# Patient Record
Sex: Male | Born: 1955
Health system: Southern US, Community
[De-identification: ages and names within clinical notes are randomized; demographics above are authoritative.]

## PROBLEM LIST (undated history)

## (undated) DIAGNOSIS — N289 Disorder of kidney and ureter, unspecified: Secondary | ICD-10-CM

## (undated) DIAGNOSIS — F32A Depression, unspecified: Secondary | ICD-10-CM

## (undated) DIAGNOSIS — N319 Neuromuscular dysfunction of bladder, unspecified: Secondary | ICD-10-CM

## (undated) DIAGNOSIS — H547 Unspecified visual loss: Secondary | ICD-10-CM

## (undated) DIAGNOSIS — I10 Essential (primary) hypertension: Secondary | ICD-10-CM

## (undated) DIAGNOSIS — G629 Polyneuropathy, unspecified: Secondary | ICD-10-CM

## (undated) DIAGNOSIS — E119 Type 2 diabetes mellitus without complications: Secondary | ICD-10-CM

## (undated) DIAGNOSIS — E785 Hyperlipidemia, unspecified: Secondary | ICD-10-CM

## (undated) DIAGNOSIS — J189 Pneumonia, unspecified organism: Secondary | ICD-10-CM

## (undated) DIAGNOSIS — N400 Enlarged prostate without lower urinary tract symptoms: Secondary | ICD-10-CM

---

## 2011-04-27 DIAGNOSIS — E119 Type 2 diabetes mellitus without complications: Secondary | ICD-10-CM | POA: Diagnosis not present

## 2011-05-04 DIAGNOSIS — I10 Essential (primary) hypertension: Secondary | ICD-10-CM | POA: Diagnosis not present

## 2011-05-04 DIAGNOSIS — E1165 Type 2 diabetes mellitus with hyperglycemia: Secondary | ICD-10-CM | POA: Diagnosis not present

## 2011-05-04 DIAGNOSIS — E785 Hyperlipidemia, unspecified: Secondary | ICD-10-CM | POA: Diagnosis not present

## 2011-05-04 DIAGNOSIS — E1129 Type 2 diabetes mellitus with other diabetic kidney complication: Secondary | ICD-10-CM | POA: Diagnosis not present

## 2011-08-30 DIAGNOSIS — E119 Type 2 diabetes mellitus without complications: Secondary | ICD-10-CM | POA: Diagnosis not present

## 2011-09-06 DIAGNOSIS — E1129 Type 2 diabetes mellitus with other diabetic kidney complication: Secondary | ICD-10-CM | POA: Diagnosis not present

## 2011-09-06 DIAGNOSIS — E785 Hyperlipidemia, unspecified: Secondary | ICD-10-CM | POA: Diagnosis not present

## 2011-09-06 DIAGNOSIS — I10 Essential (primary) hypertension: Secondary | ICD-10-CM | POA: Diagnosis not present

## 2011-09-06 DIAGNOSIS — F17200 Nicotine dependence, unspecified, uncomplicated: Secondary | ICD-10-CM | POA: Diagnosis not present

## 2012-01-31 DIAGNOSIS — Z0000 Encounter for general adult medical examination without abnormal findings: Secondary | ICD-10-CM | POA: Diagnosis not present

## 2012-01-31 DIAGNOSIS — Z23 Encounter for immunization: Secondary | ICD-10-CM | POA: Diagnosis not present

## 2012-01-31 DIAGNOSIS — Z1331 Encounter for screening for depression: Secondary | ICD-10-CM | POA: Diagnosis not present

## 2012-01-31 DIAGNOSIS — I10 Essential (primary) hypertension: Secondary | ICD-10-CM | POA: Diagnosis not present

## 2012-01-31 DIAGNOSIS — Z125 Encounter for screening for malignant neoplasm of prostate: Secondary | ICD-10-CM | POA: Diagnosis not present

## 2012-01-31 DIAGNOSIS — E1129 Type 2 diabetes mellitus with other diabetic kidney complication: Secondary | ICD-10-CM | POA: Diagnosis not present

## 2012-01-31 DIAGNOSIS — E785 Hyperlipidemia, unspecified: Secondary | ICD-10-CM | POA: Diagnosis not present

## 2012-01-31 DIAGNOSIS — E559 Vitamin D deficiency, unspecified: Secondary | ICD-10-CM | POA: Diagnosis not present

## 2012-02-07 DIAGNOSIS — I10 Essential (primary) hypertension: Secondary | ICD-10-CM | POA: Diagnosis not present

## 2012-02-07 DIAGNOSIS — N182 Chronic kidney disease, stage 2 (mild): Secondary | ICD-10-CM | POA: Diagnosis not present

## 2012-02-07 DIAGNOSIS — E785 Hyperlipidemia, unspecified: Secondary | ICD-10-CM | POA: Diagnosis not present

## 2012-02-07 DIAGNOSIS — E1129 Type 2 diabetes mellitus with other diabetic kidney complication: Secondary | ICD-10-CM | POA: Diagnosis not present

## 2012-08-28 DIAGNOSIS — E1129 Type 2 diabetes mellitus with other diabetic kidney complication: Secondary | ICD-10-CM | POA: Diagnosis not present

## 2012-08-28 DIAGNOSIS — E785 Hyperlipidemia, unspecified: Secondary | ICD-10-CM | POA: Diagnosis not present

## 2012-08-28 DIAGNOSIS — I10 Essential (primary) hypertension: Secondary | ICD-10-CM | POA: Diagnosis not present

## 2012-08-28 DIAGNOSIS — E559 Vitamin D deficiency, unspecified: Secondary | ICD-10-CM | POA: Diagnosis not present

## 2012-09-04 DIAGNOSIS — N182 Chronic kidney disease, stage 2 (mild): Secondary | ICD-10-CM | POA: Diagnosis not present

## 2012-09-04 DIAGNOSIS — E1129 Type 2 diabetes mellitus with other diabetic kidney complication: Secondary | ICD-10-CM | POA: Diagnosis not present

## 2012-09-04 DIAGNOSIS — I10 Essential (primary) hypertension: Secondary | ICD-10-CM | POA: Diagnosis not present

## 2012-09-04 DIAGNOSIS — E785 Hyperlipidemia, unspecified: Secondary | ICD-10-CM | POA: Diagnosis not present

## 2013-01-16 DIAGNOSIS — Z23 Encounter for immunization: Secondary | ICD-10-CM | POA: Diagnosis not present

## 2013-03-07 DIAGNOSIS — Z0000 Encounter for general adult medical examination without abnormal findings: Secondary | ICD-10-CM | POA: Diagnosis not present

## 2013-03-07 DIAGNOSIS — E559 Vitamin D deficiency, unspecified: Secondary | ICD-10-CM | POA: Diagnosis not present

## 2013-03-07 DIAGNOSIS — I10 Essential (primary) hypertension: Secondary | ICD-10-CM | POA: Diagnosis not present

## 2013-03-07 DIAGNOSIS — Z1331 Encounter for screening for depression: Secondary | ICD-10-CM | POA: Diagnosis not present

## 2013-03-07 DIAGNOSIS — E1129 Type 2 diabetes mellitus with other diabetic kidney complication: Secondary | ICD-10-CM | POA: Diagnosis not present

## 2013-03-14 DIAGNOSIS — E1129 Type 2 diabetes mellitus with other diabetic kidney complication: Secondary | ICD-10-CM | POA: Diagnosis not present

## 2013-03-14 DIAGNOSIS — H524 Presbyopia: Secondary | ICD-10-CM | POA: Diagnosis not present

## 2013-03-14 DIAGNOSIS — E785 Hyperlipidemia, unspecified: Secondary | ICD-10-CM | POA: Diagnosis not present

## 2013-03-14 DIAGNOSIS — N182 Chronic kidney disease, stage 2 (mild): Secondary | ICD-10-CM | POA: Diagnosis not present

## 2013-03-14 DIAGNOSIS — H52229 Regular astigmatism, unspecified eye: Secondary | ICD-10-CM | POA: Diagnosis not present

## 2013-03-14 DIAGNOSIS — I10 Essential (primary) hypertension: Secondary | ICD-10-CM | POA: Diagnosis not present

## 2013-03-14 DIAGNOSIS — H25099 Other age-related incipient cataract, unspecified eye: Secondary | ICD-10-CM | POA: Diagnosis not present

## 2013-03-14 DIAGNOSIS — H5200 Hypermetropia, unspecified eye: Secondary | ICD-10-CM | POA: Diagnosis not present

## 2013-06-12 DIAGNOSIS — E1129 Type 2 diabetes mellitus with other diabetic kidney complication: Secondary | ICD-10-CM | POA: Diagnosis not present

## 2013-06-12 DIAGNOSIS — E785 Hyperlipidemia, unspecified: Secondary | ICD-10-CM | POA: Diagnosis not present

## 2013-06-12 DIAGNOSIS — I10 Essential (primary) hypertension: Secondary | ICD-10-CM | POA: Diagnosis not present

## 2013-06-12 DIAGNOSIS — E1165 Type 2 diabetes mellitus with hyperglycemia: Secondary | ICD-10-CM | POA: Diagnosis not present

## 2013-06-12 DIAGNOSIS — Z125 Encounter for screening for malignant neoplasm of prostate: Secondary | ICD-10-CM | POA: Diagnosis not present

## 2013-06-19 DIAGNOSIS — E785 Hyperlipidemia, unspecified: Secondary | ICD-10-CM | POA: Diagnosis not present

## 2013-06-19 DIAGNOSIS — N182 Chronic kidney disease, stage 2 (mild): Secondary | ICD-10-CM | POA: Diagnosis not present

## 2013-06-19 DIAGNOSIS — E1129 Type 2 diabetes mellitus with other diabetic kidney complication: Secondary | ICD-10-CM | POA: Diagnosis not present

## 2013-06-19 DIAGNOSIS — I10 Essential (primary) hypertension: Secondary | ICD-10-CM | POA: Diagnosis not present

## 2013-09-19 DIAGNOSIS — E1129 Type 2 diabetes mellitus with other diabetic kidney complication: Secondary | ICD-10-CM | POA: Diagnosis not present

## 2013-09-19 DIAGNOSIS — I10 Essential (primary) hypertension: Secondary | ICD-10-CM | POA: Diagnosis not present

## 2013-09-19 DIAGNOSIS — E559 Vitamin D deficiency, unspecified: Secondary | ICD-10-CM | POA: Diagnosis not present

## 2013-09-19 DIAGNOSIS — E1165 Type 2 diabetes mellitus with hyperglycemia: Secondary | ICD-10-CM | POA: Diagnosis not present

## 2013-09-25 DIAGNOSIS — I10 Essential (primary) hypertension: Secondary | ICD-10-CM | POA: Diagnosis not present

## 2013-09-25 DIAGNOSIS — E1129 Type 2 diabetes mellitus with other diabetic kidney complication: Secondary | ICD-10-CM | POA: Diagnosis not present

## 2013-09-25 DIAGNOSIS — E785 Hyperlipidemia, unspecified: Secondary | ICD-10-CM | POA: Diagnosis not present

## 2013-09-25 DIAGNOSIS — N182 Chronic kidney disease, stage 2 (mild): Secondary | ICD-10-CM | POA: Diagnosis not present

## 2013-12-31 DIAGNOSIS — I10 Essential (primary) hypertension: Secondary | ICD-10-CM | POA: Diagnosis not present

## 2013-12-31 DIAGNOSIS — E1129 Type 2 diabetes mellitus with other diabetic kidney complication: Secondary | ICD-10-CM | POA: Diagnosis not present

## 2013-12-31 DIAGNOSIS — E559 Vitamin D deficiency, unspecified: Secondary | ICD-10-CM | POA: Diagnosis not present

## 2014-01-07 DIAGNOSIS — E1121 Type 2 diabetes mellitus with diabetic nephropathy: Secondary | ICD-10-CM | POA: Diagnosis not present

## 2014-01-07 DIAGNOSIS — I10 Essential (primary) hypertension: Secondary | ICD-10-CM | POA: Diagnosis not present

## 2014-01-07 DIAGNOSIS — E784 Other hyperlipidemia: Secondary | ICD-10-CM | POA: Diagnosis not present

## 2014-01-07 DIAGNOSIS — N182 Chronic kidney disease, stage 2 (mild): Secondary | ICD-10-CM | POA: Diagnosis not present

## 2014-01-07 DIAGNOSIS — Z23 Encounter for immunization: Secondary | ICD-10-CM | POA: Diagnosis not present

## 2014-04-07 DIAGNOSIS — Z0000 Encounter for general adult medical examination without abnormal findings: Secondary | ICD-10-CM | POA: Diagnosis not present

## 2014-04-07 DIAGNOSIS — E118 Type 2 diabetes mellitus with unspecified complications: Secondary | ICD-10-CM | POA: Diagnosis not present

## 2014-04-07 DIAGNOSIS — E1329 Other specified diabetes mellitus with other diabetic kidney complication: Secondary | ICD-10-CM | POA: Diagnosis not present

## 2014-04-07 DIAGNOSIS — Z1389 Encounter for screening for other disorder: Secondary | ICD-10-CM | POA: Diagnosis not present

## 2014-04-07 DIAGNOSIS — I10 Essential (primary) hypertension: Secondary | ICD-10-CM | POA: Diagnosis not present

## 2014-04-07 DIAGNOSIS — E559 Vitamin D deficiency, unspecified: Secondary | ICD-10-CM | POA: Diagnosis not present

## 2014-04-07 DIAGNOSIS — Z125 Encounter for screening for malignant neoplasm of prostate: Secondary | ICD-10-CM | POA: Diagnosis not present

## 2014-04-14 DIAGNOSIS — E1121 Type 2 diabetes mellitus with diabetic nephropathy: Secondary | ICD-10-CM | POA: Diagnosis not present

## 2014-04-14 DIAGNOSIS — E559 Vitamin D deficiency, unspecified: Secondary | ICD-10-CM | POA: Diagnosis not present

## 2014-04-14 DIAGNOSIS — E785 Hyperlipidemia, unspecified: Secondary | ICD-10-CM | POA: Diagnosis not present

## 2014-04-14 DIAGNOSIS — N182 Chronic kidney disease, stage 2 (mild): Secondary | ICD-10-CM | POA: Diagnosis not present

## 2014-04-14 DIAGNOSIS — I10 Essential (primary) hypertension: Secondary | ICD-10-CM | POA: Diagnosis not present

## 2014-07-14 DIAGNOSIS — I10 Essential (primary) hypertension: Secondary | ICD-10-CM | POA: Diagnosis not present

## 2014-07-14 DIAGNOSIS — E559 Vitamin D deficiency, unspecified: Secondary | ICD-10-CM | POA: Diagnosis not present

## 2014-07-14 DIAGNOSIS — E1121 Type 2 diabetes mellitus with diabetic nephropathy: Secondary | ICD-10-CM | POA: Diagnosis not present

## 2014-07-21 DIAGNOSIS — E1121 Type 2 diabetes mellitus with diabetic nephropathy: Secondary | ICD-10-CM | POA: Diagnosis not present

## 2014-07-21 DIAGNOSIS — N182 Chronic kidney disease, stage 2 (mild): Secondary | ICD-10-CM | POA: Diagnosis not present

## 2014-07-21 DIAGNOSIS — E785 Hyperlipidemia, unspecified: Secondary | ICD-10-CM | POA: Diagnosis not present

## 2014-07-21 DIAGNOSIS — I10 Essential (primary) hypertension: Secondary | ICD-10-CM | POA: Diagnosis not present

## 2015-01-19 DIAGNOSIS — E1121 Type 2 diabetes mellitus with diabetic nephropathy: Secondary | ICD-10-CM | POA: Diagnosis not present

## 2015-01-19 DIAGNOSIS — E785 Hyperlipidemia, unspecified: Secondary | ICD-10-CM | POA: Diagnosis not present

## 2015-01-19 DIAGNOSIS — I10 Essential (primary) hypertension: Secondary | ICD-10-CM | POA: Diagnosis not present

## 2015-01-19 DIAGNOSIS — E559 Vitamin D deficiency, unspecified: Secondary | ICD-10-CM | POA: Diagnosis not present

## 2015-01-26 DIAGNOSIS — E559 Vitamin D deficiency, unspecified: Secondary | ICD-10-CM | POA: Diagnosis not present

## 2015-01-26 DIAGNOSIS — E1122 Type 2 diabetes mellitus with diabetic chronic kidney disease: Secondary | ICD-10-CM | POA: Diagnosis not present

## 2015-01-26 DIAGNOSIS — N182 Chronic kidney disease, stage 2 (mild): Secondary | ICD-10-CM | POA: Diagnosis not present

## 2015-01-26 DIAGNOSIS — E785 Hyperlipidemia, unspecified: Secondary | ICD-10-CM | POA: Diagnosis not present

## 2015-05-13 DIAGNOSIS — E119 Type 2 diabetes mellitus without complications: Secondary | ICD-10-CM | POA: Diagnosis not present

## 2015-05-13 DIAGNOSIS — Z7984 Long term (current) use of oral hypoglycemic drugs: Secondary | ICD-10-CM | POA: Diagnosis not present

## 2015-05-13 DIAGNOSIS — H4720 Unspecified optic atrophy: Secondary | ICD-10-CM | POA: Diagnosis not present

## 2015-05-13 DIAGNOSIS — H524 Presbyopia: Secondary | ICD-10-CM | POA: Diagnosis not present

## 2015-05-13 DIAGNOSIS — H5203 Hypermetropia, bilateral: Secondary | ICD-10-CM | POA: Diagnosis not present

## 2015-05-13 DIAGNOSIS — H52223 Regular astigmatism, bilateral: Secondary | ICD-10-CM | POA: Diagnosis not present

## 2015-07-06 DIAGNOSIS — E785 Hyperlipidemia, unspecified: Secondary | ICD-10-CM | POA: Diagnosis not present

## 2015-07-06 DIAGNOSIS — Z125 Encounter for screening for malignant neoplasm of prostate: Secondary | ICD-10-CM | POA: Diagnosis not present

## 2015-07-06 DIAGNOSIS — Z23 Encounter for immunization: Secondary | ICD-10-CM | POA: Diagnosis not present

## 2015-07-06 DIAGNOSIS — E559 Vitamin D deficiency, unspecified: Secondary | ICD-10-CM | POA: Diagnosis not present

## 2015-07-06 DIAGNOSIS — Z1389 Encounter for screening for other disorder: Secondary | ICD-10-CM | POA: Diagnosis not present

## 2015-07-06 DIAGNOSIS — E1122 Type 2 diabetes mellitus with diabetic chronic kidney disease: Secondary | ICD-10-CM | POA: Diagnosis not present

## 2015-07-06 DIAGNOSIS — Z0000 Encounter for general adult medical examination without abnormal findings: Secondary | ICD-10-CM | POA: Diagnosis not present

## 2015-07-13 DIAGNOSIS — E785 Hyperlipidemia, unspecified: Secondary | ICD-10-CM | POA: Diagnosis not present

## 2015-07-13 DIAGNOSIS — E875 Hyperkalemia: Secondary | ICD-10-CM | POA: Diagnosis not present

## 2015-07-13 DIAGNOSIS — E559 Vitamin D deficiency, unspecified: Secondary | ICD-10-CM | POA: Diagnosis not present

## 2015-07-13 DIAGNOSIS — I129 Hypertensive chronic kidney disease with stage 1 through stage 4 chronic kidney disease, or unspecified chronic kidney disease: Secondary | ICD-10-CM | POA: Diagnosis not present

## 2015-07-13 DIAGNOSIS — N182 Chronic kidney disease, stage 2 (mild): Secondary | ICD-10-CM | POA: Diagnosis not present

## 2015-07-13 DIAGNOSIS — F17211 Nicotine dependence, cigarettes, in remission: Secondary | ICD-10-CM | POA: Diagnosis not present

## 2015-07-13 DIAGNOSIS — E1122 Type 2 diabetes mellitus with diabetic chronic kidney disease: Secondary | ICD-10-CM | POA: Diagnosis not present

## 2015-10-05 DIAGNOSIS — E559 Vitamin D deficiency, unspecified: Secondary | ICD-10-CM | POA: Diagnosis not present

## 2015-10-05 DIAGNOSIS — I129 Hypertensive chronic kidney disease with stage 1 through stage 4 chronic kidney disease, or unspecified chronic kidney disease: Secondary | ICD-10-CM | POA: Diagnosis not present

## 2015-10-05 DIAGNOSIS — E1122 Type 2 diabetes mellitus with diabetic chronic kidney disease: Secondary | ICD-10-CM | POA: Diagnosis not present

## 2015-10-12 DIAGNOSIS — I129 Hypertensive chronic kidney disease with stage 1 through stage 4 chronic kidney disease, or unspecified chronic kidney disease: Secondary | ICD-10-CM | POA: Diagnosis not present

## 2015-10-12 DIAGNOSIS — E785 Hyperlipidemia, unspecified: Secondary | ICD-10-CM | POA: Diagnosis not present

## 2015-10-12 DIAGNOSIS — E1122 Type 2 diabetes mellitus with diabetic chronic kidney disease: Secondary | ICD-10-CM | POA: Diagnosis not present

## 2015-10-12 DIAGNOSIS — N182 Chronic kidney disease, stage 2 (mild): Secondary | ICD-10-CM | POA: Diagnosis not present

## 2016-01-11 DIAGNOSIS — I10 Essential (primary) hypertension: Secondary | ICD-10-CM | POA: Diagnosis not present

## 2016-01-11 DIAGNOSIS — E1122 Type 2 diabetes mellitus with diabetic chronic kidney disease: Secondary | ICD-10-CM | POA: Diagnosis not present

## 2016-01-11 DIAGNOSIS — E559 Vitamin D deficiency, unspecified: Secondary | ICD-10-CM | POA: Diagnosis not present

## 2016-01-11 DIAGNOSIS — E785 Hyperlipidemia, unspecified: Secondary | ICD-10-CM | POA: Diagnosis not present

## 2016-01-11 DIAGNOSIS — I129 Hypertensive chronic kidney disease with stage 1 through stage 4 chronic kidney disease, or unspecified chronic kidney disease: Secondary | ICD-10-CM | POA: Diagnosis not present

## 2016-01-18 DIAGNOSIS — E559 Vitamin D deficiency, unspecified: Secondary | ICD-10-CM | POA: Diagnosis not present

## 2016-01-18 DIAGNOSIS — I129 Hypertensive chronic kidney disease with stage 1 through stage 4 chronic kidney disease, or unspecified chronic kidney disease: Secondary | ICD-10-CM | POA: Diagnosis not present

## 2016-01-18 DIAGNOSIS — E1122 Type 2 diabetes mellitus with diabetic chronic kidney disease: Secondary | ICD-10-CM | POA: Diagnosis not present

## 2016-01-18 DIAGNOSIS — Z23 Encounter for immunization: Secondary | ICD-10-CM | POA: Diagnosis not present

## 2016-04-12 DIAGNOSIS — E785 Hyperlipidemia, unspecified: Secondary | ICD-10-CM | POA: Diagnosis not present

## 2016-04-12 DIAGNOSIS — E1122 Type 2 diabetes mellitus with diabetic chronic kidney disease: Secondary | ICD-10-CM | POA: Diagnosis not present

## 2016-04-12 DIAGNOSIS — I10 Essential (primary) hypertension: Secondary | ICD-10-CM | POA: Diagnosis not present

## 2016-04-12 DIAGNOSIS — E559 Vitamin D deficiency, unspecified: Secondary | ICD-10-CM | POA: Diagnosis not present

## 2016-04-19 DIAGNOSIS — I10 Essential (primary) hypertension: Secondary | ICD-10-CM | POA: Diagnosis not present

## 2016-04-19 DIAGNOSIS — E875 Hyperkalemia: Secondary | ICD-10-CM | POA: Diagnosis not present

## 2016-04-19 DIAGNOSIS — E1165 Type 2 diabetes mellitus with hyperglycemia: Secondary | ICD-10-CM | POA: Diagnosis not present

## 2016-04-19 DIAGNOSIS — E7800 Pure hypercholesterolemia, unspecified: Secondary | ICD-10-CM | POA: Diagnosis not present

## 2016-08-23 DIAGNOSIS — E559 Vitamin D deficiency, unspecified: Secondary | ICD-10-CM | POA: Diagnosis not present

## 2016-08-23 DIAGNOSIS — I10 Essential (primary) hypertension: Secondary | ICD-10-CM | POA: Diagnosis not present

## 2016-08-23 DIAGNOSIS — E785 Hyperlipidemia, unspecified: Secondary | ICD-10-CM | POA: Diagnosis not present

## 2016-08-23 DIAGNOSIS — I129 Hypertensive chronic kidney disease with stage 1 through stage 4 chronic kidney disease, or unspecified chronic kidney disease: Secondary | ICD-10-CM | POA: Diagnosis not present

## 2016-08-23 DIAGNOSIS — E1122 Type 2 diabetes mellitus with diabetic chronic kidney disease: Secondary | ICD-10-CM | POA: Diagnosis not present

## 2016-08-31 DIAGNOSIS — E559 Vitamin D deficiency, unspecified: Secondary | ICD-10-CM | POA: Diagnosis not present

## 2016-08-31 DIAGNOSIS — R000 Tachycardia, unspecified: Secondary | ICD-10-CM | POA: Diagnosis not present

## 2016-08-31 DIAGNOSIS — I10 Essential (primary) hypertension: Secondary | ICD-10-CM | POA: Diagnosis not present

## 2016-08-31 DIAGNOSIS — E119 Type 2 diabetes mellitus without complications: Secondary | ICD-10-CM | POA: Diagnosis not present

## 2016-08-31 DIAGNOSIS — E7800 Pure hypercholesterolemia, unspecified: Secondary | ICD-10-CM | POA: Diagnosis not present

## 2017-01-10 DIAGNOSIS — Z23 Encounter for immunization: Secondary | ICD-10-CM | POA: Diagnosis not present

## 2017-02-23 DIAGNOSIS — E1122 Type 2 diabetes mellitus with diabetic chronic kidney disease: Secondary | ICD-10-CM | POA: Diagnosis not present

## 2017-02-23 DIAGNOSIS — E7800 Pure hypercholesterolemia, unspecified: Secondary | ICD-10-CM | POA: Diagnosis not present

## 2017-02-23 DIAGNOSIS — Z125 Encounter for screening for malignant neoplasm of prostate: Secondary | ICD-10-CM | POA: Diagnosis not present

## 2017-02-23 DIAGNOSIS — E119 Type 2 diabetes mellitus without complications: Secondary | ICD-10-CM | POA: Diagnosis not present

## 2017-03-02 DIAGNOSIS — I10 Essential (primary) hypertension: Secondary | ICD-10-CM | POA: Diagnosis not present

## 2017-03-02 DIAGNOSIS — E7800 Pure hypercholesterolemia, unspecified: Secondary | ICD-10-CM | POA: Diagnosis not present

## 2017-03-02 DIAGNOSIS — E119 Type 2 diabetes mellitus without complications: Secondary | ICD-10-CM | POA: Diagnosis not present

## 2017-03-02 DIAGNOSIS — E559 Vitamin D deficiency, unspecified: Secondary | ICD-10-CM | POA: Diagnosis not present

## 2017-08-28 DIAGNOSIS — E119 Type 2 diabetes mellitus without complications: Secondary | ICD-10-CM | POA: Diagnosis not present

## 2017-08-28 DIAGNOSIS — I10 Essential (primary) hypertension: Secondary | ICD-10-CM | POA: Diagnosis not present

## 2017-08-28 DIAGNOSIS — E1122 Type 2 diabetes mellitus with diabetic chronic kidney disease: Secondary | ICD-10-CM | POA: Diagnosis not present

## 2017-08-28 DIAGNOSIS — Z125 Encounter for screening for malignant neoplasm of prostate: Secondary | ICD-10-CM | POA: Diagnosis not present

## 2017-08-28 DIAGNOSIS — E785 Hyperlipidemia, unspecified: Secondary | ICD-10-CM | POA: Diagnosis not present

## 2017-08-31 DIAGNOSIS — E559 Vitamin D deficiency, unspecified: Secondary | ICD-10-CM | POA: Diagnosis not present

## 2017-08-31 DIAGNOSIS — I10 Essential (primary) hypertension: Secondary | ICD-10-CM | POA: Diagnosis not present

## 2017-08-31 DIAGNOSIS — N182 Chronic kidney disease, stage 2 (mild): Secondary | ICD-10-CM | POA: Diagnosis not present

## 2017-08-31 DIAGNOSIS — F17211 Nicotine dependence, cigarettes, in remission: Secondary | ICD-10-CM | POA: Diagnosis not present

## 2017-08-31 DIAGNOSIS — R430 Anosmia: Secondary | ICD-10-CM | POA: Diagnosis not present

## 2017-08-31 DIAGNOSIS — I129 Hypertensive chronic kidney disease with stage 1 through stage 4 chronic kidney disease, or unspecified chronic kidney disease: Secondary | ICD-10-CM | POA: Diagnosis not present

## 2017-08-31 DIAGNOSIS — E1122 Type 2 diabetes mellitus with diabetic chronic kidney disease: Secondary | ICD-10-CM | POA: Diagnosis not present

## 2017-08-31 DIAGNOSIS — E785 Hyperlipidemia, unspecified: Secondary | ICD-10-CM | POA: Diagnosis not present

## 2017-08-31 DIAGNOSIS — R000 Tachycardia, unspecified: Secondary | ICD-10-CM | POA: Diagnosis not present

## 2017-08-31 DIAGNOSIS — H4720 Unspecified optic atrophy: Secondary | ICD-10-CM | POA: Diagnosis not present

## 2017-08-31 DIAGNOSIS — J309 Allergic rhinitis, unspecified: Secondary | ICD-10-CM | POA: Diagnosis not present

## 2017-08-31 DIAGNOSIS — Z0000 Encounter for general adult medical examination without abnormal findings: Secondary | ICD-10-CM | POA: Diagnosis not present

## 2017-10-05 DIAGNOSIS — R350 Frequency of micturition: Secondary | ICD-10-CM | POA: Diagnosis not present

## 2017-10-05 DIAGNOSIS — N401 Enlarged prostate with lower urinary tract symptoms: Secondary | ICD-10-CM | POA: Diagnosis not present

## 2017-10-05 DIAGNOSIS — R972 Elevated prostate specific antigen [PSA]: Secondary | ICD-10-CM | POA: Diagnosis not present

## 2017-10-05 DIAGNOSIS — R3912 Poor urinary stream: Secondary | ICD-10-CM | POA: Diagnosis not present

## 2017-10-05 DIAGNOSIS — R3914 Feeling of incomplete bladder emptying: Secondary | ICD-10-CM | POA: Diagnosis not present

## 2017-11-22 DIAGNOSIS — R3912 Poor urinary stream: Secondary | ICD-10-CM | POA: Diagnosis not present

## 2017-11-22 DIAGNOSIS — R3914 Feeling of incomplete bladder emptying: Secondary | ICD-10-CM | POA: Diagnosis not present

## 2017-11-22 DIAGNOSIS — N401 Enlarged prostate with lower urinary tract symptoms: Secondary | ICD-10-CM | POA: Diagnosis not present

## 2017-11-23 DIAGNOSIS — E1122 Type 2 diabetes mellitus with diabetic chronic kidney disease: Secondary | ICD-10-CM | POA: Diagnosis not present

## 2017-11-23 DIAGNOSIS — I10 Essential (primary) hypertension: Secondary | ICD-10-CM | POA: Diagnosis not present

## 2017-11-30 DIAGNOSIS — Z23 Encounter for immunization: Secondary | ICD-10-CM | POA: Diagnosis not present

## 2017-11-30 DIAGNOSIS — E1122 Type 2 diabetes mellitus with diabetic chronic kidney disease: Secondary | ICD-10-CM | POA: Diagnosis not present

## 2018-03-08 DIAGNOSIS — E1122 Type 2 diabetes mellitus with diabetic chronic kidney disease: Secondary | ICD-10-CM | POA: Diagnosis not present

## 2018-03-08 DIAGNOSIS — E875 Hyperkalemia: Secondary | ICD-10-CM | POA: Diagnosis not present

## 2018-03-08 DIAGNOSIS — Z1159 Encounter for screening for other viral diseases: Secondary | ICD-10-CM | POA: Diagnosis not present

## 2018-03-08 DIAGNOSIS — I129 Hypertensive chronic kidney disease with stage 1 through stage 4 chronic kidney disease, or unspecified chronic kidney disease: Secondary | ICD-10-CM | POA: Diagnosis not present

## 2018-03-12 DIAGNOSIS — I129 Hypertensive chronic kidney disease with stage 1 through stage 4 chronic kidney disease, or unspecified chronic kidney disease: Secondary | ICD-10-CM | POA: Diagnosis not present

## 2018-03-12 DIAGNOSIS — E785 Hyperlipidemia, unspecified: Secondary | ICD-10-CM | POA: Diagnosis not present

## 2018-03-12 DIAGNOSIS — E1122 Type 2 diabetes mellitus with diabetic chronic kidney disease: Secondary | ICD-10-CM | POA: Diagnosis not present

## 2018-03-26 DIAGNOSIS — I129 Hypertensive chronic kidney disease with stage 1 through stage 4 chronic kidney disease, or unspecified chronic kidney disease: Secondary | ICD-10-CM | POA: Diagnosis not present

## 2018-03-26 DIAGNOSIS — E785 Hyperlipidemia, unspecified: Secondary | ICD-10-CM | POA: Diagnosis not present

## 2018-03-26 DIAGNOSIS — E1122 Type 2 diabetes mellitus with diabetic chronic kidney disease: Secondary | ICD-10-CM | POA: Diagnosis not present

## 2018-05-07 DIAGNOSIS — E785 Hyperlipidemia, unspecified: Secondary | ICD-10-CM | POA: Diagnosis not present

## 2018-05-07 DIAGNOSIS — K5900 Constipation, unspecified: Secondary | ICD-10-CM | POA: Diagnosis not present

## 2018-05-07 DIAGNOSIS — E1122 Type 2 diabetes mellitus with diabetic chronic kidney disease: Secondary | ICD-10-CM | POA: Diagnosis not present

## 2018-05-07 DIAGNOSIS — I129 Hypertensive chronic kidney disease with stage 1 through stage 4 chronic kidney disease, or unspecified chronic kidney disease: Secondary | ICD-10-CM | POA: Diagnosis not present

## 2018-10-26 ENCOUNTER — Other Ambulatory Visit: Payer: Self-pay

## 2018-12-21 DIAGNOSIS — Z23 Encounter for immunization: Secondary | ICD-10-CM | POA: Diagnosis not present

## 2019-01-08 DIAGNOSIS — I129 Hypertensive chronic kidney disease with stage 1 through stage 4 chronic kidney disease, or unspecified chronic kidney disease: Secondary | ICD-10-CM | POA: Diagnosis not present

## 2019-01-08 DIAGNOSIS — E559 Vitamin D deficiency, unspecified: Secondary | ICD-10-CM | POA: Diagnosis not present

## 2019-01-08 DIAGNOSIS — I10 Essential (primary) hypertension: Secondary | ICD-10-CM | POA: Diagnosis not present

## 2019-01-08 DIAGNOSIS — E1122 Type 2 diabetes mellitus with diabetic chronic kidney disease: Secondary | ICD-10-CM | POA: Diagnosis not present

## 2019-01-08 DIAGNOSIS — Z125 Encounter for screening for malignant neoplasm of prostate: Secondary | ICD-10-CM | POA: Diagnosis not present

## 2019-01-16 DIAGNOSIS — N400 Enlarged prostate without lower urinary tract symptoms: Secondary | ICD-10-CM | POA: Diagnosis not present

## 2019-01-16 DIAGNOSIS — E785 Hyperlipidemia, unspecified: Secondary | ICD-10-CM | POA: Diagnosis not present

## 2019-01-16 DIAGNOSIS — E1122 Type 2 diabetes mellitus with diabetic chronic kidney disease: Secondary | ICD-10-CM | POA: Diagnosis not present

## 2019-01-16 DIAGNOSIS — Z23 Encounter for immunization: Secondary | ICD-10-CM | POA: Diagnosis not present

## 2019-01-16 DIAGNOSIS — E1140 Type 2 diabetes mellitus with diabetic neuropathy, unspecified: Secondary | ICD-10-CM | POA: Diagnosis not present

## 2019-01-16 DIAGNOSIS — I10 Essential (primary) hypertension: Secondary | ICD-10-CM | POA: Diagnosis not present

## 2019-01-16 DIAGNOSIS — R000 Tachycardia, unspecified: Secondary | ICD-10-CM | POA: Diagnosis not present

## 2019-01-16 DIAGNOSIS — H4720 Unspecified optic atrophy: Secondary | ICD-10-CM | POA: Diagnosis not present

## 2019-01-16 DIAGNOSIS — R972 Elevated prostate specific antigen [PSA]: Secondary | ICD-10-CM | POA: Diagnosis not present

## 2019-01-16 DIAGNOSIS — Z0000 Encounter for general adult medical examination without abnormal findings: Secondary | ICD-10-CM | POA: Diagnosis not present

## 2019-01-16 DIAGNOSIS — Z7189 Other specified counseling: Secondary | ICD-10-CM | POA: Diagnosis not present

## 2019-05-19 ENCOUNTER — Ambulatory Visit: Payer: Medicare Other | Attending: Internal Medicine

## 2019-05-19 DIAGNOSIS — Z23 Encounter for immunization: Secondary | ICD-10-CM | POA: Insufficient documentation

## 2019-05-19 NOTE — Progress Notes (Signed)
   Covid-19 Vaccination Clinic  Name:  Dustin Duffy    MRN: [id number] DOB: [birth year]  05/19/2019  [name] was observed post Covid-19 immunization for 15 minutes without incidence. He was provided with Vaccine Information Sheet and instruction to access the V-Safe system.   [name] was instructed to call 911 with any severe reactions post vaccine: Marland Kitchen Difficulty breathing  . Swelling of your face and throat  . A fast heartbeat  . A bad rash all over your body  . Dizziness and weakness    Immunizations Administered    Name Date Dose VIS Date Route   Pfizer COVID-19 Vaccine 05/19/2019  1:37 PM 0.3 mL 03/08/2019 Intramuscular   Manufacturer: [place]   Lot: [id number]   [place]: [id number]

## 2019-06-12 ENCOUNTER — Ambulatory Visit: Payer: Medicare Other | Attending: Internal Medicine

## 2019-06-12 DIAGNOSIS — Z23 Encounter for immunization: Secondary | ICD-10-CM

## 2019-06-12 NOTE — Progress Notes (Signed)
   Covid-19 Vaccination Clinic  Name:  Dustin Duffy    MRN: [id number] DOB: 09/19/1955  06/12/2019  Mr. Dustin Duffy was observed post Covid-19 immunization for 15 minutes without incident. He was provided with Vaccine Information Sheet and instruction to access the V-Safe system.   Mr. Dustin Duffy was instructed to call 911 with any severe reactions post vaccine: Marland Kitchen Difficulty breathing  . Swelling of face and throat  . A fast heartbeat  . A bad rash all over body  . Dizziness and weakness   Immunizations Administered    Name Date Dose VIS Date Route   Pfizer COVID-19 Vaccine 06/12/2019 10:16 AM 0.3 mL 03/08/2019 Intramuscular   Manufacturer: [place]   Lot: [id number]   [place]: [id number]

## 2019-07-10 DIAGNOSIS — E1122 Type 2 diabetes mellitus with diabetic chronic kidney disease: Secondary | ICD-10-CM | POA: Diagnosis not present

## 2019-07-17 DIAGNOSIS — N400 Enlarged prostate without lower urinary tract symptoms: Secondary | ICD-10-CM | POA: Diagnosis not present

## 2019-07-17 DIAGNOSIS — R000 Tachycardia, unspecified: Secondary | ICD-10-CM | POA: Diagnosis not present

## 2019-07-17 DIAGNOSIS — E1140 Type 2 diabetes mellitus with diabetic neuropathy, unspecified: Secondary | ICD-10-CM | POA: Diagnosis not present

## 2019-07-17 DIAGNOSIS — I129 Hypertensive chronic kidney disease with stage 1 through stage 4 chronic kidney disease, or unspecified chronic kidney disease: Secondary | ICD-10-CM | POA: Diagnosis not present

## 2019-07-17 DIAGNOSIS — E1122 Type 2 diabetes mellitus with diabetic chronic kidney disease: Secondary | ICD-10-CM | POA: Diagnosis not present

## 2019-10-10 DIAGNOSIS — E1122 Type 2 diabetes mellitus with diabetic chronic kidney disease: Secondary | ICD-10-CM | POA: Diagnosis not present

## 2019-10-17 DIAGNOSIS — H4720 Unspecified optic atrophy: Secondary | ICD-10-CM | POA: Diagnosis not present

## 2019-10-17 DIAGNOSIS — R635 Abnormal weight gain: Secondary | ICD-10-CM | POA: Diagnosis not present

## 2019-10-17 DIAGNOSIS — I10 Essential (primary) hypertension: Secondary | ICD-10-CM | POA: Diagnosis not present

## 2019-10-17 DIAGNOSIS — E1122 Type 2 diabetes mellitus with diabetic chronic kidney disease: Secondary | ICD-10-CM | POA: Diagnosis not present

## 2019-10-17 DIAGNOSIS — E1140 Type 2 diabetes mellitus with diabetic neuropathy, unspecified: Secondary | ICD-10-CM | POA: Diagnosis not present

## 2019-10-17 DIAGNOSIS — R000 Tachycardia, unspecified: Secondary | ICD-10-CM | POA: Diagnosis not present

## 2019-10-17 DIAGNOSIS — Z0000 Encounter for general adult medical examination without abnormal findings: Secondary | ICD-10-CM | POA: Diagnosis not present

## 2019-10-17 DIAGNOSIS — K5900 Constipation, unspecified: Secondary | ICD-10-CM | POA: Diagnosis not present

## 2019-10-17 DIAGNOSIS — N400 Enlarged prostate without lower urinary tract symptoms: Secondary | ICD-10-CM | POA: Diagnosis not present

## 2019-12-25 DIAGNOSIS — Z23 Encounter for immunization: Secondary | ICD-10-CM | POA: Diagnosis not present

## 2020-01-13 DIAGNOSIS — Z0000 Encounter for general adult medical examination without abnormal findings: Secondary | ICD-10-CM | POA: Diagnosis not present

## 2020-01-13 DIAGNOSIS — I10 Essential (primary) hypertension: Secondary | ICD-10-CM | POA: Diagnosis not present

## 2020-01-13 DIAGNOSIS — Z125 Encounter for screening for malignant neoplasm of prostate: Secondary | ICD-10-CM | POA: Diagnosis not present

## 2020-01-13 DIAGNOSIS — E785 Hyperlipidemia, unspecified: Secondary | ICD-10-CM | POA: Diagnosis not present

## 2020-01-13 DIAGNOSIS — E1122 Type 2 diabetes mellitus with diabetic chronic kidney disease: Secondary | ICD-10-CM | POA: Diagnosis not present

## 2020-01-13 DIAGNOSIS — R635 Abnormal weight gain: Secondary | ICD-10-CM | POA: Diagnosis not present

## 2020-01-20 DIAGNOSIS — H4720 Unspecified optic atrophy: Secondary | ICD-10-CM | POA: Diagnosis not present

## 2020-01-20 DIAGNOSIS — R000 Tachycardia, unspecified: Secondary | ICD-10-CM | POA: Diagnosis not present

## 2020-01-20 DIAGNOSIS — E1122 Type 2 diabetes mellitus with diabetic chronic kidney disease: Secondary | ICD-10-CM | POA: Diagnosis not present

## 2020-01-20 DIAGNOSIS — E785 Hyperlipidemia, unspecified: Secondary | ICD-10-CM | POA: Diagnosis not present

## 2020-01-20 DIAGNOSIS — N400 Enlarged prostate without lower urinary tract symptoms: Secondary | ICD-10-CM | POA: Diagnosis not present

## 2020-01-20 DIAGNOSIS — Z0000 Encounter for general adult medical examination without abnormal findings: Secondary | ICD-10-CM | POA: Diagnosis not present

## 2020-01-20 DIAGNOSIS — E1140 Type 2 diabetes mellitus with diabetic neuropathy, unspecified: Secondary | ICD-10-CM | POA: Diagnosis not present

## 2020-01-20 DIAGNOSIS — R972 Elevated prostate specific antigen [PSA]: Secondary | ICD-10-CM | POA: Diagnosis not present

## 2020-01-20 DIAGNOSIS — E875 Hyperkalemia: Secondary | ICD-10-CM | POA: Diagnosis not present

## 2020-01-20 DIAGNOSIS — K5900 Constipation, unspecified: Secondary | ICD-10-CM | POA: Diagnosis not present

## 2020-02-04 DIAGNOSIS — E875 Hyperkalemia: Secondary | ICD-10-CM | POA: Diagnosis not present

## 2020-04-16 DIAGNOSIS — R972 Elevated prostate specific antigen [PSA]: Secondary | ICD-10-CM | POA: Diagnosis not present

## 2020-04-16 DIAGNOSIS — E875 Hyperkalemia: Secondary | ICD-10-CM | POA: Diagnosis not present

## 2020-04-16 DIAGNOSIS — E1122 Type 2 diabetes mellitus with diabetic chronic kidney disease: Secondary | ICD-10-CM | POA: Diagnosis not present

## 2020-04-21 DIAGNOSIS — R972 Elevated prostate specific antigen [PSA]: Secondary | ICD-10-CM | POA: Diagnosis not present

## 2020-04-21 DIAGNOSIS — I129 Hypertensive chronic kidney disease with stage 1 through stage 4 chronic kidney disease, or unspecified chronic kidney disease: Secondary | ICD-10-CM | POA: Diagnosis not present

## 2020-04-21 DIAGNOSIS — H548 Legal blindness, as defined in USA: Secondary | ICD-10-CM | POA: Diagnosis not present

## 2020-04-21 DIAGNOSIS — N400 Enlarged prostate without lower urinary tract symptoms: Secondary | ICD-10-CM | POA: Diagnosis not present

## 2020-04-21 DIAGNOSIS — H4720 Unspecified optic atrophy: Secondary | ICD-10-CM | POA: Diagnosis not present

## 2020-04-21 DIAGNOSIS — E785 Hyperlipidemia, unspecified: Secondary | ICD-10-CM | POA: Diagnosis not present

## 2020-04-21 DIAGNOSIS — E1140 Type 2 diabetes mellitus with diabetic neuropathy, unspecified: Secondary | ICD-10-CM | POA: Diagnosis not present

## 2020-06-29 DIAGNOSIS — Z23 Encounter for immunization: Secondary | ICD-10-CM | POA: Diagnosis not present

## 2020-07-21 DIAGNOSIS — E7800 Pure hypercholesterolemia, unspecified: Secondary | ICD-10-CM | POA: Diagnosis not present

## 2020-07-21 DIAGNOSIS — E1122 Type 2 diabetes mellitus with diabetic chronic kidney disease: Secondary | ICD-10-CM | POA: Diagnosis not present

## 2020-07-21 DIAGNOSIS — R972 Elevated prostate specific antigen [PSA]: Secondary | ICD-10-CM | POA: Diagnosis not present

## 2020-07-21 DIAGNOSIS — R635 Abnormal weight gain: Secondary | ICD-10-CM | POA: Diagnosis not present

## 2020-07-21 DIAGNOSIS — Z125 Encounter for screening for malignant neoplasm of prostate: Secondary | ICD-10-CM | POA: Diagnosis not present

## 2020-07-21 DIAGNOSIS — E785 Hyperlipidemia, unspecified: Secondary | ICD-10-CM | POA: Diagnosis not present

## 2020-07-21 DIAGNOSIS — E875 Hyperkalemia: Secondary | ICD-10-CM | POA: Diagnosis not present

## 2020-07-28 DIAGNOSIS — R635 Abnormal weight gain: Secondary | ICD-10-CM | POA: Diagnosis not present

## 2020-07-28 DIAGNOSIS — E1122 Type 2 diabetes mellitus with diabetic chronic kidney disease: Secondary | ICD-10-CM | POA: Diagnosis not present

## 2020-07-28 DIAGNOSIS — I129 Hypertensive chronic kidney disease with stage 1 through stage 4 chronic kidney disease, or unspecified chronic kidney disease: Secondary | ICD-10-CM | POA: Diagnosis not present

## 2020-07-28 DIAGNOSIS — R972 Elevated prostate specific antigen [PSA]: Secondary | ICD-10-CM | POA: Diagnosis not present

## 2020-07-28 DIAGNOSIS — E1140 Type 2 diabetes mellitus with diabetic neuropathy, unspecified: Secondary | ICD-10-CM | POA: Diagnosis not present

## 2020-07-28 DIAGNOSIS — N401 Enlarged prostate with lower urinary tract symptoms: Secondary | ICD-10-CM | POA: Diagnosis not present

## 2020-07-28 DIAGNOSIS — E785 Hyperlipidemia, unspecified: Secondary | ICD-10-CM | POA: Diagnosis not present

## 2020-12-17 DIAGNOSIS — Z23 Encounter for immunization: Secondary | ICD-10-CM | POA: Diagnosis not present

## 2021-01-28 DIAGNOSIS — Z125 Encounter for screening for malignant neoplasm of prostate: Secondary | ICD-10-CM | POA: Diagnosis not present

## 2021-01-28 DIAGNOSIS — E1122 Type 2 diabetes mellitus with diabetic chronic kidney disease: Secondary | ICD-10-CM | POA: Diagnosis not present

## 2021-01-28 DIAGNOSIS — I129 Hypertensive chronic kidney disease with stage 1 through stage 4 chronic kidney disease, or unspecified chronic kidney disease: Secondary | ICD-10-CM | POA: Diagnosis not present

## 2021-01-28 DIAGNOSIS — R635 Abnormal weight gain: Secondary | ICD-10-CM | POA: Diagnosis not present

## 2021-01-28 DIAGNOSIS — R972 Elevated prostate specific antigen [PSA]: Secondary | ICD-10-CM | POA: Diagnosis not present

## 2021-02-04 DIAGNOSIS — E785 Hyperlipidemia, unspecified: Secondary | ICD-10-CM | POA: Diagnosis not present

## 2021-02-04 DIAGNOSIS — E1122 Type 2 diabetes mellitus with diabetic chronic kidney disease: Secondary | ICD-10-CM | POA: Diagnosis not present

## 2021-02-04 DIAGNOSIS — N1831 Chronic kidney disease, stage 3a: Secondary | ICD-10-CM | POA: Diagnosis not present

## 2021-02-04 DIAGNOSIS — I129 Hypertensive chronic kidney disease with stage 1 through stage 4 chronic kidney disease, or unspecified chronic kidney disease: Secondary | ICD-10-CM | POA: Diagnosis not present

## 2021-02-04 DIAGNOSIS — G5711 Meralgia paresthetica, right lower limb: Secondary | ICD-10-CM | POA: Diagnosis not present

## 2021-02-04 DIAGNOSIS — Z0000 Encounter for general adult medical examination without abnormal findings: Secondary | ICD-10-CM | POA: Diagnosis not present

## 2021-02-04 DIAGNOSIS — N401 Enlarged prostate with lower urinary tract symptoms: Secondary | ICD-10-CM | POA: Diagnosis not present

## 2021-02-04 DIAGNOSIS — R296 Repeated falls: Secondary | ICD-10-CM | POA: Diagnosis not present

## 2021-02-04 DIAGNOSIS — H4720 Unspecified optic atrophy: Secondary | ICD-10-CM | POA: Diagnosis not present

## 2021-02-04 DIAGNOSIS — E1140 Type 2 diabetes mellitus with diabetic neuropathy, unspecified: Secondary | ICD-10-CM | POA: Diagnosis not present

## 2021-02-04 DIAGNOSIS — H548 Legal blindness, as defined in USA: Secondary | ICD-10-CM | POA: Diagnosis not present

## 2021-02-04 DIAGNOSIS — R000 Tachycardia, unspecified: Secondary | ICD-10-CM | POA: Diagnosis not present

## 2021-02-09 ENCOUNTER — Inpatient Hospital Stay (HOSPITAL_COMMUNITY)
Admission: EM | Admit: 2021-02-09 | Discharge: 2021-02-15 | DRG: 558 | Disposition: A | Discharge status: Skilled Nursing Facility | Payer: Medicare Other | Attending: Internal Medicine | Admitting: Internal Medicine

## 2021-02-09 ENCOUNTER — Emergency Department (HOSPITAL_COMMUNITY): Payer: Medicare Other

## 2021-02-09 ENCOUNTER — Encounter (HOSPITAL_COMMUNITY): Payer: Self-pay

## 2021-02-09 ENCOUNTER — Other Ambulatory Visit: Payer: Self-pay

## 2021-02-09 DIAGNOSIS — E119 Type 2 diabetes mellitus without complications: Secondary | ICD-10-CM | POA: Diagnosis not present

## 2021-02-09 DIAGNOSIS — N320 Bladder-neck obstruction: Secondary | ICD-10-CM | POA: Diagnosis present

## 2021-02-09 DIAGNOSIS — J90 Pleural effusion, not elsewhere classified: Secondary | ICD-10-CM | POA: Diagnosis not present

## 2021-02-09 DIAGNOSIS — N400 Enlarged prostate without lower urinary tract symptoms: Secondary | ICD-10-CM | POA: Diagnosis present

## 2021-02-09 DIAGNOSIS — E860 Dehydration: Secondary | ICD-10-CM | POA: Diagnosis not present

## 2021-02-09 DIAGNOSIS — E785 Hyperlipidemia, unspecified: Secondary | ICD-10-CM | POA: Diagnosis present

## 2021-02-09 DIAGNOSIS — H547 Unspecified visual loss: Secondary | ICD-10-CM

## 2021-02-09 DIAGNOSIS — I10 Essential (primary) hypertension: Secondary | ICD-10-CM | POA: Diagnosis not present

## 2021-02-09 DIAGNOSIS — N289 Disorder of kidney and ureter, unspecified: Secondary | ICD-10-CM

## 2021-02-09 DIAGNOSIS — N318 Other neuromuscular dysfunction of bladder: Secondary | ICD-10-CM | POA: Diagnosis present

## 2021-02-09 DIAGNOSIS — I959 Hypotension, unspecified: Secondary | ICD-10-CM | POA: Diagnosis present

## 2021-02-09 DIAGNOSIS — R296 Repeated falls: Secondary | ICD-10-CM | POA: Diagnosis present

## 2021-02-09 DIAGNOSIS — N189 Chronic kidney disease, unspecified: Secondary | ICD-10-CM | POA: Diagnosis not present

## 2021-02-09 DIAGNOSIS — R000 Tachycardia, unspecified: Secondary | ICD-10-CM | POA: Diagnosis present

## 2021-02-09 DIAGNOSIS — R739 Hyperglycemia, unspecified: Secondary | ICD-10-CM | POA: Diagnosis not present

## 2021-02-09 DIAGNOSIS — R0689 Other abnormalities of breathing: Secondary | ICD-10-CM | POA: Diagnosis not present

## 2021-02-09 DIAGNOSIS — N323 Diverticulum of bladder: Secondary | ICD-10-CM | POA: Diagnosis present

## 2021-02-09 DIAGNOSIS — Z79899 Other long term (current) drug therapy: Secondary | ICD-10-CM | POA: Diagnosis not present

## 2021-02-09 DIAGNOSIS — I129 Hypertensive chronic kidney disease with stage 1 through stage 4 chronic kidney disease, or unspecified chronic kidney disease: Secondary | ICD-10-CM | POA: Diagnosis present

## 2021-02-09 DIAGNOSIS — Z743 Need for continuous supervision: Secondary | ICD-10-CM | POA: Diagnosis not present

## 2021-02-09 DIAGNOSIS — Z87891 Personal history of nicotine dependence: Secondary | ICD-10-CM

## 2021-02-09 DIAGNOSIS — N401 Enlarged prostate with lower urinary tract symptoms: Secondary | ICD-10-CM | POA: Diagnosis present

## 2021-02-09 DIAGNOSIS — R7989 Other specified abnormal findings of blood chemistry: Secondary | ICD-10-CM | POA: Diagnosis present

## 2021-02-09 DIAGNOSIS — I5030 Unspecified diastolic (congestive) heart failure: Secondary | ICD-10-CM | POA: Diagnosis not present

## 2021-02-09 DIAGNOSIS — N181 Chronic kidney disease, stage 1: Secondary | ICD-10-CM | POA: Diagnosis present

## 2021-02-09 DIAGNOSIS — R531 Weakness: Secondary | ICD-10-CM | POA: Diagnosis present

## 2021-02-09 DIAGNOSIS — Z9181 History of falling: Secondary | ICD-10-CM | POA: Diagnosis not present

## 2021-02-09 DIAGNOSIS — Z7982 Long term (current) use of aspirin: Secondary | ICD-10-CM | POA: Diagnosis not present

## 2021-02-09 DIAGNOSIS — M6282 Rhabdomyolysis: Secondary | ICD-10-CM | POA: Diagnosis present

## 2021-02-09 DIAGNOSIS — Z20822 Contact with and (suspected) exposure to covid-19: Secondary | ICD-10-CM | POA: Diagnosis present

## 2021-02-09 DIAGNOSIS — R231 Pallor: Secondary | ICD-10-CM | POA: Diagnosis not present

## 2021-02-09 DIAGNOSIS — G4700 Insomnia, unspecified: Secondary | ICD-10-CM | POA: Diagnosis not present

## 2021-02-09 DIAGNOSIS — J9811 Atelectasis: Secondary | ICD-10-CM | POA: Diagnosis not present

## 2021-02-09 DIAGNOSIS — R0602 Shortness of breath: Secondary | ICD-10-CM | POA: Diagnosis not present

## 2021-02-09 DIAGNOSIS — Z66 Do not resuscitate: Secondary | ICD-10-CM | POA: Diagnosis present

## 2021-02-09 DIAGNOSIS — E1122 Type 2 diabetes mellitus with diabetic chronic kidney disease: Secondary | ICD-10-CM | POA: Diagnosis present

## 2021-02-09 DIAGNOSIS — R2689 Other abnormalities of gait and mobility: Secondary | ICD-10-CM | POA: Diagnosis not present

## 2021-02-09 DIAGNOSIS — F32A Depression, unspecified: Secondary | ICD-10-CM | POA: Diagnosis not present

## 2021-02-09 DIAGNOSIS — H548 Legal blindness, as defined in USA: Secondary | ICD-10-CM | POA: Diagnosis present

## 2021-02-09 DIAGNOSIS — S0990XA Unspecified injury of head, initial encounter: Secondary | ICD-10-CM | POA: Diagnosis not present

## 2021-02-09 DIAGNOSIS — R41841 Cognitive communication deficit: Secondary | ICD-10-CM | POA: Diagnosis not present

## 2021-02-09 DIAGNOSIS — N179 Acute kidney failure, unspecified: Secondary | ICD-10-CM | POA: Diagnosis present

## 2021-02-09 DIAGNOSIS — R2681 Unsteadiness on feet: Secondary | ICD-10-CM | POA: Diagnosis not present

## 2021-02-09 DIAGNOSIS — M6281 Muscle weakness (generalized): Secondary | ICD-10-CM | POA: Diagnosis not present

## 2021-02-09 HISTORY — DX: Unspecified visual loss: H54.7

## 2021-02-09 HISTORY — DX: Hyperlipidemia, unspecified: E78.5

## 2021-02-09 HISTORY — DX: Disorder of kidney and ureter, unspecified: N28.9

## 2021-02-09 HISTORY — DX: Essential (primary) hypertension: I10

## 2021-02-09 HISTORY — DX: Benign prostatic hyperplasia without lower urinary tract symptoms: N40.0

## 2021-02-09 HISTORY — DX: Type 2 diabetes mellitus without complications: E11.9

## 2021-02-09 LAB — COMPREHENSIVE METABOLIC PANEL
ALT: 63 U/L — ABNORMAL HIGH (ref 0–44)
AST: 188 U/L — ABNORMAL HIGH (ref 15–41)
Albumin: 4 g/dL (ref 3.5–5.0)
Alkaline Phosphatase: 63 U/L (ref 38–126)
Anion gap: 13 (ref 5–15)
BUN: 65 mg/dL — ABNORMAL HIGH (ref 8–23)
CO2: 20 mmol/L — ABNORMAL LOW (ref 22–32)
Calcium: 8.8 mg/dL — ABNORMAL LOW (ref 8.9–10.3)
Chloride: 102 mmol/L (ref 98–111)
Creatinine, Ser: 2.49 mg/dL — ABNORMAL HIGH (ref 0.61–1.24)
GFR, Estimated: 28 mL/min — ABNORMAL LOW (ref >60)
Glucose, Bld: 253 mg/dL — ABNORMAL HIGH (ref 70–99)
Potassium: 4.7 mmol/L (ref 3.5–5.1)
Sodium: 135 mmol/L (ref 135–145)
Total Bilirubin: 1.8 mg/dL — ABNORMAL HIGH (ref 0.3–1.2)
Total Protein: 7.3 g/dL (ref 6.5–8.1)

## 2021-02-09 LAB — CBC WITH DIFFERENTIAL/PLATELET
Abs Immature Granulocytes: 0.04 10*3/uL (ref 0.00–0.07)
Basophils Absolute: 0.1 10*3/uL (ref 0.0–0.1)
Basophils Relative: 1 %
Eosinophils Absolute: 0.1 10*3/uL (ref 0.0–0.5)
Eosinophils Relative: 1 %
HCT: 44.9 % (ref 39.0–52.0)
Hemoglobin: 14.6 g/dL (ref 13.0–17.0)
Immature Granulocytes: 1 %
Lymphocytes Relative: 12 %
Lymphs Abs: 1 10*3/uL (ref 0.7–4.0)
MCH: 29.3 pg (ref 26.0–34.0)
MCHC: 32.5 g/dL (ref 30.0–36.0)
MCV: 90 fL (ref 80.0–100.0)
Monocytes Absolute: 0.9 10*3/uL (ref 0.1–1.0)
Monocytes Relative: 11 %
Neutro Abs: 6 10*3/uL (ref 1.7–7.7)
Neutrophils Relative %: 74 %
Platelets: 228 10*3/uL (ref 150–400)
RBC: 4.99 MIL/uL (ref 4.22–5.81)
RDW: 14.1 % (ref 11.5–15.5)
WBC: 8 10*3/uL (ref 4.0–10.5)
nRBC: 0 % (ref 0.0–0.2)

## 2021-02-09 LAB — LACTIC ACID, PLASMA: Lactic Acid, Venous: 1.7 mmol/L (ref 0.5–1.9)

## 2021-02-09 LAB — RESP PANEL BY RT-PCR (FLU A&B, COVID) ARPGX2
Influenza A by PCR: NEGATIVE
Influenza B by PCR: NEGATIVE
SARS Coronavirus 2 by RT PCR: NEGATIVE

## 2021-02-09 LAB — CBG MONITORING, ED: Glucose-Capillary: 244 mg/dL — ABNORMAL HIGH (ref 70–99)

## 2021-02-09 LAB — CK: Total CK: 19190 U/L — ABNORMAL HIGH (ref 49–397)

## 2021-02-09 LAB — HEMOGLOBIN A1C
Hgb A1c MFr Bld: 12.6 % — ABNORMAL HIGH (ref 4.8–5.6)
Mean Plasma Glucose: 314.92 mg/dL

## 2021-02-09 LAB — HIV ANTIBODY (ROUTINE TESTING W REFLEX): HIV Screen 4th Generation wRfx: NONREACTIVE

## 2021-02-09 LAB — GLUCOSE, CAPILLARY: Glucose-Capillary: 299 mg/dL — ABNORMAL HIGH (ref 70–99)

## 2021-02-09 MED ORDER — ACETAMINOPHEN 325 MG PO TABS
650.0000 mg | ORAL_TABLET | Freq: Four times a day (QID) | ORAL | Status: DC | PRN
  Administered 2021-02-12 – 2021-02-15 (×6): 650 mg via ORAL
  Filled 2021-02-09 (×6): qty 2

## 2021-02-09 MED ORDER — INSULIN ASPART 100 UNIT/ML IJ SOLN
0.0000 [IU] | Freq: Every day | INTRAMUSCULAR | Status: DC
  Administered 2021-02-09 – 2021-02-10 (×2): 3 [IU] via SUBCUTANEOUS
  Administered 2021-02-11: 22:00:00 2 [IU] via SUBCUTANEOUS
  Administered 2021-02-12: 3 [IU] via SUBCUTANEOUS
  Administered 2021-02-13: 4 [IU] via SUBCUTANEOUS
  Filled 2021-02-09: qty 0.05

## 2021-02-09 MED ORDER — ENOXAPARIN SODIUM 30 MG/0.3ML IJ SOSY
30.0000 mg | PREFILLED_SYRINGE | INTRAMUSCULAR | Status: DC
  Administered 2021-02-09: 30 mg via SUBCUTANEOUS
  Filled 2021-02-09 (×2): qty 0.3

## 2021-02-09 MED ORDER — METHOCARBAMOL 1000 MG/10ML IJ SOLN
500.0000 mg | Freq: Four times a day (QID) | INTRAVENOUS | Status: DC | PRN
  Filled 2021-02-09: qty 5

## 2021-02-09 MED ORDER — LACTATED RINGERS IV BOLUS
1000.0000 mL | Freq: Once | INTRAVENOUS | Status: AC
  Administered 2021-02-09: 1000 mL via INTRAVENOUS

## 2021-02-09 MED ORDER — GABAPENTIN 300 MG PO CAPS
300.0000 mg | ORAL_CAPSULE | Freq: Three times a day (TID) | ORAL | Status: DC
  Administered 2021-02-09 – 2021-02-15 (×18): 300 mg via ORAL
  Filled 2021-02-09 (×18): qty 1

## 2021-02-09 MED ORDER — LACTATED RINGERS IV SOLN: INTRAVENOUS | Status: DC

## 2021-02-09 MED ORDER — ONDANSETRON HCL 4 MG/2ML IJ SOLN: 4.0000 mg | Freq: Four times a day (QID) | INTRAMUSCULAR | Status: DC | PRN

## 2021-02-09 MED ORDER — OXYCODONE HCL 5 MG PO TABS
5.0000 mg | ORAL_TABLET | ORAL | Status: DC | PRN
  Administered 2021-02-11: 18:00:00 5 mg via ORAL
  Filled 2021-02-09 (×2): qty 1

## 2021-02-09 MED ORDER — INSULIN ASPART 100 UNIT/ML IJ SOLN
0.0000 [IU] | Freq: Three times a day (TID) | INTRAMUSCULAR | Status: DC
  Administered 2021-02-10: 5 [IU] via SUBCUTANEOUS
  Administered 2021-02-10 – 2021-02-11 (×3): 3 [IU] via SUBCUTANEOUS
  Administered 2021-02-11 (×2): 5 [IU] via SUBCUTANEOUS
  Administered 2021-02-12 – 2021-02-13 (×5): 3 [IU] via SUBCUTANEOUS
  Administered 2021-02-13: 5 [IU] via SUBCUTANEOUS
  Administered 2021-02-14 – 2021-02-15 (×4): 3 [IU] via SUBCUTANEOUS
  Filled 2021-02-09: qty 0.09

## 2021-02-09 MED ORDER — TAMSULOSIN HCL 0.4 MG PO CAPS
0.4000 mg | ORAL_CAPSULE | Freq: Two times a day (BID) | ORAL | Status: DC
  Administered 2021-02-09 – 2021-02-15 (×12): 0.4 mg via ORAL
  Filled 2021-02-09 (×15): qty 1

## 2021-02-09 MED ORDER — ACETAMINOPHEN 650 MG RE SUPP: 650.0000 mg | Freq: Four times a day (QID) | RECTAL | Status: DC | PRN

## 2021-02-09 MED ORDER — ONDANSETRON HCL 4 MG PO TABS: 4.0000 mg | ORAL_TABLET | Freq: Four times a day (QID) | ORAL | Status: DC | PRN

## 2021-02-09 MED ORDER — SODIUM CHLORIDE 0.9% FLUSH
3.0000 mL | Freq: Two times a day (BID) | INTRAVENOUS | Status: DC
  Administered 2021-02-09 – 2021-02-15 (×12): 3 mL via INTRAVENOUS

## 2021-02-09 MED ORDER — ASPIRIN EC 81 MG PO TBEC
81.0000 mg | DELAYED_RELEASE_TABLET | Freq: Every day | ORAL | Status: DC
  Administered 2021-02-09 – 2021-02-15 (×7): 81 mg via ORAL
  Filled 2021-02-09 (×7): qty 1

## 2021-02-09 MED ORDER — HYDRALAZINE HCL 20 MG/ML IJ SOLN: 5.0000 mg | INTRAMUSCULAR | Status: DC | PRN

## 2021-02-09 MED ORDER — AMITRIPTYLINE HCL 25 MG PO TABS
50.0000 mg | ORAL_TABLET | Freq: Every day | ORAL | Status: DC
  Administered 2021-02-09 – 2021-02-14 (×6): 50 mg via ORAL
  Filled 2021-02-09 (×6): qty 2

## 2021-02-09 NOTE — Assessment & Plan Note (Addendum)
-  CK 19190 -Likely related to recurrent falls with baseline limited physical activity, exacerbated by ACE -Will admit with aggressive IVF at 200 cc/hour -Hepatic function panel, CK, and BMP with Mag/Phos q12h -Myoglobin (blood and urine) ordered -CBC qAM -Lovenox for DVT prophylaxis -Push PO fluids if able -Strict I/Os -Vital signs q4h -Hepatic and renal injuries are usually reversible with aggressive rehydration but will need ongoing monitoring -Monitor on telemetry

## 2021-02-09 NOTE — H&P (Signed)
History and Physical    Dustin Duffy TMA:[id number] DOB: [birth year] DOA: 02/09/2021  PCP: Janie Morning, DO Consultants:  None Patient coming from:  Home - lives alone; NOK: [place], [place], [phone]  Chief Complaint: Weakness, falls  HPI: Dustin Duffy is a 65 y.o. male with medical history significant of DM; BPH; HLD; HTN; and CKD presenting with weakness, falls.  He reports that he has been having problems with his R leg, had a remote injury.  It has been hurting a lot in the last few years.  He developed DM a few years ago and he has lost more of his vision.  Both of his legs have been hurting the last few days and he hasn't been able to walk.  He fell several times yesterday.  He fell again last night and it took him a couple of hours to get to the living room and then laid half on/off the sofa for the next few hours.  He managed to stand this AM and walked to the bathroom and bedroom and his legs gave out again.  He started a new DM medication Sunday and he didn't eat at all Monday.   He denies drinking ETOH - last drank in 2008.    ED Course: AKI, rhabdo.  Added head CT due to frequent falls.  CK 19k, elevated BUN/creatinine.  Review of Systems: As per HPI; otherwise review of systems reviewed and negative.   Ambulatory Status:  Ambulates with a cane or walker outside of the home  COVID Vaccine Status:   Complete plus 3 booster  Past Medical History:  Diagnosis Date   BPH without urinary obstruction    Diabetes mellitus type 2 in nonobese (HCC)    Dyslipidemia    Essential hypertension    Renal impairment    Visual impairment     History reviewed. No pertinent surgical history.  Social History   Socioeconomic History   Marital status: Divorced    Spouse name: Not on file   Number of children: Not on file   Years of education: Not on file   Highest education level: Not on file  Occupational History   Occupation: disabled  Tobacco Use   Smoking status: Former     Packs/day: 1.00    Years: 35.00    Pack years: 35.00    Types: Cigarettes    Quit date: 2012    Years since quitting: 10.8   Smokeless tobacco: Never  Substance and Sexual Activity   Alcohol use: Not on file    Comment: heavy use, quit in 2008   Drug use: Not Currently   Sexual activity: Not on file  Other Topics Concern   Not on file  Social History Narrative   Not on file   Social Determinants of Health   Financial Resource Strain: Not on file  Food Insecurity: Not on file  Transportation Needs: Not on file  Physical Activity: Not on file  Stress: Not on file  Social Connections: Not on file  Intimate Partner Violence: Not on file    No Known Allergies  History reviewed. No pertinent family history.  Prior to Admission medications   Medication Sig Start Date End Date Taking? Authorizing Provider  amitriptyline (ELAVIL) 50 MG tablet Take 50 mg by mouth at bedtime. 01/06/21  Yes [provider]  aspirin EC 81 MG tablet Take 81 mg by mouth daily. Swallow whole.   Yes [provider]  cholecalciferol (VITAMIN D3) 25 MCG (1000 UNIT) tablet  Take 1,000 Units by mouth 3 (three) times a week.   Yes [provider]  gabapentin (NEURONTIN) 300 MG capsule Take 300 mg by mouth 3 (three) times daily. 02/05/21  Yes [provider]  lisinopril (ZESTRIL) 20 MG tablet Take 20 mg by mouth daily. 02/03/21  Yes [provider]  simvastatin (ZOCOR) 20 MG tablet Take 20 mg by mouth every evening. 01/13/21  Yes [provider]  tamsulosin (FLOMAX) 0.4 MG CAPS capsule Take 0.4 mg by mouth 2 (two) times daily. 01/06/21  Yes [provider]  tirzepatide Darcel Bayley) 2.5 MG/0.5ML Pen Inject 2.5 mg into the skin once a week. Sunday   Yes [provider]    Physical Exam: Vitals:   02/09/21 1330 02/09/21 1345 02/09/21 1430 02/09/21 1630  BP: 119/73  (!) 130/94 138/85  Pulse: 84 (!) 109 (!) 107 (!) 106  Resp: (!) 21 19 14 15    Temp:      TempSrc:      SpO2: (!) 84% 93% 95% 93%  Weight:      Height:         General:  Appears calm and comfortable and is in NAD Eyes:  normal lids, iris, does not track or fix gaze ENT:  grossly normal hearing, lips & tongue, mildly dry mm; poor dentition Neck:  no LAD, masses or thyromegaly Cardiovascular:  RR with mild tachycardia, no m/r/g. No LE edema.  Respiratory:   CTA bilaterally with no wheezes/rales/rhonchi.  Normal respiratory effort. Abdomen:  soft, NT, ND Skin:  no rash or induration seen on limited exam Musculoskeletal:  grossly normal tone BUE/BLE, good ROM, no bony abnormality, mild diffuse TTP of extremities Psychiatric:  blunted mood and affect, speech fluent and appropriate, AOx3 Neurologic:  CN 2-12 grossly intact, moves all extremities in coordinated fashion    Radiological Exams on Admission: Independently reviewed - see discussion in A/P where applicable  CT Head Wo Contrast  Result Date: 02/09/2021 CLINICAL DATA:  Head trauma, mod-severe EXAM: CT HEAD WITHOUT CONTRAST TECHNIQUE: Contiguous axial images were obtained from the base of the skull through the vertex without intravenous contrast. COMPARISON:  None. FINDINGS: Brain: There is no acute intracranial hemorrhage, mass effect, or edema. Gray-white differentiation is preserved. There is no extra-axial fluid collection. Prominence of ventricles and sulci reflects minor parenchymal volume loss. Vascular: There is atherosclerotic calcification at the skull base. Skull: Calvarium is unremarkable. Sinuses/Orbits: No acute finding. Other: None. IMPRESSION: No evidence of acute intracranial injury. Electronically Signed   By: Macy Mis M.D.   On: 02/09/2021 14:33   DG Chest Port 1 View  Result Date: 02/09/2021 CLINICAL DATA:  Shortness of breath, weakness EXAM: PORTABLE CHEST 1 VIEW COMPARISON:  None. FINDINGS: The cardiomediastinal silhouette is normal. There is asymmetric elevation of the right  hemidiaphragm. Linear opacities in the left base may reflect atelectasis or scar. Otherwise, there is no focal consolidation or pulmonary edema. There is no pleural effusion or pneumothorax. There is no acute osseous abnormality. IMPRESSION: Mild left basilar atelectasis and/or scar. Otherwise, no radiographic evidence of acute cardiopulmonary process. Electronically Signed   By: Valetta Mole M.D.   On: 02/09/2021 12:37    EKG: Independently reviewed.  Sinus tachycardia with rate 112; RBBB with nonspecific ST changes, no comparison available    Labs on Admission: I have personally reviewed the available labs and imaging studies at the time of the admission.  Pertinent labs:   Glucose 253 BUN 65/Creatinine 2.49/GFR 28 AST 188/ALT 63/Bili  1.8 CK 19190 Lactate 1.7 Normal CBC COVID/flu negative   Assessment/Plan Principal Problem:   Non-traumatic rhabdomyolysis Active Problems:   Renal impairment   BPH without urinary obstruction   Diabetes mellitus type 2 in nonobese (HCC)   Dyslipidemia   Essential hypertension   Visual impairment    * Non-traumatic rhabdomyolysis -CK 19190 -Likely related to recurrent falls with baseline limited physical activity, exacerbated by ACE -Will admit with aggressive IVF at 200 cc/hour -Hepatic function panel, CK, and BMP with Mag/Phos q12h -Myoglobin (blood and urine) ordered -CBC qAM -Lovenox for DVT prophylaxis -Push PO fluids if able -Strict I/Os -Vital signs q4h -Hepatic and renal injuries are usually reversible with aggressive rehydration but will need ongoing monitoring -Monitor on telemetry  Renal impairment -Uncertain baseline, reportedly has CKD  -Will trend with hydration -Resume ACE if renal function will support  Visual impairment -Legally blind  Essential hypertension -Hold lisinopril -Will cover with prn IV hydralazine  Dyslipidemia -Hold Zocor given elevated LFTs (likely associated with rhabdo, but will  follow)  Diabetes mellitus type 2 in nonobese (HCC) -Uncertain if the new medication (tirzepatide) is related to his current presentation - but this is a consideration -Will check A1c -For now, will cover with sensitive-scale SSI  BPH without urinary obstruction -Continue flomax -If patient is unable to void, he may need foley placement      Note: This patient has been tested and is negative for the novel coronavirus COVID-19. The patient has been fully vaccinated against COVID-19.   Level of care: Telemetry DVT prophylaxis:  Lovenox  Code Status: DNR - confirmed with patient Family Communication: None present; he did not request that I call his sister at the time of admission. Disposition Plan:  The patient is from: home  Anticipated d/c is to: home without [facility] services  Anticipated d/c date will depend on clinical response to treatment, likely 2-3 days  Patient is currently: acutely ill Consults called: None  Admission status:  Admit - It is my clinical opinion that admission to [facility] is reasonable and necessary because of the expectation that this patient will require hospital care that crosses at least 2 midnights to treat this condition based on the medical complexity of the problems presented.  Given the aforementioned information, the predictability of an adverse outcome is felt to be significant.    Karmen Bongo MD [facility]   How to contact the [facility] Attending or Consulting provider [place] or covering provider during after hours [place], for this patient?  Check the care team in [facility] and look for a) attending/consulting [facility] provider listed and b) the [facility] team listed Log into [email or web address] and use [place]'s universal password to access. If you do not have the password, please contact the hospital operator. Locate the [facility] provider you are looking for under [facility] and page to a number that you can be directly reached. If you still have difficulty  reaching the provider, please page the [facility] (Director on Call) for the Hospitalists listed on amion for assistance.   02/09/2021, 5:18 PM

## 2021-02-09 NOTE — Assessment & Plan Note (Signed)
-  Continue flomax -If patient is unable to void, he may need foley placement

## 2021-02-09 NOTE — ED Triage Notes (Signed)
BIBA Per EMS:  Pt coming from home w/ c/o weakness x a week & multiple falls  Denies thinners ; Poor appetite lately Denies N/V/D ; Denies urinary issues  74J systolic originally  159 ccs given en route  120/60 now  90% RA - 2L 96%  110 HR  289 CBG  30 RR

## 2021-02-09 NOTE — Assessment & Plan Note (Signed)
-  Hold Zocor given elevated LFTs (likely associated with rhabdo, but will follow)

## 2021-02-09 NOTE — Assessment & Plan Note (Signed)
-  Uncertain baseline, reportedly has CKD  -Will trend with hydration -Resume ACE if renal function will support

## 2021-02-09 NOTE — Assessment & Plan Note (Signed)
-  Uncertain if the new medication (tirzepatide) is related to his current presentation - but this is a consideration -Will check A1c -For now, will cover with sensitive-scale SSI

## 2021-02-09 NOTE — Plan of Care (Signed)
  Problem: Education: Goal: Knowledge of General Education information will improve Description: Including pain rating scale, medication(s)/side effects and non-pharmacologic comfort measures Outcome: Progressing   Problem: Health Behavior/Discharge Planning: Goal: Ability to manage health-related needs will improve Outcome: Progressing   Problem: Clinical Measurements: Goal: Ability to maintain clinical measurements within normal limits will improve Outcome: Progressing Goal: Will remain free from infection Outcome: Progressing Goal: Diagnostic test results will improve Outcome: Progressing Goal: Cardiovascular complication will be avoided Outcome: Progressing   Problem: Activity: Goal: Risk for activity intolerance will decrease Outcome: Progressing   Problem: Nutrition: Goal: Adequate nutrition will be maintained Outcome: Progressing   Problem: Pain Managment: Goal: General experience of comfort will improve Outcome: Progressing   Problem: Safety: Goal: Ability to remain free from injury will improve Outcome: Progressing   Problem: Skin Integrity: Goal: Risk for impaired skin integrity will decrease Outcome: Progressing

## 2021-02-09 NOTE — Assessment & Plan Note (Signed)
Legally blind 

## 2021-02-09 NOTE — Assessment & Plan Note (Signed)
-  Hold lisinopril -Will cover with prn IV hydralazine

## 2021-02-09 NOTE — ED Provider Notes (Signed)
[facility] DEPT Provider Note   CSN: [id number] Arrival date & time: 02/09/21  1055     History Chief Complaint  Patient presents with   Weakness    Dustin Duffy is a 65 y.o. male.  65 year old male presents due to whole body weakness worse in his lower extremities.  History of same in the past.  States that he uses a cane and walker at times to ambulate.  Notes that his symptoms of his lower extremity get worse when he tries to walk.  No bowel or bladder dysfunction.  No new weakness in his upper extremities.  Denies any headache or neck discomfort.  No reported fever.  No chest pain or shortness of breath.  No abdominal discomfort.  Did start a new diabetes medication recently.  EMS was called and patient was slightly hypotensive initially and responded well to fluids.      History reviewed. No pertinent past medical history.  There are no problems to display for this patient.   History reviewed. No pertinent surgical history.     History reviewed. No pertinent family history.     Home Medications Prior to Admission medications   Not on File    Allergies    Patient has no allergy information on record.  Review of Systems   Review of Systems  All other systems reviewed and are negative.  Physical Exam Updated Vital Signs BP (!) 153/87 (BP Location: Left Arm)   Pulse (!) 111   Temp 98.2 F (36.8 C) (Oral)   Resp (!) 27   Ht 1.74 m (5' 8.5")   Wt 70.8 kg   SpO2 96%   BMI 23.37 kg/m   Physical Exam Vitals and nursing note reviewed.  Constitutional:      General: He is not in acute distress.    Appearance: Normal appearance. He is well-developed. He is not toxic-appearing.  HENT:     Head: Normocephalic and atraumatic.  Eyes:     General: Lids are normal.     Conjunctiva/sclera: Conjunctivae normal.     Pupils: Pupils are equal, round, and reactive to light.  Neck:     Thyroid: No thyroid mass.     Trachea: No  tracheal deviation.  Cardiovascular:     Rate and Rhythm: Regular rhythm. Tachycardia present.     Heart sounds: Normal heart sounds. No murmur heard.   No gallop.  Pulmonary:     Effort: Pulmonary effort is normal. No respiratory distress.     Breath sounds: Normal breath sounds. No stridor. No decreased breath sounds, wheezing, rhonchi or rales.  Abdominal:     General: There is no distension.     Palpations: Abdomen is soft.     Tenderness: There is no abdominal tenderness. There is no rebound.  Musculoskeletal:        General: No tenderness. Normal range of motion.     Cervical back: Normal range of motion and neck supple.  Skin:    General: Skin is warm and dry.     Findings: No abrasion or rash.  Neurological:     Mental Status: He is alert and oriented to person, place, and time. Mental status is at baseline.     GCS: GCS eye subscore is 4. GCS verbal subscore is 5. GCS motor subscore is 6.     Cranial Nerves: No cranial nerve deficit.     Sensory: No sensory deficit.     Motor: Motor function is intact. No  atrophy.     Comments: Strength is 5 of 5 in upper as well as lower extremities.  Psychiatric:        Attention and Perception: Attention normal.        Speech: Speech normal.        Behavior: Behavior normal.    ED Results / Procedures / Treatments   Labs (all labs ordered are listed, but only abnormal results are displayed) Labs Reviewed  CBG MONITORING, ED - Abnormal; Notable for the following components:      Result Value   Glucose-Capillary 244 (*)    All other components within normal limits  RESP PANEL BY RT-PCR (FLU A&B, COVID) [id number]  CBC WITH DIFFERENTIAL/PLATELET  COMPREHENSIVE METABOLIC PANEL  URINALYSIS, ROUTINE W REFLEX MICROSCOPIC  LACTIC ACID, PLASMA  CK    EKG EKG Interpretation  Date/Time:  Tuesday February 09 2021 11:14:09 EST Ventricular Rate:  112 PR Interval:  180 QRS Duration: 135 QT Interval:  339 QTC Calculation: 463 R  Axis:   96 Text Interpretation: Sinus tachycardia Right bundle branch block Minimal ST elevation, inferior leads Confirmed by Lacretia Leigh ([id number]) on 02/09/2021 11:29:01 AM  Radiology No results found.  Procedures Procedures   Medications Ordered in ED Medications  lactated ringers bolus 1,000 mL (has no administration in time range)  lactated ringers infusion (has no administration in time range)    ED Course  I have reviewed the triage vital signs and the nursing notes.  Pertinent labs & imaging results that were available during my care of the patient were reviewed by me and considered in my medical decision making (see chart for details).    MDM Rules/Calculators/A&P                           Patient given IV hydration here and has evidence of acute kidney injury with creatinine of 2.46.  He denies any prior history of chronic kidney disease.  Patient CK is over 19,000 and likely some component of rhabdomyolysis.  Patient does note he has been having increasing falls recently without any head injury.  We will add head CT.  Plan will be for hospitalist admission Final Clinical Impression(s) / ED Diagnoses Final diagnoses:  None    Rx / DC Orders ED Discharge Orders     None        Lacretia Leigh, MD 02/09/21 1359

## 2021-02-10 DIAGNOSIS — I10 Essential (primary) hypertension: Secondary | ICD-10-CM

## 2021-02-10 DIAGNOSIS — E119 Type 2 diabetes mellitus without complications: Secondary | ICD-10-CM

## 2021-02-10 LAB — CBC WITH DIFFERENTIAL/PLATELET
Abs Immature Granulocytes: 0.09 10*3/uL — ABNORMAL HIGH (ref 0.00–0.07)
Basophils Absolute: 0.1 10*3/uL (ref 0.0–0.1)
Basophils Relative: 1 %
Eosinophils Absolute: 0.1 10*3/uL (ref 0.0–0.5)
Eosinophils Relative: 1 %
HCT: 38.2 % — ABNORMAL LOW (ref 39.0–52.0)
Hemoglobin: 12.7 g/dL — ABNORMAL LOW (ref 13.0–17.0)
Immature Granulocytes: 1 %
Lymphocytes Relative: 15 %
Lymphs Abs: 1.2 10*3/uL (ref 0.7–4.0)
MCH: 29.6 pg (ref 26.0–34.0)
MCHC: 33.2 g/dL (ref 30.0–36.0)
MCV: 89 fL (ref 80.0–100.0)
Monocytes Absolute: 0.9 10*3/uL (ref 0.1–1.0)
Monocytes Relative: 12 %
Neutro Abs: 5.5 10*3/uL (ref 1.7–7.7)
Neutrophils Relative %: 70 %
Platelets: 211 10*3/uL (ref 150–400)
RBC: 4.29 MIL/uL (ref 4.22–5.81)
RDW: 14.3 % (ref 11.5–15.5)
WBC: 7.8 10*3/uL (ref 4.0–10.5)
nRBC: 0 % (ref 0.0–0.2)

## 2021-02-10 LAB — COMPREHENSIVE METABOLIC PANEL
ALT: 48 U/L — ABNORMAL HIGH (ref 0–44)
ALT: 51 U/L — ABNORMAL HIGH (ref 0–44)
AST: 97 U/L — ABNORMAL HIGH (ref 15–41)
AST: 76 U/L — ABNORMAL HIGH (ref 15–41)
Albumin: 2.8 g/dL — ABNORMAL LOW (ref 3.5–5.0)
Albumin: 2.9 g/dL — ABNORMAL LOW (ref 3.5–5.0)
Alkaline Phosphatase: 49 U/L (ref 38–126)
Alkaline Phosphatase: 53 U/L (ref 38–126)
Anion gap: 7 (ref 5–15)
Anion gap: 8 (ref 5–15)
BUN: 54 mg/dL — ABNORMAL HIGH (ref 8–23)
BUN: 44 mg/dL — ABNORMAL HIGH (ref 8–23)
CO2: 22 mmol/L (ref 22–32)
CO2: 23 mmol/L (ref 22–32)
Calcium: 8.8 mg/dL — ABNORMAL LOW (ref 8.9–10.3)
Calcium: 8.8 mg/dL — ABNORMAL LOW (ref 8.9–10.3)
Chloride: 108 mmol/L (ref 98–111)
Chloride: 105 mmol/L (ref 98–111)
Creatinine, Ser: 1.88 mg/dL — ABNORMAL HIGH (ref 0.61–1.24)
Creatinine, Ser: 1.98 mg/dL — ABNORMAL HIGH (ref 0.61–1.24)
GFR, Estimated: 39 mL/min — ABNORMAL LOW (ref >60)
GFR, Estimated: 36 mL/min — ABNORMAL LOW (ref >60)
Glucose, Bld: 226 mg/dL — ABNORMAL HIGH (ref 70–99)
Glucose, Bld: 267 mg/dL — ABNORMAL HIGH (ref 70–99)
Potassium: 4.8 mmol/L (ref 3.5–5.1)
Potassium: 5.7 mmol/L — ABNORMAL HIGH (ref 3.5–5.1)
Sodium: 137 mmol/L (ref 135–145)
Sodium: 136 mmol/L (ref 135–145)
Total Bilirubin: 1.2 mg/dL (ref 0.3–1.2)
Total Bilirubin: 1.4 mg/dL — ABNORMAL HIGH (ref 0.3–1.2)
Total Protein: 5.2 g/dL — ABNORMAL LOW (ref 6.5–8.1)
Total Protein: 6 g/dL — ABNORMAL LOW (ref 6.5–8.1)

## 2021-02-10 LAB — GLUCOSE, CAPILLARY
Glucose-Capillary: 217 mg/dL — ABNORMAL HIGH (ref 70–99)
Glucose-Capillary: 280 mg/dL — ABNORMAL HIGH (ref 70–99)
Glucose-Capillary: 228 mg/dL — ABNORMAL HIGH (ref 70–99)
Glucose-Capillary: 252 mg/dL — ABNORMAL HIGH (ref 70–99)

## 2021-02-10 LAB — MAGNESIUM
Magnesium: 2 mg/dL (ref 1.7–2.4)
Magnesium: 2 mg/dL (ref 1.7–2.4)

## 2021-02-10 LAB — PHOSPHORUS
Phosphorus: 3.5 mg/dL (ref 2.5–4.6)
Phosphorus: 3.2 mg/dL (ref 2.5–4.6)

## 2021-02-10 MED ORDER — SODIUM ZIRCONIUM CYCLOSILICATE 5 G PO PACK
5.0000 g | PACK | Freq: Once | ORAL | Status: AC
  Administered 2021-02-10: 5 g via ORAL
  Filled 2021-02-10 (×2): qty 1

## 2021-02-10 MED ORDER — ENOXAPARIN SODIUM 40 MG/0.4ML IJ SOSY
40.0000 mg | PREFILLED_SYRINGE | INTRAMUSCULAR | Status: DC
  Administered 2021-02-10 – 2021-02-14 (×5): 40 mg via SUBCUTANEOUS
  Filled 2021-02-10 (×5): qty 0.4

## 2021-02-10 NOTE — Progress Notes (Signed)
   02/10/21 1500  Assess: MEWS Score  Temp 98.2 F (36.8 C)  BP 123/78  Pulse Rate (!) 116  Resp 20  SpO2 92 %  O2 Device Room Air  Assess: MEWS Score  MEWS Temp 0  MEWS Systolic 0  MEWS Pulse 2  MEWS RR 0  MEWS LOC 0  MEWS Score 2  MEWS Score Color Yellow  Assess: if the MEWS score is Yellow or Red  Were vital signs taken at a resting state? Yes  Focused Assessment Change from prior assessment (see assessment flowsheet)  Does the patient meet 2 or more of the SIRS criteria? No  MEWS guidelines implemented *See Row Information* Yes  Treat  MEWS Interventions Escalated (See documentation below)  Pain Scale 0-10  Pain Score 0  Take Vital Signs  Increase Vital Sign Frequency  Yellow: Q 2hr X 2 then Q 4hr X 2, if remains yellow, continue Q 4hrs  Escalate  MEWS: Escalate Yellow: discuss with charge nurse/RN and consider discussing with provider and RRT  Notify: Charge Nurse/RN  Name of Charge Nurse/RN Notified Pamala Hurry RN  Date Charge Nurse/RN Notified 02/10/21  Time Charge Nurse/RN Notified 1500  Notify: Provider  Provider Name/Title [facility]  Date Provider Notified 02/10/21  Time Provider Notified 1500  Notification Type Page  Notification Reason Change in status  Provider response No new orders  Date of Provider Response 02/10/21  Document  Progress note created (see row info) Yes  Assess: SIRS CRITERIA  SIRS Temperature  0  SIRS Pulse 1  SIRS Respirations  0  SIRS WBC 0  SIRS Score Sum  1   Pt noted to be in yellow mews. Both MD and charge nurse notified.

## 2021-02-10 NOTE — Progress Notes (Signed)
PROGRESS NOTE    Dustin Duffy  LDJ:[id number] DOB: [birth year] DOA: 02/09/2021 PCP: Janie Morning, DO    Brief Narrative:  65 y.o. male with medical history significant of DM; BPH; HLD; HTN; and CKD presenting with weakness, falls.  He reports that he has been having problems with his R leg, had a remote injury.  It has been hurting a lot in the last few years.  He developed DM a few years ago and he has lost more of his vision.  Both of his legs have been hurting the last few days and he hasn't been able to walk.  He fell several times yesterday.  He fell again last night and it took him a couple of hours to get to the living room and then laid half on/off the sofa for the next few hours.  He managed to stand this AM and walked to the bathroom and bedroom and his legs gave out again.  He started a new DM medication Sunday and he didn't eat at all Monday.   He denies drinking ETOH   Assessment & Plan:   Principal Problem:   Non-traumatic rhabdomyolysis Active Problems:   BPH without urinary obstruction   Diabetes mellitus type 2 in nonobese (HCC)   Dyslipidemia   Essential hypertension   Renal impairment   Visual impairment   * Non-traumatic rhabdomyolysis -[place] [address] with Cr 2.49 -Likely related to recurrent falls with baseline limited physical activity, exacerbated by ACE -Renal function improved with IVF -repeat CK and renal function in AM   Renal impairment -Unclear baseline -Cont IVF as tolerated -Resume ACE if renal function will support   Visual impairment -Legally blind   Essential hypertension -Hold lisinopril -Will cover with prn IV hydralazine   Dyslipidemia -Hold Zocor given elevated LFTs (likely associated with rhabdo, but will follow)   Diabetes mellitus type 2 in nonobese (HCC) -A1c of 12.6 suggesting very poor control -Continued on sensitive-scale SSI -Discussed with Diabetic Coordinator. Pt is legally blind -On oral hyperglycemic meds prior to  admit, likely suboptimal regimen -Recommendation to f/u closely with Endocrine   BPH without urinary obstruction -Continue flomax   DVT prophylaxis: Lovenox subq Code Status: DNR Family Communication: Pt in room, family not at bedside  Status is: Inpatient  Remains inpatient appropriate because: Severity of illness requiring IVF   Consultants:    Procedures:    Antimicrobials: Anti-infectives (From admission, onward)    None       Subjective: Reports feeling somewhat better. Still very weak  Objective: Vitals:   02/09/21 1945 02/09/21 2258 02/10/21 0337 02/10/21 0558  BP: 133/88 113/79 111/69 121/74  Pulse: (!) 110 100 98 100  Resp: 18 20 20 20   Temp: 98 F (36.7 C) 98.4 F (36.9 C) 97.9 F (36.6 C) 98 F (36.7 C)  TempSrc: Oral Axillary Axillary Axillary  SpO2: 95% 93% 95% 92%  Weight:      Height:        Intake/Output Summary (Last 24 hours) at 02/10/2021 1413 Last data filed at 02/10/2021 1025 Gross per 24 hour  Intake 360 ml  Output 200 ml  Net 160 ml   Filed Weights   02/09/21 1112  Weight: 70.8 kg    Examination: General exam: Awake, laying in bed, in nad Respiratory system: Normal respiratory effort, no wheezing Cardiovascular system: regular rate, s1, s2 Gastrointestinal system: Soft, nondistended, positive BS Central nervous system: CN2-12 grossly intact, strength intact Extremities: Perfused, no clubbing Skin: Normal skin turgor, no  notable skin lesions seen Psychiatry: Mood normal // no visual hallucinations   Data Reviewed: I have personally reviewed following labs and imaging studies  CBC: Recent Labs  Lab 02/09/21 1132 02/10/21 0446  WBC 8.0 7.8  NEUTROABS 6.0 5.5  HGB 14.6 12.7*  HCT 44.9 38.2*  MCV 90.0 89.0  PLT 228 767   Basic Metabolic Panel: Recent Labs  Lab 02/09/21 1132 02/10/21 0446  NA 135 137  K 4.7 4.8  CL 102 108  CO2 20* 22  GLUCOSE 253* 226*  BUN 65* 54*  CREATININE 2.49* 1.88*  CALCIUM 8.8*  8.8*  MG  --  2.0  PHOS  --  3.5   GFR: Estimated Creatinine Clearance: 37.5 mL/min (A) (by C-G formula based on SCr of 1.88 mg/dL (H)). Liver Function Tests: Recent Labs  Lab 02/09/21 1132 02/10/21 0446  AST 188* 97*  ALT 63* 48*  ALKPHOS 63 49  BILITOT 1.8* 1.2  PROT 7.3 5.2*  ALBUMIN 4.0 2.8*   No results for input(s): LIPASE, AMYLASE in the last 168 hours. No results for input(s): AMMONIA in the last 168 hours. Coagulation Profile: No results for input(s): INR, PROTIME in the last 168 hours. Cardiac Enzymes: Recent Labs  Lab 02/09/21 1132  CKTOTAL 19,190*   BNP (last 3 results) No results for input(s): PROBNP in the last 8760 hours. HbA1C: Recent Labs    02/09/21 1112  HGBA1C 12.6*   CBG: Recent Labs  Lab 02/09/21 1111 02/09/21 2031 02/10/21 0755 02/10/21 1149  GLUCAP 244* 299* 217* 280*   Lipid Profile: No results for input(s): CHOL, HDL, LDLCALC, TRIG, CHOLHDL, LDLDIRECT in the last 72 hours. Thyroid Function Tests: No results for input(s): TSH, T4TOTAL, FREET4, T3FREE, THYROIDAB in the last 72 hours. Anemia Panel: No results for input(s): VITAMINB12, FOLATE, FERRITIN, TIBC, IRON, RETICCTPCT in the last 72 hours. Sepsis Labs: Recent Labs  Lab 02/09/21 1132  LATICACIDVEN 1.7    Recent Results (from the past 240 hour(s))  Resp Panel by RT-PCR (Flu A&B, Covid) Nasopharyngeal Swab     Status: None   Collection Time: 02/09/21 11:32 AM   Specimen: Nasopharyngeal Swab; Nasopharyngeal(NP) swabs in vial transport medium  Result Value Ref Range Status   SARS Coronavirus 2 by RT PCR NEGATIVE NEGATIVE Final    Comment: (NOTE) SARS-CoV-2 target nucleic acids are NOT DETECTED.  The SARS-CoV-2 RNA is generally detectable in upper respiratory specimens during the acute phase of infection. The lowest concentration of SARS-CoV-2 viral copies this assay can detect is 138 copies/mL. A negative result does not preclude SARS-Cov-2 infection and should not be  used as the sole basis for treatment or other patient management decisions. A negative result may occur with  improper specimen collection/handling, submission of specimen other than nasopharyngeal swab, presence of viral mutation(s) within the areas targeted by this assay, and inadequate number of viral copies(<138 copies/mL). A negative result must be combined with clinical observations, patient history, and epidemiological information. The expected result is Negative.  Fact Sheet for Patients:  EntrepreneurPulse.com.au  Fact Sheet for Healthcare Providers:  IncredibleEmployment.be  This test is no t yet approved or cleared by the [place] FDA and  has been authorized for detection and/or diagnosis of SARS-CoV-2 by FDA under an Emergency Use Authorization (EUA). This EUA will remain  in effect (meaning this test can be used) for the duration of the COVID-19 declaration under Section 564(b)(1) of the Act, 21 U.S.C.section 360bbb-3(b)(1), unless the authorization is terminated  or revoked sooner.  Influenza A by PCR NEGATIVE NEGATIVE Final   Influenza B by PCR NEGATIVE NEGATIVE Final    Comment: (NOTE) The Xpert Xpress SARS-CoV-2/FLU/RSV plus assay is intended as an aid in the diagnosis of influenza from Nasopharyngeal swab specimens and should not be used as a sole basis for treatment. Nasal washings and aspirates are unacceptable for Xpert Xpress SARS-CoV-2/FLU/RSV testing.  Fact Sheet for Patients: EntrepreneurPulse.com.au  Fact Sheet for Healthcare Providers: IncredibleEmployment.be  This test is not yet approved or cleared by the [place] FDA and has been authorized for detection and/or diagnosis of SARS-CoV-2 by FDA under an Emergency Use Authorization (EUA). This EUA will remain in effect (meaning this test can be used) for the duration of the COVID-19 declaration under Section  564(b)(1) of the Act, 21 U.S.C. section 360bbb-3(b)(1), unless the authorization is terminated or revoked.  Performed at [facility], [place] [address], [place], [place] [address]      Radiology Studies: CT Head Wo Contrast  Result Date: 02/09/2021 CLINICAL DATA:  Head trauma, mod-severe EXAM: CT HEAD WITHOUT CONTRAST TECHNIQUE: Contiguous axial images were obtained from the base of the skull through the vertex without intravenous contrast. COMPARISON:  None. FINDINGS: Brain: There is no acute intracranial hemorrhage, mass effect, or edema. Gray-white differentiation is preserved. There is no extra-axial fluid collection. Prominence of ventricles and sulci reflects minor parenchymal volume loss. Vascular: There is atherosclerotic calcification at the skull base. Skull: Calvarium is unremarkable. Sinuses/Orbits: No acute finding. Other: None. IMPRESSION: No evidence of acute intracranial injury. Electronically Signed   By: Macy Mis M.D.   On: 02/09/2021 14:33   DG Chest Port 1 View  Result Date: 02/09/2021 CLINICAL DATA:  Shortness of breath, weakness EXAM: PORTABLE CHEST 1 VIEW COMPARISON:  None. FINDINGS: The cardiomediastinal silhouette is normal. There is asymmetric elevation of the right hemidiaphragm. Linear opacities in the left base may reflect atelectasis or scar. Otherwise, there is no focal consolidation or pulmonary edema. There is no pleural effusion or pneumothorax. There is no acute osseous abnormality. IMPRESSION: Mild left basilar atelectasis and/or scar. Otherwise, no radiographic evidence of acute cardiopulmonary process. Electronically Signed   By: Valetta Mole M.D.   On: 02/09/2021 12:37    Scheduled Meds:  amitriptyline  50 mg Oral QHS   aspirin EC  81 mg Oral Daily   enoxaparin (LOVENOX) injection  40 mg Subcutaneous Q24H   gabapentin  300 mg Oral TID   insulin aspart  0-5 Units Subcutaneous QHS   insulin aspart  0-9 Units Subcutaneous TID WC    sodium chloride flush  3 mL Intravenous Q12H   tamsulosin  0.4 mg Oral BID   Continuous Infusions:  lactated ringers 200 mL/hr at 02/10/21 9563   methocarbamol (ROBAXIN) IV       LOS: 1 day   Marylu Lund, MD [facility] Pager On Amion  If 7PM-7AM, please contact night-coverage 02/10/2021, 2:13 PM

## 2021-02-10 NOTE — Progress Notes (Addendum)
Inpatient Diabetes Program Recommendations  AACE/ADA: New Consensus Statement on Inpatient Glycemic Control (2015)  Target Ranges:  Prepandial:   less than 140 mg/dL      Peak postprandial:   less than 180 mg/dL (1-2 hours)      Critically ill patients:  140 - 180 mg/dL   Lab Results  Component Value Date   GLUCAP 217 (H) 02/10/2021   HGBA1C 12.6 (H) 02/09/2021    Review of Glycemic Control  Diabetes history: DM 2, Diabetic since 2008/2009 Outpatient Diabetes medications: Moujaro 2.5 mg QSunday (first dose on 11/13) Current orders for Inpatient glycemic control:  Novolog 0-9 units tid + hs  Inpatient Diabetes Program Recommendations:    - Start Glipizide 2.5 mg Daily  Spoke with pt at bedside. Pt sees his PCP, Dr. Theda Sers, every 3-6 months. He has been on oral meds only. Pt is legally blind and has not been on injections till now with the [facility] where the needle is already attached and it is used once. Pt's A1c 12.6% this admission. Discussed glucose goals. Pt does not check his glucose, he lives by himself, he does not trust himself to check his own glucose even if he does have a meter to tell him what his levels are.   Would like to trial Glipizide here for glucose trends.  Did go through and show/let the pt feel how to operate an insulin pen. Unsure if this is a safe option as pt does not check his glucose everyday.  When speaking about the voice meter and walking through the insulin pen pt later said he may can do it if his sister is with him the first few times. More conversation needs to happen with patient to figure out safety. Not sure if home health RN would be beneficial for some of the days out of the week.   Spoke with sister over the phone. She does not think he would be able to check his glucose or take insulin on a day to day basis.  Thanks,  Tama Headings RN, MSN, BC-ADM Inpatient Diabetes Coordinator Team Pager [phone] (8a-5p)

## 2021-02-11 ENCOUNTER — Inpatient Hospital Stay (HOSPITAL_COMMUNITY): Payer: Medicare Other

## 2021-02-11 DIAGNOSIS — N400 Enlarged prostate without lower urinary tract symptoms: Secondary | ICD-10-CM

## 2021-02-11 LAB — COMPREHENSIVE METABOLIC PANEL
ALT: 42 U/L (ref 0–44)
AST: 52 U/L — ABNORMAL HIGH (ref 15–41)
Albumin: 2.6 g/dL — ABNORMAL LOW (ref 3.5–5.0)
Alkaline Phosphatase: 47 U/L (ref 38–126)
Anion gap: 7 (ref 5–15)
BUN: 39 mg/dL — ABNORMAL HIGH (ref 8–23)
CO2: 20 mmol/L — ABNORMAL LOW (ref 22–32)
Calcium: 8.4 mg/dL — ABNORMAL LOW (ref 8.9–10.3)
Chloride: 108 mmol/L (ref 98–111)
Creatinine, Ser: 1.83 mg/dL — ABNORMAL HIGH (ref 0.61–1.24)
GFR, Estimated: 40 mL/min — ABNORMAL LOW (ref >60)
Glucose, Bld: 205 mg/dL — ABNORMAL HIGH (ref 70–99)
Potassium: 5.1 mmol/L (ref 3.5–5.1)
Sodium: 135 mmol/L (ref 135–145)
Total Bilirubin: 1.5 mg/dL — ABNORMAL HIGH (ref 0.3–1.2)
Total Protein: 5.1 g/dL — ABNORMAL LOW (ref 6.5–8.1)

## 2021-02-11 LAB — CBC WITH DIFFERENTIAL/PLATELET
Abs Immature Granulocytes: 0.07 10*3/uL (ref 0.00–0.07)
Basophils Absolute: 0 10*3/uL (ref 0.0–0.1)
Basophils Relative: 1 %
Eosinophils Absolute: 0.2 10*3/uL (ref 0.0–0.5)
Eosinophils Relative: 2 %
HCT: 37.2 % — ABNORMAL LOW (ref 39.0–52.0)
Hemoglobin: 12.3 g/dL — ABNORMAL LOW (ref 13.0–17.0)
Immature Granulocytes: 1 %
Lymphocytes Relative: 14 %
Lymphs Abs: 1.1 10*3/uL (ref 0.7–4.0)
MCH: 29.8 pg (ref 26.0–34.0)
MCHC: 33.1 g/dL (ref 30.0–36.0)
MCV: 90.1 fL (ref 80.0–100.0)
Monocytes Absolute: 0.8 10*3/uL (ref 0.1–1.0)
Monocytes Relative: 10 %
Neutro Abs: 5.4 10*3/uL (ref 1.7–7.7)
Neutrophils Relative %: 72 %
Platelets: 191 10*3/uL (ref 150–400)
RBC: 4.13 MIL/uL — ABNORMAL LOW (ref 4.22–5.81)
RDW: 14.2 % (ref 11.5–15.5)
WBC: 7.5 10*3/uL (ref 4.0–10.5)
nRBC: 0 % (ref 0.0–0.2)

## 2021-02-11 LAB — GLUCOSE, CAPILLARY
Glucose-Capillary: 209 mg/dL — ABNORMAL HIGH (ref 70–99)
Glucose-Capillary: 265 mg/dL — ABNORMAL HIGH (ref 70–99)
Glucose-Capillary: 233 mg/dL — ABNORMAL HIGH (ref 70–99)
Glucose-Capillary: 211 mg/dL — ABNORMAL HIGH (ref 70–99)

## 2021-02-11 LAB — URINALYSIS, ROUTINE W REFLEX MICROSCOPIC
Bilirubin Urine: NEGATIVE
Glucose, UA: >=500 mg/dL — AB
Ketones, ur: NEGATIVE mg/dL
Leukocytes,Ua: NEGATIVE
Nitrite: NEGATIVE
Protein, ur: NEGATIVE mg/dL
Specific Gravity, Urine: 1.008 (ref 1.005–1.030)
pH: 5 (ref 5.0–8.0)

## 2021-02-11 LAB — TSH: TSH: 1.304 u[IU]/mL (ref 0.350–4.500)

## 2021-02-11 LAB — MYOGLOBIN, SERUM: Myoglobin: 1217 ng/mL — ABNORMAL HIGH (ref 28–72)

## 2021-02-11 LAB — CK: Total CK: 2006 U/L — ABNORMAL HIGH (ref 49–397)

## 2021-02-11 MED ORDER — METOPROLOL TARTRATE 12.5 MG HALF TABLET
12.5000 mg | ORAL_TABLET | Freq: Two times a day (BID) | ORAL | Status: DC
  Administered 2021-02-11 – 2021-02-12 (×3): 12.5 mg via ORAL
  Filled 2021-02-11 (×3): qty 1

## 2021-02-11 NOTE — NC FL2 (Signed)
[place] LEVEL OF CARE SCREENING TOOL     IDENTIFICATION  Patient Name: Dustin Duffy Birthdate: 29-Sep-1955 Sex: male Admission Date (Current Location): 02/09/2021  [facility] [place] and [place] Number:  Herbalist and Address:  [facility],  [place], [place]      Provider Number: [id number]  Attending Physician Name and Address:  Donne Hazel, MD  Relative Name and Phone Number:  Neftaly, [place] (Sister)   [phone]    Current Level of Care: Hospital Recommended Level of Care: [facility] Prior Approval Number:    Date Approved/Denied:   PASRR Number: [id number]  Discharge Plan: SNF    Current Diagnoses: Patient Active Problem List   Diagnosis Date Noted   BPH without urinary obstruction 02/09/2021   Diabetes mellitus type 2 in nonobese ([place]) 02/09/2021   Dyslipidemia 02/09/2021   Essential hypertension 02/09/2021   Renal impairment 02/09/2021   Non-traumatic rhabdomyolysis 02/09/2021   Visual impairment 02/09/2021    Orientation RESPIRATION BLADDER Height & Weight     Self, Situation, Place  Normal External catheter Weight: 70.8 kg Height:  5' 8.5" (174 cm)  BEHAVIORAL SYMPTOMS/MOOD NEUROLOGICAL BOWEL NUTRITION STATUS      Continent Diet (see d/c summary)  AMBULATORY STATUS COMMUNICATION OF NEEDS Skin   Extensive Assist Verbally Normal                       Personal Care Assistance Level of Assistance  Bathing, Feeding, Dressing Bathing Assistance: Maximum assistance Feeding assistance: Independent Dressing Assistance: Limited assistance     Functional Limitations Info  Sight, Hearing, Speech Sight Info: Impaired (legally blind) Hearing Info: Adequate Speech Info: Adequate    SPECIAL CARE FACTORS FREQUENCY  PT (By licensed PT), OT (By licensed OT)     PT Frequency: 5X/W OT Frequency: 5X/W            Contractures Contractures Info: Not present    Additional Factors Info   Code Status, Allergies Code Status Info: DNR Allergies Info: NKA           Current Medications (02/11/2021):  This is the current hospital active medication list Current Facility-Administered Medications  Medication Dose Route Frequency Provider Last Rate Last Admin   acetaminophen (TYLENOL) tablet 650 mg  650 mg Oral Q6H PRN Karmen Bongo, MD       Or   acetaminophen (TYLENOL) suppository 650 mg  650 mg Rectal Q6H PRN Karmen Bongo, MD       amitriptyline (ELAVIL) tablet 50 mg  50 mg Oral Ivery Quale, MD   50 mg at 02/10/21 2041   aspirin EC tablet 81 mg  81 mg Oral Daily Karmen Bongo, MD   81 mg at 02/11/21 0856   enoxaparin (LOVENOX) injection 40 mg  40 mg Subcutaneous Q24H Donne Hazel, MD   40 mg at 02/10/21 2042   gabapentin (NEURONTIN) capsule 300 mg  300 mg Oral TID Karmen Bongo, MD   300 mg at 02/11/21 0856   hydrALAZINE (APRESOLINE) injection 5 mg  5 mg Intravenous Q4H PRN Karmen Bongo, MD       insulin aspart (novoLOG) injection 0-5 Units  0-5 Units Subcutaneous QHS Karmen Bongo, MD   3 Units at 02/10/21 2041   insulin aspart (novoLOG) injection 0-9 Units  0-9 Units Subcutaneous TID WC Karmen Bongo, MD   5 Units at 02/11/21 1340   methocarbamol (ROBAXIN) 500 mg in dextrose 5 % 50 mL IVPB  500 mg Intravenous Q6H PRN Karmen Bongo, MD       metoprolol tartrate (LOPRESSOR) tablet 12.5 mg  12.5 mg Oral BID Donne Hazel, MD   12.5 mg at 02/11/21 1057   ondansetron (ZOFRAN) tablet 4 mg  4 mg Oral Q6H PRN Karmen Bongo, MD       Or   ondansetron [facility]) injection 4 mg  4 mg Intravenous Q6H PRN Karmen Bongo, MD       oxyCODONE (Oxy IR/ROXICODONE) immediate release tablet 5 mg  5 mg Oral Q4H PRN Karmen Bongo, MD       sodium chloride flush (NS) 0.9 % injection 3 mL  3 mL Intravenous Q12H Karmen Bongo, MD   3 mL at 02/11/21 0857   tamsulosin (FLOMAX) capsule 0.4 mg  0.4 mg Oral BID Karmen Bongo, MD   0.4 mg at 02/11/21 0034      Discharge Medications: Please see discharge summary for a list of discharge medications.  Relevant Imaging Results:  Relevant Lab Results:   Additional Information [place], [place]

## 2021-02-11 NOTE — Progress Notes (Signed)
Patient noted to be satting at 79% RA, no oxygen requirement prior. Audible wheezing noted. Patient repositioned, 5L oxygen administered oxygen satting at 90 currently. On call MD notified. Agricultural consultant notified.

## 2021-02-11 NOTE — Progress Notes (Signed)
Inpatient Diabetes Program Recommendations  AACE/ADA: New Consensus Statement on Inpatient Glycemic Control (2015)  Target Ranges:  Prepandial:   less than 140 mg/dL      Peak postprandial:   less than 180 mg/dL (1-2 hours)      Critically ill patients:  140 - 180 mg/dL   Lab Results  Component Value Date   GLUCAP 265 (H) 02/11/2021   HGBA1C 12.6 (H) 02/09/2021    Review of Glycemic Control  Diabetes history: DM 2, Diabetic since 2008/2009 Outpatient Diabetes medications: Moujaro 2.5 mg QSunday (first dose on 11/13) Current orders for Inpatient glycemic control:  Novolog 0-9 units tid + hs   Inpatient Diabetes Program Recommendations:     - Start Glipizide 2.5 mg Daily  See note stating pt. Not checking CBGs and does not feel comfortable giving injections.  Thank you, Nani Gasser. Audrey Eller, RN, MSN, CDE  Diabetes Coordinator Inpatient Glycemic Control Team Team Pager [phone] (8am-5pm) 02/11/2021 1:47 PM

## 2021-02-11 NOTE — Evaluation (Signed)
Physical Therapy Evaluation Patient Details Name: Dustin Duffy MRN: [id number] DOB: [birth year] Today's Date: 02/11/2021  History of Present Illness  Patient is 65 y.o. male presented to [facility] on 02/09/21 via EMS with worsening LE weakness and multiple falls in the last few days. PMH significant for DM, legally blind bil, HTN, BPH.   Clinical Impression  Dustin Duffy is 65 y.o. male admitted with above HPI and diagnosis. Patient is currently limited by functional impairments below (see PT problem list). Patient lives alone and is mod independent with RW/SPC for mobility at baseline. Currently pt required 2+ assist for safety. He ambulated 2 bouts of gait with RW, bil LE noted to buckle slightly when he fatigued and pt educated on importance of seated rest to reduce risk of falling. HR noted to reach max of 134 bpm during gait and dropped to 60's as well. RN notified pt with a abnormal HR/tachycardia. Patient will benefit from continued skilled PT interventions to address impairments and progress independence with mobility, recommending SNF. Acute PT will follow and progress as able.        Recommendations for follow up therapy are one component of a multi-disciplinary discharge planning process, led by the attending physician.  Recommendations may be updated based on patient status, additional functional criteria and insurance authorization.  Follow Up Recommendations Skilled nursing-short term rehab (<3 hours/day)    Assistance Recommended at Discharge Frequent or constant Supervision/Assistance  Functional Status Assessment Patient has had a recent decline in their functional status and demonstrates the ability to make significant improvements in function in a reasonable and predictable amount of time.  Equipment Recommendations  None recommended by PT    Recommendations for Other Services       Precautions / Restrictions Precautions Precautions: Fall Restrictions Weight Bearing  Restrictions: No      Mobility  Bed Mobility Overal bed mobility: Needs Assistance Bed Mobility: Supine to Sit     Supine to sit: Min assist;HOB elevated     General bed mobility comments: cues to roll and reach for bed rail, assist to brign trunk upright and pivot hips with bed pad to get feet on floor.    Transfers Overall transfer level: Needs assistance Equipment used: Rolling walker (2 wheels) Transfers: Sit to/from Stand;Bed to chair/wheelchair/BSC Sit to Stand: Min assist;From elevated surface   Step pivot transfers: Min assist       General transfer comment: 2HHA initially with min assist to rise from EOB. pt able to take small side steps at EOB and then forward to turn to recliner. Cues for safe technique with RW for sit<>stand and min assist to steady with rise.    Ambulation/Gait Ambulation/Gait assistance: Min assist Gait Distance (Feet): 100 Feet Assistive device: Rolling walker (2 wheels) Gait Pattern/deviations: Step-through pattern;Decreased stride length;Shuffle;Knees buckling;Trunk flexed Gait velocity: decr     General Gait Details: cues for posture and safe proximity to RW, pt amb 2 bouts of gait (60', 40') and seated rest provided as pt's knee began to buckle due to fatigue.  Stairs            Wheelchair Mobility    Modified Rankin (Stroke Patients Only)       Balance Overall balance assessment: History of Falls;Needs assistance Sitting-balance support: Feet supported Sitting balance-Leahy Scale: Good     Standing balance support: Reliant on assistive device for balance;Bilateral upper extremity supported;During functional activity Standing balance-Leahy Scale: Poor  Pertinent Vitals/Pain Pain Assessment: No/denies pain    Home Living Family/patient expects to be discharged to:: Private residence Living Arrangements: Alone Available Help at Discharge: Family;Friend(s);Available  PRN/intermittently Type of Home: House Home Access: Stairs to enter Entrance Stairs-Rails: Can reach both Entrance Stairs-Number of Steps: 3+1   Home Layout: One level Home Equipment: Conservation officer, nature (2 wheels);Rollator (4 wheels);Cane - single point;Grab bars - tub/shower      Prior Function Prior Level of Function : Independent/Modified Independent             Mobility Comments: pt reports use of RW or SPC intermittently for balance. pt lives alone and is typically independent with all ADL's.       Hand Dominance   Dominant Hand: Right    Extremity/Trunk Assessment   Upper Extremity Assessment Upper Extremity Assessment: Overall WFL for tasks assessed    Lower Extremity Assessment Lower Extremity Assessment: Generalized weakness (5x Sit<>Stand: With UE use = 15.12 seconds; Without UE use = 19.28 seconds)    Cervical / Trunk Assessment Cervical / Trunk Assessment: Normal  Communication   Communication: No difficulties  Cognition Arousal/Alertness: Awake/alert Behavior During Therapy: WFL for tasks assessed/performed Overall Cognitive Status: Within Functional Limits for tasks assessed                                          General Comments      Exercises     Assessment/Plan    PT Assessment Patient needs continued PT services  PT Problem List Decreased strength;Decreased range of motion;Decreased activity tolerance;Decreased balance;Decreased mobility;Decreased knowledge of use of DME;Decreased safety awareness       PT Treatment Interventions DME instruction;Gait training;Stair training;Functional mobility training;Therapeutic activities;Therapeutic exercise;Balance training;Patient/family education    PT Goals (Current goals can be found in the Care Plan section)  Acute Rehab PT Goals Patient Stated Goal: regain strength and stop falling PT Goal Formulation: With patient Time For Goal Achievement: 02/25/21 Potential to Achieve  Goals: Good    Frequency 7X/week   Barriers to discharge        Co-evaluation               AM-PAC PT "6 Clicks" Mobility  Outcome Measure Help needed turning from your back to your side while in a flat bed without using bedrails?: A Little Help needed moving from lying on your back to sitting on the side of a flat bed without using bedrails?: A Little Help needed moving to and from a bed to a chair (including a wheelchair)?: A Lot Help needed standing up from a chair using your arms (e.g., wheelchair or bedside chair)?: A Lot Help needed to walk in hospital room?: A Lot Help needed climbing 3-5 steps with a railing? : Total 6 Click Score: 13    End of Session Equipment Utilized During Treatment: Gait belt Activity Tolerance: Patient tolerated treatment well Patient left: in chair;with call bell/phone within reach;with chair alarm set;with family/visitor present Nurse Communication: Mobility status PT Visit Diagnosis: Muscle weakness (generalized) (M62.81);Difficulty in walking, not elsewhere classified (R26.2);Unsteadiness on feet (R26.81);History of falling (Z91.81)    Time: 6734-1937 PT Time Calculation (min) (ACUTE ONLY): 42 min   Charges:   PT Evaluation $PT Eval Moderate Complexity: 1 Mod PT Treatments $Gait Training: 8-22 mins $Therapeutic Activity: 8-22 mins        Gwynneth Albright PT, DPT Acute Rehabilitation Services Office  [phone] Pager [phone]   Jacques Navy 02/11/2021, 1:08 PM

## 2021-02-11 NOTE — Progress Notes (Signed)
    OVERNIGHT PROGRESS REPORT  Reduced fluid rate due to continued increase of peripheral edema. Urine output remains good. Lungs sounds remain clear.    Gershon Cull MSNA MSN ACNPC-AG Acute Care Nurse Practitioner [place]

## 2021-02-11 NOTE — Progress Notes (Signed)
Bladder scan performed on patient noted to be 715. Intermittent straight cath inserted, 700cc of output noted. MD notified.

## 2021-02-11 NOTE — TOC Initial Note (Signed)
Transition of Care [facility]) - Initial/Assessment Note    Patient Details  Name: Dustin Duffy MRN: [id number] Date of Birth: [birth year]  [facility] CM/SW Contact:    Trish Mage, LCSW Phone Number: 02/11/2021, 2:48 PM  Clinical Narrative:   Patient seen in follow up to PT recommendation of short term rehab.  Dustin Duffy lives alone here in [place]. His sister, who lives 6 blocks away, does his shopping for him, as well as transporting him to appointments.  He is open to going to SNF as he admits he feels weak, wants help so he can safely return home. Bed search process explained and implemented. TOC will continue to follow during the course of hospitalization.                Expected Discharge Plan: Skilled Nursing Facility Barriers to Discharge: SNF Pending bed offer   Patient Goals and CMS Choice     Choice offered to / list presented to : Patient  Expected Discharge Plan and Services Expected Discharge Plan: [place]   Discharge Planning Services: CM Consult Post Acute Care Choice: [place] Living arrangements for the past 2 months: Single Family Home                                      Prior Living Arrangements/Services Living arrangements for the past 2 months: Single Family Home Lives with:: Self Patient language and need for interpreter reviewed:: Yes        Need for Family Participation in Patient Care: Yes (Comment) Care giver support system in place?: Yes (comment)   Criminal Activity/Legal Involvement Pertinent to Current Situation/Hospitalization: No - Comment as needed  Activities of Daily Living Home Assistive Devices/Equipment: Cane (specify quad or straight), Walker (specify type) ADL Screening (condition at time of admission) Patient's cognitive ability adequate to safely complete daily activities?: Yes Is the patient deaf or have difficulty hearing?: No Does the patient have difficulty seeing, even when  wearing glasses/contacts?: Yes Does the patient have difficulty concentrating, remembering, or making decisions?: No Patient able to express need for assistance with ADLs?: Yes Does the patient have difficulty dressing or bathing?: No Independently performs ADLs?: Yes (appropriate for developmental age) Does the patient have difficulty walking or climbing stairs?: Yes Weakness of Legs: Both Weakness of Arms/Hands: Both  Permission Sought/Granted                  Emotional Assessment Appearance:: Appears stated age Attitude/Demeanor/Rapport: Engaged Affect (typically observed): Appropriate Orientation: : Oriented to Self, Oriented to Place, Oriented to Situation Alcohol / Substance Use: Not Applicable Psych Involvement: No (comment)  Admission diagnosis:  Weakness [R53.1] Non-traumatic rhabdomyolysis [M62.82] Patient Active Problem List   Diagnosis Date Noted   BPH without urinary obstruction 02/09/2021   Diabetes mellitus type 2 in nonobese ([place]) 02/09/2021   Dyslipidemia 02/09/2021   Essential hypertension 02/09/2021   Renal impairment 02/09/2021   Non-traumatic rhabdomyolysis 02/09/2021   Visual impairment 02/09/2021   PCP:  Janie Morning, DO Pharmacy:   [id number] - [place], [place] [address] Phone: [phone] Fax: [phone]     Social Determinants of Health (SDOH) Interventions    Readmission Risk Interventions No flowsheet data found.

## 2021-02-11 NOTE — Progress Notes (Signed)
At the beginning of the shift, the pt was complaining of SOB. His oxygenation was declining, and oxygen requirements were increasing--RN initiated oxygen therapy via [place], 5 L, to achieve oxygen saturation of 95%. Pt was also tachycardic, and the hospitalist on call was notified. Chest X-R was obtained after a few minutes. Pt's oxygen requirements started improving

## 2021-02-11 NOTE — Progress Notes (Signed)
PROGRESS NOTE    Dustin Duffy  LEX:[id number] DOB: 03-03-1956 DOA: 02/09/2021 PCP: Janie Morning, DO    Brief Narrative:  65 y.o. male with medical history significant of DM; BPH; HLD; HTN; and CKD presenting with weakness, falls.  He reports that he has been having problems with his R leg, had a remote injury.  It has been hurting a lot in the last few years.  He developed DM a few years ago and he has lost more of his vision.  Both of his legs have been hurting the last few days and he hasn't been able to walk.  He fell several times yesterday.  He fell again last night and it took him a couple of hours to get to the living room and then laid half on/off the sofa for the next few hours.  He managed to stand this AM and walked to the bathroom and bedroom and his legs gave out again.  He started a new DM medication Sunday and he didn't eat at all Monday.   He denies drinking ETOH   Assessment & Plan:   Principal Problem:   Non-traumatic rhabdomyolysis Active Problems:   BPH without urinary obstruction   Diabetes mellitus type 2 in nonobese (HCC)   Dyslipidemia   Essential hypertension   Renal impairment   Visual impairment   * Non-traumatic [place]habdomyolysis -[place] [address] with Cr 2.49, unknown baseline Cr -Likely related to recurrent falls with baseline limited physical activity, exacerbated by ACE -Renal function improved with IVF -CK is trending down -Clinically edematous thus will hold IVF for now   Renal impairment -Unclear baseline -Improved with IVF  -Resume ACE if renal function will support, avoid nephrotoxic agents for now -Will check renal US and UA -Repeat bmet in AM   Visual impairment -Legally blind   Essential hypertension -Hold lisinopril -started low dose    Dyslipidemia -Hold Zocor given elevated LFTs -LFT's trending down   Diabetes mellitus type 2 in nonobese (HCC) -A1c of 12.6 suggesting very poor control -Continued on sensitive-scale  SSI -Discussed with Diabetic Coordinator. Pt is legally blind -On oral hyperglycemic meds prior to admit, likely suboptimal regimen -Recommendation to f/u closely with Endocrine   BPH -Continued on flomax -Renal US ordered per above  Sinus tachycardia -HR into the 120's noted -Ordered and reviewed EKG, conssitent with sinus tach -ordered trial of low dose metoprolol   DVT prophylaxis: Lovenox subq Code Status: DNR Family Communication: Pt in room, family not at bedside  Status is: Inpatient  Remains inpatient appropriate because: Severity of illness requiring IVF   Consultants:    Procedures:    Antimicrobials: Anti-infectives (From admission, onward)    None       Subjective: Without complaints at this time. Denies chest pain or sob  Objective: Vitals:   02/10/21 1946 02/10/21 2346 02/11/21 0324 02/11/21 1223  BP: 137/82 (!) 143/68 125/70 102/68  Pulse: (!) 115 100 100 (!) 56  Resp: 20 20 20 20   Temp: 99.7 F (37.6 C) 98.9 F (37.2 C) 98 F (36.7 C) 99.6 F (37.6 C)  TempSrc: Oral Axillary Axillary Oral  SpO2: 92% 93% 91% (!) 88%  Weight:      Height:        Intake/Output Summary (Last 24 hours) at 02/11/2021 1427 Last data filed at 02/11/2021 0324 Gross per 24 hour  Intake 4000 ml  Output 1120 ml  Net 2880 ml    Filed Weights   02/09/21 1112  Weight: 70.8  kg    Examination: General exam: Conversant, in no acute distress Respiratory system: normal chest rise, clear, no audible wheezing Cardiovascular system: regular rhythm, s1-s2 Gastrointestinal system: Nondistended, nontender, pos BS Central nervous system: No seizures, no tremors Extremities: No cyanosis, no joint deformities Skin: No rashes, no pallor Psychiatry: Affect normal // no auditory hallucinations   Data Reviewed: I have personally reviewed following labs and imaging studies  CBC: Recent Labs  Lab 02/09/21 1132 02/10/21 0446 02/11/21 0709  WBC 8.0 7.8 7.5  NEUTROABS  6.0 5.5 5.4  HGB 14.6 12.7* 12.3*  HCT 44.9 38.2* 37.2*  MCV 90.0 89.0 90.1  PLT 228 211 976    Basic Metabolic Panel: Recent Labs  Lab 02/09/21 1132 02/10/21 0446 02/10/21 2010 02/11/21 0501  NA 135 137 136 135  K 4.7 4.8 5.7* 5.1  CL 102 108 105 108  CO2 20* 22 23 20*  GLUCOSE 253* 226* 267* 205*  BUN 65* 54* 44* 39*  CREATININE 2.49* 1.88* 1.98* 1.83*  CALCIUM 8.8* 8.8* 8.8* 8.4*  MG  --  2.0 2.0  --   PHOS  --  3.5 3.2  --     GFR: Estimated Creatinine Clearance: 38.6 mL/min (A) (by C-G formula based on SCr of 1.83 mg/dL (H)). Liver Function Tests: Recent Labs  Lab 02/09/21 1132 02/10/21 0446 02/10/21 2010 02/11/21 0501  AST 188* 97* 76* 52*  ALT 63* 48* 51* 42  ALKPHOS 63 49 53 47  BILITOT 1.8* 1.2 1.4* 1.5*  PROT 7.3 5.2* 6.0* 5.1*  ALBUMIN 4.0 2.8* 2.9* 2.6*    No results for input(s): LIPASE, AMYLASE in the last 168 hours. No results for input(s): AMMONIA in the last 168 hours. Coagulation Profile: No results for input(s): INR, PROTIME in the last 168 hours. Cardiac Enzymes: Recent Labs  Lab 02/09/21 1132 02/11/21 0501  CKTOTAL 19,190* 2,006*    BNP (last 3 results) No results for input(s): PROBNP in the last 8760 hours. HbA1C: Recent Labs    02/09/21 1112  HGBA1C 12.6*    CBG: Recent Labs  Lab 02/10/21 1149 02/10/21 1647 02/10/21 1948 02/11/21 0819 02/11/21 1214  GLUCAP 280* 228* 252* 209* 265*    Lipid Profile: No results for input(s): CHOL, HDL, LDLCALC, TRIG, CHOLHDL, LDLDIRECT in the last 72 hours. Thyroid Function Tests: No results for input(s): TSH, T4TOTAL, FREET4, T3FREE, THYROIDAB in the last 72 hours. Anemia Panel: No results for input(s): VITAMINB12, FOLATE, FERRITIN, TIBC, IRON, RETICCTPCT in the last 72 hours. Sepsis Labs: Recent Labs  Lab 02/09/21 1132  LATICACIDVEN 1.7     Recent Results (from the past 240 hour(s))  Resp Panel by RT-PCR (Flu A&B, Covid) Nasopharyngeal Swab     Status: None   Collection  Time: 02/09/21 11:32 AM   Specimen: Nasopharyngeal Swab; Nasopharyngeal(NP) swabs in vial transport medium  Result Value Ref Range Status   SARS Coronavirus 2 by RT PCR NEGATIVE NEGATIVE Final    Comment: (NOTE) SARS-CoV-2 target nucleic acids are NOT DETECTED.  The SARS-CoV-2 RNA is generally detectable in upper respiratory specimens during the acute phase of infection. The lowest concentration of SARS-CoV-2 viral copies this assay can detect is 138 copies/mL. A negative result does not preclude SARS-Cov-2 infection and should not be used as the sole basis for treatment or other patient management decisions. A negative result may occur with  improper specimen collection/handling, submission of specimen other than nasopharyngeal swab, presence of viral mutation(s) within the areas targeted by this assay, and inadequate number  of viral copies(<138 copies/mL). A negative result must be combined with clinical observations, patient history, and epidemiological information. The expected result is Negative.  Fact Sheet for Patients:  EntrepreneurPulse.com.au  Fact Sheet for Healthcare Providers:  IncredibleEmployment.be  This test is no t yet approved or cleared by the [place] FDA and  has been authorized for detection and/or diagnosis of SARS-CoV-2 by FDA under an Emergency Use Authorization (EUA). This EUA will remain  in effect (meaning this test can be used) for the duration of the COVID-19 declaration under Section 564(b)(1) of the Act, 21 U.S.C.section 360bbb-3(b)(1), unless the authorization is terminated  or revoked sooner.       Influenza A by PCR NEGATIVE NEGATIVE Final   Influenza B by PCR NEGATIVE NEGATIVE Final    Comment: (NOTE) The Xpert Xpress SARS-CoV-2/FLU/RSV plus assay is intended as an aid in the diagnosis of influenza from Nasopharyngeal swab specimens and should not be used as a sole basis for treatment. Nasal washings  and aspirates are unacceptable for Xpert Xpress SARS-CoV-2/FLU/RSV testing.  Fact Sheet for Patients: EntrepreneurPulse.com.au  Fact Sheet for Healthcare Providers: IncredibleEmployment.be  This test is not yet approved or cleared by the [place] FDA and has been authorized for detection and/or diagnosis of SARS-CoV-2 by FDA under an Emergency Use Authorization (EUA). This EUA will remain in effect (meaning this test can be used) for the duration of the COVID-19 declaration under Section 564(b)(1) of the Act, 21 U.S.C. section 360bbb-3(b)(1), unless the authorization is terminated or revoked.  Performed at [facility], [place] [address], [place], [place] [address]       Radiology Studies: No results found.  Scheduled Meds:  amitriptyline  50 mg Oral QHS   aspirin EC  81 mg Oral Daily   enoxaparin (LOVENOX) injection  40 mg Subcutaneous Q24H   gabapentin  300 mg Oral TID   insulin aspart  0-5 Units Subcutaneous QHS   insulin aspart  0-9 Units Subcutaneous TID WC   metoprolol tartrate  12.5 mg Oral BID   sodium chloride flush  3 mL Intravenous Q12H   tamsulosin  0.4 mg Oral BID   Continuous Infusions:  methocarbamol (ROBAXIN) IV       LOS: 2 days   Marylu Lund, MD [facility] Pager On Amion  If 7PM-7AM, please contact night-coverage 02/11/2021, 2:27 PM

## 2021-02-12 ENCOUNTER — Inpatient Hospital Stay (HOSPITAL_COMMUNITY): Payer: Medicare Other

## 2021-02-12 DIAGNOSIS — E785 Hyperlipidemia, unspecified: Secondary | ICD-10-CM

## 2021-02-12 DIAGNOSIS — I5030 Unspecified diastolic (congestive) heart failure: Secondary | ICD-10-CM | POA: Diagnosis not present

## 2021-02-12 LAB — ECHOCARDIOGRAM COMPLETE
AR max vel: 3.14 cm2
AV Area VTI: 3.16 cm2
AV Area mean vel: 2.99 cm2
AV Mean grad: 5 mmHg
AV Peak grad: 7.6 mmHg
Ao pk vel: 1.38 m/s
Area-P 1/2: 5.31 cm2
Height: 68.5 in
S' Lateral: 2.2 cm
Weight: 2496 oz

## 2021-02-12 LAB — CBC WITH DIFFERENTIAL/PLATELET
Abs Immature Granulocytes: 0.07 10*3/uL (ref 0.00–0.07)
Basophils Absolute: 0.1 10*3/uL (ref 0.0–0.1)
Basophils Relative: 1 %
Eosinophils Absolute: 0.2 10*3/uL (ref 0.0–0.5)
Eosinophils Relative: 2 %
HCT: 38 % — ABNORMAL LOW (ref 39.0–52.0)
Hemoglobin: 12.1 g/dL — ABNORMAL LOW (ref 13.0–17.0)
Immature Granulocytes: 1 %
Lymphocytes Relative: 11 %
Lymphs Abs: 1 10*3/uL (ref 0.7–4.0)
MCH: 29.6 pg (ref 26.0–34.0)
MCHC: 31.8 g/dL (ref 30.0–36.0)
MCV: 92.9 fL (ref 80.0–100.0)
Monocytes Absolute: 1.1 10*3/uL — ABNORMAL HIGH (ref 0.1–1.0)
Monocytes Relative: 12 %
Neutro Abs: 6.3 10*3/uL (ref 1.7–7.7)
Neutrophils Relative %: 73 %
Platelets: 211 10*3/uL (ref 150–400)
RBC: 4.09 MIL/uL — ABNORMAL LOW (ref 4.22–5.81)
RDW: 14.3 % (ref 11.5–15.5)
WBC: 8.6 10*3/uL (ref 4.0–10.5)
nRBC: 0 % (ref 0.0–0.2)

## 2021-02-12 LAB — COMPREHENSIVE METABOLIC PANEL
ALT: 36 U/L (ref 0–44)
AST: 27 U/L (ref 15–41)
Albumin: 2.6 g/dL — ABNORMAL LOW (ref 3.5–5.0)
Alkaline Phosphatase: 47 U/L (ref 38–126)
Anion gap: 10 (ref 5–15)
BUN: 39 mg/dL — ABNORMAL HIGH (ref 8–23)
CO2: 23 mmol/L (ref 22–32)
Calcium: 8.6 mg/dL — ABNORMAL LOW (ref 8.9–10.3)
Chloride: 104 mmol/L (ref 98–111)
Creatinine, Ser: 1.69 mg/dL — ABNORMAL HIGH (ref 0.61–1.24)
GFR, Estimated: 44 mL/min — ABNORMAL LOW (ref >60)
Glucose, Bld: 263 mg/dL — ABNORMAL HIGH (ref 70–99)
Potassium: 5.7 mmol/L — ABNORMAL HIGH (ref 3.5–5.1)
Sodium: 137 mmol/L (ref 135–145)
Total Bilirubin: 1.1 mg/dL (ref 0.3–1.2)
Total Protein: 5.6 g/dL — ABNORMAL LOW (ref 6.5–8.1)

## 2021-02-12 LAB — GLUCOSE, CAPILLARY
Glucose-Capillary: 227 mg/dL — ABNORMAL HIGH (ref 70–99)
Glucose-Capillary: 234 mg/dL — ABNORMAL HIGH (ref 70–99)
Glucose-Capillary: 232 mg/dL — ABNORMAL HIGH (ref 70–99)
Glucose-Capillary: 270 mg/dL — ABNORMAL HIGH (ref 70–99)

## 2021-02-12 LAB — CK: Total CK: 572 U/L — ABNORMAL HIGH (ref 49–397)

## 2021-02-12 MED ORDER — METOPROLOL TARTRATE 25 MG PO TABS
25.0000 mg | ORAL_TABLET | Freq: Two times a day (BID) | ORAL | Status: DC
  Administered 2021-02-12 – 2021-02-15 (×6): 25 mg via ORAL
  Filled 2021-02-12 (×6): qty 1

## 2021-02-12 MED ORDER — LABETALOL HCL 5 MG/ML IV SOLN: 5.0000 mg | INTRAVENOUS | Status: DC | PRN

## 2021-02-12 MED ORDER — CHLORHEXIDINE GLUCONATE CLOTH 2 % EX PADS
6.0000 | MEDICATED_PAD | Freq: Every day | CUTANEOUS | Status: DC
  Administered 2021-02-12 – 2021-02-15 (×4): 6 via TOPICAL

## 2021-02-12 MED ORDER — METOPROLOL TARTRATE 12.5 MG HALF TABLET
12.5000 mg | ORAL_TABLET | Freq: Once | ORAL | Status: AC
  Administered 2021-02-12: 12.5 mg via ORAL
  Filled 2021-02-12: qty 1

## 2021-02-12 MED ORDER — SODIUM ZIRCONIUM CYCLOSILICATE 5 G PO PACK
5.0000 g | PACK | Freq: Once | ORAL | Status: AC
  Administered 2021-02-12: 5 g via ORAL
  Filled 2021-02-12: qty 1

## 2021-02-12 NOTE — Care Management Important Message (Signed)
Important Message  Patient Details IM Letter placed in Patients room. Name: Dustin Duffy MRN: [id number] Date of Birth: 10-Feb-1956   Medicare Important Message Given:  Yes     Kerin Salen 02/12/2021, 12:20 PM

## 2021-02-12 NOTE — Progress Notes (Signed)
   02/12/21 0801  Assess: MEWS Score  Temp 99.3 F (37.4 C)  BP (!) 139/95  Pulse Rate (!) 125  Resp (!) 22  SpO2 93 %  Assess: MEWS Score  MEWS Temp 0  MEWS Systolic 0  MEWS Pulse 2  MEWS RR 1  MEWS LOC 0  MEWS Score 3  MEWS Score Color Yellow  Assess: if the MEWS score is Yellow or Red  Were vital signs taken at a resting state? Yes  Focused Assessment No change from prior assessment  Does the patient meet 2 or more of the SIRS criteria? Yes  Does the patient have a confirmed or suspected source of infection? Yes  Provider and Rapid Response Notified? Yes  MEWS guidelines implemented *See Row Information* Yes  Treat  MEWS Interventions Administered scheduled meds/treatments  Pain Scale 0-10  Pain Score 0  Take Vital Signs  Increase Vital Sign Frequency  Yellow: Q 2hr X 2 then Q 4hr X 2, if remains yellow, continue Q 4hrs  Escalate  MEWS: Escalate Yellow: discuss with charge nurse/RN and consider discussing with provider and RRT  Notify: Charge Nurse/RN  Name of Charge Nurse/RN Notified Nellie Chevalier, RN  Date Charge Nurse/RN Notified 02/12/21  Time Charge Nurse/RN Notified 0805  Notify: Provider  Provider Name/Title Wyline Copas, MD  Date Provider Notified 02/12/21  Time Provider Notified [phone]  Notification Type Face-to-face  Notification Reason Change in status  Provider response See new orders  Date of Provider Response 02/12/21  Time of Provider Response 0830  Document  Patient Outcome Stabilized after interventions  Progress note created (see row info) Yes  Assess: SIRS CRITERIA  SIRS Temperature  0  SIRS Pulse 1  SIRS Respirations  1  SIRS WBC 0  SIRS Score Sum  2   Pt in Yellow MEWS d/t increased HR and increased RR. MD aware. New orders placed for prn medication and administered scheduled metoprolol. Yellow MEWS guidelines implemented.

## 2021-02-12 NOTE — Progress Notes (Signed)
The patient is injury-free, afebrile, alert, and oriented X 3. Pt has sinus tachycardia (Yellow MEWS). The rest of the vital signs are within the baseline. The patient is experiencing urinary retention. Two in-out catheterizations were performed within 24 hours. Pt denies any urge to urinate. The hospitalist on call was notified and ordered the insertion of straight urinary catheterization. Pt denies chest pain, SOB, nausea, vomiting, dizziness, signs or symptoms of bleeding or infection or acute changes during this shift. We will continue to monitor and work toward achieving the care plan goals.

## 2021-02-12 NOTE — Plan of Care (Signed)
  Problem: Elimination: Goal: Will not experience complications related to bowel motility Outcome: Progressing Goal: Will not experience complications related to urinary retention Outcome: Not Progressing   Problem: Education: Goal: Knowledge of General Education information will improve Description: Including pain rating scale, medication(s)/side effects and non-pharmacologic comfort measures Outcome: Progressing   Problem: Health Behavior/Discharge Planning: Goal: Ability to manage health-related needs will improve Outcome: Progressing   Problem: Clinical Measurements: Goal: Ability to maintain clinical measurements within normal limits will improve Outcome: Progressing Goal: Will remain free from infection Outcome: Progressing Goal: Diagnostic test results will improve Outcome: Progressing Goal: Respiratory complications will improve Outcome: Progressing Goal: Cardiovascular complication will be avoided Outcome: Progressing   Problem: Nutrition: Goal: Adequate nutrition will be maintained Outcome: Progressing   Problem: Coping: Goal: Level of anxiety will decrease Outcome: Progressing   Problem: Pain Managment: Goal: General experience of comfort will improve Outcome: Progressing   Problem: Safety: Goal: Ability to remain free from injury will improve Outcome: Progressing   Problem: Skin Integrity: Goal: Risk for impaired skin integrity will decrease Outcome: Progressing

## 2021-02-12 NOTE — Progress Notes (Signed)
PROGRESS NOTE    Dustin Duffy  BSW:[id number] DOB: [birth year] DOA: 02/09/2021 PCP: Janie Morning, DO    Brief Narrative:  65 y.o. male with medical history significant of DM; BPH; HLD; HTN; and CKD presenting with weakness, falls.  He reports that he has been having problems with his R leg, had a remote injury.  It has been hurting a lot in the last few years.  He developed DM a few years ago and he has lost more of his vision.  Both of his legs have been hurting the last few days and he hasn't been able to walk.  He fell several times yesterday.  He fell again last night and it took him a couple of hours to get to the living room and then laid half on/off the sofa for the next few hours.  He managed to stand this AM and walked to the bathroom and bedroom and his legs gave out again.  He started a new DM medication Sunday and he didn't eat at all Monday.   He denies drinking ETOH   Assessment & Plan:   Principal Problem:   Non-traumatic rhabdomyolysis Active Problems:   BPH without urinary obstruction   Diabetes mellitus type 2 in nonobese (HCC)   Dyslipidemia   Essential hypertension   Renal impairment   Visual impairment   * Non-traumatic rhabdomyolysis -[place] [address] with Cr 2.49, unknown baseline Cr -Likely related to recurrent falls with baseline limited physical activity, exacerbated by ACE -Renal function improved with IVF -CK is trending down -Clinically grossly edematous thus now holding IVF, encourage PO hydration   Renal impairment -Unclear baseline -Improved with IVF  -May resume ACE if renal function will support, avoid nephrotoxic agents for now -UA with >500 glucose and small blood -Renal US reviewed. No acute renal abnormality with multiple small bladder diverticula -Repeat bmet in AM   Visual impairment -Legally blind   Essential hypertension -Hold lisinopril per above -started low dose metoprolol per below, cont to titrate as tolerated    Dyslipidemia -Hold Zocor given elevated LFTs -LFT's trending down   Diabetes mellitus type 2 in nonobese (HCC) -A1c of 12.6 suggesting very poor control -Continued on sensitive-scale SSI -Discussed with Diabetic Coordinator. Pt is legally blind -On oral hyperglycemic meds prior to admit, likely suboptimal regimen -Recommendation to f/u closely with Endocrine when discharged   BPH -Continued on flomax -Renal US with findings of bladder diverticula  Bladder outlet obstruction -over 700cc noted on recent bladder scan, now with indwelling cath -Suspect neurogenic bladder in setting of poorly controlled DM in addition to above BPH -Recommend f/u with Urology as outpatient  Sinus tachycardia -HR into the 120's noted -Ordered and reviewed EKG, conssitent with sinus tach -Tolerating low dose metoprolol. Will increase dose to 25mg  bid -Have ordered PRN IV labetalol   DVT prophylaxis: Lovenox subq Code Status: DNR Family Communication: Pt in room, family not at bedside  Status is: Inpatient  Remains inpatient appropriate because: Severity of illness requiring IVF   Consultants:    Procedures:    Antimicrobials: Anti-infectives (From admission, onward)    None       Subjective: Denies chest pain or chest pain  Objective: Vitals:   02/11/21 2356 02/12/21 0801 02/12/21 1043 02/12/21 1203  BP: 108/76 (!) 139/95 (!) 146/88 132/68  Pulse: (!) 102 (!) 125 (!) 129 (!) 109  Resp: 20 (!) 22 (!) 24 (!) 24  Temp: 98.9 F (37.2 C) 99.3 F (37.4 C) 99.6 F (37.6  C) 99.4 F (37.4 C)  TempSrc: Oral Oral Oral Oral  SpO2: 100% 93% 90% 96%  Weight:      Height:        Intake/Output Summary (Last 24 hours) at 02/12/2021 1240 Last data filed at 02/12/2021 1218 Gross per 24 hour  Intake 1211 ml  Output 2350 ml  Net -1139 ml    Filed Weights   02/09/21 1112  Weight: 70.8 kg    Examination: General exam: Awake, laying in bed, in nad Respiratory system: Normal  respiratory effort, no wheezing Cardiovascular system: tachycardic, s1, s2 Gastrointestinal system: Soft, nondistended, positive BS Central nervous system: CN2-12 grossly intact, strength intact Extremities: Perfused, no clubbing Skin: Normal skin turgor, no notable skin lesions seen Psychiatry: Mood normal // no visual hallucinations   Data Reviewed: I have personally reviewed following labs and imaging studies  CBC: Recent Labs  Lab 02/09/21 1132 02/10/21 0446 02/11/21 0709 02/12/21 0454  WBC 8.0 7.8 7.5 8.6  NEUTROABS 6.0 5.5 5.4 6.3  HGB 14.6 12.7* 12.3* 12.1*  HCT 44.9 38.2* 37.2* 38.0*  MCV 90.0 89.0 90.1 92.9  PLT 228 211 191 035    Basic Metabolic Panel: Recent Labs  Lab 02/09/21 1132 02/10/21 0446 02/10/21 2010 02/11/21 0501 02/12/21 0454  NA 135 137 136 135 137  K 4.7 4.8 5.7* 5.1 5.7*  CL 102 108 105 108 104  CO2 20* 22 23 20* 23  GLUCOSE 253* 226* 267* 205* 263*  BUN 65* 54* 44* 39* 39*  CREATININE 2.49* 1.88* 1.98* 1.83* 1.69*  CALCIUM 8.8* 8.8* 8.8* 8.4* 8.6*  MG  --  2.0 2.0  --   --   PHOS  --  3.5 3.2  --   --     GFR: Estimated Creatinine Clearance: 41.8 mL/min (A) (by C-G formula based on SCr of 1.69 mg/dL (H)). Liver Function Tests: Recent Labs  Lab 02/09/21 1132 02/10/21 0446 02/10/21 2010 02/11/21 0501 02/12/21 0454  AST 188* 97* 76* 52* 27  ALT 63* 48* 51* 42 36  ALKPHOS 63 49 53 47 47  BILITOT 1.8* 1.2 1.4* 1.5* 1.1  PROT 7.3 5.2* 6.0* 5.1* 5.6*  ALBUMIN 4.0 2.8* 2.9* 2.6* 2.6*    No results for input(s): LIPASE, AMYLASE in the last 168 hours. No results for input(s): AMMONIA in the last 168 hours. Coagulation Profile: No results for input(s): INR, PROTIME in the last 168 hours. Cardiac Enzymes: Recent Labs  Lab 02/09/21 1132 02/11/21 0501 02/12/21 0454  CKTOTAL 19,190* 2,006* 572*    BNP (last 3 results) No results for input(s): PROBNP in the last 8760 hours. HbA1C: No results for input(s): HGBA1C in the last 72  hours.  CBG: Recent Labs  Lab 02/11/21 1214 02/11/21 1635 02/11/21 2141 02/12/21 0758 02/12/21 1126  GLUCAP 265* 233* 211* 227* 234*    Lipid Profile: No results for input(s): CHOL, HDL, LDLCALC, TRIG, CHOLHDL, LDLDIRECT in the last 72 hours. Thyroid Function Tests: Recent Labs    02/11/21 1628  TSH 1.304   Anemia Panel: No results for input(s): VITAMINB12, FOLATE, FERRITIN, TIBC, IRON, RETICCTPCT in the last 72 hours. Sepsis Labs: Recent Labs  Lab 02/09/21 1132  LATICACIDVEN 1.7     Recent Results (from the past 240 hour(s))  Resp Panel by RT-PCR (Flu A&B, Covid) Nasopharyngeal Swab     Status: None   Collection Time: 02/09/21 11:32 AM   Specimen: Nasopharyngeal Swab; Nasopharyngeal(NP) swabs in vial transport medium  Result Value Ref Range Status  SARS Coronavirus 2 by RT PCR NEGATIVE NEGATIVE Final    Comment: (NOTE) SARS-CoV-2 target nucleic acids are NOT DETECTED.  The SARS-CoV-2 RNA is generally detectable in upper respiratory specimens during the acute phase of infection. The lowest concentration of SARS-CoV-2 viral copies this assay can detect is 138 copies/mL. A negative result does not preclude SARS-Cov-2 infection and should not be used as the sole basis for treatment or other patient management decisions. A negative result may occur with  improper specimen collection/handling, submission of specimen other than nasopharyngeal swab, presence of viral mutation(s) within the areas targeted by this assay, and inadequate number of viral copies(<138 copies/mL). A negative result must be combined with clinical observations, patient history, and epidemiological information. The expected result is Negative.  Fact Sheet for Patients:  EntrepreneurPulse.com.au  Fact Sheet for Healthcare Providers:  IncredibleEmployment.be  This test is no t yet approved or cleared by the [place] FDA and  has been authorized for  detection and/or diagnosis of SARS-CoV-2 by FDA under an Emergency Use Authorization (EUA). This EUA will remain  in effect (meaning this test can be used) for the duration of the COVID-19 declaration under Section 564(b)(1) of the Act, 21 U.S.C.section 360bbb-3(b)(1), unless the authorization is terminated  or revoked sooner.       Influenza A by PCR NEGATIVE NEGATIVE Final   Influenza B by PCR NEGATIVE NEGATIVE Final    Comment: (NOTE) The Xpert Xpress SARS-CoV-2/FLU/RSV plus assay is intended as an aid in the diagnosis of influenza from Nasopharyngeal swab specimens and should not be used as a sole basis for treatment. Nasal washings and aspirates are unacceptable for Xpert Xpress SARS-CoV-2/FLU/RSV testing.  Fact Sheet for Patients: EntrepreneurPulse.com.au  Fact Sheet for Healthcare Providers: IncredibleEmployment.be  This test is not yet approved or cleared by the [place] FDA and has been authorized for detection and/or diagnosis of SARS-CoV-2 by FDA under an Emergency Use Authorization (EUA). This EUA will remain in effect (meaning this test can be used) for the duration of the COVID-19 declaration under Section 564(b)(1) of the Act, 21 U.S.C. section 360bbb-3(b)(1), unless the authorization is terminated or revoked.  Performed at [facility], [place] [address], [place], [place] [address]       Radiology Studies: US RENAL  Result Date: 02/11/2021 CLINICAL DATA:  Acute renal failure EXAM: RENAL / URINARY TRACT ULTRASOUND COMPLETE COMPARISON:  None. FINDINGS: Right Kidney: Renal measurements: 10.2 x 5.6 x 5.5 cm. = volume: 164 mL. Echogenicity within normal limits. No mass or hydronephrosis visualized. Left Kidney: Renal measurements: 11.0 x 5.6 x 7.0 cm. = volume: 25 mL. Echogenicity within normal limits. No mass or hydronephrosis visualized. Bladder: Bladder is well distended. Multiple small bladder diverticula  are noted. Other: None. IMPRESSION: No acute renal abnormality. Multiple small bladder diverticula. Electronically Signed   By: Inez Catalina M.D.   On: 02/11/2021 17:08   DG CHEST PORT 1 VIEW  Result Date: 02/11/2021 CLINICAL DATA:  Increasing shortness of breath. EXAM: PORTABLE CHEST 1 VIEW COMPARISON:  Chest radiograph 2 days ago 02/09/2021 FINDINGS: Lower lung volumes from prior exam. Stable heart size and mediastinal contours. Developing left pleural effusion with increasing streaky left lung base opacity. New bandlike opacity at the right lung base. Vascular congestion. No pneumothorax. Stable osseous structures. IMPRESSION: 1. Developing left pleural effusion with increasing streaky left lung base opacity, atelectasis versus pneumonia. 2. New bandlike opacity at the right lung base, typical of atelectasis. Electronically Signed   By: Threasa Beards  Sanford M.D.   On: 02/11/2021 20:21    Scheduled Meds:  amitriptyline  50 mg Oral QHS   aspirin EC  81 mg Oral Daily   Chlorhexidine Gluconate Cloth  6 each Topical Daily   enoxaparin (LOVENOX) injection  40 mg Subcutaneous Q24H   gabapentin  300 mg Oral TID   insulin aspart  0-5 Units Subcutaneous QHS   insulin aspart  0-9 Units Subcutaneous TID WC   metoprolol tartrate  12.5 mg Oral Once   metoprolol tartrate  25 mg Oral BID   sodium chloride flush  3 mL Intravenous Q12H   tamsulosin  0.4 mg Oral BID   Continuous Infusions:  methocarbamol (ROBAXIN) IV       LOS: 3 days   Marylu Lund, MD [facility] Pager On Amion  If 7PM-7AM, please contact night-coverage 02/12/2021, 12:40 PM

## 2021-02-13 LAB — COMPREHENSIVE METABOLIC PANEL
ALT: 27 U/L (ref 0–44)
AST: 17 U/L (ref 15–41)
Albumin: 2.2 g/dL — ABNORMAL LOW (ref 3.5–5.0)
Alkaline Phosphatase: 40 U/L (ref 38–126)
Anion gap: 6 (ref 5–15)
BUN: 27 mg/dL — ABNORMAL HIGH (ref 8–23)
CO2: 26 mmol/L (ref 22–32)
Calcium: 8.2 mg/dL — ABNORMAL LOW (ref 8.9–10.3)
Chloride: 103 mmol/L (ref 98–111)
Creatinine, Ser: 1.03 mg/dL (ref 0.61–1.24)
GFR, Estimated: >60 mL/min (ref >60)
Glucose, Bld: 225 mg/dL — ABNORMAL HIGH (ref 70–99)
Potassium: 4 mmol/L (ref 3.5–5.1)
Sodium: 135 mmol/L (ref 135–145)
Total Bilirubin: 1 mg/dL (ref 0.3–1.2)
Total Protein: 4.9 g/dL — ABNORMAL LOW (ref 6.5–8.1)

## 2021-02-13 LAB — GLUCOSE, CAPILLARY
Glucose-Capillary: 210 mg/dL — ABNORMAL HIGH (ref 70–99)
Glucose-Capillary: 295 mg/dL — ABNORMAL HIGH (ref 70–99)
Glucose-Capillary: 217 mg/dL — ABNORMAL HIGH (ref 70–99)
Glucose-Capillary: 316 mg/dL — ABNORMAL HIGH (ref 70–99)

## 2021-02-13 LAB — CBC
HCT: 32.4 % — ABNORMAL LOW (ref 39.0–52.0)
Hemoglobin: 10.5 g/dL — ABNORMAL LOW (ref 13.0–17.0)
MCH: 29.6 pg (ref 26.0–34.0)
MCHC: 32.4 g/dL (ref 30.0–36.0)
MCV: 91.3 fL (ref 80.0–100.0)
Platelets: 190 10*3/uL (ref 150–400)
RBC: 3.55 MIL/uL — ABNORMAL LOW (ref 4.22–5.81)
RDW: 13.9 % (ref 11.5–15.5)
WBC: 6.5 10*3/uL (ref 4.0–10.5)
nRBC: 0 % (ref 0.0–0.2)

## 2021-02-13 MED ORDER — SODIUM CHLORIDE 0.9 % IV BOLUS
500.0000 mL | Freq: Once | INTRAVENOUS | Status: AC
  Administered 2021-02-13: 500 mL via INTRAVENOUS

## 2021-02-13 MED ORDER — SIMVASTATIN 10 MG PO TABS
20.0000 mg | ORAL_TABLET | Freq: Every evening | ORAL | Status: DC
  Administered 2021-02-13 – 2021-02-14 (×2): 20 mg via ORAL
  Filled 2021-02-13 (×3): qty 2

## 2021-02-13 MED ORDER — DICLOFENAC SODIUM 1 % EX GEL
2.0000 g | Freq: Four times a day (QID) | CUTANEOUS | Status: DC
  Filled 2021-02-13: qty 100

## 2021-02-13 MED ORDER — DICLOFENAC SODIUM 1 % EX GEL
2.0000 g | Freq: Four times a day (QID) | CUTANEOUS | Status: DC
  Administered 2021-02-14 – 2021-02-15 (×4): 2 g via TOPICAL

## 2021-02-13 NOTE — Progress Notes (Signed)
The patient is injury-free, afebrile, alert, and oriented X 3. Pt's VS within the baseline. Pt denies chest pain, SOB, nausea, vomiting, dizziness, signs or symptoms of bleeding or infection or acute changes during this shift. We will continue to monitor and work toward achieving the care plan goals

## 2021-02-13 NOTE — Progress Notes (Signed)
PROGRESS NOTE    Dustin Duffy  BTD:[id number] DOB: 09-06-1955 DOA: 02/09/2021 PCP: Janie Morning, DO    Brief Narrative:  65 y.o. male with medical history significant of DM; BPH; HLD; HTN; and CKD presenting with weakness, falls.  He reports that he has been having problems with his R leg, had a remote injury.  It has been hurting a lot in the last few years.  He developed DM a few years ago and he has lost more of his vision.  Both of his legs have been hurting the last few days and he hasn't been able to walk.  He fell several times yesterday.  He fell again last night and it took him a couple of hours to get to the living room and then laid half on/off the sofa for the next few hours.  He managed to stand this AM and walked to the bathroom and bedroom and his legs gave out again.  He started a new DM medication Sunday and he didn't eat at all Monday.   He denies drinking ETOH   Assessment & Plan:   Principal Problem:   Non-traumatic rhabdomyolysis Active Problems:   BPH without urinary obstruction   Diabetes mellitus type 2 in nonobese (HCC)   Dyslipidemia   Essential hypertension   Renal impairment   Visual impairment   * Non-traumatic [place]habdomyolysis -[place] [address] with Cr 2.49, unknown baseline Cr -Likely related to recurrent falls with baseline limited physical activity, exacerbated by ACE -Renal function improved with IVF -CK has trended down to 572 -Held further IVF, encourage self-hydration   Renal impairment -Unclear baseline -Now much improved with IVF and foley cath placement -UA with >500 glucose and small blood -Renal US reviewed. No acute renal abnormality with multiple small bladder diverticula -Recheck basic metabolic panel in the morning   Visual impairment -Legally blind   Essential hypertension -Hold lisinopril per above -Titrated up metoprolol dose as per below, blood pressure stable   Dyslipidemia -Hold Zocor given elevated LFTs -LFTs have  been trending down   Diabetes mellitus type 2 in nonobese (HCC) -A1c of 12.6 suggesting very poor control -Continued on sensitive-scale SSI -Discussed with Diabetic Coordinator. Pt is legally blind -On oral hyperglycemic meds prior to admit, likely suboptimal regimen -Recommendation to f/u closely with Endocrine when discharged   BPH -Continued on flomax -Renal US with findings of bladder diverticula  Bladder outlet obstruction -over 700cc noted on recent bladder scan, now with indwelling cath -Suspect neurogenic bladder in setting of poorly controlled DM in addition to above BPH -We will discharge with Foley catheter in place at time of discharge and follow-up with urology as outpatient  Sinus tachycardia -Recently noted to have heart rates into the 120s -EKG consistent with sinus tachycardia -Heart rate now much improved with metoprolol 25 mg p.o. twice daily   DVT prophylaxis: Lovenox subq Code Status: DNR Family Communication: Pt in room, family not at bedside  Status is: Inpatient  Remains inpatient appropriate because: Severity of illness requiring IVF   Consultants:    Procedures:    Antimicrobials: Anti-infectives (From admission, onward)    None       Subjective: Reports increased congestion today, otherwise denies abdominal pain or shortness of breath  Objective: Vitals:   02/13/21 0024 02/13/21 0426 02/13/21 1022 02/13/21 1326  BP: 120/65 112/62 111/77 106/63  Pulse: (!) 109 99 (!) 107 (!) 103  Resp: 20 (!) 24  18  Temp: 99.3 F (37.4 C) 98.8 F (37.1  C)  (!) 97.1 F (36.2 C)  TempSrc: Oral Oral    SpO2: 93% 92%  91%  Weight:      Height:        Intake/Output Summary (Last 24 hours) at 02/13/2021 1340 Last data filed at 02/13/2021 1230 Gross per 24 hour  Intake 1195 ml  Output 1800 ml  Net -605 ml    Filed Weights   02/09/21 1112  Weight: 70.8 kg    Examination: General exam: Conversant, in no acute distress Respiratory system:  normal chest rise, clear, no audible wheezing Cardiovascular system: regular rhythm, s1-s2 Gastrointestinal system: Nondistended, nontender, pos BS Central nervous system: No seizures, no tremors Extremities: No cyanosis, no joint deformities Skin: No rashes, no pallor Psychiatry: Affect normal // no auditory hallucinations   Data Reviewed: I have personally reviewed following labs and imaging studies  CBC: Recent Labs  Lab 02/09/21 1132 02/10/21 0446 02/11/21 0709 02/12/21 0454 02/13/21 0507  WBC 8.0 7.8 7.5 8.6 6.5  NEUTROABS 6.0 5.5 5.4 6.3  --   HGB 14.6 12.7* 12.3* 12.1* 10.5*  HCT 44.9 38.2* 37.2* 38.0* 32.4*  MCV 90.0 89.0 90.1 92.9 91.3  PLT 228 211 191 211 161    Basic Metabolic Panel: Recent Labs  Lab 02/10/21 0446 02/10/21 2010 02/11/21 0501 02/12/21 0454 02/13/21 0507  NA 137 136 135 137 135  K 4.8 5.7* 5.1 5.7* 4.0  CL 108 105 108 104 103  CO2 22 23 20* 23 26  GLUCOSE 226* 267* 205* 263* 225*  BUN 54* 44* 39* 39* 27*  CREATININE 1.88* 1.98* 1.83* 1.69* 1.03  CALCIUM 8.8* 8.8* 8.4* 8.6* 8.2*  MG 2.0 2.0  --   --   --   PHOS 3.5 3.2  --   --   --     GFR: Estimated Creatinine Clearance: 68.5 mL/min (by C-G formula based on SCr of 1.03 mg/dL). Liver Function Tests: Recent Labs  Lab 02/10/21 0446 02/10/21 2010 02/11/21 0501 02/12/21 0454 02/13/21 0507  AST 97* 76* 52* 27 17  ALT 48* 51* 42 36 27  ALKPHOS 49 53 47 47 40  BILITOT 1.2 1.4* 1.5* 1.1 1.0  PROT 5.2* 6.0* 5.1* 5.6* 4.9*  ALBUMIN 2.8* 2.9* 2.6* 2.6* 2.2*    No results for input(s): LIPASE, AMYLASE in the last 168 hours. No results for input(s): AMMONIA in the last 168 hours. Coagulation Profile: No results for input(s): INR, PROTIME in the last 168 hours. Cardiac Enzymes: Recent Labs  Lab 02/09/21 1132 02/11/21 0501 02/12/21 0454  CKTOTAL 19,190* 2,006* 572*    BNP (last 3 results) No results for input(s): PROBNP in the last 8760 hours. HbA1C: No results for input(s):  HGBA1C in the last 72 hours.  CBG: Recent Labs  Lab 02/12/21 1126 02/12/21 1649 02/12/21 2149 02/13/21 0735 02/13/21 1133  GLUCAP 234* 232* 270* 210* 295*    Lipid Profile: No results for input(s): CHOL, HDL, LDLCALC, TRIG, CHOLHDL, LDLDIRECT in the last 72 hours. Thyroid Function Tests: Recent Labs    02/11/21 1628  TSH 1.304    Anemia Panel: No results for input(s): VITAMINB12, FOLATE, FERRITIN, TIBC, IRON, RETICCTPCT in the last 72 hours. Sepsis Labs: Recent Labs  Lab 02/09/21 1132  LATICACIDVEN 1.7     Recent Results (from the past 240 hour(s))  Resp Panel by RT-PCR (Flu A&B, Covid) Nasopharyngeal Swab     Status: None   Collection Time: 02/09/21 11:32 AM   Specimen: Nasopharyngeal Swab; Nasopharyngeal(NP) swabs in vial transport  medium  Result Value Ref Range Status   SARS Coronavirus 2 by RT PCR NEGATIVE NEGATIVE Final    Comment: (NOTE) SARS-CoV-2 target nucleic acids are NOT DETECTED.  The SARS-CoV-2 RNA is generally detectable in upper respiratory specimens during the acute phase of infection. The lowest concentration of SARS-CoV-2 viral copies this assay can detect is 138 copies/mL. A negative result does not preclude SARS-Cov-2 infection and should not be used as the sole basis for treatment or other patient management decisions. A negative result may occur with  improper specimen collection/handling, submission of specimen other than nasopharyngeal swab, presence of viral mutation(s) within the areas targeted by this assay, and inadequate number of viral copies(<138 copies/mL). A negative result must be combined with clinical observations, patient history, and epidemiological information. The expected result is Negative.  Fact Sheet for Patients:  EntrepreneurPulse.com.au  Fact Sheet for Healthcare Providers:  IncredibleEmployment.be  This test is no t yet approved or cleared by the [place] FDA and  has  been authorized for detection and/or diagnosis of SARS-CoV-2 by FDA under an Emergency Use Authorization (EUA). This EUA will remain  in effect (meaning this test can be used) for the duration of the COVID-19 declaration under Section 564(b)(1) of the Act, 21 U.S.C.section 360bbb-3(b)(1), unless the authorization is terminated  or revoked sooner.       Influenza A by PCR NEGATIVE NEGATIVE Final   Influenza B by PCR NEGATIVE NEGATIVE Final    Comment: (NOTE) The Xpert Xpress SARS-CoV-2/FLU/RSV plus assay is intended as an aid in the diagnosis of influenza from Nasopharyngeal swab specimens and should not be used as a sole basis for treatment. Nasal washings and aspirates are unacceptable for Xpert Xpress SARS-CoV-2/FLU/RSV testing.  Fact Sheet for Patients: EntrepreneurPulse.com.au  Fact Sheet for Healthcare Providers: IncredibleEmployment.be  This test is not yet approved or cleared by the [place] FDA and has been authorized for detection and/or diagnosis of SARS-CoV-2 by FDA under an Emergency Use Authorization (EUA). This EUA will remain in effect (meaning this test can be used) for the duration of the COVID-19 declaration under Section 564(b)(1) of the Act, 21 U.S.C. section 360bbb-3(b)(1), unless the authorization is terminated or revoked.  Performed at [facility], [place] [address] [place], [place] [address]       Radiology Studies: US RENAL  Result Date: 02/11/2021 CLINICAL DATA:  Acute renal failure EXAM: RENAL / URINARY TRACT ULTRASOUND COMPLETE COMPARISON:  None. FINDINGS: Right Kidney: Renal measurements: 10.2 x 5.6 x 5.5 cm. = volume: 164 mL. Echogenicity within normal limits. No mass or hydronephrosis visualized. Left Kidney: Renal measurements: 11.0 x 5.6 x 7.0 cm. = volume: 25 mL. Echogenicity within normal limits. No mass or hydronephrosis visualized. Bladder: Bladder is well distended. Multiple small  bladder diverticula are noted. Other: None. IMPRESSION: No acute renal abnormality. Multiple small bladder diverticula. Electronically Signed   By: Inez Catalina M.D.   On: 02/11/2021 17:08   DG CHEST PORT 1 VIEW  Result Date: 02/11/2021 CLINICAL DATA:  Increasing shortness of breath. EXAM: PORTABLE CHEST 1 VIEW COMPARISON:  Chest radiograph 2 days ago 02/09/2021 FINDINGS: Lower lung volumes from prior exam. Stable heart size and mediastinal contours. Developing left pleural effusion with increasing streaky left lung base opacity. New bandlike opacity at the right lung base. Vascular congestion. No pneumothorax. Stable osseous structures. IMPRESSION: 1. Developing left pleural effusion with increasing streaky left lung base opacity, atelectasis versus pneumonia. 2. New bandlike opacity at the right lung base,  typical of atelectasis. Electronically Signed   By: Keith Rake M.D.   On: 02/11/2021 20:21   ECHOCARDIOGRAM COMPLETE  Result Date: 02/12/2021    ECHOCARDIOGRAM REPORT   Patient Name:   Dustin Duffy Date of Exam: 02/12/2021 Medical Rec #:  [id number]   Height:       68.5 in Accession #:    [id number]  Weight:       156.0 lb Date of Birth:  1956-02-12   BSA:          1.849 m Patient Age:    18 years    BP:           108/76 mmHg Patient Gender: M           HR:           105 bpm. Exam Location:  Inpatient Procedure: 2D Echo, Cardiac Doppler and Color Doppler Indications:    CHF  History:        Patient has no prior history of Echocardiogram examinations.                 Risk Factors:Diabetes and Hypertension.  Sonographer:    Glo Herring Referring Phys: [place]  1. Left ventricular ejection fraction, by estimation, is 65 to 70%. The left ventricle has normal function. The left ventricle has no regional wall motion abnormalities. Left ventricular diastolic parameters are consistent with Grade I diastolic dysfunction (impaired relaxation).  2. Right ventricular systolic  function is normal. The right ventricular size is normal. There is normal pulmonary artery systolic pressure.  3. The mitral valve is normal in structure. No evidence of mitral valve regurgitation. No evidence of mitral stenosis.  4. The aortic valve is tricuspid. There is mild calcification of the aortic valve. There is mild thickening of the aortic valve. Aortic valve regurgitation is not visualized. Aortic valve sclerosis is present, with no evidence of aortic valve stenosis. Aortic valve mean gradient measures 5.0 mmHg. Aortic valve Vmax measures 1.38 m/s.  5. The inferior vena cava is dilated in size with >50% respiratory variability, suggesting right atrial pressure of 8 mmHg. FINDINGS  Left Ventricle: Left ventricular ejection fraction, by estimation, is 65 to 70%. The left ventricle has normal function. The left ventricle has no regional wall motion abnormalities. The left ventricular internal cavity size was normal in size. There is  no left ventricular hypertrophy. Left ventricular diastolic parameters are consistent with Grade I diastolic dysfunction (impaired relaxation). Indeterminate filling pressures. Right Ventricle: The right ventricular size is normal. No increase in right ventricular wall thickness. Right ventricular systolic function is normal. There is normal pulmonary artery systolic pressure. The tricuspid regurgitant velocity is 1.85 m/s, and  with an assumed right atrial pressure of 8 mmHg, the estimated right ventricular systolic pressure is 23.5 mmHg. Left Atrium: Left atrial size was normal in size. Right Atrium: Right atrial size was normal in size. Pericardium: There is no evidence of pericardial effusion. Mitral Valve: The mitral valve is normal in structure. No evidence of mitral valve regurgitation. No evidence of mitral valve stenosis. Tricuspid Valve: The tricuspid valve is normal in structure. Tricuspid valve regurgitation is trivial. No evidence of tricuspid stenosis. Aortic  Valve: The aortic valve is tricuspid. There is mild calcification of the aortic valve. There is mild thickening of the aortic valve. Aortic valve regurgitation is not visualized. Aortic valve sclerosis is present, with no evidence of aortic valve stenosis. Aortic valve mean gradient measures 5.0 mmHg. Aortic  valve peak gradient measures 7.6 mmHg. Aortic valve area, by VTI measures 3.16 cm. Pulmonic Valve: The pulmonic valve was normal in structure. Pulmonic valve regurgitation is not visualized. No evidence of pulmonic stenosis. Aorta: The aortic root is normal in size and structure. Venous: The inferior vena cava is dilated in size with greater than 50% respiratory variability, suggesting right atrial pressure of 8 mmHg. IAS/Shunts: No atrial level shunt detected by color flow Doppler.  LEFT VENTRICLE PLAX 2D LVIDd:         3.50 cm   Diastology LVIDs:         2.20 cm   LV e' medial:    6.42 cm/s LV PW:         1.00 cm   LV E/e' medial:  11.7 LV IVS:        1.00 cm   LV e' lateral:   7.62 cm/s LVOT diam:     2.10 cm   LV E/e' lateral: 9.8 LV SV:         77 LV SV Index:   42 LVOT Area:     3.46 cm  RIGHT VENTRICLE             IVC RV Basal diam:  3.30 cm     IVC diam: 2.10 cm RV S prime:     17.00 cm/s LEFT ATRIUM             Index        RIGHT ATRIUM           Index LA diam:        4.30 cm 2.33 cm/m   RA Area:     13.10 cm LA Vol (A2C):   25.1 ml 13.58 ml/m  RA Volume:   27.40 ml  14.82 ml/m LA Vol (A4C):   26.2 ml 14.17 ml/m LA Biplane Vol: 28.4 ml 15.36 ml/m  AORTIC VALVE                     PULMONIC VALVE AV Area (Vmax):    3.14 cm      PV Vmax:       0.99 m/s AV Area (Vmean):   2.99 cm      PV Peak grad:  3.9 mmHg AV Area (VTI):     3.16 cm AV Vmax:           138.00 cm/s AV Vmean:          108.000 cm/s AV VTI:            0.243 m AV Peak Grad:      7.6 mmHg AV Mean Grad:      5.0 mmHg LVOT Vmax:         125.00 cm/s LVOT Vmean:        93.100 cm/s LVOT VTI:          0.222 m LVOT/AV VTI ratio: 0.91  AORTA  Ao Root diam: 3.40 cm Ao Asc diam:  2.80 cm MITRAL VALVE               TRICUSPID VALVE MV Area (PHT): 5.31 cm    TR Peak grad:   13.7 mmHg MV Decel Time: 143 msec    TR Vmax:        185.00 cm/s MV E velocity: 74.90 cm/s MV A velocity: 91.30 cm/s  SHUNTS MV E/A ratio:  0.82        Systemic VTI:  0.22 m  Systemic Diam: 2.10 cm Skeet Latch MD Electronically signed by Skeet Latch MD Signature Date/Time: 02/12/2021/5:18:59 PM    Final     Scheduled Meds:  amitriptyline  50 mg Oral QHS   aspirin EC  81 mg Oral Daily   Chlorhexidine Gluconate Cloth  6 each Topical Daily   enoxaparin (LOVENOX) injection  40 mg Subcutaneous Q24H   gabapentin  300 mg Oral TID   insulin aspart  0-5 Units Subcutaneous QHS   insulin aspart  0-9 Units Subcutaneous TID WC   metoprolol tartrate  25 mg Oral BID   sodium chloride flush  3 mL Intravenous Q12H   tamsulosin  0.4 mg Oral BID   Continuous Infusions:  methocarbamol (ROBAXIN) IV       LOS: 4 days   Marylu Lund, MD [facility] Pager On Amion  If 7PM-7AM, please contact night-coverage 02/13/2021, 1:40 PM

## 2021-02-13 NOTE — Plan of Care (Signed)

## 2021-02-14 LAB — COMPREHENSIVE METABOLIC PANEL
ALT: 23 U/L (ref 0–44)
AST: 13 U/L — ABNORMAL LOW (ref 15–41)
Albumin: 2 g/dL — ABNORMAL LOW (ref 3.5–5.0)
Alkaline Phosphatase: 35 U/L — ABNORMAL LOW (ref 38–126)
Anion gap: 5 (ref 5–15)
BUN: 25 mg/dL — ABNORMAL HIGH (ref 8–23)
CO2: 28 mmol/L (ref 22–32)
Calcium: 8.4 mg/dL — ABNORMAL LOW (ref 8.9–10.3)
Chloride: 101 mmol/L (ref 98–111)
Creatinine, Ser: 0.95 mg/dL (ref 0.61–1.24)
GFR, Estimated: >60 mL/min (ref >60)
Glucose, Bld: 239 mg/dL — ABNORMAL HIGH (ref 70–99)
Potassium: 4 mmol/L (ref 3.5–5.1)
Sodium: 134 mmol/L — ABNORMAL LOW (ref 135–145)
Total Bilirubin: 0.8 mg/dL (ref 0.3–1.2)
Total Protein: 4.6 g/dL — ABNORMAL LOW (ref 6.5–8.1)

## 2021-02-14 LAB — GLUCOSE, CAPILLARY
Glucose-Capillary: 221 mg/dL — ABNORMAL HIGH (ref 70–99)
Glucose-Capillary: 247 mg/dL — ABNORMAL HIGH (ref 70–99)
Glucose-Capillary: 244 mg/dL — ABNORMAL HIGH (ref 70–99)
Glucose-Capillary: 192 mg/dL — ABNORMAL HIGH (ref 70–99)

## 2021-02-14 MED ORDER — GLIPIZIDE 5 MG PO TABS
2.5000 mg | ORAL_TABLET | Freq: Every day | ORAL | Status: DC
  Administered 2021-02-14 – 2021-02-15 (×2): 2.5 mg via ORAL
  Filled 2021-02-14 (×2): qty 1

## 2021-02-14 NOTE — Progress Notes (Signed)
PROGRESS NOTE    Dustin Duffy  UJW:[id number] DOB: Aug 25, 1955 DOA: 02/09/2021 PCP: Janie Morning, DO    Brief Narrative:  65 y.o. male with medical history significant of DM; BPH; HLD; HTN; and CKD presenting with weakness, falls.  He reports that he has been having problems with his R leg, had a remote injury.  It has been hurting a lot in the last few years.  He developed DM a few years ago and he has lost more of his vision.  Both of his legs have been hurting the last few days and he hasn't been able to walk.  He fell several times yesterday.  He fell again last night and it took him a couple of hours to get to the living room and then laid half on/off the sofa for the next few hours.  He managed to stand this AM and walked to the bathroom and bedroom and his legs gave out again.  He started a new DM medication Sunday and he didn't eat at all Monday.   He denies drinking ETOH   Assessment & Plan:   Principal Problem:   Non-traumatic rhabdomyolysis Active Problems:   BPH without urinary obstruction   Diabetes mellitus type 2 in nonobese (HCC)   Dyslipidemia   Essential hypertension   Renal impairment   Visual impairment   * Non-traumatic [place]habdomyolysis -[place] [address] with Cr 2.49, unknown baseline Cr -Likely related to recurrent falls with baseline limited physical activity, exacerbated by ACE -Renal function has normalized with IVF and cath placement -CK has trended down to 572 -Held further IVF, encourage self-hydration   Renal impairment -Unclear baseline -Now normalized after IVF and foley cath placement -UA with >500 glucose and small blood -Renal US reviewed. No acute renal abnormality with multiple small bladder diverticula -repeat bmet in AM   Visual impairment -Legally blind   Essential hypertension -Hold lisinopril per above -Titrated up metoprolol dose as per below, blood pressure stable   Dyslipidemia -Held Zocor given elevated LFTs -LFTs have been  trending down -Have resumed home statin   Diabetes mellitus type 2 in nonobese (HCC) -A1c of 12.6 suggesting very poor control -Continued on sensitive-scale SSI -Discussed with Diabetic Coordinator. Pt is legally blind -On oral hyperglycemic meds prior to admit, likely suboptimal regimen -Recommendation to f/u closely with Endocrine when discharged   BPH -Continued on flomax -Renal US with findings of bladder diverticula  Bladder outlet obstruction -over 700cc noted on recent bladder scan, now with indwelling cath -Suspect neurogenic bladder in setting of poorly controlled DM in addition to above BPH -We will discharge with Foley catheter in place at time of discharge -recommend f/u with Urology as outpatient  Sinus tachycardia -Recently noted to have heart rates into the 120s -EKG consistent with sinus tachycardia -Heart rate now much improved with metoprolol 25 mg p.o. twice daily   DVT prophylaxis: Lovenox subq Code Status: DNR Family Communication: Pt in room, family not at bedside  Status is: Inpatient  Remains inpatient appropriate because: Severity of illness requiring IVF   Consultants:    Procedures:    Antimicrobials: Anti-infectives (From admission, onward)    None       Subjective: Without complaints. Eager to start rehab  Objective: Vitals:   02/14/21 0000 02/14/21 0620 02/14/21 0812 02/14/21 1339  BP: 96/63 114/67 119/75 108/74  Pulse:  94 (!) 106 96  Resp:  20  19  Temp:  97.6 F (36.4 C)  97.6 F (36.4 C)  TempSrc:  Oral    SpO2:  94%  95%  Weight:      Height:        Intake/Output Summary (Last 24 hours) at 02/14/2021 1545 Last data filed at 02/14/2021 1300 Gross per 24 hour  Intake 960 ml  Output 2800 ml  Net -1840 ml    Filed Weights   02/09/21 1112  Weight: 70.8 kg    Examination: General exam: Awake, laying in bed, in nad Respiratory system: Normal respiratory effort, no wheezing Cardiovascular system: regular rate,  s1, s2 Gastrointestinal system: Soft, nondistended, positive BS Central nervous system: CN2-12 grossly intact, strength intact Extremities: Perfused, no clubbing Skin: Normal skin turgor, no notable skin lesions seen Psychiatry: Mood normal // no visual hallucinations   Data Reviewed: I have personally reviewed following labs and imaging studies  CBC: Recent Labs  Lab 02/09/21 1132 02/10/21 0446 02/11/21 0709 02/12/21 0454 02/13/21 0507  WBC 8.0 7.8 7.5 8.6 6.5  NEUTROABS 6.0 5.5 5.4 6.3  --   HGB 14.6 12.7* 12.3* 12.1* 10.5*  HCT 44.9 38.2* 37.2* 38.0* 32.4*  MCV 90.0 89.0 90.1 92.9 91.3  PLT 228 211 191 211 811    Basic Metabolic Panel: Recent Labs  Lab 02/10/21 0446 02/10/21 2010 02/11/21 0501 02/12/21 0454 02/13/21 0507 02/14/21 0528  NA 137 136 135 137 135 134*  K 4.8 5.7* 5.1 5.7* 4.0 4.0  CL 108 105 108 104 103 101  CO2 22 23 20* 23 26 28   GLUCOSE 226* 267* 205* 263* 225* 239*  BUN 54* 44* 39* 39* 27* 25*  CREATININE 1.88* 1.98* 1.83* 1.69* 1.03 0.95  CALCIUM 8.8* 8.8* 8.4* 8.6* 8.2* 8.4*  MG 2.0 2.0  --   --   --   --   PHOS 3.5 3.2  --   --   --   --     GFR: Estimated Creatinine Clearance: 74.3 mL/min (by C-G formula based on SCr of 0.95 mg/dL). Liver Function Tests: Recent Labs  Lab 02/10/21 2010 02/11/21 0501 02/12/21 0454 02/13/21 0507 02/14/21 0528  AST 76* 52* 27 17 13*  ALT 51* 42 36 27 23  ALKPHOS 53 47 47 40 35*  BILITOT 1.4* 1.5* 1.1 1.0 0.8  PROT 6.0* 5.1* 5.6* 4.9* 4.6*  ALBUMIN 2.9* 2.6* 2.6* 2.2* 2.0*    No results for input(s): LIPASE, AMYLASE in the last 168 hours. No results for input(s): AMMONIA in the last 168 hours. Coagulation Profile: No results for input(s): INR, PROTIME in the last 168 hours. Cardiac Enzymes: Recent Labs  Lab 02/09/21 1132 02/11/21 0501 02/12/21 0454  CKTOTAL 19,190* 2,006* 572*    BNP (last 3 results) No results for input(s): PROBNP in the last 8760 hours. HbA1C: No results for input(s):  HGBA1C in the last 72 hours.  CBG: Recent Labs  Lab 02/13/21 1133 02/13/21 1607 02/13/21 2028 02/14/21 0732 02/14/21 1129  GLUCAP 295* 217* 316* 221* 247*    Lipid Profile: No results for input(s): CHOL, HDL, LDLCALC, TRIG, CHOLHDL, LDLDIRECT in the last 72 hours. Thyroid Function Tests: Recent Labs    02/11/21 1628  TSH 1.304    Anemia Panel: No results for input(s): VITAMINB12, FOLATE, FERRITIN, TIBC, IRON, RETICCTPCT in the last 72 hours. Sepsis Labs: Recent Labs  Lab 02/09/21 1132  LATICACIDVEN 1.7     Recent Results (from the past 240 hour(s))  Resp Panel by RT-PCR (Flu A&B, Covid) Nasopharyngeal Swab     Status: None   Collection Time: 02/09/21 11:32 AM  Specimen: Nasopharyngeal Swab; Nasopharyngeal(NP) swabs in vial transport medium  Result Value Ref Range Status   SARS Coronavirus 2 by RT PCR NEGATIVE NEGATIVE Final    Comment: (NOTE) SARS-CoV-2 target nucleic acids are NOT DETECTED.  The SARS-CoV-2 RNA is generally detectable in upper respiratory specimens during the acute phase of infection. The lowest concentration of SARS-CoV-2 viral copies this assay can detect is 138 copies/mL. A negative result does not preclude SARS-Cov-2 infection and should not be used as the sole basis for treatment or other patient management decisions. A negative result may occur with  improper specimen collection/handling, submission of specimen other than nasopharyngeal swab, presence of viral mutation(s) within the areas targeted by this assay, and inadequate number of viral copies(<138 copies/mL). A negative result must be combined with clinical observations, patient history, and epidemiological information. The expected result is Negative.  Fact Sheet for Patients:  EntrepreneurPulse.com.au  Fact Sheet for Healthcare Providers:  IncredibleEmployment.be  This test is no t yet approved or cleared by the [place] FDA and  has  been authorized for detection and/or diagnosis of SARS-CoV-2 by FDA under an Emergency Use Authorization (EUA). This EUA will remain  in effect (meaning this test can be used) for the duration of the COVID-19 declaration under Section 564(b)(1) of the Act, 21 U.S.C.section 360bbb-3(b)(1), unless the authorization is terminated  or revoked sooner.       Influenza A by PCR NEGATIVE NEGATIVE Final   Influenza B by PCR NEGATIVE NEGATIVE Final    Comment: (NOTE) The Xpert Xpress SARS-CoV-2/FLU/RSV plus assay is intended as an aid in the diagnosis of influenza from Nasopharyngeal swab specimens and should not be used as a sole basis for treatment. Nasal washings and aspirates are unacceptable for Xpert Xpress SARS-CoV-2/FLU/RSV testing.  Fact Sheet for Patients: EntrepreneurPulse.com.au  Fact Sheet for Healthcare Providers: IncredibleEmployment.be  This test is not yet approved or cleared by the [place] FDA and has been authorized for detection and/or diagnosis of SARS-CoV-2 by FDA under an Emergency Use Authorization (EUA). This EUA will remain in effect (meaning this test can be used) for the duration of the COVID-19 declaration under Section 564(b)(1) of the Act, 21 U.S.C. section 360bbb-3(b)(1), unless the authorization is terminated or revoked.  Performed at [facility], [place] [address] [place], [place] [address]       Radiology Studies: No results found.  Scheduled Meds:  amitriptyline  50 mg Oral QHS   aspirin EC  81 mg Oral Daily   Chlorhexidine Gluconate Cloth  6 each Topical Daily   diclofenac Sodium  2 g Topical QID   enoxaparin (LOVENOX) injection  40 mg Subcutaneous Q24H   gabapentin  300 mg Oral TID   glipiZIDE  2.5 mg Oral QAC breakfast   insulin aspart  0-5 Units Subcutaneous QHS   insulin aspart  0-9 Units Subcutaneous TID WC   metoprolol tartrate  25 mg Oral BID   simvastatin  20 mg Oral QPM    sodium chloride flush  3 mL Intravenous Q12H   tamsulosin  0.4 mg Oral BID   Continuous Infusions:  methocarbamol (ROBAXIN) IV       LOS: 5 days   Marylu Lund, MD [facility] Pager On Amion  If 7PM-7AM, please contact night-coverage 02/14/2021, 3:45 PM

## 2021-02-14 NOTE — Plan of Care (Signed)

## 2021-02-14 NOTE — Progress Notes (Signed)
The patient is injury-free, afebrile, alert, and oriented X 3. BP was on the low side, and improved with 500 mls bolus. The rest of VS within the baseline. Pt denies chest pain, SOB, nausea, vomiting, dizziness, signs or symptoms of bleeding or infection or acute changes during this shift. We will continue to monitor and work toward achieving the care plan goals

## 2021-02-14 NOTE — Progress Notes (Signed)
Pt's BP was 87/51, the hospitalist on call was notified and ordered 500 mls of NS bolus. BP after intervention improved to 96/63 (the hospitalist was notified).

## 2021-02-15 DIAGNOSIS — I129 Hypertensive chronic kidney disease with stage 1 through stage 4 chronic kidney disease, or unspecified chronic kidney disease: Secondary | ICD-10-CM | POA: Diagnosis not present

## 2021-02-15 DIAGNOSIS — M6282 Rhabdomyolysis: Secondary | ICD-10-CM | POA: Diagnosis not present

## 2021-02-15 DIAGNOSIS — E785 Hyperlipidemia, unspecified: Secondary | ICD-10-CM | POA: Diagnosis not present

## 2021-02-15 DIAGNOSIS — R2689 Other abnormalities of gait and mobility: Secondary | ICD-10-CM | POA: Diagnosis not present

## 2021-02-15 DIAGNOSIS — R338 Other retention of urine: Secondary | ICD-10-CM | POA: Diagnosis not present

## 2021-02-15 DIAGNOSIS — Z978 Presence of other specified devices: Secondary | ICD-10-CM | POA: Diagnosis not present

## 2021-02-15 DIAGNOSIS — G4700 Insomnia, unspecified: Secondary | ICD-10-CM | POA: Diagnosis not present

## 2021-02-15 DIAGNOSIS — N320 Bladder-neck obstruction: Secondary | ICD-10-CM | POA: Diagnosis not present

## 2021-02-15 DIAGNOSIS — Z9181 History of falling: Secondary | ICD-10-CM | POA: Diagnosis not present

## 2021-02-15 DIAGNOSIS — E46 Unspecified protein-calorie malnutrition: Secondary | ICD-10-CM | POA: Diagnosis not present

## 2021-02-15 DIAGNOSIS — R2681 Unsteadiness on feet: Secondary | ICD-10-CM | POA: Diagnosis not present

## 2021-02-15 DIAGNOSIS — Z743 Need for continuous supervision: Secondary | ICD-10-CM | POA: Diagnosis not present

## 2021-02-15 DIAGNOSIS — F32A Depression, unspecified: Secondary | ICD-10-CM | POA: Diagnosis not present

## 2021-02-15 DIAGNOSIS — J189 Pneumonia, unspecified organism: Secondary | ICD-10-CM | POA: Diagnosis not present

## 2021-02-15 DIAGNOSIS — N189 Chronic kidney disease, unspecified: Secondary | ICD-10-CM | POA: Diagnosis not present

## 2021-02-15 DIAGNOSIS — R5381 Other malaise: Secondary | ICD-10-CM | POA: Diagnosis not present

## 2021-02-15 DIAGNOSIS — N400 Enlarged prostate without lower urinary tract symptoms: Secondary | ICD-10-CM | POA: Diagnosis not present

## 2021-02-15 DIAGNOSIS — M6281 Muscle weakness (generalized): Secondary | ICD-10-CM | POA: Diagnosis not present

## 2021-02-15 DIAGNOSIS — Z794 Long term (current) use of insulin: Secondary | ICD-10-CM | POA: Diagnosis not present

## 2021-02-15 DIAGNOSIS — R000 Tachycardia, unspecified: Secondary | ICD-10-CM | POA: Diagnosis not present

## 2021-02-15 DIAGNOSIS — R41841 Cognitive communication deficit: Secondary | ICD-10-CM | POA: Diagnosis not present

## 2021-02-15 DIAGNOSIS — R531 Weakness: Secondary | ICD-10-CM | POA: Diagnosis not present

## 2021-02-15 DIAGNOSIS — E119 Type 2 diabetes mellitus without complications: Secondary | ICD-10-CM | POA: Diagnosis not present

## 2021-02-15 DIAGNOSIS — H547 Unspecified visual loss: Secondary | ICD-10-CM | POA: Diagnosis not present

## 2021-02-15 DIAGNOSIS — E1122 Type 2 diabetes mellitus with diabetic chronic kidney disease: Secondary | ICD-10-CM | POA: Diagnosis not present

## 2021-02-15 LAB — COMPREHENSIVE METABOLIC PANEL
ALT: 27 U/L (ref 0–44)
AST: 21 U/L (ref 15–41)
Albumin: 2.5 g/dL — ABNORMAL LOW (ref 3.5–5.0)
Alkaline Phosphatase: 46 U/L (ref 38–126)
Anion gap: 6 (ref 5–15)
BUN: 23 mg/dL (ref 8–23)
CO2: 30 mmol/L (ref 22–32)
Calcium: 8.5 mg/dL — ABNORMAL LOW (ref 8.9–10.3)
Chloride: 97 mmol/L — ABNORMAL LOW (ref 98–111)
Creatinine, Ser: 1.06 mg/dL (ref 0.61–1.24)
GFR, Estimated: >60 mL/min (ref >60)
Glucose, Bld: 231 mg/dL — ABNORMAL HIGH (ref 70–99)
Potassium: 5.1 mmol/L (ref 3.5–5.1)
Sodium: 133 mmol/L — ABNORMAL LOW (ref 135–145)
Total Bilirubin: 0.6 mg/dL (ref 0.3–1.2)
Total Protein: 5.5 g/dL — ABNORMAL LOW (ref 6.5–8.1)

## 2021-02-15 LAB — CBC
HCT: 36.7 % — ABNORMAL LOW (ref 39.0–52.0)
Hemoglobin: 11.7 g/dL — ABNORMAL LOW (ref 13.0–17.0)
MCH: 29.2 pg (ref 26.0–34.0)
MCHC: 31.9 g/dL (ref 30.0–36.0)
MCV: 91.5 fL (ref 80.0–100.0)
Platelets: 259 10*3/uL (ref 150–400)
RBC: 4.01 MIL/uL — ABNORMAL LOW (ref 4.22–5.81)
RDW: 13.6 % (ref 11.5–15.5)
WBC: 6.9 10*3/uL (ref 4.0–10.5)
nRBC: 0 % (ref 0.0–0.2)

## 2021-02-15 LAB — GLUCOSE, CAPILLARY
Glucose-Capillary: 206 mg/dL — ABNORMAL HIGH (ref 70–99)
Glucose-Capillary: 169 mg/dL — ABNORMAL HIGH (ref 70–99)

## 2021-02-15 LAB — RESP PANEL BY RT-PCR (FLU A&B, COVID) ARPGX2
Influenza A by PCR: NEGATIVE
Influenza B by PCR: NEGATIVE
SARS Coronavirus 2 by RT PCR: NEGATIVE

## 2021-02-15 MED ORDER — INSULIN ASPART 100 UNIT/ML IV SOLN: INTRAVENOUS | 12 refills | Status: DC

## 2021-02-15 MED ORDER — GLIPIZIDE 5 MG PO TABS: 2.5000 mg | ORAL_TABLET | Freq: Every day | ORAL | 0 refills | Status: DC

## 2021-02-15 MED ORDER — METOPROLOL TARTRATE 25 MG PO TABS: 25.0000 mg | ORAL_TABLET | Freq: Two times a day (BID) | ORAL | 0 refills | Status: DC

## 2021-02-15 NOTE — TOC Transition Note (Signed)
Transition of Care [facility]) - CM/SW Discharge Note   Patient Details  Name: Dustin Duffy MRN: [id number] Date of Birth: 17-Sep-1955  [facility] of [facility]) CM/SW Contact:  Trish Mage, LCSW Phone Number: 02/15/2021, 9:29 AM   Clinical Narrative:   Patient who is stable for d/c will transfer to [facility] today.  Family alerted. PTAR arranged.  Nursing, please call report to [phone]. TOC sign off.    Final next level of care: Skilled Nursing Facility Barriers to Discharge: SNF Pending bed offer   Patient Goals and CMS Choice     Choice offered to / list presented to : Patient  Discharge Placement                       Discharge Plan and Services   Discharge Planning Services: CM Consult Post Acute Care Choice: [place]                               Social Determinants of Health (SDOH) Interventions     Readmission Risk Interventions No flowsheet data found.

## 2021-02-15 NOTE — Care Management Important Message (Signed)
Important Message  Patient Details IM Letter placed in Patients room. Name: Dustin Duffy MRN: [id number] Date of Birth: Feb 27, 1956   Medicare Important Message Given:  Yes     Kerin Salen 02/15/2021, 11:31 AM

## 2021-02-15 NOTE — Progress Notes (Signed)
Patient will be discharging to [facility] via [place]. Report will be called. Belongings are packed and ready to leave with patient.

## 2021-02-15 NOTE — Discharge Summary (Signed)
Physician Discharge Summary  Dustin Duffy OQH:[id number] DOB: 06-10-1955 DOA: 02/09/2021  PCP: Janie Morning, DO  Admit date: 02/09/2021 Discharge date: 02/15/2021  Admitted From: Home Disposition:  SNF  Recommendations for Outpatient Follow-up:  Follow up with PCP in 1-2 weeks Recommend referral to Endocrinology Routine foley care. Please wean foley cath as tolerated. May consider Urology consultation/referral  Discharge Condition:Improved CODE STATUS:DNR Diet recommendation: Diabetic   Brief/Interim Summary: 65 y.o. male with medical history significant of DM; BPH; HLD; HTN; and CKD presenting with weakness, falls.  He reports that he has been having problems with his R leg, had a remote injury.  It has been hurting a lot in the last few years.  He developed DM a few years ago and he has lost more of his vision.  Both of his legs have been hurting the last few days and he hasn't been able to walk.  He fell several times yesterday.  He fell again last night and it took him a couple of hours to get to the living room and then laid half on/off the sofa for the next few hours.  He managed to stand this AM and walked to the bathroom and bedroom and his legs gave out again.  He started a new DM medication Sunday and he didn't eat at all Monday.   He denies drinking ETOH   Discharge Diagnoses:  Principal Problem:   Non-traumatic rhabdomyolysis Active Problems:   BPH without urinary obstruction   Diabetes mellitus type 2 in nonobese (HCC)   Dyslipidemia   Essential hypertension   Renal impairment   Visual impairment  * Non-traumatic [place]habdomy[place] [address] with Cr 2.49, unknown baseline Cr -Likely related to recurrent falls with baseline limited physical activity, exacerbated by ACE -Renal function has normalized with IVF and cath placement -CK has trended down to 572 -Held further IVF, continue self-hydration   Renal impairment -Unclear baseline -Now normalized after  IVF and foley cath placement -UA with >500 glucose and small blood -Renal US reviewed. No acute renal abnormality with multiple small bladder diverticula   Visual impairment -Legally blind   Essential hypertension -Hold lisinopril per above -Titrated up metoprolol dose as per below, blood pressure stable -Continue metoprolol monotherapy for BP   Dyslipidemia -Initially held Zocor given elevated LFTs -LFTs have been trending down -Have resumed home statin   Diabetes mellitus type 2 in nonobese (HCC) -A1c of 12.6 suggesting very poor control -Continued on sensitive-scale SSI -Discussed with Diabetic Coordinator. Pt is legally blind -On oral hyperglycemic meds prior to admit, likely suboptimal regimen -Recommendation to f/u closely with Endocrine when discharged   BPH -Continued on flomax -Renal US with findings of bladder diverticula   Bladder outlet obstruction -over 700cc noted on recent bladder scan, now with indwelling cath -Suspect neurogenic bladder in setting of poorly controlled DM in addition to above BPH -We will discharge with Foley catheter in place at time of discharge -recommend f/u with Urology as outpatient   Sinus tachycardia -Recently noted to have heart rates into the 120s -EKG consistent with sinus tachycardia -Heart rate now much improved with metoprolol 25 mg p.o. twice daily   Discharge Instructions   Allergies as of 02/15/2021   No Known Allergies      Medication List     STOP taking these medications    lisinopril 20 MG tablet Commonly known as: ZESTRIL   Mounjaro 2.5 MG/0.5ML Pen Generic drug: tirzepatide       TAKE these medications  amitriptyline 50 MG tablet Commonly known as: ELAVIL Take 50 mg by mouth at bedtime.   aspirin EC 81 MG tablet Take 81 mg by mouth daily. Swallow whole.   cholecalciferol 25 MCG (1000 UNIT) tablet Commonly known as: VITAMIN D3 Take 1,000 Units by mouth 3 (three) times a week.    gabapentin 300 MG capsule Commonly known as: NEURONTIN Take 300 mg by mouth 3 (three) times daily.   glipiZIDE 5 MG tablet Commonly known as: GLUCOTROL Take 0.5 tablets (2.5 mg total) by mouth daily before breakfast. Start taking on: February 16, 2021   insulin aspart 100 unit/mL injection Commonly known as: novoLOG Sliding scale: CBG 70 - 120: 0 units, CBG 121 - 150: 1 unit, CBG 151 - 200: 2 units, CBG 201 - 250: 3 units, CBG 251 - 300: 5 units, CBG 301 - 350: 7 units, CBG 351 - 400: 9 units, CBG > 400: call facility MD   metoprolol tartrate 25 MG tablet Commonly known as: LOPRESSOR Take 1 tablet (25 mg total) by mouth 2 (two) times daily.   simvastatin 20 MG tablet Commonly known as: ZOCOR Take 20 mg by mouth every evening.   tamsulosin 0.4 MG Caps capsule Commonly known as: FLOMAX Take 0.4 mg by mouth 2 (two) times daily.        Contact information for after-discharge care     Destination     [facility] Preferred SNF .   Service: Skilled Nursing Contact information: [address] [place] [phone]                    No Known Allergies   Procedures/Studies: CT Head Wo Contrast  Result Date: 02/09/2021 CLINICAL DATA:  Head trauma, mod-severe EXAM: CT HEAD WITHOUT CONTRAST TECHNIQUE: Contiguous axial images were obtained from the base of the skull through the vertex without intravenous contrast. COMPARISON:  None. FINDINGS: Brain: There is no acute intracranial hemorrhage, mass effect, or edema. Gray-white differentiation is preserved. There is no extra-axial fluid collection. Prominence of ventricles and sulci reflects minor parenchymal volume loss. Vascular: There is atherosclerotic calcification at the skull base. Skull: Calvarium is unremarkable. Sinuses/Orbits: No acute finding. Other: None. IMPRESSION: No evidence of acute intracranial injury. Electronically Signed   By: Macy Mis M.D.   On:  02/09/2021 14:33   US RENAL  Result Date: 02/11/2021 CLINICAL DATA:  Acute renal failure EXAM: RENAL / URINARY TRACT ULTRASOUND COMPLETE COMPARISON:  None. FINDINGS: Right Kidney: Renal measurements: 10.2 x 5.6 x 5.5 cm. = volume: 164 mL. Echogenicity within normal limits. No mass or hydronephrosis visualized. Left Kidney: Renal measurements: 11.0 x 5.6 x 7.0 cm. = volume: 25 mL. Echogenicity within normal limits. No mass or hydronephrosis visualized. Bladder: Bladder is well distended. Multiple small bladder diverticula are noted. Other: None. IMPRESSION: No acute renal abnormality. Multiple small bladder diverticula. Electronically Signed   By: Inez Catalina M.D.   On: 02/11/2021 17:08   DG CHEST PORT 1 VIEW  Result Date: 02/11/2021 CLINICAL DATA:  Increasing shortness of breath. EXAM: PORTABLE CHEST 1 VIEW COMPARISON:  Chest radiograph 2 days ago 02/09/2021 FINDINGS: Lower lung volumes from prior exam. Stable heart size and mediastinal contours. Developing left pleural effusion with increasing streaky left lung base opacity. New bandlike opacity at the right lung base. Vascular congestion. No pneumothorax. Stable osseous structures. IMPRESSION: 1. Developing left pleural effusion with increasing streaky left lung base opacity, atelectasis versus pneumonia. 2. New bandlike opacity  at the right lung base, typical of atelectasis. Electronically Signed   By: Keith Rake M.D.   On: 02/11/2021 20:21   DG Chest Port 1 View  Result Date: 02/09/2021 CLINICAL DATA:  Shortness of breath, weakness EXAM: PORTABLE CHEST 1 VIEW COMPARISON:  None. FINDINGS: The cardiomediastinal silhouette is normal. There is asymmetric elevation of the right hemidiaphragm. Linear opacities in the left base may reflect atelectasis or scar. Otherwise, there is no focal consolidation or pulmonary edema. There is no pleural effusion or pneumothorax. There is no acute osseous abnormality. IMPRESSION: Mild left basilar atelectasis  and/or scar. Otherwise, no radiographic evidence of acute cardiopulmonary process. Electronically Signed   By: Valetta Mole M.D.   On: 02/09/2021 12:37   ECHOCARDIOGRAM COMPLETE  Result Date: 02/12/2021    ECHOCARDIOGRAM REPORT   Patient Name:   LESHON ARMISTEAD Date of Exam: 02/12/2021 Medical Rec #:  [id number]   Height:       68.5 in Accession #:    [id number]  Weight:       156.0 lb Date of Birth:  [birth year]   BSA:          1.849 m Patient Age:    65 years    BP:           108/76 mmHg Patient Gender: M           HR:           105 bpm. Exam Location:  Inpatient Procedure: 2D Echo, Cardiac Doppler and Color Doppler Indications:    CHF  History:        Patient has no prior history of Echocardiogram examinations.                 Risk Factors:Diabetes and Hypertension.  Sonographer:    Glo Herring Referring Phys: [place]  1. Left ventricular ejection fraction, by estimation, is 65 to 70%. The left ventricle has normal function. The left ventricle has no regional wall motion abnormalities. Left ventricular diastolic parameters are consistent with Grade I diastolic dysfunction (impaired relaxation).  2. Right ventricular systolic function is normal. The right ventricular size is normal. There is normal pulmonary artery systolic pressure.  3. The mitral valve is normal in structure. No evidence of mitral valve regurgitation. No evidence of mitral stenosis.  4. The aortic valve is tricuspid. There is mild calcification of the aortic valve. There is mild thickening of the aortic valve. Aortic valve regurgitation is not visualized. Aortic valve sclerosis is present, with no evidence of aortic valve stenosis. Aortic valve mean gradient measures 5.0 mmHg. Aortic valve Vmax measures 1.38 m/s.  5. The inferior vena cava is dilated in size with >50% respiratory variability, suggesting right atrial pressure of 8 mmHg. FINDINGS  Left Ventricle: Left ventricular ejection fraction, by estimation, is 65  to 70%. The left ventricle has normal function. The left ventricle has no regional wall motion abnormalities. The left ventricular internal cavity size was normal in size. There is  no left ventricular hypertrophy. Left ventricular diastolic parameters are consistent with Grade I diastolic dysfunction (impaired relaxation). Indeterminate filling pressures. Right Ventricle: The right ventricular size is normal. No increase in right ventricular wall thickness. Right ventricular systolic function is normal. There is normal pulmonary artery systolic pressure. The tricuspid regurgitant velocity is 1.85 m/s, and  with an assumed right atrial pressure of 8 mmHg, the estimated right ventricular systolic pressure is 08.6 mmHg. Left Atrium: Left atrial size was normal in  size. Right Atrium: Right atrial size was normal in size. Pericardium: There is no evidence of pericardial effusion. Mitral Valve: The mitral valve is normal in structure. No evidence of mitral valve regurgitation. No evidence of mitral valve stenosis. Tricuspid Valve: The tricuspid valve is normal in structure. Tricuspid valve regurgitation is trivial. No evidence of tricuspid stenosis. Aortic Valve: The aortic valve is tricuspid. There is mild calcification of the aortic valve. There is mild thickening of the aortic valve. Aortic valve regurgitation is not visualized. Aortic valve sclerosis is present, with no evidence of aortic valve stenosis. Aortic valve mean gradient measures 5.0 mmHg. Aortic valve peak gradient measures 7.6 mmHg. Aortic valve area, by VTI measures 3.16 cm. Pulmonic Valve: The pulmonic valve was normal in structure. Pulmonic valve regurgitation is not visualized. No evidence of pulmonic stenosis. Aorta: The aortic root is normal in size and structure. Venous: The inferior vena cava is dilated in size with greater than 50% respiratory variability, suggesting right atrial pressure of 8 mmHg. IAS/Shunts: No atrial level shunt detected by  color flow Doppler.  LEFT VENTRICLE PLAX 2D LVIDd:         3.50 cm   Diastology LVIDs:         2.20 cm   LV e' medial:    6.42 cm/s LV PW:         1.00 cm   LV E/e' medial:  11.7 LV IVS:        1.00 cm   LV e' lateral:   7.62 cm/s LVOT diam:     2.10 cm   LV E/e' lateral: 9.8 LV SV:         77 LV SV Index:   42 LVOT Area:     3.46 cm  RIGHT VENTRICLE             IVC RV Basal diam:  3.30 cm     IVC diam: 2.10 cm RV S prime:     17.00 cm/s LEFT ATRIUM             Index        RIGHT ATRIUM           Index LA diam:        4.30 cm 2.33 cm/m   RA Area:     13.10 cm LA Vol (A2C):   25.1 ml 13.58 ml/m  RA Volume:   27.40 ml  14.82 ml/m LA Vol (A4C):   26.2 ml 14.17 ml/m LA Biplane Vol: 28.4 ml 15.36 ml/m  AORTIC VALVE                     PULMONIC VALVE AV Area (Vmax):    3.14 cm      PV Vmax:       0.99 m/s AV Area (Vmean):   2.99 cm      PV Peak grad:  3.9 mmHg AV Area (VTI):     3.16 cm AV Vmax:           138.00 cm/s AV Vmean:          108.000 cm/s AV VTI:            0.243 m AV Peak Grad:      7.6 mmHg AV Mean Grad:      5.0 mmHg LVOT Vmax:         125.00 cm/s LVOT Vmean:        93.100 cm/s LVOT VTI:  0.222 m LVOT/AV VTI ratio: 0.91  AORTA Ao Root diam: 3.40 cm Ao Asc diam:  2.80 cm MITRAL VALVE               TRICUSPID VALVE MV Area (PHT): 5.31 cm    TR Peak grad:   13.7 mmHg MV Decel Time: 143 msec    TR Vmax:        185.00 cm/s MV E velocity: 74.90 cm/s MV A velocity: 91.30 cm/s  SHUNTS MV E/A ratio:  0.82        Systemic VTI:  0.22 m                            Systemic Diam: 2.10 cm Skeet Latch MD Electronically signed by Skeet Latch MD Signature Date/Time: 02/12/2021/5:18:59 PM    Final     Subjective: Eager to go to rehab today  Discharge Exam: Vitals:   02/14/21 2009 02/15/21 0503  BP: 126/76 120/71  Pulse: 95 93  Resp: (!) 24 16  Temp: 97.9 F (36.6 C) 98 F (36.7 C)  SpO2: 96% 94%   Vitals:   02/14/21 0812 02/14/21 1339 02/14/21 2009 02/15/21 0503  BP: 119/75 108/74  126/76 120/71  Pulse: (!) 106 96 95 93  Resp:  19 (!) 24 16  Temp:  97.6 F (36.4 C) 97.9 F (36.6 C) 98 F (36.7 C)  TempSrc:   Oral Oral  SpO2:  95% 96% 94%  Weight:      Height:        General: Pt is alert, awake, not in acute distress Cardiovascular: RRR, S1/S2 + Respiratory: CTA bilaterally, no wheezing, no rhonchi Abdominal: Soft, NT, ND, bowel sounds + Extremities: no edema, no cyanosis   The results of significant diagnostics from this hospitalization (including imaging, microbiology, ancillary and laboratory) are listed below for reference.     Microbiology: Recent Results (from the past 240 hour(s))  Resp Panel by RT-PCR (Flu A&B, Covid) Nasopharyngeal Swab     Status: None   Collection Time: 02/09/21 11:32 AM   Specimen: Nasopharyngeal Swab; Nasopharyngeal(NP) swabs in vial transport medium  Result Value Ref Range Status   SARS Coronavirus 2 by RT PCR NEGATIVE NEGATIVE Final    Comment: (NOTE) SARS-CoV-2 target nucleic acids are NOT DETECTED.  The SARS-CoV-2 RNA is generally detectable in upper respiratory specimens during the acute phase of infection. The lowest concentration of SARS-CoV-2 viral copies this assay can detect is 138 copies/mL. A negative result does not preclude SARS-Cov-2 infection and should not be used as the sole basis for treatment or other patient management decisions. A negative result may occur with  improper specimen collection/handling, submission of specimen other than nasopharyngeal swab, presence of viral mutation(s) within the areas targeted by this assay, and inadequate number of viral copies(<138 copies/mL). A negative result must be combined with clinical observations, patient history, and epidemiological information. The expected result is Negative.  Fact Sheet for Patients:  EntrepreneurPulse.com.au  Fact Sheet for Healthcare Providers:  IncredibleEmployment.be  This test is no t yet  approved or cleared by the [place] FDA and  has been authorized for detection and/or diagnosis of SARS-CoV-2 by FDA under an Emergency Use Authorization (EUA). This EUA will remain  in effect (meaning this test can be used) for the duration of the COVID-19 declaration under Section 564(b)(1) of the Act, 21 U.S.C.section 360bbb-3(b)(1), unless the authorization is terminated  or revoked sooner.  Influenza A by PCR NEGATIVE NEGATIVE Final   Influenza B by PCR NEGATIVE NEGATIVE Final    Comment: (NOTE) The Xpert Xpress SARS-CoV-2/FLU/RSV plus assay is intended as an aid in the diagnosis of influenza from Nasopharyngeal swab specimens and should not be used as a sole basis for treatment. Nasal washings and aspirates are unacceptable for Xpert Xpress SARS-CoV-2/FLU/RSV testing.  Fact Sheet for Patients: EntrepreneurPulse.com.au  Fact Sheet for Healthcare Providers: IncredibleEmployment.be  This test is not yet approved or cleared by the [place] FDA and has been authorized for detection and/or diagnosis of SARS-CoV-2 by FDA under an Emergency Use Authorization (EUA). This EUA will remain in effect (meaning this test can be used) for the duration of the COVID-19 declaration under Section 564(b)(1) of the Act, 21 U.S.C. section 360bbb-3(b)(1), unless the authorization is terminated or revoked.  Performed at [facility], [place] [address] [place], [place] [address]   Resp Panel by RT-PCR (Flu A&B, Covid) Nasopharyngeal Swab     Status: None   Collection Time: 02/15/21 10:53 AM   Specimen: Nasopharyngeal Swab; Nasopharyngeal(NP) swabs in vial transport medium  Result Value Ref Range Status   SARS Coronavirus 2 by RT PCR NEGATIVE NEGATIVE Final    Comment: (NOTE) SARS-CoV-2 target nucleic acids are NOT DETECTED.  The SARS-CoV-2 RNA is generally detectable in upper respiratory specimens during the acute phase of  infection. The lowest concentration of SARS-CoV-2 viral copies this assay can detect is 138 copies/mL. A negative result does not preclude SARS-Cov-2 infection and should not be used as the sole basis for treatment or other patient management decisions. A negative result may occur with  improper specimen collection/handling, submission of specimen other than nasopharyngeal swab, presence of viral mutation(s) within the areas targeted by this assay, and inadequate number of viral copies(<138 copies/mL). A negative result must be combined with clinical observations, patient history, and epidemiological information. The expected result is Negative.  Fact Sheet for Patients:  EntrepreneurPulse.com.au  Fact Sheet for Healthcare Providers:  IncredibleEmployment.be  This test is no t yet approved or cleared by the [place] FDA and  has been authorized for detection and/or diagnosis of SARS-CoV-2 by FDA under an Emergency Use Authorization (EUA). This EUA will remain  in effect (meaning this test can be used) for the duration of the COVID-19 declaration under Section 564(b)(1) of the Act, 21 U.S.C.section 360bbb-3(b)(1), unless the authorization is terminated  or revoked sooner.       Influenza A by PCR NEGATIVE NEGATIVE Final   Influenza B by PCR NEGATIVE NEGATIVE Final    Comment: (NOTE) The Xpert Xpress SARS-CoV-2/FLU/RSV plus assay is intended as an aid in the diagnosis of influenza from Nasopharyngeal swab specimens and should not be used as a sole basis for treatment. Nasal washings and aspirates are unacceptable for Xpert Xpress SARS-CoV-2/FLU/RSV testing.  Fact Sheet for Patients: EntrepreneurPulse.com.au  Fact Sheet for Healthcare Providers: IncredibleEmployment.be  This test is not yet approved or cleared by the [place] FDA and has been authorized for detection and/or diagnosis of SARS-CoV-2  by FDA under an Emergency Use Authorization (EUA). This EUA will remain in effect (meaning this test can be used) for the duration of the COVID-19 declaration under Section 564(b)(1) of the Act, 21 U.S.C. section 360bbb-3(b)(1), unless the authorization is terminated or revoked.  Performed at [facility], [place] [address], [place], [place] [address]      Labs: BNP (last 3 results) No results for input(s): BNP in  the last 8760 hours. Basic Metabolic Panel: Recent Labs  Lab 02/10/21 0446 02/10/21 2010 02/11/21 0501 02/12/21 0454 02/13/21 0507 02/14/21 0528 02/15/21 0500  NA 137 136 135 137 135 134* 133*  K 4.8 5.7* 5.1 5.7* 4.0 4.0 5.1  CL 108 105 108 104 103 101 97*  CO2 22 23 20* 23 26 28 30   GLUCOSE 226* 267* 205* 263* 225* 239* 231*  BUN 54* 44* 39* 39* 27* 25* 23  CREATININE 1.88* 1.98* 1.83* 1.69* 1.03 0.95 1.06  CALCIUM 8.8* 8.8* 8.4* 8.6* 8.2* 8.4* 8.5*  MG 2.0 2.0  --   --   --   --   --   PHOS 3.5 3.2  --   --   --   --   --    Liver Function Tests: Recent Labs  Lab 02/11/21 0501 02/12/21 0454 02/13/21 0507 02/14/21 0528 02/15/21 0500  AST 52* 27 17 13* 21  ALT 42 36 27 23 27   ALKPHOS 47 47 40 35* 46  BILITOT 1.5* 1.1 1.0 0.8 0.6  PROT 5.1* 5.6* 4.9* 4.6* 5.5*  ALBUMIN 2.6* 2.6* 2.2* 2.0* 2.5*   No results for input(s): LIPASE, AMYLASE in the last 168 hours. No results for input(s): AMMONIA in the last 168 hours. CBC: Recent Labs  Lab 02/09/21 1132 02/10/21 0446 02/11/21 0709 02/12/21 0454 02/13/21 0507 02/15/21 0500  WBC 8.0 7.8 7.5 8.6 6.5 6.9  NEUTROABS 6.0 5.5 5.4 6.3  --   --   HGB 14.6 12.7* 12.3* 12.1* 10.5* 11.7*  HCT 44.9 38.2* 37.2* 38.0* 32.4* 36.7*  MCV 90.0 89.0 90.1 92.9 91.3 91.5  PLT 228 211 191 211 190 259   Cardiac Enzymes: Recent Labs  Lab 02/09/21 1132 02/11/21 0501 02/12/21 0454  CKTOTAL 19,190* 2,006* 572*   BNP: Invalid input(s): POCBNP CBG: Recent Labs  Lab 02/14/21 1129 02/14/21 1600  02/14/21 2011 02/15/21 0731 02/15/21 1139  GLUCAP 247* 244* 192* 206* 169*   D-Dimer No results for input(s): DDIMER in the last 72 hours. Hgb A1c No results for input(s): HGBA1C in the last 72 hours. Lipid Profile No results for input(s): CHOL, HDL, LDLCALC, TRIG, CHOLHDL, LDLDIRECT in the last 72 hours. Thyroid function studies No results for input(s): TSH, T4TOTAL, T3FREE, THYROIDAB in the last 72 hours.  Invalid input(s): FREET3 Anemia work up No results for input(s): VITAMINB12, FOLATE, FERRITIN, TIBC, IRON, RETICCTPCT in the last 72 hours. Urinalysis    Component Value Date/Time   COLORURINE YELLOW 02/11/2021 1430   APPEARANCEUR CLEAR 02/11/2021 1430   LABSPEC 1.008 02/11/2021 1430   PHURINE 5.0 02/11/2021 1430   GLUCOSEU >=500 (A) 02/11/2021 1430   HGBUR SMALL (A) 02/11/2021 1430   BILIRUBINUR NEGATIVE 02/11/2021 1430   KETONESUR NEGATIVE 02/11/2021 1430   PROTEINUR NEGATIVE 02/11/2021 1430   NITRITE NEGATIVE 02/11/2021 1430   LEUKOCYTESUR NEGATIVE 02/11/2021 1430   Sepsis Labs Invalid input(s): PROCALCITONIN,  WBC,  LACTICIDVEN Microbiology Recent Results (from the past 240 hour(s))  Resp Panel by RT-PCR (Flu A&B, Covid) Nasopharyngeal Swab     Status: None   Collection Time: 02/09/21 11:32 AM   Specimen: Nasopharyngeal Swab; Nasopharyngeal(NP) swabs in vial transport medium  Result Value Ref Range Status   SARS Coronavirus 2 by RT PCR NEGATIVE NEGATIVE Final    Comment: (NOTE) SARS-CoV-2 target nucleic acids are NOT DETECTED.  The SARS-CoV-2 RNA is generally detectable in upper respiratory specimens during the acute phase of infection. The lowest concentration of SARS-CoV-2 viral copies this assay can  detect is 138 copies/mL. A negative result does not preclude SARS-Cov-2 infection and should not be used as the sole basis for treatment or other patient management decisions. A negative result may occur with  improper specimen collection/handling, submission  of specimen other than nasopharyngeal swab, presence of viral mutation(s) within the areas targeted by this assay, and inadequate number of viral copies(<138 copies/mL). A negative result must be combined with clinical observations, patient history, and epidemiological information. The expected result is Negative.  Fact Sheet for Patients:  EntrepreneurPulse.com.au  Fact Sheet for Healthcare Providers:  IncredibleEmployment.be  This test is no t yet approved or cleared by the [place] FDA and  has been authorized for detection and/or diagnosis of SARS-CoV-2 by FDA under an Emergency Use Authorization (EUA). This EUA will remain  in effect (meaning this test can be used) for the duration of the COVID-19 declaration under Section 564(b)(1) of the Act, 21 U.S.C.section 360bbb-3(b)(1), unless the authorization is terminated  or revoked sooner.       Influenza A by PCR NEGATIVE NEGATIVE Final   Influenza B by PCR NEGATIVE NEGATIVE Final    Comment: (NOTE) The Xpert Xpress SARS-CoV-2/FLU/RSV plus assay is intended as an aid in the diagnosis of influenza from Nasopharyngeal swab specimens and should not be used as a sole basis for treatment. Nasal washings and aspirates are unacceptable for Xpert Xpress SARS-CoV-2/FLU/RSV testing.  Fact Sheet for Patients: EntrepreneurPulse.com.au  Fact Sheet for Healthcare Providers: IncredibleEmployment.be  This test is not yet approved or cleared by the [place] FDA and has been authorized for detection and/or diagnosis of SARS-CoV-2 by FDA under an Emergency Use Authorization (EUA). This EUA will remain in effect (meaning this test can be used) for the duration of the COVID-19 declaration under Section 564(b)(1) of the Act, 21 U.S.C. section 360bbb-3(b)(1), unless the authorization is terminated or revoked.  Performed at [facility], [place]  [address], [place], [place] [address]   Resp Panel by RT-PCR (Flu A&B, Covid) Nasopharyngeal Swab     Status: None   Collection Time: 02/15/21 10:53 AM   Specimen: Nasopharyngeal Swab; Nasopharyngeal(NP) swabs in vial transport medium  Result Value Ref Range Status   SARS Coronavirus 2 by RT PCR NEGATIVE NEGATIVE Final    Comment: (NOTE) SARS-CoV-2 target nucleic acids are NOT DETECTED.  The SARS-CoV-2 RNA is generally detectable in upper respiratory specimens during the acute phase of infection. The lowest concentration of SARS-CoV-2 viral copies this assay can detect is 138 copies/mL. A negative result does not preclude SARS-Cov-2 infection and should not be used as the sole basis for treatment or other patient management decisions. A negative result may occur with  improper specimen collection/handling, submission of specimen other than nasopharyngeal swab, presence of viral mutation(s) within the areas targeted by this assay, and inadequate number of viral copies(<138 copies/mL). A negative result must be combined with clinical observations, patient history, and epidemiological information. The expected result is Negative.  Fact Sheet for Patients:  EntrepreneurPulse.com.au  Fact Sheet for Healthcare Providers:  IncredibleEmployment.be  This test is no t yet approved or cleared by the [place] FDA and  has been authorized for detection and/or diagnosis of SARS-CoV-2 by FDA under an Emergency Use Authorization (EUA). This EUA will remain  in effect (meaning this test can be used) for the duration of the COVID-19 declaration under Section 564(b)(1) of the Act, 21 U.S.C.section 360bbb-3(b)(1), unless the authorization is terminated  or revoked sooner.  Influenza A by PCR NEGATIVE NEGATIVE Final   Influenza B by PCR NEGATIVE NEGATIVE Final    Comment: (NOTE) The Xpert Xpress SARS-CoV-2/FLU/RSV plus assay is intended as an aid in  the diagnosis of influenza from Nasopharyngeal swab specimens and should not be used as a sole basis for treatment. Nasal washings and aspirates are unacceptable for Xpert Xpress SARS-CoV-2/FLU/RSV testing.  Fact Sheet for Patients: EntrepreneurPulse.com.au  Fact Sheet for Healthcare Providers: IncredibleEmployment.be  This test is not yet approved or cleared by the [place] FDA and has been authorized for detection and/or diagnosis of SARS-CoV-2 by FDA under an Emergency Use Authorization (EUA). This EUA will remain in effect (meaning this test can be used) for the duration of the COVID-19 declaration under Section 564(b)(1) of the Act, 21 U.S.C. section 360bbb-3(b)(1), unless the authorization is terminated or revoked.  Performed at [facility], [place] [address] [place], [place] [address]    Time spent: 30 min  SIGNED:   Marylu Lund, MD  [facility] 02/15/2021, 12:17 PM  If 7PM-7AM, please contact night-coverage

## 2021-02-16 DIAGNOSIS — E1122 Type 2 diabetes mellitus with diabetic chronic kidney disease: Secondary | ICD-10-CM | POA: Diagnosis not present

## 2021-02-16 DIAGNOSIS — R5381 Other malaise: Secondary | ICD-10-CM | POA: Diagnosis not present

## 2021-02-16 DIAGNOSIS — M6282 Rhabdomyolysis: Secondary | ICD-10-CM | POA: Diagnosis not present

## 2021-02-16 DIAGNOSIS — Z794 Long term (current) use of insulin: Secondary | ICD-10-CM | POA: Diagnosis not present

## 2021-02-25 DIAGNOSIS — M6282 Rhabdomyolysis: Secondary | ICD-10-CM | POA: Diagnosis not present

## 2021-02-25 DIAGNOSIS — R41841 Cognitive communication deficit: Secondary | ICD-10-CM | POA: Diagnosis not present

## 2021-02-25 DIAGNOSIS — E46 Unspecified protein-calorie malnutrition: Secondary | ICD-10-CM | POA: Diagnosis not present

## 2021-02-25 DIAGNOSIS — G4700 Insomnia, unspecified: Secondary | ICD-10-CM | POA: Diagnosis not present

## 2021-02-25 DIAGNOSIS — Z794 Long term (current) use of insulin: Secondary | ICD-10-CM | POA: Diagnosis not present

## 2021-02-25 DIAGNOSIS — N400 Enlarged prostate without lower urinary tract symptoms: Secondary | ICD-10-CM | POA: Diagnosis not present

## 2021-02-25 DIAGNOSIS — E785 Hyperlipidemia, unspecified: Secondary | ICD-10-CM | POA: Diagnosis not present

## 2021-02-25 DIAGNOSIS — R000 Tachycardia, unspecified: Secondary | ICD-10-CM | POA: Diagnosis not present

## 2021-02-25 DIAGNOSIS — F32A Depression, unspecified: Secondary | ICD-10-CM | POA: Diagnosis not present

## 2021-02-25 DIAGNOSIS — N320 Bladder-neck obstruction: Secondary | ICD-10-CM | POA: Diagnosis not present

## 2021-02-25 DIAGNOSIS — E1122 Type 2 diabetes mellitus with diabetic chronic kidney disease: Secondary | ICD-10-CM | POA: Diagnosis not present

## 2021-02-25 DIAGNOSIS — N189 Chronic kidney disease, unspecified: Secondary | ICD-10-CM | POA: Diagnosis not present

## 2021-02-25 DIAGNOSIS — I129 Hypertensive chronic kidney disease with stage 1 through stage 4 chronic kidney disease, or unspecified chronic kidney disease: Secondary | ICD-10-CM | POA: Diagnosis not present

## 2021-02-25 DIAGNOSIS — R2689 Other abnormalities of gait and mobility: Secondary | ICD-10-CM | POA: Diagnosis not present

## 2021-02-25 DIAGNOSIS — H547 Unspecified visual loss: Secondary | ICD-10-CM | POA: Diagnosis not present

## 2021-02-25 DIAGNOSIS — Z9181 History of falling: Secondary | ICD-10-CM | POA: Diagnosis not present

## 2021-02-25 DIAGNOSIS — E119 Type 2 diabetes mellitus without complications: Secondary | ICD-10-CM | POA: Diagnosis not present

## 2021-02-25 DIAGNOSIS — R2681 Unsteadiness on feet: Secondary | ICD-10-CM | POA: Diagnosis not present

## 2021-02-25 DIAGNOSIS — M6281 Muscle weakness (generalized): Secondary | ICD-10-CM | POA: Diagnosis not present

## 2021-02-25 DIAGNOSIS — R338 Other retention of urine: Secondary | ICD-10-CM | POA: Diagnosis not present

## 2021-02-26 DIAGNOSIS — Z978 Presence of other specified devices: Secondary | ICD-10-CM | POA: Diagnosis not present

## 2021-02-26 DIAGNOSIS — M6282 Rhabdomyolysis: Secondary | ICD-10-CM | POA: Diagnosis not present

## 2021-02-26 DIAGNOSIS — R5381 Other malaise: Secondary | ICD-10-CM | POA: Diagnosis not present

## 2021-02-26 DIAGNOSIS — N320 Bladder-neck obstruction: Secondary | ICD-10-CM | POA: Diagnosis not present

## 2021-03-04 DIAGNOSIS — R338 Other retention of urine: Secondary | ICD-10-CM | POA: Diagnosis not present

## 2021-03-09 ENCOUNTER — Other Ambulatory Visit: Payer: Self-pay | Admitting: *Deleted

## 2021-03-09 NOTE — Patient Outreach (Signed)
Member screened for potential [facility] Care Management needs. Mr. Brandenburg resides in [facility].   Communication sent to [facility] SW to inquire about transition plans and potential [facility] Care Management needs.   Will continue to follow.    Marthenia Rolling, MSN, RN,BSN [place] Acute Care Coordinator [phone] [facility]) [phone]  (Toll free office)

## 2021-03-10 ENCOUNTER — Other Ambulatory Visit: Payer: Self-pay | Admitting: *Deleted

## 2021-03-10 NOTE — Patient Outreach (Signed)
[facility]- Acute Care Coordinator follow up. Mr. Boike resides in [facility]. Member screened for potential [facility] Care Management needs.   Update received from [place] [place] [facility] SW indicating Mr. Lepak lives alone. He has a visual impairment and requires insulin. SNF SW working on transition plans.  Writer will continue to follow for [place] Management services and plan outreach accordingly.   Marthenia Rolling, MSN, RN,BSN [place] Acute Care Coordinator [phone] [facility]) [phone]  (Toll free office)

## 2021-03-16 DIAGNOSIS — Z9181 History of falling: Secondary | ICD-10-CM | POA: Diagnosis not present

## 2021-03-16 DIAGNOSIS — M6282 Rhabdomyolysis: Secondary | ICD-10-CM | POA: Diagnosis not present

## 2021-03-16 DIAGNOSIS — E119 Type 2 diabetes mellitus without complications: Secondary | ICD-10-CM | POA: Diagnosis not present

## 2021-03-16 DIAGNOSIS — R2681 Unsteadiness on feet: Secondary | ICD-10-CM | POA: Diagnosis not present

## 2021-03-16 DIAGNOSIS — M6281 Muscle weakness (generalized): Secondary | ICD-10-CM | POA: Diagnosis not present

## 2021-03-16 DIAGNOSIS — R2689 Other abnormalities of gait and mobility: Secondary | ICD-10-CM | POA: Diagnosis not present

## 2021-03-16 DIAGNOSIS — I129 Hypertensive chronic kidney disease with stage 1 through stage 4 chronic kidney disease, or unspecified chronic kidney disease: Secondary | ICD-10-CM | POA: Diagnosis not present

## 2021-03-18 DIAGNOSIS — R2681 Unsteadiness on feet: Secondary | ICD-10-CM | POA: Diagnosis not present

## 2021-03-18 DIAGNOSIS — E119 Type 2 diabetes mellitus without complications: Secondary | ICD-10-CM | POA: Diagnosis not present

## 2021-03-18 DIAGNOSIS — Z9181 History of falling: Secondary | ICD-10-CM | POA: Diagnosis not present

## 2021-03-18 DIAGNOSIS — I129 Hypertensive chronic kidney disease with stage 1 through stage 4 chronic kidney disease, or unspecified chronic kidney disease: Secondary | ICD-10-CM | POA: Diagnosis not present

## 2021-03-18 DIAGNOSIS — R2689 Other abnormalities of gait and mobility: Secondary | ICD-10-CM | POA: Diagnosis not present

## 2021-03-18 DIAGNOSIS — M6282 Rhabdomyolysis: Secondary | ICD-10-CM | POA: Diagnosis not present

## 2021-03-18 DIAGNOSIS — M6281 Muscle weakness (generalized): Secondary | ICD-10-CM | POA: Diagnosis not present

## 2021-03-23 DIAGNOSIS — R2689 Other abnormalities of gait and mobility: Secondary | ICD-10-CM | POA: Diagnosis not present

## 2021-03-23 DIAGNOSIS — E119 Type 2 diabetes mellitus without complications: Secondary | ICD-10-CM | POA: Diagnosis not present

## 2021-03-23 DIAGNOSIS — I129 Hypertensive chronic kidney disease with stage 1 through stage 4 chronic kidney disease, or unspecified chronic kidney disease: Secondary | ICD-10-CM | POA: Diagnosis not present

## 2021-03-23 DIAGNOSIS — M6281 Muscle weakness (generalized): Secondary | ICD-10-CM | POA: Diagnosis not present

## 2021-03-23 DIAGNOSIS — M6282 Rhabdomyolysis: Secondary | ICD-10-CM | POA: Diagnosis not present

## 2021-03-23 DIAGNOSIS — Z9181 History of falling: Secondary | ICD-10-CM | POA: Diagnosis not present

## 2021-03-23 DIAGNOSIS — R2681 Unsteadiness on feet: Secondary | ICD-10-CM | POA: Diagnosis not present

## 2021-03-24 ENCOUNTER — Other Ambulatory Visit: Payer: Self-pay | Admitting: *Deleted

## 2021-03-24 DIAGNOSIS — E785 Hyperlipidemia, unspecified: Secondary | ICD-10-CM | POA: Diagnosis not present

## 2021-03-24 DIAGNOSIS — Z794 Long term (current) use of insulin: Secondary | ICD-10-CM | POA: Diagnosis not present

## 2021-03-24 DIAGNOSIS — E46 Unspecified protein-calorie malnutrition: Secondary | ICD-10-CM | POA: Diagnosis not present

## 2021-03-24 DIAGNOSIS — E1122 Type 2 diabetes mellitus with diabetic chronic kidney disease: Secondary | ICD-10-CM | POA: Diagnosis not present

## 2021-03-24 DIAGNOSIS — R338 Other retention of urine: Secondary | ICD-10-CM | POA: Diagnosis not present

## 2021-03-24 NOTE — Patient Outreach (Signed)
[place] Coordinator follow up. Mr. Lamping resides in [place] per [employer]. Screened for potential [facility] Care Management needs.   [place] SN[facility] SW Marita [place] previously indicated Mr. Arocho has visual impairment and requires insulin. He lives alone. His sister lives nearby.   Telephone call made to Mr. Grealish [phone] to discuss his transition plans and [facility] Care Management services. No answer. Phone rang and rang without voicemail connection.   Will plan outreach at later time to discuss [place] Management services.   Marthenia Rolling, MSN, RN,BSN [place] Acute Care Coordinator [phone] [facility]) [phone]  (Toll free office)

## 2021-03-25 ENCOUNTER — Other Ambulatory Visit: Payer: Self-pay | Admitting: *Deleted

## 2021-03-25 DIAGNOSIS — E119 Type 2 diabetes mellitus without complications: Secondary | ICD-10-CM | POA: Diagnosis not present

## 2021-03-25 DIAGNOSIS — R2689 Other abnormalities of gait and mobility: Secondary | ICD-10-CM | POA: Diagnosis not present

## 2021-03-25 DIAGNOSIS — R2681 Unsteadiness on feet: Secondary | ICD-10-CM | POA: Diagnosis not present

## 2021-03-25 DIAGNOSIS — I129 Hypertensive chronic kidney disease with stage 1 through stage 4 chronic kidney disease, or unspecified chronic kidney disease: Secondary | ICD-10-CM | POA: Diagnosis not present

## 2021-03-25 DIAGNOSIS — M6281 Muscle weakness (generalized): Secondary | ICD-10-CM | POA: Diagnosis not present

## 2021-03-25 DIAGNOSIS — Z9181 History of falling: Secondary | ICD-10-CM | POA: Diagnosis not present

## 2021-03-25 DIAGNOSIS — M6282 Rhabdomyolysis: Secondary | ICD-10-CM | POA: Diagnosis not present

## 2021-03-25 NOTE — Patient Outreach (Signed)
[place] Coordinator follow up. Mr. Trethewey resides in [facility] Writer following for potential [facility] Care Management services.   Update received from [place] [place] [facility] SW indicating Mr. Azer will transition home on 03/26/21. Marita [place] reports Mr. Sandoval is now on oral diabetic medication. He is no longer on insulin.   Attempted to reach Mr. Evetts again on listed mobile phone number in chart [phone]. No answer. Telephone call made to Mr. Akhavan sister Jettie Lazare [phone] to confirm member's number. Caren Griffins reports the telephone number writer is calling is Mr. Guerrini home number and that he will return home tomorrow. States Mr. Denner only turns his cell phone on when he is going to use it. Advises Probation officer to contact him on the facility line.   Attempted to reach Mr. Fuston on facility room telephone number. Again no answer. Will attempt outreach again tomorrow. Will follow up with facility SW to find out what home health agency was arranged.   Marthenia Rolling, MSN, RN,BSN [place] Acute Care Coordinator [phone] [facility]) [phone]  (Toll free office)

## 2021-03-25 NOTE — Patient Outreach (Signed)
[place] Coordinator follow up. Mr. Ferrante remains in [facility].   Second attempt made to reach Mr. Pugh at [phone] to discuss [facility] services. No answer. Phone with out voicemail connection. Notification previously sent to [facility] SW to make aware of writer's attempt to reach Mr. Chalker.   Will plan outreach again to speak with Mr. Embleton about potential [facility] Care Management services and transition plans.    Marthenia Rolling, MSN, RN,BSN [place] Acute Care Coordinator [phone] [facility]) [phone]  (Toll free office)

## 2021-03-26 ENCOUNTER — Other Ambulatory Visit: Payer: Self-pay | Admitting: *Deleted

## 2021-03-26 DIAGNOSIS — I10 Essential (primary) hypertension: Secondary | ICD-10-CM

## 2021-03-26 NOTE — Patient Outreach (Signed)
[place] [facility]) Care Management  03/26/2021  Criag Wicklund [birth year] [id number]   Hospital referral from [facility] for complex case management. Assigned to Raina Mina, RN Care Coordinator for follow up.   Thank you, [place] Care Management Assistant

## 2021-03-26 NOTE — Patient Outreach (Addendum)
[place] Coordinator follow up. Mr. Ceesay transitioned to home today 03/26/21 from [facility]. [place] SN[facility] SW Marita [place] reports member will have [facility].   Telephone call made to Mr. Felter [phone]. Patient identifiers confirmed. Mr. Goracke asked that writer contact [place] to find out the status of his prescriptions being [facility] so his sister can go pick them up.  Writer contacted [employer]. Spoke with Lenward Chancellor who reports nurse Caryl Pina is responsible for having prescriptions sent to Mr. [facility] pharmacy. Writer requested that prescriptions be sent sooner than later as Mr. Loux wants to be sure he has them before evening doses. Lenward Chancellor states she will pass the message along. Writer provided contact information.   Telephone made again to Mr. Dipasquale to discuss the above notes. Discussed [facility] Care Management services. Mr. Sevey is agreeable. States his sister Vin Yonke is his HCPOA and usually handles his appointments and medications. States Caren Griffins has his list of medications.  States Caren Griffins should be contacted regarding the medications he is on. Mr. Hiller endorses that he is not on insulin but 2 oral diabetic medications.   Mr. Beitler also reports Caren Griffins takes him to medical appointments but sometimes he has to cancel if Caren Griffins is not available. Mr. Dangerfield is interested in referral for transportation assistance since transportation can be an issue at times. Mr. Siever states Caren Griffins knows when his appointments are.   Mr. Alleva states he has a retired Marine scientist friend who takes him grocery shopping. States he plans on changing his eating habits. He does not have any concerns with food insecurity.   Explained [facility] Care Management will not interfere or replace services provided by home health. Mr. Spiering states Fabio Wah (sister) is his HCPOA and gives permission for Caren Griffins to be contacted regarding his care. Will pass this information along in [facility] CM  referral as well.   Will make referral to [place] for complex case management. Will also make referral to [facility] for transportation assistance.   Mr. Riviello reports his home number is best way to reach him at [phone].  Raphael Fitzpatrick (sister/HCPOA) [phone].   Marthenia Rolling, MSN, RN,BSN [place] Acute Care Coordinator [phone] [facility]) [phone]  (Toll free office)

## 2021-03-27 DIAGNOSIS — E1140 Type 2 diabetes mellitus with diabetic neuropathy, unspecified: Secondary | ICD-10-CM | POA: Diagnosis not present

## 2021-03-27 DIAGNOSIS — N401 Enlarged prostate with lower urinary tract symptoms: Secondary | ICD-10-CM | POA: Diagnosis not present

## 2021-03-27 DIAGNOSIS — M6281 Muscle weakness (generalized): Secondary | ICD-10-CM | POA: Diagnosis not present

## 2021-03-27 DIAGNOSIS — N1831 Chronic kidney disease, stage 3a: Secondary | ICD-10-CM | POA: Diagnosis not present

## 2021-03-27 DIAGNOSIS — Z7982 Long term (current) use of aspirin: Secondary | ICD-10-CM | POA: Diagnosis not present

## 2021-03-27 DIAGNOSIS — E559 Vitamin D deficiency, unspecified: Secondary | ICD-10-CM | POA: Diagnosis not present

## 2021-03-27 DIAGNOSIS — E782 Mixed hyperlipidemia: Secondary | ICD-10-CM | POA: Diagnosis not present

## 2021-03-27 DIAGNOSIS — G20 Parkinson's disease: Secondary | ICD-10-CM | POA: Diagnosis not present

## 2021-03-27 DIAGNOSIS — H548 Legal blindness, as defined in USA: Secondary | ICD-10-CM | POA: Diagnosis not present

## 2021-03-27 DIAGNOSIS — G4700 Insomnia, unspecified: Secondary | ICD-10-CM | POA: Diagnosis not present

## 2021-03-27 DIAGNOSIS — H4720 Unspecified optic atrophy: Secondary | ICD-10-CM | POA: Diagnosis not present

## 2021-03-27 DIAGNOSIS — E1122 Type 2 diabetes mellitus with diabetic chronic kidney disease: Secondary | ICD-10-CM | POA: Diagnosis not present

## 2021-03-27 DIAGNOSIS — Z9181 History of falling: Secondary | ICD-10-CM | POA: Diagnosis not present

## 2021-03-27 DIAGNOSIS — E46 Unspecified protein-calorie malnutrition: Secondary | ICD-10-CM | POA: Diagnosis not present

## 2021-03-27 DIAGNOSIS — I129 Hypertensive chronic kidney disease with stage 1 through stage 4 chronic kidney disease, or unspecified chronic kidney disease: Secondary | ICD-10-CM | POA: Diagnosis not present

## 2021-03-27 DIAGNOSIS — N138 Other obstructive and reflux uropathy: Secondary | ICD-10-CM | POA: Diagnosis not present

## 2021-03-27 DIAGNOSIS — Z466 Encounter for fitting and adjustment of urinary device: Secondary | ICD-10-CM | POA: Diagnosis not present

## 2021-03-27 DIAGNOSIS — Z7984 Long term (current) use of oral hypoglycemic drugs: Secondary | ICD-10-CM | POA: Diagnosis not present

## 2021-03-27 DIAGNOSIS — N320 Bladder-neck obstruction: Secondary | ICD-10-CM | POA: Diagnosis not present

## 2021-03-27 DIAGNOSIS — Z8616 Personal history of COVID-19: Secondary | ICD-10-CM | POA: Diagnosis not present

## 2021-03-27 DIAGNOSIS — T796XXD Traumatic ischemia of muscle, subsequent encounter: Secondary | ICD-10-CM | POA: Diagnosis not present

## 2021-03-27 DIAGNOSIS — G5711 Meralgia paresthetica, right lower limb: Secondary | ICD-10-CM | POA: Diagnosis not present

## 2021-03-27 DIAGNOSIS — F32A Depression, unspecified: Secondary | ICD-10-CM | POA: Diagnosis not present

## 2021-03-28 DIAGNOSIS — E1122 Type 2 diabetes mellitus with diabetic chronic kidney disease: Secondary | ICD-10-CM | POA: Diagnosis not present

## 2021-03-28 DIAGNOSIS — N401 Enlarged prostate with lower urinary tract symptoms: Secondary | ICD-10-CM | POA: Diagnosis not present

## 2021-03-28 DIAGNOSIS — Z466 Encounter for fitting and adjustment of urinary device: Secondary | ICD-10-CM | POA: Diagnosis not present

## 2021-03-28 DIAGNOSIS — N138 Other obstructive and reflux uropathy: Secondary | ICD-10-CM | POA: Diagnosis not present

## 2021-03-28 DIAGNOSIS — T796XXD Traumatic ischemia of muscle, subsequent encounter: Secondary | ICD-10-CM | POA: Diagnosis not present

## 2021-03-28 DIAGNOSIS — I129 Hypertensive chronic kidney disease with stage 1 through stage 4 chronic kidney disease, or unspecified chronic kidney disease: Secondary | ICD-10-CM | POA: Diagnosis not present

## 2021-03-30 ENCOUNTER — Telehealth: Payer: Self-pay

## 2021-03-30 ENCOUNTER — Other Ambulatory Visit: Payer: Self-pay | Admitting: *Deleted

## 2021-03-30 NOTE — Patient Outreach (Signed)
[facility]) Care Management  03/30/2021  Normand Damron 1955-04-21 [id number]   Transition of Care  RN spoke with pt today concerning [facility] services due to his recent SNF discharge. Pt explains some of his medical conditions that are no longer an issues indicating his blood pressures are stable and regulate. States he is no longer on insulin (SSI) and due to his blindness he requires assistance from his nearby sister Jayd Cadieux. Pt has requesting RN to call his sister for details concerning his medications and pending appointments. Pt confirms [facility] for [facility] services. Pt also confirmed he may transition in the future ans Marthenia Rolling (SNF liaison) sent a referral to the care guides to assist pt with this task.  RN attempted outreach call to the pt's sister Caren Griffins however only able to leave a HIPAA approved voice message requesting a call back.  Will rescheduled another call back this week for pending services.  Raina Mina, RN Care Management Coordinator [place] Office [phone]

## 2021-03-30 NOTE — Telephone Encounter (Signed)
° °  Telephone encounter was:  Successful.  03/30/2021 Name: Dustin Duffy MRN: [id number] DOB: Jun 02, 1955  Dustin Duffy is a 66 y.o. year old male who is a primary care patient of Janie Morning, DO . The community resource team was consulted for assistance with Transportation Needs   Care guide performed the following interventions:  Per sister transportation assistance is needed when she is unable to take pt to appts. Currently, pt next appt isn't scheuled. I advised sister to contact me if an appt comes up. I sent [place] by Mail & will contact Medicare as well.  Follow Up Plan:  Care guide will outreach resources to assist patient with Transportation.  [place] management  [place], [place]  Main Phone: [phone]   E-mail: [email or web address]  Website: [email or web address]

## 2021-04-02 ENCOUNTER — Encounter: Payer: Self-pay | Admitting: *Deleted

## 2021-04-02 ENCOUNTER — Other Ambulatory Visit: Payer: Self-pay | Admitting: *Deleted

## 2021-04-02 DIAGNOSIS — Z466 Encounter for fitting and adjustment of urinary device: Secondary | ICD-10-CM | POA: Diagnosis not present

## 2021-04-02 DIAGNOSIS — E1122 Type 2 diabetes mellitus with diabetic chronic kidney disease: Secondary | ICD-10-CM | POA: Diagnosis not present

## 2021-04-02 DIAGNOSIS — N401 Enlarged prostate with lower urinary tract symptoms: Secondary | ICD-10-CM | POA: Diagnosis not present

## 2021-04-02 DIAGNOSIS — N138 Other obstructive and reflux uropathy: Secondary | ICD-10-CM | POA: Diagnosis not present

## 2021-04-02 DIAGNOSIS — I129 Hypertensive chronic kidney disease with stage 1 through stage 4 chronic kidney disease, or unspecified chronic kidney disease: Secondary | ICD-10-CM | POA: Diagnosis not present

## 2021-04-02 DIAGNOSIS — T796XXD Traumatic ischemia of muscle, subsequent encounter: Secondary | ICD-10-CM | POA: Diagnosis not present

## 2021-04-02 NOTE — Patient Outreach (Signed)
[place] [facility]) Care Management  [place]  04/02/2021   Dustin Duffy Apr 15, 1955 [id number]  Spoke with the guardian Merion Grimaldo) for this pt today and enrolled into the [facility] program and services. Completed the initial assessment for Diabetes management of care while awaiting pt's new endocrinologist to intervene with a possible glucometer for daily CBG due to pt's limitation with poor vision.   Will follow up in a few weeks concerning pt's ongoing management of care related to all discussed for ongoing monitoring. Will send AVS and goals discussed today. Will also alert pt's provider on his disposition with [facility] services.  Encounter Medications:  Outpatient Encounter Medications as of 04/02/2021  Medication Sig   amitriptyline (ELAVIL) 50 MG tablet Take 50 mg by mouth at bedtime.   aspirin EC 81 MG tablet Take 81 mg by mouth daily. Swallow whole.   cholecalciferol (VITAMIN D3) 25 MCG (1000 UNIT) tablet Take 1,000 Units by mouth 3 (three) times a week.   Dapagliflozin-metFORMIN HCl ER (XIGDUO XR) 10-500 MG TB24 Take 10 capsules by mouth daily.   gabapentin (NEURONTIN) 300 MG capsule Take 300 mg by mouth 3 (three) times daily.   simvastatin (ZOCOR) 20 MG tablet Take 20 mg by mouth every evening.   tamsulosin (FLOMAX) 0.4 MG CAPS capsule Take 0.4 mg by mouth 2 (two) times daily.   glipiZIDE (GLUCOTROL) 5 MG tablet Take 0.5 tablets (2.5 mg total) by mouth daily before breakfast.   insulin aspart (NOVOLOG) 100 unit/mL injection Sliding scale: CBG 70 - 120: 0 units, CBG 121 - 150: 1 unit, CBG 151 - 200: 2 units, CBG 201 - 250: 3 units, CBG 251 - 300: 5 units, CBG 301 - 350: 7 units, CBG 351 - 400: 9 units, CBG > 400: call facility MD (Patient not taking: Reported on 03/30/2021)   metoprolol tartrate (LOPRESSOR) 25 MG tablet Take 1 tablet (25 mg total) by mouth 2 (two) times daily.   No facility-administered encounter medications on file as of 04/02/2021.    Functional  Status:  In your present state of health, do you have any difficulty performing the following activities: 04/02/2021 02/09/2021  Hearing? N N  Vision? Y Y  Comment Blind -  Difficulty concentrating or making decisions? [place] with this task -  Walking or climbing stairs? Y Y  Comment Uses a cane -  Dressing or bathing? N N  Doing errands, shopping? Tempie Donning  Comment assistance withthis task -  Conservation officer, nature and eating ? N -  Using the Toilet? N -  In the past six months, have you accidently leaked urine? N -  Do you have problems with loss of bowel control? N -  Managing your Medications? Y -  Comment assistance with this task -  Managing your Finances? Y -  Comment assistance with this task -  Housekeeping or managing your Housekeeping? Y -  Comment assistance with this task and offered careguides for resources -  Some recent data might be hidden    Fall/Depression Screening: Fall Risk  04/02/2021 10/26/2018  Falls in the past year? 1 1  Comment - [place]i Telephone Survey: data to providers prior to load  Number falls in past yr: 0 1  Comment - [place]i Telephone Survey Actual Response = 2  Injury with Fall? 1 0  Risk for fall due to : History of fall(s);Impaired vision -  Follow up Education provided;Falls prevention discussed -   PHQ 2/9 Scores 04/02/2021  PHQ - 2 Score 0    Assessment:   Care Plan Care Plan : [place] of Care  Updates made by Tobi Bastos, RN since 04/02/2021 12:00 AM     Problem: Knowledge Deficit related to CHF and Care Coordination Needs   Priority: High     Long-Range Goal: Development plan of care for [name]   Start Date: 04/02/2021  Expected End Date: 07/23/2021  Priority: High  Note:   Current Barriers:  Knowledge Deficits related to plan of care for management of [name]   RNCM Clinical Goal(s):  Patient will verbalize understanding of plan for management of [name] as evidenced by lowering his A1C and CBG  readouts. take all medications exactly as prescribed and will call provider for medication related questions as evidenced by self report, chart notification and  through collaboration with RN Care manager, provider, and care team.   Interventions: Inter-disciplinary care team collaboration (see longitudinal plan of care) Evaluation of current treatment plan related to  self management and patient's adherence to plan as established by provider   Diabetes Interventions:  (Status:  New goal.) Long Term Goal Assessed patient's understanding of A1c goal: <7% Provided education to patient about basic DM disease process Reviewed medications with patient and discussed importance of medication adherence Counseled on importance of regular laboratory monitoring as prescribed Discussed plans with patient for ongoing care management follow up and provided patient with direct contact information for care management team Provided patient with written educational materials related to hypo and hyperglycemia and importance of correct treatment Reviewed scheduled/upcoming provider appointments including: Endocrinologist 1/11 Advised patient, providing education and rationale, to check cbg regularly on the secondary unit as best as the pt is able to complete this readings (blind) with limited capability until the new devices is obtained and record, calling endocrinologist for findings outside established parameters Screening for signs and symptoms of depression related to chronic disease state  Lab Results  Component Value Date   HGBA1C 12.6 (H) 02/09/2021   Patient Goals/Self-Care Activities: Take all medications as prescribed Attend all scheduled provider appointments Call pharmacy for medication refills 3-7 days in advance of running out of medications Perform all self care activities independently  Call provider office for new concerns or questions  check feet daily for cuts, sores or redness take the  blood sugar log to all doctor visits drink 6 to 8 glasses of water each day fill half of plate with vegetables limit fast food meals to no more than 1 per week manage portion size prepare main meal at home 3 to 5 days each week set a realistic goal wash and dry feet carefully every day wear comfortable, cotton socks wear comfortable, well-fitting shoes  Follow Up Plan:  Telephone follow up appointment with care management team member scheduled for:  Feb 2023        Raina Mina, RN Care Management Coordinator [place] Office [phone]

## 2021-04-02 NOTE — Patient Instructions (Signed)
Visit Information  Thank you for taking time to visit with me today. Please don't hesitate to contact me if I can be of assistance to you before our next scheduled telephone appointment.  Following are the goals we discussed today:   Take all medications as prescribed Attend all scheduled provider appointments Call pharmacy for medication refills 3-7 days in advance of running out of medications Perform all self care activities independently  Call provider office for new concerns or questions  check feet daily for cuts, sores or redness take the blood sugar log to all doctor visits drink 6 to 8 glasses of water each day fill half of plate with vegetables limit fast food meals to no more than 1 per week manage portion size prepare main meal at home 3 to 5 days each week set a realistic goal wash and dry feet carefully every day wear comfortable, cotton socks wear comfortable, well-fitting shoes

## 2021-04-05 DIAGNOSIS — N401 Enlarged prostate with lower urinary tract symptoms: Secondary | ICD-10-CM | POA: Diagnosis not present

## 2021-04-05 DIAGNOSIS — I129 Hypertensive chronic kidney disease with stage 1 through stage 4 chronic kidney disease, or unspecified chronic kidney disease: Secondary | ICD-10-CM | POA: Diagnosis not present

## 2021-04-05 DIAGNOSIS — H548 Legal blindness, as defined in USA: Secondary | ICD-10-CM | POA: Diagnosis not present

## 2021-04-05 DIAGNOSIS — E1122 Type 2 diabetes mellitus with diabetic chronic kidney disease: Secondary | ICD-10-CM | POA: Diagnosis not present

## 2021-04-05 DIAGNOSIS — R000 Tachycardia, unspecified: Secondary | ICD-10-CM | POA: Diagnosis not present

## 2021-04-05 DIAGNOSIS — E1140 Type 2 diabetes mellitus with diabetic neuropathy, unspecified: Secondary | ICD-10-CM | POA: Diagnosis not present

## 2021-04-05 DIAGNOSIS — H4720 Unspecified optic atrophy: Secondary | ICD-10-CM | POA: Diagnosis not present

## 2021-04-06 DIAGNOSIS — E1122 Type 2 diabetes mellitus with diabetic chronic kidney disease: Secondary | ICD-10-CM | POA: Diagnosis not present

## 2021-04-06 DIAGNOSIS — C20 Malignant neoplasm of rectum: Secondary | ICD-10-CM | POA: Diagnosis not present

## 2021-04-06 DIAGNOSIS — Z466 Encounter for fitting and adjustment of urinary device: Secondary | ICD-10-CM | POA: Diagnosis not present

## 2021-04-06 DIAGNOSIS — N138 Other obstructive and reflux uropathy: Secondary | ICD-10-CM | POA: Diagnosis not present

## 2021-04-06 DIAGNOSIS — T796XXD Traumatic ischemia of muscle, subsequent encounter: Secondary | ICD-10-CM | POA: Diagnosis not present

## 2021-04-06 DIAGNOSIS — N401 Enlarged prostate with lower urinary tract symptoms: Secondary | ICD-10-CM | POA: Diagnosis not present

## 2021-04-06 DIAGNOSIS — E782 Mixed hyperlipidemia: Secondary | ICD-10-CM | POA: Diagnosis not present

## 2021-04-06 DIAGNOSIS — I129 Hypertensive chronic kidney disease with stage 1 through stage 4 chronic kidney disease, or unspecified chronic kidney disease: Secondary | ICD-10-CM | POA: Diagnosis not present

## 2021-04-06 DIAGNOSIS — N1831 Chronic kidney disease, stage 3a: Secondary | ICD-10-CM | POA: Diagnosis not present

## 2021-04-07 DIAGNOSIS — H548 Legal blindness, as defined in USA: Secondary | ICD-10-CM | POA: Diagnosis not present

## 2021-04-07 DIAGNOSIS — N138 Other obstructive and reflux uropathy: Secondary | ICD-10-CM | POA: Diagnosis not present

## 2021-04-07 DIAGNOSIS — E1122 Type 2 diabetes mellitus with diabetic chronic kidney disease: Secondary | ICD-10-CM | POA: Diagnosis not present

## 2021-04-07 DIAGNOSIS — Z466 Encounter for fitting and adjustment of urinary device: Secondary | ICD-10-CM | POA: Diagnosis not present

## 2021-04-07 DIAGNOSIS — N401 Enlarged prostate with lower urinary tract symptoms: Secondary | ICD-10-CM | POA: Diagnosis not present

## 2021-04-07 DIAGNOSIS — E785 Hyperlipidemia, unspecified: Secondary | ICD-10-CM | POA: Diagnosis not present

## 2021-04-07 DIAGNOSIS — I129 Hypertensive chronic kidney disease with stage 1 through stage 4 chronic kidney disease, or unspecified chronic kidney disease: Secondary | ICD-10-CM | POA: Diagnosis not present

## 2021-04-07 DIAGNOSIS — H4720 Unspecified optic atrophy: Secondary | ICD-10-CM | POA: Diagnosis not present

## 2021-04-07 DIAGNOSIS — T796XXD Traumatic ischemia of muscle, subsequent encounter: Secondary | ICD-10-CM | POA: Diagnosis not present

## 2021-04-07 DIAGNOSIS — Z6823 Body mass index (BMI) 23.0-23.9, adult: Secondary | ICD-10-CM | POA: Diagnosis not present

## 2021-04-07 DIAGNOSIS — R296 Repeated falls: Secondary | ICD-10-CM | POA: Diagnosis not present

## 2021-04-07 DIAGNOSIS — E1169 Type 2 diabetes mellitus with other specified complication: Secondary | ICD-10-CM | POA: Diagnosis not present

## 2021-04-08 DIAGNOSIS — Z466 Encounter for fitting and adjustment of urinary device: Secondary | ICD-10-CM | POA: Diagnosis not present

## 2021-04-08 DIAGNOSIS — N401 Enlarged prostate with lower urinary tract symptoms: Secondary | ICD-10-CM | POA: Diagnosis not present

## 2021-04-08 DIAGNOSIS — E1122 Type 2 diabetes mellitus with diabetic chronic kidney disease: Secondary | ICD-10-CM | POA: Diagnosis not present

## 2021-04-08 DIAGNOSIS — N138 Other obstructive and reflux uropathy: Secondary | ICD-10-CM | POA: Diagnosis not present

## 2021-04-08 DIAGNOSIS — T796XXD Traumatic ischemia of muscle, subsequent encounter: Secondary | ICD-10-CM | POA: Diagnosis not present

## 2021-04-08 DIAGNOSIS — I129 Hypertensive chronic kidney disease with stage 1 through stage 4 chronic kidney disease, or unspecified chronic kidney disease: Secondary | ICD-10-CM | POA: Diagnosis not present

## 2021-04-13 DIAGNOSIS — T796XXD Traumatic ischemia of muscle, subsequent encounter: Secondary | ICD-10-CM | POA: Diagnosis not present

## 2021-04-13 DIAGNOSIS — N138 Other obstructive and reflux uropathy: Secondary | ICD-10-CM | POA: Diagnosis not present

## 2021-04-13 DIAGNOSIS — Z466 Encounter for fitting and adjustment of urinary device: Secondary | ICD-10-CM | POA: Diagnosis not present

## 2021-04-13 DIAGNOSIS — E1122 Type 2 diabetes mellitus with diabetic chronic kidney disease: Secondary | ICD-10-CM | POA: Diagnosis not present

## 2021-04-13 DIAGNOSIS — N401 Enlarged prostate with lower urinary tract symptoms: Secondary | ICD-10-CM | POA: Diagnosis not present

## 2021-04-13 DIAGNOSIS — I129 Hypertensive chronic kidney disease with stage 1 through stage 4 chronic kidney disease, or unspecified chronic kidney disease: Secondary | ICD-10-CM | POA: Diagnosis not present

## 2021-04-16 DIAGNOSIS — Z466 Encounter for fitting and adjustment of urinary device: Secondary | ICD-10-CM | POA: Diagnosis not present

## 2021-04-16 DIAGNOSIS — N138 Other obstructive and reflux uropathy: Secondary | ICD-10-CM | POA: Diagnosis not present

## 2021-04-16 DIAGNOSIS — N401 Enlarged prostate with lower urinary tract symptoms: Secondary | ICD-10-CM | POA: Diagnosis not present

## 2021-04-16 DIAGNOSIS — I129 Hypertensive chronic kidney disease with stage 1 through stage 4 chronic kidney disease, or unspecified chronic kidney disease: Secondary | ICD-10-CM | POA: Diagnosis not present

## 2021-04-16 DIAGNOSIS — E1122 Type 2 diabetes mellitus with diabetic chronic kidney disease: Secondary | ICD-10-CM | POA: Diagnosis not present

## 2021-04-16 DIAGNOSIS — T796XXD Traumatic ischemia of muscle, subsequent encounter: Secondary | ICD-10-CM | POA: Diagnosis not present

## 2021-04-20 DIAGNOSIS — Z466 Encounter for fitting and adjustment of urinary device: Secondary | ICD-10-CM | POA: Diagnosis not present

## 2021-04-20 DIAGNOSIS — I129 Hypertensive chronic kidney disease with stage 1 through stage 4 chronic kidney disease, or unspecified chronic kidney disease: Secondary | ICD-10-CM | POA: Diagnosis not present

## 2021-04-20 DIAGNOSIS — E1122 Type 2 diabetes mellitus with diabetic chronic kidney disease: Secondary | ICD-10-CM | POA: Diagnosis not present

## 2021-04-20 DIAGNOSIS — T796XXD Traumatic ischemia of muscle, subsequent encounter: Secondary | ICD-10-CM | POA: Diagnosis not present

## 2021-04-20 DIAGNOSIS — N401 Enlarged prostate with lower urinary tract symptoms: Secondary | ICD-10-CM | POA: Diagnosis not present

## 2021-04-20 DIAGNOSIS — N138 Other obstructive and reflux uropathy: Secondary | ICD-10-CM | POA: Diagnosis not present

## 2021-04-21 ENCOUNTER — Telehealth: Payer: Self-pay

## 2021-04-21 NOTE — Telephone Encounter (Signed)
° °  Telephone encounter was:  Successful.  04/21/2021 Name: Dustin Duffy MRN: [id number] DOB: Jan 25, 1956  Dustin Duffy is a 66 y.o. year old male who is a primary care patient of Janie Morning, DO . The community resource team was consulted for assistance with Transportation Needs   Care guide performed the following interventions:  Left messages advising Medicare does not cover ground transportation. However, transportation applications were sent out 03/31/2021.  Follow Up Plan:  Care guide will follow up with patient by phone over the next few days.  [place] management  [place], [place]  Main Phone: [phone]   E-mail: [email or web address]  Website: [email or web address]

## 2021-04-23 DIAGNOSIS — N138 Other obstructive and reflux uropathy: Secondary | ICD-10-CM | POA: Diagnosis not present

## 2021-04-23 DIAGNOSIS — E1122 Type 2 diabetes mellitus with diabetic chronic kidney disease: Secondary | ICD-10-CM | POA: Diagnosis not present

## 2021-04-23 DIAGNOSIS — Z466 Encounter for fitting and adjustment of urinary device: Secondary | ICD-10-CM | POA: Diagnosis not present

## 2021-04-23 DIAGNOSIS — N401 Enlarged prostate with lower urinary tract symptoms: Secondary | ICD-10-CM | POA: Diagnosis not present

## 2021-04-23 DIAGNOSIS — T796XXD Traumatic ischemia of muscle, subsequent encounter: Secondary | ICD-10-CM | POA: Diagnosis not present

## 2021-04-23 DIAGNOSIS — I129 Hypertensive chronic kidney disease with stage 1 through stage 4 chronic kidney disease, or unspecified chronic kidney disease: Secondary | ICD-10-CM | POA: Diagnosis not present

## 2021-04-26 DIAGNOSIS — F32A Depression, unspecified: Secondary | ICD-10-CM | POA: Diagnosis not present

## 2021-04-26 DIAGNOSIS — I129 Hypertensive chronic kidney disease with stage 1 through stage 4 chronic kidney disease, or unspecified chronic kidney disease: Secondary | ICD-10-CM | POA: Diagnosis not present

## 2021-04-26 DIAGNOSIS — N138 Other obstructive and reflux uropathy: Secondary | ICD-10-CM | POA: Diagnosis not present

## 2021-04-26 DIAGNOSIS — Z466 Encounter for fitting and adjustment of urinary device: Secondary | ICD-10-CM | POA: Diagnosis not present

## 2021-04-26 DIAGNOSIS — M6281 Muscle weakness (generalized): Secondary | ICD-10-CM | POA: Diagnosis not present

## 2021-04-26 DIAGNOSIS — G4700 Insomnia, unspecified: Secondary | ICD-10-CM | POA: Diagnosis not present

## 2021-04-26 DIAGNOSIS — Z7984 Long term (current) use of oral hypoglycemic drugs: Secondary | ICD-10-CM | POA: Diagnosis not present

## 2021-04-26 DIAGNOSIS — E559 Vitamin D deficiency, unspecified: Secondary | ICD-10-CM | POA: Diagnosis not present

## 2021-04-26 DIAGNOSIS — N1831 Chronic kidney disease, stage 3a: Secondary | ICD-10-CM | POA: Diagnosis not present

## 2021-04-26 DIAGNOSIS — E1122 Type 2 diabetes mellitus with diabetic chronic kidney disease: Secondary | ICD-10-CM | POA: Diagnosis not present

## 2021-04-26 DIAGNOSIS — E1140 Type 2 diabetes mellitus with diabetic neuropathy, unspecified: Secondary | ICD-10-CM | POA: Diagnosis not present

## 2021-04-26 DIAGNOSIS — E782 Mixed hyperlipidemia: Secondary | ICD-10-CM | POA: Diagnosis not present

## 2021-04-26 DIAGNOSIS — H548 Legal blindness, as defined in USA: Secondary | ICD-10-CM | POA: Diagnosis not present

## 2021-04-26 DIAGNOSIS — E46 Unspecified protein-calorie malnutrition: Secondary | ICD-10-CM | POA: Diagnosis not present

## 2021-04-26 DIAGNOSIS — T796XXD Traumatic ischemia of muscle, subsequent encounter: Secondary | ICD-10-CM | POA: Diagnosis not present

## 2021-04-26 DIAGNOSIS — G5711 Meralgia paresthetica, right lower limb: Secondary | ICD-10-CM | POA: Diagnosis not present

## 2021-04-26 DIAGNOSIS — H4720 Unspecified optic atrophy: Secondary | ICD-10-CM | POA: Diagnosis not present

## 2021-04-26 DIAGNOSIS — N320 Bladder-neck obstruction: Secondary | ICD-10-CM | POA: Diagnosis not present

## 2021-04-26 DIAGNOSIS — N401 Enlarged prostate with lower urinary tract symptoms: Secondary | ICD-10-CM | POA: Diagnosis not present

## 2021-04-26 DIAGNOSIS — Z8616 Personal history of COVID-19: Secondary | ICD-10-CM | POA: Diagnosis not present

## 2021-04-26 DIAGNOSIS — Z9181 History of falling: Secondary | ICD-10-CM | POA: Diagnosis not present

## 2021-04-26 DIAGNOSIS — Z7982 Long term (current) use of aspirin: Secondary | ICD-10-CM | POA: Diagnosis not present

## 2021-04-26 DIAGNOSIS — G20 Parkinson's disease: Secondary | ICD-10-CM | POA: Diagnosis not present

## 2021-04-27 ENCOUNTER — Telehealth: Payer: Self-pay

## 2021-04-27 DIAGNOSIS — N138 Other obstructive and reflux uropathy: Secondary | ICD-10-CM | POA: Diagnosis not present

## 2021-04-27 DIAGNOSIS — N401 Enlarged prostate with lower urinary tract symptoms: Secondary | ICD-10-CM | POA: Diagnosis not present

## 2021-04-27 DIAGNOSIS — T796XXD Traumatic ischemia of muscle, subsequent encounter: Secondary | ICD-10-CM | POA: Diagnosis not present

## 2021-04-27 DIAGNOSIS — E1122 Type 2 diabetes mellitus with diabetic chronic kidney disease: Secondary | ICD-10-CM | POA: Diagnosis not present

## 2021-04-27 DIAGNOSIS — Z466 Encounter for fitting and adjustment of urinary device: Secondary | ICD-10-CM | POA: Diagnosis not present

## 2021-04-27 DIAGNOSIS — I129 Hypertensive chronic kidney disease with stage 1 through stage 4 chronic kidney disease, or unspecified chronic kidney disease: Secondary | ICD-10-CM | POA: Diagnosis not present

## 2021-04-27 NOTE — Telephone Encounter (Signed)
° °  Telephone encounter was:  Successful.  04/27/2021 Name: Coye Dawood MRN: [id number] DOB: 03-25-1956  Addison Whidbee is a 66 y.o. year old male who is a primary care patient of Janie Morning, DO . The community resource team was consulted for assistance with Transportation Needs   Care guide performed the following interventions: Sister advised she has received the transportation applications by mail ([employer] [place] and [employer]). At this time, she advised she does not have any further questions or concerns..  Follow Up Plan:  No further follow up planned at this time. The patient has been provided with needed resources.  [place] management  [place], [place]  Main Phone: [phone]   E-mail: [email or web address]  Website: [email or web address]

## 2021-04-28 ENCOUNTER — Other Ambulatory Visit: Payer: Self-pay | Admitting: *Deleted

## 2021-04-28 NOTE — Patient Outreach (Signed)
[facility]) Care Management  04/28/2021  Dustin Duffy 12-21-1955 [id number]   Telephone Assessment-Unsuccessful  RN attempted to call the sister Dustin Duffy) as instructed however unable to leave a voice message. Also called the pt directly but unsuccessful and unable to leave a message.   Will rescheduled another outreach call over the next week for ongoing [facility] services.  Raina Mina, RN Care Management Coordinator [place] Office [phone]

## 2021-05-03 ENCOUNTER — Other Ambulatory Visit: Payer: Self-pay | Admitting: *Deleted

## 2021-05-03 NOTE — Patient Instructions (Signed)
Visit Information  Thank you for taking time to visit with me today. Please don't hesitate to contact me if I can be of assistance to you before our next scheduled telephone appointment.  Following are the goals we discussed today:   Take all medications as prescribed Attend all scheduled provider appointments Call pharmacy for medication refills 3-7 days in advance of running out of medications Perform all self care activities independently  Call provider office for new concerns or questions  check feet daily for cuts, sores or redness take the blood sugar log to all doctor visits drink 6 to 8 glasses of water each day fill half of plate with vegetables limit fast food meals to no more than 1 per week manage portion size prepare main meal at home 3 to 5 days each week set a realistic goal wash and dry feet carefully every day wear comfortable, cotton socks wear comfortable, well-fitting shoes

## 2021-05-03 NOTE — Patient Outreach (Signed)
[facility]) Care Management Telephonic RN Care Manager Note   05/03/2021 Name:  Dustin Duffy MRN:  [id number] DOB:  09-08-1955  Recommendations/Changes made from today's visit: Updates documented within the plan of care Subjective: Dustin Duffy is an 66 y.o. year old male who is a primary patient of Janie Morning, DO. The care management team was consulted for assistance with care management and/or care coordination needs.    Telephonic RN Care Manager completed Telephone Visit today.  Objective:   Medications Reviewed Today     Reviewed by Tobi Bastos, RN (Registered Nurse) on 03/30/21 at 1641  Med List Status: <None>   Medication Order Taking? Sig Documenting Provider Last Dose Status Informant  amitriptyline (ELAVIL) 50 MG tablet [id number] Yes Take 50 mg by mouth at bedtime. [provider] Taking Active Self  aspirin EC 81 MG tablet [id number] Yes Take 81 mg by mouth daily. Swallow whole. [provider] Taking Active Self  cholecalciferol (VITAMIN D3) 25 MCG (1000 UNIT) tablet [id number] Yes Take 1,000 Units by mouth 3 (three) times a week. [provider] Taking Active Self  gabapentin (NEURONTIN) 300 MG capsule [id number] Yes Take 300 mg by mouth 3 (three) times daily. [provider] Taking Active Self  glipiZIDE (GLUCOTROL) 5 MG tablet [id number]  Take 0.5 tablets (2.5 mg total) by mouth daily before breakfast. Donne Hazel, MD  Expired 03/18/21 2359   insulin aspart (NOVOLOG) 100 unit/mL injection [id number] No Sliding scale: CBG 70 - 120: 0 units, CBG 121 - 150: 1 unit, CBG 151 - 200: 2 units, CBG 201 - 250: 3 units, CBG 251 - 300: 5 units, CBG 301 - 350: 7 units, CBG 351 - 400: 9 units, CBG > 400: call facility MD  Patient not taking: Reported on 03/30/2021   Donne Hazel, MD Not Taking Active   metoprolol tartrate (LOPRESSOR) 25 MG tablet [id number]  Take 1 tablet (25 mg total) by mouth 2 (two) times daily. Donne Hazel, MD  Expired 03/17/21 2359   simvastatin (ZOCOR) 20 MG tablet [id number] Yes Take 20 mg by mouth every evening. [provider] Taking Active Self  tamsulosin (FLOMAX) 0.4 MG CAPS capsule [id number] Yes Take 0.4 mg by mouth 2 (two) times daily. [provider] Taking Active Self             SDOH:  (Social Determinants of Health) assessments and interventions performed:     Care Plan  Review of patient past medical history, allergies, medications, health status, including review of consultants reports, laboratory and other test data, was performed as part of comprehensive evaluation for care management services.   Care Plan : [place] of Care  Updates made by Tobi Bastos, RN since 05/03/2021 12:00 AM     Problem: Knowledge Deficit related to CHF and Care Coordination Needs   Priority: High     Long-Range Goal: Development plan of care for [name]   Start Date: 04/02/2021  Expected End Date: 07/23/2021  This Visit's Progress: On track  Priority: High  Note:   Current Barriers:  Knowledge Deficits related to plan of care for management of [name]   RNCM Clinical Goal(s):  Patient will verbalize understanding of plan for management of [name] as evidenced by lowering his A1C and CBG readouts. take all medications exactly as prescribed and will call provider for medication related questions as evidenced by self report, chart notification and  through collaboration with RN Care  manager, provider, and care team.   Interventions: Inter-disciplinary care team collaboration (see longitudinal plan of care) Evaluation of current treatment plan related to  self management and patient's adherence to plan as established by provider   Diabetes Interventions:  (Status:  New goal.) Long Term Goal Assessed patient's understanding of A1c goal: <7% Provided education to patient about basic DM disease process Reviewed medications with patient and discussed  importance of medication adherence Counseled on importance of regular laboratory monitoring as prescribed Discussed plans with patient for ongoing care management follow up and provided patient with direct contact information for care management team Provided patient with written educational materials related to hypo and hyperglycemia and importance of correct treatment Reviewed scheduled/upcoming provider appointments including: Endocrinologist  March 2023 Advised patient, providing education and rationale, to check cbg regularly on the secondary unit as best as the pt is able to complete this readings (blind) with limited capability until the new devices is obtained and record, calling endocrinologist for findings outside established parameters Screening for signs and symptoms of depression related to chronic disease state  Lab Results  Component Value Date   HGBA1C 12.6 (H) 02/09/2021  2/6 Update- Pt reports he is doing well with CBG 160-170 increased from last quarter but many changes with a new endocrinologist, changes in his dietary habits, and increase in glucose monitoring throughout the daily. Educated on the importance of reducing his A1C and checking his lower extremities for any sores or changes. Healthy choices discussed and much encouragements on food items to avoid with high protein and low carbs. Will follow up next month after visit with endocrinologist. No additional needs at this time.  Patient Goals/Self-Care Activities: Take all medications as prescribed Attend all scheduled provider appointments Call pharmacy for medication refills 3-7 days in advance of running out of medications Perform all self care activities independently  Call provider office for new concerns or questions  check feet daily for cuts, sores or redness take the blood sugar log to all doctor visits drink 6 to 8 glasses of water each day fill half of plate with vegetables limit fast food meals to no more  than 1 per week manage portion size prepare main meal at home 3 to 5 days each week set a realistic goal wash and dry feet carefully every day wear comfortable, cotton socks wear comfortable, well-fitting shoes  Follow Up Plan:  Telephone follow up appointment with care management team member scheduled for:  March 2023       Raina Mina, RN Care Management Coordinator [place] Office [phone]

## 2021-05-04 DIAGNOSIS — Z466 Encounter for fitting and adjustment of urinary device: Secondary | ICD-10-CM | POA: Diagnosis not present

## 2021-05-04 DIAGNOSIS — N138 Other obstructive and reflux uropathy: Secondary | ICD-10-CM | POA: Diagnosis not present

## 2021-05-04 DIAGNOSIS — E1122 Type 2 diabetes mellitus with diabetic chronic kidney disease: Secondary | ICD-10-CM | POA: Diagnosis not present

## 2021-05-04 DIAGNOSIS — N401 Enlarged prostate with lower urinary tract symptoms: Secondary | ICD-10-CM | POA: Diagnosis not present

## 2021-05-04 DIAGNOSIS — I129 Hypertensive chronic kidney disease with stage 1 through stage 4 chronic kidney disease, or unspecified chronic kidney disease: Secondary | ICD-10-CM | POA: Diagnosis not present

## 2021-05-04 DIAGNOSIS — T796XXD Traumatic ischemia of muscle, subsequent encounter: Secondary | ICD-10-CM | POA: Diagnosis not present

## 2021-05-06 DIAGNOSIS — N138 Other obstructive and reflux uropathy: Secondary | ICD-10-CM | POA: Diagnosis not present

## 2021-05-06 DIAGNOSIS — Z466 Encounter for fitting and adjustment of urinary device: Secondary | ICD-10-CM | POA: Diagnosis not present

## 2021-05-06 DIAGNOSIS — N401 Enlarged prostate with lower urinary tract symptoms: Secondary | ICD-10-CM | POA: Diagnosis not present

## 2021-05-06 DIAGNOSIS — T796XXD Traumatic ischemia of muscle, subsequent encounter: Secondary | ICD-10-CM | POA: Diagnosis not present

## 2021-05-06 DIAGNOSIS — I129 Hypertensive chronic kidney disease with stage 1 through stage 4 chronic kidney disease, or unspecified chronic kidney disease: Secondary | ICD-10-CM | POA: Diagnosis not present

## 2021-05-06 DIAGNOSIS — E1122 Type 2 diabetes mellitus with diabetic chronic kidney disease: Secondary | ICD-10-CM | POA: Diagnosis not present

## 2021-05-07 DIAGNOSIS — N401 Enlarged prostate with lower urinary tract symptoms: Secondary | ICD-10-CM | POA: Diagnosis not present

## 2021-05-07 DIAGNOSIS — R972 Elevated prostate specific antigen [PSA]: Secondary | ICD-10-CM | POA: Diagnosis not present

## 2021-05-07 DIAGNOSIS — Z125 Encounter for screening for malignant neoplasm of prostate: Secondary | ICD-10-CM | POA: Diagnosis not present

## 2021-05-07 DIAGNOSIS — R338 Other retention of urine: Secondary | ICD-10-CM | POA: Diagnosis not present

## 2021-05-11 DIAGNOSIS — N401 Enlarged prostate with lower urinary tract symptoms: Secondary | ICD-10-CM | POA: Diagnosis not present

## 2021-05-11 DIAGNOSIS — E1122 Type 2 diabetes mellitus with diabetic chronic kidney disease: Secondary | ICD-10-CM | POA: Diagnosis not present

## 2021-05-11 DIAGNOSIS — I129 Hypertensive chronic kidney disease with stage 1 through stage 4 chronic kidney disease, or unspecified chronic kidney disease: Secondary | ICD-10-CM | POA: Diagnosis not present

## 2021-05-11 DIAGNOSIS — N138 Other obstructive and reflux uropathy: Secondary | ICD-10-CM | POA: Diagnosis not present

## 2021-05-11 DIAGNOSIS — Z466 Encounter for fitting and adjustment of urinary device: Secondary | ICD-10-CM | POA: Diagnosis not present

## 2021-05-11 DIAGNOSIS — T796XXD Traumatic ischemia of muscle, subsequent encounter: Secondary | ICD-10-CM | POA: Diagnosis not present

## 2021-05-20 DIAGNOSIS — T796XXD Traumatic ischemia of muscle, subsequent encounter: Secondary | ICD-10-CM | POA: Diagnosis not present

## 2021-05-20 DIAGNOSIS — Z466 Encounter for fitting and adjustment of urinary device: Secondary | ICD-10-CM | POA: Diagnosis not present

## 2021-05-20 DIAGNOSIS — I129 Hypertensive chronic kidney disease with stage 1 through stage 4 chronic kidney disease, or unspecified chronic kidney disease: Secondary | ICD-10-CM | POA: Diagnosis not present

## 2021-05-20 DIAGNOSIS — E1122 Type 2 diabetes mellitus with diabetic chronic kidney disease: Secondary | ICD-10-CM | POA: Diagnosis not present

## 2021-05-20 DIAGNOSIS — N401 Enlarged prostate with lower urinary tract symptoms: Secondary | ICD-10-CM | POA: Diagnosis not present

## 2021-05-20 DIAGNOSIS — N138 Other obstructive and reflux uropathy: Secondary | ICD-10-CM | POA: Diagnosis not present

## 2021-05-21 DIAGNOSIS — T796XXD Traumatic ischemia of muscle, subsequent encounter: Secondary | ICD-10-CM | POA: Diagnosis not present

## 2021-05-21 DIAGNOSIS — I129 Hypertensive chronic kidney disease with stage 1 through stage 4 chronic kidney disease, or unspecified chronic kidney disease: Secondary | ICD-10-CM | POA: Diagnosis not present

## 2021-05-21 DIAGNOSIS — N138 Other obstructive and reflux uropathy: Secondary | ICD-10-CM | POA: Diagnosis not present

## 2021-05-21 DIAGNOSIS — N401 Enlarged prostate with lower urinary tract symptoms: Secondary | ICD-10-CM | POA: Diagnosis not present

## 2021-05-21 DIAGNOSIS — Z466 Encounter for fitting and adjustment of urinary device: Secondary | ICD-10-CM | POA: Diagnosis not present

## 2021-05-21 DIAGNOSIS — E1122 Type 2 diabetes mellitus with diabetic chronic kidney disease: Secondary | ICD-10-CM | POA: Diagnosis not present

## 2021-05-31 ENCOUNTER — Other Ambulatory Visit: Payer: Self-pay | Admitting: *Deleted

## 2021-05-31 NOTE — Patient Outreach (Signed)
[facility]) Care Management ? ?05/31/2021 ? ?Dustin Duffy ?08-11-1955 ?[id number] ? ?Telephone Assessment-Unsuccessful ? ?RN attempted outreach call today however unsuccessful. RN able to leave a HIPAA approved voice message requesting a call back. ? ?Will follow up once again over the next week for ongoing [facility] services. ? ?Raina Mina, RN ?Care Management Coordinator ?[place] ?Main Office [phone]  ? ?

## 2021-06-04 ENCOUNTER — Other Ambulatory Visit: Payer: Self-pay | Admitting: *Deleted

## 2021-06-04 NOTE — Patient Outreach (Signed)
?[facility]) Care Management ?Telephonic RN Care Manager Note ? ? ?06/04/2021 ?Name:  Dustin Duffy MRN:  [id number] DOB:  24-Feb-1956 ? ?Summary: ?Recent CBG at 166 due to outings. Encouraged adherence with dietary measures and review the plan of care.  ? ?Recommendations/Changes made from today's visit: ?Stress the importance of reducing the A1C to avoid diabetic complications. Updates documented within the plan of care. Verbalized an understanding. ? ?Subjective: ?Dustin Duffy is an 66 y.o. year old male who is a primary patient of Janie Morning, DO. The care management team was consulted for assistance with care management and/or care coordination needs.   ? ?Telephonic RN Care Manager completed Telephone Visit today. ? ?Objective:  ? ?Medications Reviewed Today   ? ? Reviewed by Tobi Bastos, RN (Registered Nurse) on 03/30/21 at 1641  Med List Status: <None>  ? ?Medication Order Taking? Sig Documenting Provider Last Dose Status Informant  ?amitriptyline (ELAVIL) 50 MG tablet [id number] Yes Take 50 mg by mouth at bedtime. [provider] Taking Active Self  ?aspirin EC 81 MG tablet [id number] Yes Take 81 mg by mouth daily. Swallow whole. [provider] Taking Active Self  ?cholecalciferol (VITAMIN D3) 25 MCG (1000 UNIT) tablet [id number] Yes Take 1,000 Units by mouth 3 (three) times a week. [provider] Taking Active Self  ?gabapentin (NEURONTIN) 300 MG capsule [id number] Yes Take 300 mg by mouth 3 (three) times daily. [provider] Taking Active Self  ?glipiZIDE (GLUCOTROL) 5 MG tablet [id number]  Take 0.5 tablets (2.5 mg total) by mouth daily before breakfast. Donne Hazel, MD  Expired 03/18/21 2359   ?insulin aspart (NOVOLOG) 100 unit/mL injection [id number] No Sliding scale: ?CBG 70 - 120: 0 units, CBG 121 - 150: 1 unit, CBG 151 - 200: 2 units, CBG 201 - 250: 3 units, CBG 251 - 300: 5 units, CBG 301 - 350: 7 units, CBG 351 - 400: 9 units, CBG > 400:  call facility MD  ?Patient not taking: Reported on 03/30/2021  ? Donne Hazel, MD Not Taking Active   ?metoprolol tartrate (LOPRESSOR) 25 MG tablet [id number]  Take 1 tablet (25 mg total) by mouth 2 (two) times daily. Donne Hazel, MD  Expired 03/17/21 2359   ?simvastatin (ZOCOR) 20 MG tablet [id number] Yes Take 20 mg by mouth every evening. [provider] Taking Active Self  ?tamsulosin (FLOMAX) 0.4 MG CAPS capsule [id number] Yes Take 0.4 mg by mouth 2 (two) times daily. [provider] Taking Active Self  ? ?  ?  ? ?  ? ? ? ?SDOH:  (Social Determinants of Health) assessments and interventions performed:  ? ? ? ?Care Plan ? ?Review of patient past medical history, allergies, medications, health status, including review of consultants reports, laboratory and other test data, was performed as part of comprehensive evaluation for care management services.  ? ?Care Plan : [place] of Care  ?Updates made by Tobi Bastos, RN since 06/04/2021 12:00 AM  ?  ? ?Problem: Knowledge Deficit related to CHF and Care Coordination Needs   ?Priority: High  ?  ? ?Long-Range Goal: Development plan of care for [name]   ?Start Date: 04/02/2021  ?Expected End Date: 07/23/2021  ?This Visit's Progress: On track  ?Recent Progress: On track  ?Priority: High  ?Note:   ?Current Barriers:  ?Knowledge Deficits related to plan of care for management of [name]  ? ?RNCM Clinical Goal(s):  ?Patient will verbalize understanding of plan  for management of [name] as evidenced by lowering his A1C and CBG readouts. ?take all medications exactly as prescribed and will call provider for medication related questions as evidenced by self report, chart notification and  through collaboration with RN Care manager, provider, and care team.  ? ?Interventions: ?Inter-disciplinary care team collaboration (see longitudinal plan of care) ?Evaluation of current treatment plan related to  self management and patient's adherence  to plan as established by provider ? ? ?Diabetes Interventions:  (Status:  New goal.) Long Term Goal ?Assessed patient's understanding of A1c goal: <7% ?Provided education to patient about basic DM disease process ?Reviewed medications with patient and discussed importance of medication adherence ?Counseled on importance of regular laboratory monitoring as prescribed ?Discussed plans with patient for ongoing care management follow up and provided patient with direct contact information for care management team ?Provided patient with written educational materials related to hypo and hyperglycemia and importance of correct treatment ?Reviewed scheduled/upcoming provider appointments including: Endocrinologist  March 2023 ?Advised patient, providing education and rationale, to check cbg regularly on the secondary unit as best as the pt is able to complete this readings (blind) with limited capability until the new devices is obtained and record, calling endocrinologist for findings outside established parameters ?Screening for signs and symptoms of depression related to chronic disease state  ?Lab Results  ?Component Value Date  ? HGBA1C 12.6 (H) 02/09/2021  ?2/6 Update- Pt reports he is doing well with CBG 160-170 increased from last quarter but many changes with a new endocrinologist, changes in his dietary habits, and increase in glucose monitoring throughout the daily. Educated on the importance of reducing his A1C and checking his lower extremities for any sores or changes. Healthy choices discussed and much encouragements on food items to avoid with high protein and low carbs. Will follow up next month after visit with endocrinologist. No additional needs at this time. ? ?06/04/2021-Update-Pt reports he continues to adhere to managing hid diabetes. Reports recent outings and elevated CBG however returning to his normal with an AM read of 166 today. States his baseline has been around 110. Much improved from 2  months ago with readings around 200. Will continue to review the plan of care with reduce his over all A1C to reduce long term affect of elevated glucose levels. Next provider visit next week. Will continue to encouraged adherence to the discussed plan of care. ? ?Patient Goals/Self-Care Activities: ?Take all medications as prescribed ?Attend all scheduled provider appointments ?Call pharmacy for medication refills 3-7 days in advance of running out of medications ?Perform all self care activities independently  ?Call provider office for new concerns or questions  ?check feet daily for cuts, sores or redness ?take the blood sugar log to all doctor visits ?drink 6 to 8 glasses of water each day ?fill half of plate with vegetables ?limit fast food meals to no more than 1 per week ?manage portion size ?prepare main meal at home 3 to 5 days each week ?set a realistic goal ?wash and dry feet carefully every day ?wear comfortable, cotton socks ?wear comfortable, well-fitting shoes ? ?Follow Up Plan:  Telephone follow up appointment with care management team member scheduled for:  April 2023   ?  ? ? ?Raina Mina, RN ?Care Management Coordinator ?[place] ?Main Office [phone]  ? ? ?

## 2021-06-07 DIAGNOSIS — E1140 Type 2 diabetes mellitus with diabetic neuropathy, unspecified: Secondary | ICD-10-CM | POA: Diagnosis not present

## 2021-06-07 DIAGNOSIS — E1122 Type 2 diabetes mellitus with diabetic chronic kidney disease: Secondary | ICD-10-CM | POA: Diagnosis not present

## 2021-06-07 DIAGNOSIS — I129 Hypertensive chronic kidney disease with stage 1 through stage 4 chronic kidney disease, or unspecified chronic kidney disease: Secondary | ICD-10-CM | POA: Diagnosis not present

## 2021-06-23 DIAGNOSIS — R338 Other retention of urine: Secondary | ICD-10-CM | POA: Diagnosis not present

## 2021-07-05 ENCOUNTER — Other Ambulatory Visit: Payer: Self-pay | Admitting: *Deleted

## 2021-07-05 NOTE — Patient Outreach (Signed)
[facility]) Care Management ? ?07/05/2021 ? ?Dustin Duffy ?1955/06/06 ?[id number] ? ? ?Telephone Assessment-Unsuccessful ? ?RN attempted outreach call however  unsuccessful. RN able to leave a HIPAA approved voice message requesting a call back. ? ?Will follow up with another outreach call over the next week. ? ?Raina Mina, RN ?Care Management Coordinator ?[place] ?Main Office [phone]  ?

## 2021-07-08 ENCOUNTER — Other Ambulatory Visit: Payer: Self-pay | Admitting: *Deleted

## 2021-07-08 NOTE — Patient Outreach (Signed)
?[facility]) Care Management ?Telephonic RN Care Manager Note ? ? ?07/08/2021 ?Name:  Jaymian Bogart MRN:  [id number] DOB:  02-18-1956 ? ?Summary: ?Pt with external case management program for ongoing care management services.  ? ?Recommendations/Changes made from today's visit: ?Pt aware to reach out to his provider for any care management needs moving forward.  ? ?Subjective: ?Tyrus Wilms is an 66 y.o. year old male who is a primary patient of Janie Morning, DO. The care management team was consulted for assistance with care management and/or care coordination needs.   ? ?Telephonic RN Care Manager completed Telephone Visit today. ? ?Objective:  ? ?Medications Reviewed Today   ? ? Reviewed by Tobi Bastos, RN (Registered Nurse) on 03/30/21 at 1641  Med List Status: <None>  ? ?Medication Order Taking? Sig Documenting Provider Last Dose Status Informant  ?amitriptyline (ELAVIL) 50 MG tablet [id number] Yes Take 50 mg by mouth at bedtime. [provider] Taking Active Self  ?aspirin EC 81 MG tablet [id number] Yes Take 81 mg by mouth daily. Swallow whole. [provider] Taking Active Self  ?cholecalciferol (VITAMIN D3) 25 MCG (1000 UNIT) tablet [id number] Yes Take 1,000 Units by mouth 3 (three) times a week. [provider] Taking Active Self  ?gabapentin (NEURONTIN) 300 MG capsule [id number] Yes Take 300 mg by mouth 3 (three) times daily. [provider] Taking Active Self  ?glipiZIDE (GLUCOTROL) 5 MG tablet [id number]  Take 0.5 tablets (2.5 mg total) by mouth daily before breakfast. Donne Hazel, MD  Expired 03/18/21 2359   ?insulin aspart (NOVOLOG) 100 unit/mL injection [id number] No Sliding scale: ?CBG 70 - 120: 0 units, CBG 121 - 150: 1 unit, CBG 151 - 200: 2 units, CBG 201 - 250: 3 units, CBG 251 - 300: 5 units, CBG 301 - 350: 7 units, CBG 351 - 400: 9 units, CBG > 400: call facility MD  ?Patient not taking: Reported on 03/30/2021  ? Donne Hazel, MD Not Taking  Active   ?metoprolol tartrate (LOPRESSOR) 25 MG tablet [id number]  Take 1 tablet (25 mg total) by mouth 2 (two) times daily. Donne Hazel, MD  Expired 03/17/21 2359   ?simvastatin (ZOCOR) 20 MG tablet [id number] Yes Take 20 mg by mouth every evening. [provider] Taking Active Self  ?tamsulosin (FLOMAX) 0.4 MG CAPS capsule [id number] Yes Take 0.4 mg by mouth 2 (two) times daily. [provider] Taking Active Self  ? ?  ?  ? ?  ? ? ? ?SDOH:  (Social Determinants of Health) assessments and interventions performed:  ? ? ? ?Care Plan ? ?Review of patient past medical history, allergies, medications, health status, including review of consultants reports, laboratory and other test data, was performed as part of comprehensive evaluation for care management services.  ? ?Care Plan : [place] of Care  ?Updates made by Tobi Bastos, RN since 07/08/2021 12:00 AM  ?  ? ?Problem: Knowledge Deficit related to CHF and Care Coordination Needs   ?Priority: High  ?  ? ?Long-Range Goal: Development plan of care for [name] Completed 07/08/2021  ?Start Date: 04/02/2021  ?Expected End Date: 07/23/2021  ?Recent Progress: On track  ?Priority: High  ?Note:   ?Current Barriers:  ?Knowledge Deficits related to plan of care for management of [name]  ? ?RNCM Clinical Goal(s):  ?Patient will verbalize understanding of plan for management of [name] as evidenced by lowering his A1C and CBG readouts. ?take all medications exactly  as prescribed and will call provider for medication related questions as evidenced by self report, chart notification and  through collaboration with RN Care manager, provider, and care team.  ? ?Interventions: ?Inter-disciplinary care team collaboration (see longitudinal plan of care) ?Evaluation of current treatment plan related to  self management and patient's adherence to plan as established by provider ? ? ?Diabetes Interventions:  (Status:  New goal.) Long Term Goal ?Assessed  patient's understanding of A1c goal: <7% ?Provided education to patient about basic DM disease process ?Reviewed medications with patient and discussed importance of medication adherence ?Counseled on importance of regular laboratory monitoring as prescribed ?Discussed plans with patient for ongoing care management follow up and provided patient with direct contact information for care management team ?Provided patient with written educational materials related to hypo and hyperglycemia and importance of correct treatment ?Reviewed scheduled/upcoming provider appointments including: Endocrinologist  March 2023 ?Advised patient, providing education and rationale, to check cbg regularly on the secondary unit as best as the pt is able to complete this readings (blind) with limited capability until the new devices is obtained and record, calling endocrinologist for findings outside established parameters ?Screening for signs and symptoms of depression related to chronic disease state  ?Lab Results  ?Component Value Date  ? HGBA1C 12.6 (H) 02/09/2021  ?2/6 Update- Pt reports he is doing well with CBG 160-170 increased from last quarter but many changes with a new endocrinologist, changes in his dietary habits, and increase in glucose monitoring throughout the daily. Educated on the importance of reducing his A1C and checking his lower extremities for any sores or changes. Healthy choices discussed and much encouragements on food items to avoid with high protein and low carbs. Will follow up next month after visit with endocrinologist. No additional needs at this time. ? ?06/04/2021-Update-Pt reports he continues to adhere to managing hid diabetes. Reports recent outings and elevated CBG however returning to his normal with an AM read of 166 today. States his baseline has been around 110. Much improved from 2 months ago with readings around 200. Will continue to review the plan of care with reduce his over all A1C to  reduce long term affect of elevated glucose levels. Next provider visit next week. Will continue to encouraged adherence to the discussed plan of care. ? ?07/08/2021 Update: Pt continues to do well with no acute needs at this time. CBG continue to linger around the 140-160. Aware of external case management team will contact for ongoing case management services.  ? ?Patient Goals/Self-Care Activities: ?Take all medications as prescribed ?Attend all scheduled provider appointments ?Call pharmacy for medication refills 3-7 days in advance of running out of medications ?Perform all self care activities independently  ?Call provider office for new concerns or questions  ?check feet daily for cuts, sores or redness ?take the blood sugar log to all doctor visits ?drink 6 to 8 glasses of water each day ?fill half of plate with vegetables ?limit fast food meals to no more than 1 per week ?manage portion size ?prepare main meal at home 3 to 5 days each week ?set a realistic goal ?wash and dry feet carefully every day ?wear comfortable, cotton socks ?wear comfortable, well-fitting shoes ? ?Follow Up Plan:  Telephone follow up appointment with care management team member scheduled for:  April 2023   ? ?07/08/2021 Closure due to  external case management involvement. ?  ? ? ?Raina Mina, RN ?Care Management Coordinator ?[place] ?Main Office [phone]  ?

## 2021-07-30 DIAGNOSIS — Z23 Encounter for immunization: Secondary | ICD-10-CM | POA: Diagnosis not present

## 2021-08-11 DIAGNOSIS — R338 Other retention of urine: Secondary | ICD-10-CM | POA: Diagnosis not present

## 2021-10-07 DIAGNOSIS — E1122 Type 2 diabetes mellitus with diabetic chronic kidney disease: Secondary | ICD-10-CM | POA: Diagnosis not present

## 2021-10-14 DIAGNOSIS — I129 Hypertensive chronic kidney disease with stage 1 through stage 4 chronic kidney disease, or unspecified chronic kidney disease: Secondary | ICD-10-CM | POA: Diagnosis not present

## 2021-10-14 DIAGNOSIS — E1140 Type 2 diabetes mellitus with diabetic neuropathy, unspecified: Secondary | ICD-10-CM | POA: Diagnosis not present

## 2021-10-14 DIAGNOSIS — E1169 Type 2 diabetes mellitus with other specified complication: Secondary | ICD-10-CM | POA: Diagnosis not present

## 2021-10-14 DIAGNOSIS — E785 Hyperlipidemia, unspecified: Secondary | ICD-10-CM | POA: Diagnosis not present

## 2021-10-14 DIAGNOSIS — E1122 Type 2 diabetes mellitus with diabetic chronic kidney disease: Secondary | ICD-10-CM | POA: Diagnosis not present

## 2021-10-14 DIAGNOSIS — H548 Legal blindness, as defined in USA: Secondary | ICD-10-CM | POA: Diagnosis not present

## 2021-10-22 DIAGNOSIS — R338 Other retention of urine: Secondary | ICD-10-CM | POA: Diagnosis not present

## 2021-12-13 DIAGNOSIS — R338 Other retention of urine: Secondary | ICD-10-CM | POA: Diagnosis not present

## 2021-12-20 DIAGNOSIS — R338 Other retention of urine: Secondary | ICD-10-CM | POA: Diagnosis not present

## 2021-12-23 DIAGNOSIS — Z23 Encounter for immunization: Secondary | ICD-10-CM | POA: Diagnosis not present

## 2022-02-08 DIAGNOSIS — Z978 Presence of other specified devices: Secondary | ICD-10-CM | POA: Diagnosis not present

## 2022-02-08 DIAGNOSIS — Z79899 Other long term (current) drug therapy: Secondary | ICD-10-CM | POA: Diagnosis not present

## 2022-02-08 DIAGNOSIS — R972 Elevated prostate specific antigen [PSA]: Secondary | ICD-10-CM | POA: Diagnosis not present

## 2022-02-08 DIAGNOSIS — Z0000 Encounter for general adult medical examination without abnormal findings: Secondary | ICD-10-CM | POA: Diagnosis not present

## 2022-02-08 DIAGNOSIS — Z794 Long term (current) use of insulin: Secondary | ICD-10-CM | POA: Diagnosis not present

## 2022-02-08 DIAGNOSIS — E1122 Type 2 diabetes mellitus with diabetic chronic kidney disease: Secondary | ICD-10-CM | POA: Diagnosis not present

## 2022-02-08 DIAGNOSIS — H4720 Unspecified optic atrophy: Secondary | ICD-10-CM | POA: Diagnosis not present

## 2022-02-08 DIAGNOSIS — H6992 Unspecified Eustachian tube disorder, left ear: Secondary | ICD-10-CM | POA: Diagnosis not present

## 2022-02-08 DIAGNOSIS — N400 Enlarged prostate without lower urinary tract symptoms: Secondary | ICD-10-CM | POA: Diagnosis not present

## 2022-02-08 DIAGNOSIS — Z125 Encounter for screening for malignant neoplasm of prostate: Secondary | ICD-10-CM | POA: Diagnosis not present

## 2022-02-08 DIAGNOSIS — N401 Enlarged prostate with lower urinary tract symptoms: Secondary | ICD-10-CM | POA: Diagnosis not present

## 2022-02-10 DIAGNOSIS — R338 Other retention of urine: Secondary | ICD-10-CM | POA: Diagnosis not present

## 2022-02-21 DIAGNOSIS — E1140 Type 2 diabetes mellitus with diabetic neuropathy, unspecified: Secondary | ICD-10-CM | POA: Diagnosis not present

## 2022-02-21 DIAGNOSIS — H548 Legal blindness, as defined in USA: Secondary | ICD-10-CM | POA: Diagnosis not present

## 2022-02-21 DIAGNOSIS — E1122 Type 2 diabetes mellitus with diabetic chronic kidney disease: Secondary | ICD-10-CM | POA: Diagnosis not present

## 2022-02-21 DIAGNOSIS — E785 Hyperlipidemia, unspecified: Secondary | ICD-10-CM | POA: Diagnosis not present

## 2022-02-21 DIAGNOSIS — I129 Hypertensive chronic kidney disease with stage 1 through stage 4 chronic kidney disease, or unspecified chronic kidney disease: Secondary | ICD-10-CM | POA: Diagnosis not present

## 2022-03-31 DIAGNOSIS — R338 Other retention of urine: Secondary | ICD-10-CM | POA: Diagnosis not present

## 2022-04-28 DIAGNOSIS — R338 Other retention of urine: Secondary | ICD-10-CM | POA: Diagnosis not present

## 2022-04-28 DIAGNOSIS — N3000 Acute cystitis without hematuria: Secondary | ICD-10-CM | POA: Diagnosis not present

## 2022-04-29 ENCOUNTER — Emergency Department (HOSPITAL_COMMUNITY): Payer: Medicare Other

## 2022-04-29 ENCOUNTER — Encounter (HOSPITAL_COMMUNITY): Payer: Self-pay

## 2022-04-29 ENCOUNTER — Inpatient Hospital Stay (HOSPITAL_COMMUNITY)
Admission: EM | Admit: 2022-04-29 | Discharge: 2022-05-02 | DRG: 683 | Disposition: A | Discharge status: Home Health | Payer: Medicare Other | Attending: Family Medicine | Admitting: Family Medicine

## 2022-04-29 ENCOUNTER — Other Ambulatory Visit (HOSPITAL_COMMUNITY): Payer: Self-pay

## 2022-04-29 ENCOUNTER — Other Ambulatory Visit: Payer: Self-pay

## 2022-04-29 DIAGNOSIS — I700 Atherosclerosis of aorta: Secondary | ICD-10-CM | POA: Diagnosis present

## 2022-04-29 DIAGNOSIS — M5030 Other cervical disc degeneration, unspecified cervical region: Secondary | ICD-10-CM | POA: Diagnosis not present

## 2022-04-29 DIAGNOSIS — N390 Urinary tract infection, site not specified: Secondary | ICD-10-CM | POA: Diagnosis not present

## 2022-04-29 DIAGNOSIS — N401 Enlarged prostate with lower urinary tract symptoms: Secondary | ICD-10-CM | POA: Diagnosis present

## 2022-04-29 DIAGNOSIS — K8020 Calculus of gallbladder without cholecystitis without obstruction: Secondary | ICD-10-CM | POA: Diagnosis present

## 2022-04-29 DIAGNOSIS — M6282 Rhabdomyolysis: Secondary | ICD-10-CM | POA: Diagnosis present

## 2022-04-29 DIAGNOSIS — K828 Other specified diseases of gallbladder: Secondary | ICD-10-CM | POA: Diagnosis not present

## 2022-04-29 DIAGNOSIS — Z978 Presence of other specified devices: Secondary | ICD-10-CM | POA: Diagnosis not present

## 2022-04-29 DIAGNOSIS — D696 Thrombocytopenia, unspecified: Secondary | ICD-10-CM | POA: Diagnosis present

## 2022-04-29 DIAGNOSIS — Y846 Urinary catheterization as the cause of abnormal reaction of the patient, or of later complication, without mention of misadventure at the time of the procedure: Secondary | ICD-10-CM | POA: Diagnosis present

## 2022-04-29 DIAGNOSIS — T83518A Infection and inflammatory reaction due to other urinary catheter, initial encounter: Secondary | ICD-10-CM | POA: Diagnosis present

## 2022-04-29 DIAGNOSIS — H548 Legal blindness, as defined in USA: Secondary | ICD-10-CM | POA: Diagnosis present

## 2022-04-29 DIAGNOSIS — S0083XA Contusion of other part of head, initial encounter: Secondary | ICD-10-CM | POA: Diagnosis present

## 2022-04-29 DIAGNOSIS — S40011A Contusion of right shoulder, initial encounter: Secondary | ICD-10-CM | POA: Diagnosis present

## 2022-04-29 DIAGNOSIS — K5730 Diverticulosis of large intestine without perforation or abscess without bleeding: Secondary | ICD-10-CM | POA: Diagnosis present

## 2022-04-29 DIAGNOSIS — Z66 Do not resuscitate: Secondary | ICD-10-CM | POA: Diagnosis present

## 2022-04-29 DIAGNOSIS — N138 Other obstructive and reflux uropathy: Secondary | ICD-10-CM | POA: Diagnosis present

## 2022-04-29 DIAGNOSIS — N179 Acute kidney failure, unspecified: Secondary | ICD-10-CM | POA: Diagnosis not present

## 2022-04-29 DIAGNOSIS — Z794 Long term (current) use of insulin: Secondary | ICD-10-CM | POA: Diagnosis not present

## 2022-04-29 DIAGNOSIS — S20211A Contusion of right front wall of thorax, initial encounter: Secondary | ICD-10-CM | POA: Diagnosis present

## 2022-04-29 DIAGNOSIS — R0902 Hypoxemia: Secondary | ICD-10-CM | POA: Diagnosis present

## 2022-04-29 DIAGNOSIS — R296 Repeated falls: Secondary | ICD-10-CM

## 2022-04-29 DIAGNOSIS — M545 Low back pain, unspecified: Secondary | ICD-10-CM | POA: Diagnosis not present

## 2022-04-29 DIAGNOSIS — Z7984 Long term (current) use of oral hypoglycemic drugs: Secondary | ICD-10-CM

## 2022-04-29 DIAGNOSIS — S2242XA Multiple fractures of ribs, left side, initial encounter for closed fracture: Secondary | ICD-10-CM | POA: Diagnosis present

## 2022-04-29 DIAGNOSIS — R627 Adult failure to thrive: Secondary | ICD-10-CM | POA: Diagnosis present

## 2022-04-29 DIAGNOSIS — E871 Hypo-osmolality and hyponatremia: Secondary | ICD-10-CM | POA: Diagnosis present

## 2022-04-29 DIAGNOSIS — S3210XA Unspecified fracture of sacrum, initial encounter for closed fracture: Secondary | ICD-10-CM | POA: Diagnosis present

## 2022-04-29 DIAGNOSIS — R079 Chest pain, unspecified: Secondary | ICD-10-CM | POA: Diagnosis not present

## 2022-04-29 DIAGNOSIS — Z79899 Other long term (current) drug therapy: Secondary | ICD-10-CM

## 2022-04-29 DIAGNOSIS — Z87891 Personal history of nicotine dependence: Secondary | ICD-10-CM

## 2022-04-29 DIAGNOSIS — Y92019 Unspecified place in single-family (private) house as the place of occurrence of the external cause: Secondary | ICD-10-CM

## 2022-04-29 DIAGNOSIS — S22080A Wedge compression fracture of T11-T12 vertebra, initial encounter for closed fracture: Secondary | ICD-10-CM | POA: Diagnosis present

## 2022-04-29 DIAGNOSIS — M2578 Osteophyte, vertebrae: Secondary | ICD-10-CM | POA: Diagnosis not present

## 2022-04-29 DIAGNOSIS — Z7982 Long term (current) use of aspirin: Secondary | ICD-10-CM

## 2022-04-29 DIAGNOSIS — R7989 Other specified abnormal findings of blood chemistry: Secondary | ICD-10-CM | POA: Diagnosis present

## 2022-04-29 DIAGNOSIS — E785 Hyperlipidemia, unspecified: Secondary | ICD-10-CM | POA: Diagnosis present

## 2022-04-29 DIAGNOSIS — N3001 Acute cystitis with hematuria: Secondary | ICD-10-CM | POA: Diagnosis not present

## 2022-04-29 DIAGNOSIS — I4510 Unspecified right bundle-branch block: Secondary | ICD-10-CM | POA: Diagnosis present

## 2022-04-29 DIAGNOSIS — S3991XA Unspecified injury of abdomen, initial encounter: Secondary | ICD-10-CM | POA: Diagnosis not present

## 2022-04-29 DIAGNOSIS — W19XXXA Unspecified fall, initial encounter: Secondary | ICD-10-CM | POA: Diagnosis not present

## 2022-04-29 DIAGNOSIS — I10 Essential (primary) hypertension: Secondary | ICD-10-CM | POA: Diagnosis present

## 2022-04-29 DIAGNOSIS — E119 Type 2 diabetes mellitus without complications: Secondary | ICD-10-CM

## 2022-04-29 DIAGNOSIS — Z6820 Body mass index (BMI) 20.0-20.9, adult: Secondary | ICD-10-CM

## 2022-04-29 DIAGNOSIS — J9811 Atelectasis: Secondary | ICD-10-CM | POA: Diagnosis present

## 2022-04-29 DIAGNOSIS — R338 Other retention of urine: Secondary | ICD-10-CM | POA: Diagnosis present

## 2022-04-29 DIAGNOSIS — W1830XA Fall on same level, unspecified, initial encounter: Secondary | ICD-10-CM | POA: Diagnosis present

## 2022-04-29 DIAGNOSIS — Z9359 Other cystostomy status: Secondary | ICD-10-CM | POA: Diagnosis present

## 2022-04-29 DIAGNOSIS — R000 Tachycardia, unspecified: Secondary | ICD-10-CM | POA: Diagnosis not present

## 2022-04-29 DIAGNOSIS — Z043 Encounter for examination and observation following other accident: Secondary | ICD-10-CM | POA: Diagnosis not present

## 2022-04-29 LAB — COMPREHENSIVE METABOLIC PANEL
ALT: 73 U/L — ABNORMAL HIGH (ref 0–44)
AST: 253 U/L — ABNORMAL HIGH (ref 15–41)
Albumin: 3.9 g/dL (ref 3.5–5.0)
Alkaline Phosphatase: 73 U/L (ref 38–126)
Anion gap: 15 (ref 5–15)
BUN: 71 mg/dL — ABNORMAL HIGH (ref 8–23)
CO2: 23 mmol/L (ref 22–32)
Calcium: 8.6 mg/dL — ABNORMAL LOW (ref 8.9–10.3)
Chloride: 95 mmol/L — ABNORMAL LOW (ref 98–111)
Creatinine, Ser: 3.7 mg/dL — ABNORMAL HIGH (ref 0.61–1.24)
GFR, Estimated: 17 mL/min — ABNORMAL LOW (ref >60)
Glucose, Bld: 123 mg/dL — ABNORMAL HIGH (ref 70–99)
Potassium: 4.5 mmol/L (ref 3.5–5.1)
Sodium: 133 mmol/L — ABNORMAL LOW (ref 135–145)
Total Bilirubin: 2.2 mg/dL — ABNORMAL HIGH (ref 0.3–1.2)
Total Protein: 7.6 g/dL (ref 6.5–8.1)

## 2022-04-29 LAB — CBC WITH DIFFERENTIAL/PLATELET
Abs Immature Granulocytes: 0.16 10*3/uL — ABNORMAL HIGH (ref 0.00–0.07)
Basophils Absolute: 0 10*3/uL (ref 0.0–0.1)
Basophils Relative: 0 %
Eosinophils Absolute: 0 10*3/uL (ref 0.0–0.5)
Eosinophils Relative: 0 %
HCT: 49.8 % (ref 39.0–52.0)
Hemoglobin: 16.2 g/dL (ref 13.0–17.0)
Immature Granulocytes: 1 %
Lymphocytes Relative: 7 %
Lymphs Abs: 0.9 10*3/uL (ref 0.7–4.0)
MCH: 27.6 pg (ref 26.0–34.0)
MCHC: 32.5 g/dL (ref 30.0–36.0)
MCV: 84.8 fL (ref 80.0–100.0)
Monocytes Absolute: 0.9 10*3/uL (ref 0.1–1.0)
Monocytes Relative: 7 %
Neutro Abs: 10.5 10*3/uL — ABNORMAL HIGH (ref 1.7–7.7)
Neutrophils Relative %: 85 %
Platelets: 160 10*3/uL (ref 150–400)
RBC: 5.87 MIL/uL — ABNORMAL HIGH (ref 4.22–5.81)
RDW: 14.4 % (ref 11.5–15.5)
WBC: 12.4 10*3/uL — ABNORMAL HIGH (ref 4.0–10.5)
nRBC: 0 % (ref 0.0–0.2)

## 2022-04-29 LAB — URINALYSIS, ROUTINE W REFLEX MICROSCOPIC
Bilirubin Urine: NEGATIVE
Glucose, UA: >=500 mg/dL — AB
Ketones, ur: 20 mg/dL — AB
Nitrite: NEGATIVE
Protein, ur: 100 mg/dL — AB
Specific Gravity, Urine: 1.011 (ref 1.005–1.030)
pH: 5 (ref 5.0–8.0)

## 2022-04-29 LAB — APTT: aPTT: 34 seconds (ref 24–36)

## 2022-04-29 LAB — LACTIC ACID, PLASMA: Lactic Acid, Venous: 1.9 mmol/L (ref 0.5–1.9)

## 2022-04-29 LAB — PROTIME-INR
INR: 1.4 — ABNORMAL HIGH (ref 0.8–1.2)
Prothrombin Time: 16.6 seconds — ABNORMAL HIGH (ref 11.4–15.2)

## 2022-04-29 LAB — GLUCOSE, CAPILLARY: Glucose-Capillary: 173 mg/dL — ABNORMAL HIGH (ref 70–99)

## 2022-04-29 MED ORDER — ACETAMINOPHEN 650 MG RE SUPP: 650.0000 mg | Freq: Four times a day (QID) | RECTAL | Status: DC | PRN

## 2022-04-29 MED ORDER — HEPARIN SODIUM (PORCINE) 5000 UNIT/ML IJ SOLN
5000.0000 [IU] | Freq: Three times a day (TID) | INTRAMUSCULAR | Status: DC
  Administered 2022-04-30 – 2022-05-02 (×7): 5000 [IU] via SUBCUTANEOUS
  Filled 2022-04-29 (×7): qty 1

## 2022-04-29 MED ORDER — ONDANSETRON HCL 4 MG/2ML IJ SOLN: 4.0000 mg | Freq: Four times a day (QID) | INTRAMUSCULAR | Status: DC | PRN

## 2022-04-29 MED ORDER — AMITRIPTYLINE HCL 50 MG PO TABS
50.0000 mg | ORAL_TABLET | Freq: Every day | ORAL | Status: DC
  Administered 2022-04-29 – 2022-05-01 (×3): 50 mg via ORAL
  Filled 2022-04-29 (×3): qty 1

## 2022-04-29 MED ORDER — SODIUM CHLORIDE 0.9 % IV SOLN
1.0000 g | INTRAVENOUS | Status: DC
  Administered 2022-04-30 – 2022-05-01 (×3): 1 g via INTRAVENOUS
  Filled 2022-04-29 (×3): qty 10

## 2022-04-29 MED ORDER — SODIUM CHLORIDE 0.9 % IV SOLN: INTRAVENOUS | Status: AC

## 2022-04-29 MED ORDER — METOPROLOL TARTRATE 25 MG PO TABS
25.0000 mg | ORAL_TABLET | Freq: Two times a day (BID) | ORAL | Status: DC
  Administered 2022-04-30 – 2022-05-02 (×5): 25 mg via ORAL
  Filled 2022-04-29 (×5): qty 1

## 2022-04-29 MED ORDER — INSULIN ASPART 100 UNIT/ML IJ SOLN
0.0000 [IU] | Freq: Three times a day (TID) | INTRAMUSCULAR | Status: DC
  Administered 2022-04-30 – 2022-05-01 (×2): 2 [IU] via SUBCUTANEOUS
  Administered 2022-05-01: 5 [IU] via SUBCUTANEOUS
  Administered 2022-05-02: 1 [IU] via SUBCUTANEOUS

## 2022-04-29 MED ORDER — OXYCODONE HCL 5 MG PO TABS
5.0000 mg | ORAL_TABLET | ORAL | Status: DC | PRN
  Administered 2022-04-30 – 2022-05-01 (×4): 5 mg via ORAL
  Filled 2022-04-29 (×4): qty 1

## 2022-04-29 MED ORDER — SODIUM CHLORIDE 0.9 % IV BOLUS
1000.0000 mL | Freq: Once | INTRAVENOUS | Status: AC
  Administered 2022-04-29: 1000 mL via INTRAVENOUS

## 2022-04-29 MED ORDER — LACTATED RINGERS IV BOLUS
1000.0000 mL | Freq: Once | INTRAVENOUS | Status: AC
  Administered 2022-04-29: 1000 mL via INTRAVENOUS

## 2022-04-29 MED ORDER — BISACODYL 5 MG PO TBEC: 5.0000 mg | DELAYED_RELEASE_TABLET | Freq: Every day | ORAL | Status: DC | PRN

## 2022-04-29 MED ORDER — SENNOSIDES-DOCUSATE SODIUM 8.6-50 MG PO TABS: 1.0000 | ORAL_TABLET | Freq: Every evening | ORAL | Status: DC | PRN

## 2022-04-29 MED ORDER — GABAPENTIN 100 MG PO CAPS
300.0000 mg | ORAL_CAPSULE | Freq: Three times a day (TID) | ORAL | Status: DC
  Administered 2022-04-30 – 2022-05-02 (×7): 300 mg via ORAL
  Filled 2022-04-29 (×6): qty 3

## 2022-04-29 MED ORDER — TAMSULOSIN HCL 0.4 MG PO CAPS
0.4000 mg | ORAL_CAPSULE | Freq: Two times a day (BID) | ORAL | Status: DC
  Administered 2022-04-29 – 2022-05-02 (×6): 0.4 mg via ORAL
  Filled 2022-04-29 (×6): qty 1

## 2022-04-29 MED ORDER — ACETAMINOPHEN 325 MG PO TABS
650.0000 mg | ORAL_TABLET | Freq: Four times a day (QID) | ORAL | Status: DC | PRN
  Administered 2022-04-30 (×2): 650 mg via ORAL
  Filled 2022-04-29 (×2): qty 2

## 2022-04-29 MED ORDER — ONDANSETRON HCL 4 MG PO TABS: 4.0000 mg | ORAL_TABLET | Freq: Four times a day (QID) | ORAL | Status: DC | PRN

## 2022-04-29 NOTE — ED Notes (Signed)
Patient transported to CT 

## 2022-04-29 NOTE — ED Provider Notes (Signed)
[facility] Provider Note   CSN: [id number] Arrival date & time: 04/29/22  1224     History  Chief Complaint  Patient presents with   Fall   Dizziness    Dustin Duffy is a 67 y.o. male.  67 yo M with a chief complaint of a fall.  The patient tells me that he has had multiple falls here recently.  He was diagnosed with a urinary tract infection and has been on antibiotics.  He does feel generally weak.  He denies any obvious injury in the fall.  Complaining mostly of fatigue.   Fall  Dizziness      Home Medications Prior to Admission medications   Medication Sig Start Date End Date Taking? Authorizing Provider  amitriptyline (ELAVIL) 50 MG tablet Take 50 mg by mouth at bedtime. 01/06/21   [provider]  aspirin EC 81 MG tablet Take 81 mg by mouth daily. Swallow whole.    [provider]  cholecalciferol (VITAMIN D3) 25 MCG (1000 UNIT) tablet Take 1,000 Units by mouth 3 (three) times a week.    [provider]  Dapagliflozin-metFORMIN HCl ER (XIGDUO XR) 10-500 MG TB24 Take 10 capsules by mouth daily.    [provider]  gabapentin (NEURONTIN) 300 MG capsule Take 300 mg by mouth 3 (three) times daily. 02/05/21   [provider]  glipiZIDE (GLUCOTROL) 5 MG tablet Take 0.5 tablets (2.5 mg total) by mouth daily before breakfast. 02/16/21 03/18/21  Donne Hazel, MD  insulin aspart (NOVOLOG) 100 unit/mL injection Sliding scale: CBG 70 - 120: 0 units, CBG 121 - 150: 1 unit, CBG 151 - 200: 2 units, CBG 201 - 250: 3 units, CBG 251 - 300: 5 units, CBG 301 - 350: 7 units, CBG 351 - 400: 9 units, CBG > 400: call facility MD Patient not taking: Reported on 03/30/2021 02/15/21   Donne Hazel, MD  metoprolol tartrate (LOPRESSOR) 25 MG tablet Take 1 tablet (25 mg total) by mouth 2 (two) times daily. 02/15/21 03/17/21  Donne Hazel, MD  simvastatin (ZOCOR) 20 MG tablet Take 20 mg by mouth every  evening. 01/13/21   [provider]  tamsulosin (FLOMAX) 0.4 MG CAPS capsule Take 0.4 mg by mouth 2 (two) times daily. 01/06/21   [provider]      Allergies    Patient has no known allergies.    Review of Systems   Review of Systems  Neurological:  Positive for dizziness.    Physical Exam Updated Vital Signs BP 105/74   Pulse (!) 107   Temp 97.8 F (36.6 C) (Oral)   Resp 18   SpO2 92%  Physical Exam Vitals and nursing note reviewed.  Constitutional:      Appearance: He is well-developed.  HENT:     Head: Normocephalic.     Comments: Multiple signs of repetitive injury to the head. Eyes:     Pupils: Pupils are equal, round, and reactive to light.  Neck:     Vascular: No JVD.  Cardiovascular:     Rate and Rhythm: Normal rate and regular rhythm.     Heart sounds: No murmur heard.    No friction rub. No gallop.  Pulmonary:     Effort: No respiratory distress.     Breath sounds: No wheezing.  Abdominal:     General: There is no distension.     Tenderness: There is no abdominal tenderness. There is no  guarding or rebound.  Musculoskeletal:        General: Normal range of motion.     Cervical back: Normal range of motion and neck supple.     Comments: Scattered bruises throughout the bony prominences.  No obvious bony tenderness to palpation.  Palpated from head to toe without any obvious area of significant discomfort.  Skin:    Coloration: Skin is not pale.     Findings: No rash.  Neurological:     Mental Status: He is alert and oriented to person, place, and time.  Psychiatric:        Behavior: Behavior normal.     ED Results / Procedures / Treatments   Labs (all labs ordered are listed, but only abnormal results are displayed) Labs Reviewed  COMPREHENSIVE METABOLIC PANEL - Abnormal; Notable for the following components:      Result Value   Sodium 133 (*)    Chloride 95 (*)    Glucose, Bld 123 (*)    BUN 71 (*)    Creatinine, Ser 3.70  (*)    Calcium 8.6 (*)    AST 253 (*)    ALT 73 (*)    Total Bilirubin 2.2 (*)    GFR, Estimated 17 (*)    All other components within normal limits  CBC WITH DIFFERENTIAL/PLATELET - Abnormal; Notable for the following components:   WBC 12.4 (*)    RBC 5.87 (*)    Neutro Abs 10.5 (*)    Abs Immature Granulocytes 0.16 (*)    All other components within normal limits  PROTIME-INR - Abnormal; Notable for the following components:   Prothrombin Time 16.6 (*)    INR 1.4 (*)    All other components within normal limits  CULTURE, BLOOD (ROUTINE X 2)  CULTURE, BLOOD (ROUTINE X 2)  LACTIC ACID, PLASMA  APTT  LACTIC ACID, PLASMA  URINALYSIS, ROUTINE W REFLEX MICROSCOPIC    EKG None  Radiology DG Lumbar Spine Complete  Result Date: 04/29/2022 CLINICAL DATA:  Fall last night.  Pain.  Questionable sepsis. EXAM: LUMBAR SPINE - COMPLETE 4+ VIEW COMPARISON:  None Available. FINDINGS: There is diffuse decreased bone mineralization. There is a minimally displaced acute posterior left twelfth rib fracture with approximately 2 mm cortical step-off. There are 5 non-rib-bearing lumbar-type vertebral bodies. There is moderate to severe mid and anterior T12 vertebral body height loss with approximately 5 mm retropulsion of the posterosuperior T12 vertebral body. Mild posterior L5-S1 disc space narrowing. IMPRESSION: 1. Minimally displaced acute posterior left twelfth rib fracture. 2. Moderate to severe mid and anterior T12 vertebral body height loss with approximately 5 mm retropulsion of the posterosuperior T12 vertebral body. This is age indeterminate, and recommend correlation for point tenderness. There may be an acute fracture lucency at the superior aspect of the T12 vertebral body, and the acute fracture of the posterior left twelfth rib raises suspicion for an acute compression fracture component given this is at the same level. Electronically Signed   By: Yvonne Kendall M.D.   On: 04/29/2022 14:13    DG Chest 1 View  Result Date: 04/29/2022 CLINICAL DATA:  Fall last night.  Pain. EXAM: CHEST  1 VIEW COMPARISON:  AP chest 02/11/2021 FINDINGS: Cardiac silhouette and mediastinal contours are within normal limits. Mild calcification within the aortic arch. There is improved aeration of the inferior right lung with resolution of prior linear atelectasis. Left basilar curvilinear scarring appears unchanged from 02/11/2021. No definite acute airspace opacity is seen.  No definite pleural effusion is seen. No pneumothorax. There is a minimally displaced acute posterior left twelfth rib fracture, better seen on contemporaneous lumbar spine radiographs. IMPRESSION: 1. Improved aeration of the inferior right lung with resolution of prior linear atelectasis. 2. No definite acute lung process. 3. Minimally displaced acute posterior left twelfth rib fracture, better seen on contemporaneous lumbar spine radiographs. Electronically Signed   By: Yvonne Kendall M.D.   On: 04/29/2022 14:10   CT Head Wo Contrast  Result Date: 04/29/2022 CLINICAL DATA:  Multiple falls EXAM: CT HEAD WITHOUT CONTRAST CT CERVICAL SPINE WITHOUT CONTRAST TECHNIQUE: Multidetector CT imaging of the head and cervical spine was performed following the standard protocol without intravenous contrast. Multiplanar CT image reconstructions of the cervical spine were also generated. RADIATION DOSE REDUCTION: This exam was performed according to the departmental dose-optimization program which includes automated exposure control, adjustment of the mA and/or kV according to patient size and/or use of iterative reconstruction technique. COMPARISON:  02/09/2021 FINDINGS: CT HEAD FINDINGS Brain: No evidence of acute infarction, hemorrhage, hydrocephalus, extra-axial collection or mass lesion/mass effect. Vascular: No hyperdense vessel or unexpected calcification. Skull: Normal. Negative for fracture or focal lesion. Sinuses/Orbits: No acute finding. Other: None.  CT CERVICAL SPINE FINDINGS Alignment: Degenerative straightening of the normal cervical lordosis. Skull base and vertebrae: No acute fracture. No primary bone lesion or focal pathologic process. Soft tissues and spinal canal: No prevertebral fluid or swelling. No visible canal hematoma. Disc levels: Severe multilevel disc space height loss and osteophytosis, worst from C4-C6. Upper chest: Negative. Other: None. IMPRESSION: 1. No acute intracranial pathology. 2. No fracture or static subluxation of the cervical spine. 3. Severe multilevel cervical disc degenerative disease. Electronically Signed   By: Delanna Ahmadi M.D.   On: 04/29/2022 13:41   CT Cervical Spine Wo Contrast  Result Date: 04/29/2022 CLINICAL DATA:  Multiple falls EXAM: CT HEAD WITHOUT CONTRAST CT CERVICAL SPINE WITHOUT CONTRAST TECHNIQUE: Multidetector CT imaging of the head and cervical spine was performed following the standard protocol without intravenous contrast. Multiplanar CT image reconstructions of the cervical spine were also generated. RADIATION DOSE REDUCTION: This exam was performed according to the departmental dose-optimization program which includes automated exposure control, adjustment of the mA and/or kV according to patient size and/or use of iterative reconstruction technique. COMPARISON:  02/09/2021 FINDINGS: CT HEAD FINDINGS Brain: No evidence of acute infarction, hemorrhage, hydrocephalus, extra-axial collection or mass lesion/mass effect. Vascular: No hyperdense vessel or unexpected calcification. Skull: Normal. Negative for fracture or focal lesion. Sinuses/Orbits: No acute finding. Other: None. CT CERVICAL SPINE FINDINGS Alignment: Degenerative straightening of the normal cervical lordosis. Skull base and vertebrae: No acute fracture. No primary bone lesion or focal pathologic process. Soft tissues and spinal canal: No prevertebral fluid or swelling. No visible canal hematoma. Disc levels: Severe multilevel disc space  height loss and osteophytosis, worst from C4-C6. Upper chest: Negative. Other: None. IMPRESSION: 1. No acute intracranial pathology. 2. No fracture or static subluxation of the cervical spine. 3. Severe multilevel cervical disc degenerative disease. Electronically Signed   By: Delanna Ahmadi M.D.   On: 04/29/2022 13:41    Procedures .Critical Care  Performed by: Deno Etienne, DO Authorized by: Deno Etienne, DO   Critical care provider statement:    Critical care time (minutes):  35   Critical care time was exclusive of:  Separately billable procedures and treating other patients   Critical care was time spent personally by me on the following activities:  Development of treatment  plan with patient or surrogate, discussions with consultants, evaluation of patient's response to treatment, examination of patient, ordering and review of laboratory studies, ordering and review of radiographic studies, ordering and performing treatments and interventions, pulse oximetry, re-evaluation of patient's condition and review of old charts   Care discussed with: admitting provider       Medications Ordered in ED Medications  sodium chloride 0.9 % bolus 1,000 mL (1,000 mLs Intravenous New Bag/Given 04/29/22 1421)    ED Course/ Medical Decision Making/ A&P                             Medical Decision Making Amount and/or Complexity of Data Reviewed Labs: ordered. Radiology: ordered. ECG/medicine tests: ordered.   67 yo M with frequent falls over the past few days.  Was recently diagnosed with a urinary tract infection and was started on antibiotics.  No fevers no flank pain.  Patient with AKI, failure to thrive.  Likely needs admission.  With 2 concerning fractures on plain films will obtain CT scan of the chest and pelvis.  Patient care was signed out to Dr. Kathrynn Humble, please see his note for further details care in ED.  The patients results and plan were reviewed and discussed.   Any x-rays performed  were independently reviewed by myself.   Differential diagnosis were considered with the presenting HPI.  Medications  sodium chloride 0.9 % bolus 1,000 mL (1,000 mLs Intravenous New Bag/Given 04/29/22 1421)    Vitals:   04/29/22 1246 04/29/22 1400 04/29/22 1415  BP: 99/67 102/83 105/74  Pulse: 93 (!) 102 (!) 107  Resp: 18  18  Temp: 97.8 F (36.6 C)    TempSrc: Oral    SpO2: 95% 94% 92%    Final diagnoses:  AKI (acute kidney injury) ([place])           Final Clinical Impression(s) / ED Diagnoses Final diagnoses:  AKI (acute kidney injury) [facility])    Rx / [place] Orders ED Discharge Orders     None         Deno Etienne, DO 04/29/22 1609

## 2022-04-29 NOTE — ED Provider Notes (Addendum)
  Physical Exam  BP 105/74   Pulse (!) 107   Temp 97.8 F (36.6 C) (Oral)   Resp 18   SpO2 92%   Physical Exam  Procedures  Procedures  ED Course / MDM    Medical Decision Making Amount and/or Complexity of Data Reviewed Labs: ordered. Radiology: ordered. ECG/medicine tests: ordered.  Risk Decision regarding hospitalization.   Pt comes in with cc of falls. Multiple falls recently.  CT blunt ordered and pending. Head and Cspine reassuring.  Will need admission for uti, falls, weakness.   Varney Biles, MD 04/29/22 1600   9:20 PM Patient's CT results indicated sacral fractures, T12 fracture, 2 rib fractures. There is also evidence of cholelithiasis.  Patient's labs reveal elevated LFTs and slightly elevated bilirubin.   Patient denies any abdominal pain.  On exam, he does have right upper quadrant and epigastric tenderness, but there is no Murphy's. Given the weighted LFTs, I will order ultrasound but he is stable for admission at this time for AKI, rib fractures and clinically insignificant spine fractures.    The patient appears reasonably screened and/or stabilized for discharge and I doubt any other medical condition or other [facility] requiring further screening, evaluation, or treatment in the ED at this time prior to discharge.   Results from the ER workup discussed with the patient face to face and all questions answered to the best of my ability. The patient is safe for discharge with strict return precautions.   Varney Biles, MD 04/29/22 2121    Varney Biles, MD 04/29/22 2121

## 2022-04-29 NOTE — H&P (Signed)
History and Physical    Dustin Duffy MLY:[id number] DOB: 10/12/1955 DOA: 04/29/2022  PCP: Janie Morning, DO  Patient coming from: Home  I have personally briefly reviewed patient's old medical records in [place]  Chief Complaint: Frequent falls  HPI: Dustin Duffy is a 67 y.o. male with medical history significant for T2DM, HTN, HLD, BPH with urinary retention and chronic indwelling Foley catheter, legally blind who presented to the ED for evaluation of generalized weakness and frequent falls.  Patient states that he has been falling frequently at home when ambulating.  He says his legs feel like they just give out which causes him to fall.  He says he will use a cane or walker when outside the house but not inside.  He has had a lot of bruising related to these falls but denies any significant injury although has been having some lower back pain.  He has had trouble standing up after some of these falls and states that he lays on the ground for a few hours at a time.  He has been having issues with decreased urine output recently.  He was seen by [facility] on 2/1 and states that his Foley was exchanged with significant increase in urine output.  He was also diagnosed with UTI and prescribed a 1 week course of antibiotics with Bactrim, he took 1 dose so far.  He denies any chest pain, palpitations, dyspnea, nausea, vomiting.  Denies any abdominal pain but does have tenderness with palpation in the right upper quadrant.  ED Course  Labs/Imaging on admission: I have personally reviewed following labs and imaging studies.  Initial vitals showed BP 99/67, pulse 93, RR 18, temp 97.8 F, SpO2 95% on room air.  Labs showed WBC 12.4, hemoglobin 16.2, platelets 160,000, sodium 133, potassium 4.5, bicarb 23, BUN 71, creatinine 3.70 (previously 1.06 02/15/2021), serum glucose 123, AST 253, ALT 73, alk phos 73, total bilirubin 2.2, lactic acid 1.9.  Urinalysis shows negative nitrites,  small leukocytes, 11-20 RBCs, 21-50 WBCs, many bacteria on microscopy.  Blood cultures in process.  CT head negative for acute intracranial pathology.  CT cervical spine negative for fracture or subluxation.  Severe multilevel DDD noted.  CT abdomen/pelvis and CT L-spine showed minimally displaced sacral fractures at S3 and S4 favored to be acute.  Mildly displaced fractures of posterior left 11th and 12th ribs noted.  T12 wedge compression fracture with 75% height loss favored to be chronic.  Distended gallbladder without inflammatory change or cholelithiasis noted.  Sigmoid diverticulosis without acute diverticulitis changes also reported.  Patient was given 1 L LR, 1 L normal saline.  The hospitalist service was consulted to admit for further evaluation and management.  Review of Systems: All systems reviewed and are negative except as documented in history of present illness above.   Past Medical History:  Diagnosis Date   BPH without urinary obstruction    Diabetes mellitus type 2 in nonobese (HCC)    Dyslipidemia    Essential hypertension    Renal impairment    Visual impairment     History reviewed. No pertinent surgical history.  Social History:  reports that he quit smoking about 12 years ago. His smoking use included cigarettes. He has a 35.00 pack-year smoking history. He has never used smokeless tobacco. He reports that he does not currently use drugs. No history on file for alcohol use.  No Known Allergies  History reviewed. No pertinent family history.   Prior to Admission medications  Medication Sig Start Date End Date Taking? Authorizing Provider  amitriptyline (ELAVIL) 50 MG tablet Take 50 mg by mouth at bedtime. 01/06/21  Yes [provider]  aspirin EC 81 MG tablet Take 81 mg by mouth in the morning. Swallow whole.   Yes [provider]  cholecalciferol (VITAMIN D3) 25 MCG (1000 UNIT) tablet Take 1,000 Units by mouth 3 (three) times a week.    Yes [provider]  gabapentin (NEURONTIN) 300 MG capsule Take 300 mg by mouth 3 (three) times daily. 02/05/21  Yes [provider]  glipiZIDE (GLUCOTROL XL) 2.5 MG 24 hr tablet Take 2.5 mg by mouth daily with breakfast.   Yes [provider]  ipratropium (ATROVENT) 0.03 % nasal spray Place 2 sprays into both nostrils 2 (two) times daily as needed for rhinitis.   Yes [provider]  metoprolol tartrate (LOPRESSOR) 25 MG tablet Take 1 tablet (25 mg total) by mouth 2 (two) times daily. 02/15/21 04/29/22 Yes Donne Hazel, MD  RYBELSUS 14 MG TABS Take 14 mg by mouth daily before breakfast.   Yes [provider]  simvastatin (ZOCOR) 20 MG tablet Take 20 mg by mouth every evening. 01/13/21  Yes [provider]  sulfamethoxazole-trimethoprim (BACTRIM DS) 800-160 MG tablet Take 1 tablet by mouth 2 (two) times daily. 04/28/22 05/05/22 Yes [provider]  tamsulosin (FLOMAX) 0.4 MG CAPS capsule Take 0.4 mg by mouth 2 (two) times daily. 01/06/21  Yes [provider]  XIGDUO XR 10-500 MG TB24 Take 1 tablet by mouth daily before breakfast.   Yes [provider]  glipiZIDE (GLUCOTROL) 5 MG tablet Take 0.5 tablets (2.5 mg total) by mouth daily before breakfast. Patient not taking: Reported on 04/29/2022 02/16/21 04/29/22  Donne Hazel, MD  insulin aspart (NOVOLOG) 100 unit/mL injection Sliding scale: CBG 70 - 120: 0 units, CBG 121 - 150: 1 unit, CBG 151 - 200: 2 units, CBG 201 - 250: 3 units, CBG 251 - 300: 5 units, CBG 301 - 350: 7 units, CBG 351 - 400: 9 units, CBG > 400: call facility MD Patient not taking: Reported on 04/29/2022 02/15/21   Donne Hazel, MD    Physical Exam: Vitals:   04/29/22 2056 04/29/22 2100 04/29/22 2130 04/29/22 2246  BP:  130/80 127/78 128/78  Pulse:  (!) 111 (!) 111 (!) 109  Resp:  (!) '21 20 18  '$ Temp: 98.6 F (37 C)   99.4 F (37.4 C)  TempSrc: Oral   Oral  SpO2:  93% 97% 94%  Weight:       Height:       Constitutional: Chronically ill-appearing man resting supine in bed.  NAD, calm, comfortable Eyes: EOMI, lids and conjunctivae normal ENMT: Mucous membranes are dry. Posterior pharynx clear of any exudate or lesions.poor dentition.  Neck: normal, supple, no masses. Respiratory: clear to auscultation bilaterally, no wheezing, no crackles. Normal respiratory effort. No accessory muscle use.  Cardiovascular: Regular rate and rhythm, no murmurs / rubs / gallops. No extremity edema. 2+ pedal pulses. Abdomen: Mild RUQ tenderness, no masses palpated. GU: Foley catheter in place with leg bag, clear yellow urine output Musculoskeletal: no clubbing / cyanosis. No joint deformity upper and lower extremities. Good ROM. Skin: Numerous bruises in various stages of healing throughout all extremities as well as top of his head Neurologic: Sensation intact. Strength 5/5 in all 4.  Psychiatric: Alert and oriented x 3. Normal mood.   EKG: Personally reviewed. Sinus tachycardia, rate  104, RBBB.  Similar to prior.  Assessment/Plan Principal Problem:   Acute kidney injury ([place]) Active Problems:   Diabetes mellitus type 2 in nonobese (HCC)   Dyslipidemia   Essential hypertension   BPH with urinary obstruction   Foley catheter in place prior to arrival   Frequent falls   Multiple fractures of ribs, left side, initial encounter for closed fracture   Sacral fracture, closed ([place])   Closed wedge compression fracture of T12 vertebra ([place])   UTI (urinary tract infection)   Olivier Frayre is a 67 y.o. male with medical history significant for T2DM, HTN, HLD, BPH with urinary retention and chronic indwelling Foley catheter, legally blind who is admitted with acute kidney injury.  Assessment and Plan: Acute kidney injury: Suspecting he may have been experiencing urinary obstruction prior to exchange of his Foley which may have caused his AKI.  He is having good urine output now.  CT without evidence  of ureteral obstruction or hydronephrosis.  Rhabdomyolysis also considered given frequent falls and time down. -Continue IV fluid hydration overnight -Monitor urine output -Check CK level -Discontinue Bactrim, hold Xigduo  UTI in setting of BPH with chronic indwelling Foley catheter: Diagnosed with UTI by his urologist on 2/1 and started on course of Bactrim which is held due to AKI.  Foley exchanged on 2/1. -Start IV ceftriaxone -Obtain urine culture -Continue Flomax and Foley care  Elevated LFTs: AST 253, ALT 73, T. bili 2.2 on admission.  Has mild RUQ tenderness on exam.  CT and RUQ ultrasound shows distended gallbladder with sludge and no evidence of acute cholecystitis.  [place] does show nonspecific increased echogenicity of the portal triads which may be seen in acute hepatitis. -Check acute hepatitis panel -Repeat CMP in a.m. -Hold statin  Hypoxia Acute mildly displaced posterior left 11th and 12th rib fractures: SpO2 dropped to 88% while in the ED, stable on 2 L O2 via [place].  He denies any shortness of breath.  Suspect diminished inspiration in setting of acute rib fractures.  CXR otherwise without infiltrate or consolidation. -Continue supplemental O2 as needed -Incentive spirometer -Analgesics as needed  Frequent falls with injury Acute minimally displaced S3 and S4 sacral fractures T12 wedge compression fracture, chronic: Numerous falls at home.  No focal deficits.  Found to have acute sacral fractures and left 11th and 12th rib fractures as above otherwise no other significant injury. -PT/OT eval -Continue fall precautions -Pain management as needed  Type 2 diabetes: Hold home meds and placed on SSI.  Hypertension: Continue Lopressor.  Hyperlipidemia: Holding statin with elevated LFTs.   DVT prophylaxis: heparin injection 5,000 Units Start: 04/30/22 0600 Code Status: DNR, confirmed with patient on admission Family Communication: Discussed with patient, he has  discussed with family Disposition Plan: From home, dispo pending clinical progress Consults called: None Severity of Illness: The appropriate patient status for this patient is OBSERVATION. Observation status is judged to be reasonable and necessary in order to provide the required intensity of service to ensure the patient's safety. The patient's presenting symptoms, physical exam findings, and initial radiographic and laboratory data in the context of their medical condition is felt to place them at decreased risk for further clinical deterioration. Furthermore, it is anticipated that the patient will be medically stable for discharge from the hospital within 2 midnights of admission.   Zada Finders MD [facility]  If 7PM-7AM, please contact night-coverage [email or web address]  04/29/2022, 11:29 PM

## 2022-04-29 NOTE — ED Notes (Signed)
Pt. Finally agreed to wear 2L of oxygen.

## 2022-04-29 NOTE — Hospital Course (Signed)
Dustin Duffy is a 67 y.o. male with medical history significant for T2DM, HTN, HLD, BPH with urinary retention and chronic indwelling Foley catheter, legally blind who is admitted with acute kidney injury.

## 2022-04-29 NOTE — ED Triage Notes (Signed)
Pt BIBA from home for fall last night, legs gave out and he fell around 7p, was able to get back up around 3a, went to bed. Friends came over d/t pt not answering phone and had him come to hospital. Denies OC but uncertain if he hit head or what else. Has numerous old bruises from previous falls. Dx with UTI yesterday and foley changed out. Took 1 dose of abx before falling. Chronic R hip pain. Denies CP, SHOB.  108/68 HR 102 RR 16 94% RA CBG 15

## 2022-04-29 NOTE — ED Notes (Signed)
ED TO INPATIENT HANDOFF REPORT  ED Nurse Name and Phone #: Alroy Bailiff Name/Age/Gender Dustin Duffy 67 y.o. male Room/Bed: WA11/WA11  Code Status   Code Status: Prior  Home/SNF/Other Home Patient oriented to: self, place, and time Is this baseline? Yes   Triage Complete: Triage complete  Chief Complaint Acute kidney injury [facility]) [N17.9]  Triage Note Pt BIBA from home for fall last night, legs gave out and he fell around 7p, was able to get back up around 3a, went to bed. Friends came over d/t pt not answering phone and had him come to hospital. Denies OC but uncertain if he hit head or what else. Has numerous old bruises from previous falls. Dx with UTI yesterday and foley changed out. Took 1 dose of abx before falling. Chronic R hip pain. Denies CP, SHOB.  108/68 HR 102 RR 16 94% RA CBG 15   Allergies No Known Allergies  Level of Care/Admitting Diagnosis ED Disposition     ED Disposition  Admit   Condition  --   Comment  Hospital Area: [place] [[id number]]  Level of Care: Med-Surg [16]  May place patient in observation at [facility] or [place] if equivalent level of care is available:: No  Covid Evaluation: Asymptomatic - no recent exposure (last 10 days) testing not required  Diagnosis: Acute kidney injury [facility]) [[id number]]  Admitting Physician: Lenore Cordia [[id number]]  Attending Physician: Lenore Cordia [[id number]]          B Medical/Surgery History Past Medical History:  Diagnosis Date   BPH without urinary obstruction    Diabetes mellitus type 2 in nonobese ([place])    Dyslipidemia    Essential hypertension    Renal impairment    Visual impairment    History reviewed. No pertinent surgical history.   A IV Location/Drains/Wounds Patient Lines/Drains/Airways Status     Active Line/Drains/Airways     Name Placement date Placement time Site Days   Peripheral IV 04/29/22 20 G Left Antecubital 04/29/22  1417  Antecubital   less than 1   Peripheral IV 04/29/22 20 G 1" Right Antecubital 04/29/22  1420  Antecubital  less than 1   Urethral Catheter Driss Zine, RN Straight-tip 16 Fr. 02/12/21  0530  Straight-tip  441            Intake/Output Last 24 hours  Intake/Output Summary (Last 24 hours) at 04/29/2022 2217 Last data filed at 04/29/2022 1621 Gross per 24 hour  Intake 999 ml  Output --  Net 999 ml    Labs/Imaging Results for orders placed or performed during the hospital encounter of 04/29/22 (from the past 48 hour(s))  Lactic acid, plasma     Status: None   Collection Time: 04/29/22  2:01 PM  Result Value Ref Range   Lactic Acid, Venous 1.9 0.5 - 1.9 mmol/L    Comment: Performed at [facility], [place] [address] [place], [place] [address]  Comprehensive metabolic panel     Status: Abnormal   Collection Time: 04/29/22  2:13 PM  Result Value Ref Range   Sodium 133 (L) 135 - 145 mmol/L   Potassium 4.5 3.5 - 5.1 mmol/L   Chloride 95 (L) 98 - 111 mmol/L   CO2 23 22 - 32 mmol/L   Glucose, Bld 123 (H) 70 - 99 mg/dL    Comment: Glucose reference range applies only to samples taken after fasting for at least 8 hours.   BUN 71 (  H) 8 - 23 mg/dL   Creatinine, Ser 3.70 (H) 0.61 - 1.24 mg/dL   Calcium 8.6 (L) 8.9 - 10.3 mg/dL   Total Protein 7.6 6.5 - 8.1 g/dL   Albumin 3.9 3.5 - 5.0 g/dL   AST 253 (H) 15 - 41 U/L   ALT 73 (H) 0 - 44 U/L   Alkaline Phosphatase 73 38 - 126 U/L   Total Bilirubin 2.2 (H) 0.3 - 1.2 mg/dL   GFR, Estimated 17 (L) >60 mL/min    Comment: (NOTE) Calculated using the CKD-EPI Creatinine Equation (2021)    Anion gap 15 5 - 15    Comment: Performed at [facility], [place] [address] [place], [place] [address]  CBC with Differential     Status: Abnormal   Collection Time: 04/29/22  2:13 PM  Result Value Ref Range   WBC 12.4 (H) 4.0 - 10.5 K/uL   RBC 5.87 (H) 4.22 - 5.81 MIL/uL   Hemoglobin 16.2 13.0 - 17.0 g/dL   HCT 49.8 39.0 - 52.0 %   MCV  84.8 80.0 - 100.0 fL   MCH 27.6 26.0 - 34.0 pg   MCHC 32.5 30.0 - 36.0 g/dL   RDW 14.4 11.5 - 15.5 %   Platelets 160 150 - 400 K/uL   nRBC 0.0 0.0 - 0.2 %   Neutrophils Relative % 85 %   Neutro Abs 10.5 (H) 1.7 - 7.7 K/uL   Lymphocytes Relative 7 %   Lymphs Abs 0.9 0.7 - 4.0 K/uL   Monocytes Relative 7 %   Monocytes Absolute 0.9 0.1 - 1.0 K/uL   Eosinophils Relative 0 %   Eosinophils Absolute 0.0 0.0 - 0.5 K/uL   Basophils Relative 0 %   Basophils Absolute 0.0 0.0 - 0.1 K/uL   Immature Granulocytes 1 %   Abs Immature Granulocytes 0.16 (H) 0.00 - 0.07 K/uL    Comment: Performed at [facility], [place] [address], [place], [place] [address]  Protime-INR     Status: Abnormal   Collection Time: 04/29/22  2:13 PM  Result Value Ref Range   Prothrombin Time 16.6 (H) 11.4 - 15.2 seconds   INR 1.4 (H) 0.8 - 1.2    Comment: (NOTE) INR goal varies based on device and disease states. Performed at [facility], [place] [address], [place], [place] [address]   APTT     Status: None   Collection Time: 04/29/22  2:13 PM  Result Value Ref Range   aPTT 34 24 - 36 seconds    Comment: Performed at [facility], [place] [address], [place], [place] [address]  Urinalysis, Routine w reflex microscopic -     Status: Abnormal   Collection Time: 04/29/22  6:55 PM  Result Value Ref Range   Color, Urine YELLOW YELLOW   APPearance CLOUDY (A) CLEAR   Specific Gravity, Urine 1.011 1.005 - 1.030   pH 5.0 5.0 - 8.0   Glucose, UA >=500 (A) NEGATIVE mg/dL   Hgb urine dipstick LARGE (A) NEGATIVE   Bilirubin Urine NEGATIVE NEGATIVE   Ketones, ur 20 (A) NEGATIVE mg/dL   Protein, ur 100 (A) NEGATIVE mg/dL   Nitrite NEGATIVE NEGATIVE   Leukocytes,Ua SMALL (A) NEGATIVE   RBC / HPF 11-20 0 - 5 RBC/hpf   WBC, UA 21-50 0 - 5 WBC/hpf   Bacteria, UA MANY (A) NONE SEEN   Squamous Epithelial / HPF 0-5 0 - 5 /HPF   Mucus PRESENT  Budding Yeast PRESENT     Comment:  Performed at [facility], [place] [address] [place], [place] [address]   US Abdomen Limited RUQ (LIVER/GB)  Result Date: 04/29/2022 CLINICAL DATA:  Elevated serum bilirubin EXAM: ULTRASOUND ABDOMEN LIMITED RIGHT UPPER QUADRANT COMPARISON:  None Available. FINDINGS: Gallbladder: The gallbladder is distended and sludge filled. Gallbladder wall, however, is not thickened and no pericholecystic fluid is identified. The sonographic Percell Miller sign is reportedly negative. Common bile duct: Diameter: 6 mm in proximal diameter Liver: There is increased echogenicity of the portal triads, best appreciated on image # [id number]. This is a nonspecific finding that can be seen the setting of acute hepatitis. No focal intrahepatic masses or intrahepatic biliary duct dilation is identified. Portal vein is patent on color Doppler imaging with normal direction of blood flow towards the liver. Other: None. IMPRESSION: 1. Distended gallbladder with sludge. No sonographic evidence of acute cholecystitis. 2. Increased echogenicity of the portal triads. This is a nonspecific finding that can be seen the setting of acute hepatitis. Correlation with liver enzymes is recommended for further evaluation. Electronically Signed   By: Fidela Salisbury M.D.   On: 04/29/2022 22:06   CT ABDOMEN PELVIS WO CONTRAST  Result Date: 04/29/2022 CLINICAL DATA:  Fall with abdominal trauma EXAM: CT ABDOMEN AND PELVIS WITHOUT CONTRAST TECHNIQUE: Multidetector CT imaging of the abdomen and pelvis was performed following the standard protocol without IV contrast. RADIATION DOSE REDUCTION: This exam was performed according to the departmental dose-optimization program which includes automated exposure control, adjustment of the mA and/or kV according to patient size and/or use of iterative reconstruction technique. COMPARISON:  None Available. FINDINGS: Lower chest: Bibasilar dependent atelectasis. Coronary artery calcifications. Hepatobiliary:  Distended gallbladder. No pericholecystic inflammatory change or cholelithiasis. Unremarkable unenhanced appearance of the liver. Pancreas: Mild fatty atrophy of the pancreas. Spleen: Normal Adrenals/Urinary Tract: Normal adrenal glands. Both kidneys are normal. No ureteral obstruction or hydronephrosis. There is a Foley catheter within the urinary bladder. Stomach/Bowel: Normal stomach and duodenum. No dilated small bowel. Sigmoid diverticulosis without acute diverticulitis. Normal appendix. Vascular/Lymphatic: Calcific aortic atherosclerosis. No lymphadenopathy. Reproductive: Mildly enlarged prostate gland measures 5.8 cm. Other: Negative Musculoskeletal: Mildly displaced fractures of the posterior left eleventh and twelfth ribs. T12 wedge compression fracture with 75% height loss Minimally displaced fractures of the sacrum at the S3 and S4 levels. IMPRESSION: 1. Minimally displaced sacral fractures at the S3 and S4 levels (which were outside the field of view of the lumbar spine CT). These are favored to be acute. 2. Mildly displaced fractures of the posterior left eleventh and twelfth ribs. 3. T12 wedge compression fracture with 75% height loss, favored to be chronic. 4. Distended gallbladder without pericholecystic inflammatory change or cholelithiasis. 5. Sigmoid diverticulosis without acute diverticulitis. Aortic Atherosclerosis (ICD10-I70.0). Electronically Signed   By: Ulyses Jarred M.D.   On: 04/29/2022 20:16   CT L-SPINE NO CHARGE  Result Date: 04/29/2022 CLINICAL DATA:  Fall EXAM: CT LUMBAR SPINE WITHOUT CONTRAST TECHNIQUE: Multidetector CT imaging of the lumbar spine was performed without intravenous contrast administration. Multiplanar CT image reconstructions were also generated. RADIATION DOSE REDUCTION: This exam was performed according to the departmental dose-optimization program which includes automated exposure control, adjustment of the mA and/or kV according to patient size and/or use of  iterative reconstruction technique. COMPARISON:  None Available. FINDINGS: Segmentation: 5 lumbar type vertebrae. Alignment: Normal. Vertebrae: Wedge compression fracture of T12 with 75% height loss appears to be chronic but is strictly age indeterminate. Large superior  endplate Schmorl's node at L4. Paraspinal and other soft tissues: Calcific aortic atherosclerosis. Mildly displaced fracture of the left twelfth rib. Disc levels: Multilevel degenerative disc disease and facet arthrosis without spinal canal stenosis or neural impingement. IMPRESSION: 1. Wedge compression fracture of T12 with 75% height loss, favored to be chronic. 2. Mildly displaced fracture of the left twelfth rib. Aortic Atherosclerosis (ICD10-I70.0). Electronically Signed   By: Ulyses Jarred M.D.   On: 04/29/2022 20:09   DG Lumbar Spine Complete  Result Date: 04/29/2022 CLINICAL DATA:  Fall last night.  Pain.  Questionable sepsis. EXAM: LUMBAR SPINE - COMPLETE 4+ VIEW COMPARISON:  None Available. FINDINGS: There is diffuse decreased bone mineralization. There is a minimally displaced acute posterior left twelfth rib fracture with approximately 2 mm cortical step-off. There are 5 non-rib-bearing lumbar-type vertebral bodies. There is moderate to severe mid and anterior T12 vertebral body height loss with approximately 5 mm retropulsion of the posterosuperior T12 vertebral body. Mild posterior L5-S1 disc space narrowing. IMPRESSION: 1. Minimally displaced acute posterior left twelfth rib fracture. 2. Moderate to severe mid and anterior T12 vertebral body height loss with approximately 5 mm retropulsion of the posterosuperior T12 vertebral body. This is age indeterminate, and recommend correlation for point tenderness. There may be an acute fracture lucency at the superior aspect of the T12 vertebral body, and the acute fracture of the posterior left twelfth rib raises suspicion for an acute compression fracture component given this is at the  same level. Electronically Signed   By: Yvonne Kendall M.D.   On: 04/29/2022 14:13   DG Chest 1 View  Result Date: 04/29/2022 CLINICAL DATA:  Fall last night.  Pain. EXAM: CHEST  1 VIEW COMPARISON:  AP chest 02/11/2021 FINDINGS: Cardiac silhouette and mediastinal contours are within normal limits. Mild calcification within the aortic arch. There is improved aeration of the inferior right lung with resolution of prior linear atelectasis. Left basilar curvilinear scarring appears unchanged from 02/11/2021. No definite acute airspace opacity is seen. No definite pleural effusion is seen. No pneumothorax. There is a minimally displaced acute posterior left twelfth rib fracture, better seen on contemporaneous lumbar spine radiographs. IMPRESSION: 1. Improved aeration of the inferior right lung with resolution of prior linear atelectasis. 2. No definite acute lung process. 3. Minimally displaced acute posterior left twelfth rib fracture, better seen on contemporaneous lumbar spine radiographs. Electronically Signed   By: Yvonne Kendall M.D.   On: 04/29/2022 14:10   CT Head Wo Contrast  Result Date: 04/29/2022 CLINICAL DATA:  Multiple falls EXAM: CT HEAD WITHOUT CONTRAST CT CERVICAL SPINE WITHOUT CONTRAST TECHNIQUE: Multidetector CT imaging of the head and cervical spine was performed following the standard protocol without intravenous contrast. Multiplanar CT image reconstructions of the cervical spine were also generated. RADIATION DOSE REDUCTION: This exam was performed according to the departmental dose-optimization program which includes automated exposure control, adjustment of the mA and/or kV according to patient size and/or use of iterative reconstruction technique. COMPARISON:  02/09/2021 FINDINGS: CT HEAD FINDINGS Brain: No evidence of acute infarction, hemorrhage, hydrocephalus, extra-axial collection or mass lesion/mass effect. Vascular: No hyperdense vessel or unexpected calcification. Skull: Normal.  Negative for fracture or focal lesion. Sinuses/Orbits: No acute finding. Other: None. CT CERVICAL SPINE FINDINGS Alignment: Degenerative straightening of the normal cervical lordosis. Skull base and vertebrae: No acute fracture. No primary bone lesion or focal pathologic process. Soft tissues and spinal canal: No prevertebral fluid or swelling. No visible canal hematoma. Disc levels: Severe multilevel disc space  height loss and osteophytosis, worst from C4-C6. Upper chest: Negative. Other: None. IMPRESSION: 1. No acute intracranial pathology. 2. No fracture or static subluxation of the cervical spine. 3. Severe multilevel cervical disc degenerative disease. Electronically Signed   By: Delanna Ahmadi M.D.   On: 04/29/2022 13:41   CT Cervical Spine Wo Contrast  Result Date: 04/29/2022 CLINICAL DATA:  Multiple falls EXAM: CT HEAD WITHOUT CONTRAST CT CERVICAL SPINE WITHOUT CONTRAST TECHNIQUE: Multidetector CT imaging of the head and cervical spine was performed following the standard protocol without intravenous contrast. Multiplanar CT image reconstructions of the cervical spine were also generated. RADIATION DOSE REDUCTION: This exam was performed according to the departmental dose-optimization program which includes automated exposure control, adjustment of the mA and/or kV according to patient size and/or use of iterative reconstruction technique. COMPARISON:  02/09/2021 FINDINGS: CT HEAD FINDINGS Brain: No evidence of acute infarction, hemorrhage, hydrocephalus, extra-axial collection or mass lesion/mass effect. Vascular: No hyperdense vessel or unexpected calcification. Skull: Normal. Negative for fracture or focal lesion. Sinuses/Orbits: No acute finding. Other: None. CT CERVICAL SPINE FINDINGS Alignment: Degenerative straightening of the normal cervical lordosis. Skull base and vertebrae: No acute fracture. No primary bone lesion or focal pathologic process. Soft tissues and spinal canal: No prevertebral fluid  or swelling. No visible canal hematoma. Disc levels: Severe multilevel disc space height loss and osteophytosis, worst from C4-C6. Upper chest: Negative. Other: None. IMPRESSION: 1. No acute intracranial pathology. 2. No fracture or static subluxation of the cervical spine. 3. Severe multilevel cervical disc degenerative disease. Electronically Signed   By: Delanna Ahmadi M.D.   On: 04/29/2022 13:41    Pending Labs Unresulted Labs (From admission, onward)     Start     Ordered   04/29/22 2214  Hepatitis panel, acute  Once,   R        04/29/22 2213   04/29/22 2152  Culture, Urine (Do not remove urinary catheter, catheter placed by urology or difficult to place)  (Urine Culture)  Add-on,   AD       Question:  Indication  Answer:  Dysuria   04/29/22 2151   04/29/22 1301  Blood Culture (routine x 2)  (Undifferentiated presentation (screening labs and basic nursing orders))  BLOOD CULTURE X 2,   STAT      04/29/22 1301            Vitals/Pain Today's Vitals   04/29/22 2055 04/29/22 2056 04/29/22 2100 04/29/22 2130  BP:   130/80 127/78  Pulse: (!) 115  (!) 111 (!) 111  Resp: 17  (!) 21 (!) 32  Temp:  98.6 F (37 C)    TempSrc:  Oral    SpO2: 94%  93% 97%  Weight:      Height:      PainSc:        Isolation Precautions No active isolations  Medications Medications  cefTRIAXone (ROCEPHIN) 1 g in sodium chloride 0.9 % 100 mL IVPB (has no administration in time range)  sodium chloride 0.9 % bolus 1,000 mL (0 mLs Intravenous Stopped 04/29/22 1621)  lactated ringers bolus 1,000 mL (1,000 mLs Intravenous New Bag/Given 04/29/22 1745)    Mobility walks with device     Focused Assessments    R Recommendations: See Admitting Provider Note  Report given to:   Additional Notes:

## 2022-04-30 DIAGNOSIS — N179 Acute kidney failure, unspecified: Secondary | ICD-10-CM | POA: Diagnosis present

## 2022-04-30 DIAGNOSIS — D696 Thrombocytopenia, unspecified: Secondary | ICD-10-CM | POA: Diagnosis present

## 2022-04-30 DIAGNOSIS — I700 Atherosclerosis of aorta: Secondary | ICD-10-CM | POA: Diagnosis present

## 2022-04-30 DIAGNOSIS — Z978 Presence of other specified devices: Secondary | ICD-10-CM | POA: Diagnosis not present

## 2022-04-30 DIAGNOSIS — E785 Hyperlipidemia, unspecified: Secondary | ICD-10-CM | POA: Diagnosis present

## 2022-04-30 DIAGNOSIS — H548 Legal blindness, as defined in USA: Secondary | ICD-10-CM | POA: Diagnosis present

## 2022-04-30 DIAGNOSIS — E119 Type 2 diabetes mellitus without complications: Secondary | ICD-10-CM | POA: Diagnosis present

## 2022-04-30 DIAGNOSIS — S3210XA Unspecified fracture of sacrum, initial encounter for closed fracture: Secondary | ICD-10-CM

## 2022-04-30 DIAGNOSIS — J9811 Atelectasis: Secondary | ICD-10-CM | POA: Diagnosis present

## 2022-04-30 DIAGNOSIS — K8020 Calculus of gallbladder without cholecystitis without obstruction: Secondary | ICD-10-CM | POA: Diagnosis present

## 2022-04-30 DIAGNOSIS — E871 Hypo-osmolality and hyponatremia: Secondary | ICD-10-CM | POA: Diagnosis present

## 2022-04-30 DIAGNOSIS — R627 Adult failure to thrive: Secondary | ICD-10-CM | POA: Diagnosis present

## 2022-04-30 DIAGNOSIS — M6282 Rhabdomyolysis: Secondary | ICD-10-CM | POA: Diagnosis present

## 2022-04-30 DIAGNOSIS — N3001 Acute cystitis with hematuria: Secondary | ICD-10-CM | POA: Diagnosis not present

## 2022-04-30 DIAGNOSIS — K5730 Diverticulosis of large intestine without perforation or abscess without bleeding: Secondary | ICD-10-CM | POA: Diagnosis present

## 2022-04-30 DIAGNOSIS — Z66 Do not resuscitate: Secondary | ICD-10-CM | POA: Diagnosis present

## 2022-04-30 DIAGNOSIS — T83518A Infection and inflammatory reaction due to other urinary catheter, initial encounter: Secondary | ICD-10-CM | POA: Diagnosis present

## 2022-04-30 DIAGNOSIS — N138 Other obstructive and reflux uropathy: Secondary | ICD-10-CM | POA: Diagnosis present

## 2022-04-30 DIAGNOSIS — W1830XA Fall on same level, unspecified, initial encounter: Secondary | ICD-10-CM | POA: Diagnosis present

## 2022-04-30 DIAGNOSIS — I10 Essential (primary) hypertension: Secondary | ICD-10-CM | POA: Diagnosis present

## 2022-04-30 DIAGNOSIS — N390 Urinary tract infection, site not specified: Secondary | ICD-10-CM | POA: Diagnosis present

## 2022-04-30 DIAGNOSIS — Z794 Long term (current) use of insulin: Secondary | ICD-10-CM | POA: Diagnosis not present

## 2022-04-30 DIAGNOSIS — Z87891 Personal history of nicotine dependence: Secondary | ICD-10-CM | POA: Diagnosis not present

## 2022-04-30 DIAGNOSIS — Y846 Urinary catheterization as the cause of abnormal reaction of the patient, or of later complication, without mention of misadventure at the time of the procedure: Secondary | ICD-10-CM | POA: Diagnosis present

## 2022-04-30 DIAGNOSIS — Y92019 Unspecified place in single-family (private) house as the place of occurrence of the external cause: Secondary | ICD-10-CM | POA: Diagnosis not present

## 2022-04-30 DIAGNOSIS — S2242XA Multiple fractures of ribs, left side, initial encounter for closed fracture: Secondary | ICD-10-CM | POA: Diagnosis present

## 2022-04-30 DIAGNOSIS — S22080A Wedge compression fracture of T11-T12 vertebra, initial encounter for closed fracture: Secondary | ICD-10-CM | POA: Diagnosis present

## 2022-04-30 DIAGNOSIS — K828 Other specified diseases of gallbladder: Secondary | ICD-10-CM | POA: Diagnosis present

## 2022-04-30 DIAGNOSIS — N401 Enlarged prostate with lower urinary tract symptoms: Secondary | ICD-10-CM | POA: Diagnosis present

## 2022-04-30 LAB — COMPREHENSIVE METABOLIC PANEL
ALT: 50 U/L — ABNORMAL HIGH (ref 0–44)
AST: 115 U/L — ABNORMAL HIGH (ref 15–41)
Albumin: 2.6 g/dL — ABNORMAL LOW (ref 3.5–5.0)
Alkaline Phosphatase: 50 U/L (ref 38–126)
Anion gap: 12 (ref 5–15)
BUN: 73 mg/dL — ABNORMAL HIGH (ref 8–23)
CO2: 20 mmol/L — ABNORMAL LOW (ref 22–32)
Calcium: 7.6 mg/dL — ABNORMAL LOW (ref 8.9–10.3)
Chloride: 99 mmol/L (ref 98–111)
Creatinine, Ser: 2.74 mg/dL — ABNORMAL HIGH (ref 0.61–1.24)
GFR, Estimated: 24 mL/min — ABNORMAL LOW (ref >60)
Glucose, Bld: 110 mg/dL — ABNORMAL HIGH (ref 70–99)
Potassium: 4.1 mmol/L (ref 3.5–5.1)
Sodium: 131 mmol/L — ABNORMAL LOW (ref 135–145)
Total Bilirubin: 1.5 mg/dL — ABNORMAL HIGH (ref 0.3–1.2)
Total Protein: 5.3 g/dL — ABNORMAL LOW (ref 6.5–8.1)

## 2022-04-30 LAB — CBC
HCT: 37.2 % — ABNORMAL LOW (ref 39.0–52.0)
Hemoglobin: 12.3 g/dL — ABNORMAL LOW (ref 13.0–17.0)
MCH: 28 pg (ref 26.0–34.0)
MCHC: 33.1 g/dL (ref 30.0–36.0)
MCV: 84.5 fL (ref 80.0–100.0)
Platelets: 128 10*3/uL — ABNORMAL LOW (ref 150–400)
RBC: 4.4 MIL/uL (ref 4.22–5.81)
RDW: 14.4 % (ref 11.5–15.5)
WBC: 8.5 10*3/uL (ref 4.0–10.5)
nRBC: 0 % (ref 0.0–0.2)

## 2022-04-30 LAB — HEMOGLOBIN A1C
Hgb A1c MFr Bld: 7.9 % — ABNORMAL HIGH (ref 4.8–5.6)
Mean Plasma Glucose: 180.03 mg/dL

## 2022-04-30 LAB — GLUCOSE, CAPILLARY
Glucose-Capillary: 103 mg/dL — ABNORMAL HIGH (ref 70–99)
Glucose-Capillary: 149 mg/dL — ABNORMAL HIGH (ref 70–99)
Glucose-Capillary: 167 mg/dL — ABNORMAL HIGH (ref 70–99)
Glucose-Capillary: 193 mg/dL — ABNORMAL HIGH (ref 70–99)

## 2022-04-30 LAB — HEPATITIS PANEL, ACUTE
HCV Ab: NONREACTIVE
Hep A IgM: NONREACTIVE
Hep B C IgM: NONREACTIVE
Hepatitis B Surface Ag: NONREACTIVE

## 2022-04-30 LAB — HIV ANTIBODY (ROUTINE TESTING W REFLEX): HIV Screen 4th Generation wRfx: NONREACTIVE

## 2022-04-30 LAB — CK: Total CK: 6507 U/L — ABNORMAL HIGH (ref 49–397)

## 2022-04-30 MED ORDER — CHLORHEXIDINE GLUCONATE CLOTH 2 % EX PADS
6.0000 | MEDICATED_PAD | Freq: Every day | CUTANEOUS | Status: DC
  Administered 2022-04-30 – 2022-05-02 (×3): 6 via TOPICAL

## 2022-04-30 MED ORDER — SODIUM CHLORIDE 0.9 % IV SOLN: INTRAVENOUS | Status: DC

## 2022-04-30 NOTE — Progress Notes (Signed)
Mobility Specialist - Progress Note   04/30/22 1548  Oxygen Therapy  O2 Device Nasal Cannula  Mobility  Activity Ambulated with assistance in hallway  Level of Assistance Standby assist, set-up cues, supervision of patient - no hands on  Assistive Device Front wheel walker  Distance Ambulated (ft) 260 ft  Activity Response Tolerated well  Mobility Referral Yes  $Mobility charge 1 Mobility   Pt received in bed and agreeable to mobility. No complaints during session. Pt to bed after session with all needs met & nurse in room.  [facility]

## 2022-04-30 NOTE — Progress Notes (Signed)
Progress Note   Patient: Dustin Duffy ZWC:[id number] DOB: [birth year] DOA: 04/29/2022     0 DOS: the patient was seen and examined on 04/30/2022   Brief hospital course: 67 year old man PMH including diabetes, BPH with chronic urinary retention and indwelling Foley catheter, legally blind, lives alone presented with frequent falls, generalized weakness.  Recently seen by [facility] and started on antibiotic for UTI.  Assessment and Plan: Acute kidney injury: Possibly related to chronic urinary obstruction given findings (postrenal).  Does not drink a lot of fluid at home.  Fair urine output.  CT no evidence of obstruction or hydronephrosis. Mild rhabdomyolysis.  Trend CK continue IV fluids. Bactrim discontinued. Xigduo on hold Creatinine 3.7 > 2.74   UTI with hematuria in setting of BPH with chronic indwelling Foley catheter: Diagnosed with UTI by his urologist on 2/1 and started on course of Bactrim which is held due to AKI.  Foley exchanged on 2/1. Continue ceftriaxone.  Continue Flomax, Foley care.  Follow-up urine culture.   Mixed pattern elevated LFTs: Etiology unclear.  Transaminases are trending down as is total bilirubin.  Imaging showed distended gallbladder with sludge but no acute cholecystitis.  Patient has no abdominal tenderness.  Imaging also suggested the possibility of acute hepatitis.  Hepatitis panel is negative. Since the patient has no abdominal pain, is asymptomatic and LFTs are trending down we will monitor. Statin on hold. AST 253 > 115 ALT 73 > 50 T bili 2.2 > 1.4 CK 6507   Hypoxia asymptomatic, no acute hypoxic respiratory failure. Acute mildly displaced posterior left 11th and 12th rib fractures: Suspect secondary from hypoventilation, atelectasis.  SpO2 dropped to 88% while in the ED, stable on 2 L O2 via [place].  He denies any shortness of breath.  CXR otherwise without infiltrate or consolidation. Wean oxygen as tolerated.  Continue incentive spirometry.  I  reviewed with the patient.   Frequent falls with injury Acute minimally displaced S3 and S4 sacral fractures T12 wedge compression fracture, chronic: Numerous falls at home.  Found to have acute sacral fractures and left 11th and 12th rib fractures as above otherwise no other significant injury. PT/OT eval   Type 2 diabetes: Hgb A1c 7.9 CBG stable.  Given AKI hold home meds   Essential hypertension: Continue Lopressor.   Hyperlipidemia: Holding statin with elevated LFTs.  Mild hyponatremia No offending medications noted.  Chloride now within normal limits. IV fluids, check BMP in AM.  Thrombocytopenia Plts 128  Aortic Atherosclerosis  Follow-up as an outpatient.     Subjective:  Feels better Multiple falls at home No rib pain Breathing ok Eating ok Has some low back pain, sacral pain No bladder pain since antibiotic as an outpatient  Physical Exam: Vitals:   04/30/22 0509 04/30/22 0800 04/30/22 0909 04/30/22 0910  BP: 105/65  125/76   Pulse: (!) 104  (!) 110   Resp: 16  20   Temp: 98.7 F (37.1 C)   98.9 F (37.2 C)  TempSrc: Oral   Oral  SpO2: 96% 92% 98% 96%  Weight:      Height:       Physical Exam Vitals reviewed.  Constitutional:      General: He is not in acute distress.    Appearance: He is not ill-appearing or toxic-appearing.  Cardiovascular:     Rate and Rhythm: Normal rate and regular rhythm.     Heart sounds: No murmur heard. Pulmonary:     Effort: Pulmonary effort is normal. No respiratory  distress.     Breath sounds: No wheezing, rhonchi or rales.  Musculoskeletal:     Comments: Moves BLE  Skin:    Comments: Ecchymosis face, right shoulder right chest seen  Neurological:     Mental Status: He is alert.  Psychiatric:        Mood and Affect: Mood normal.        Behavior: Behavior normal.   Data Reviewed: CBG stable Na+ 133 > 131 Creatinine 3.7 > 2.74 AST 253 > 115 ALT 73 > 50 T bili 2.2 > 1.4 CK 6507 Hgb 12.3, likely at  baseline Plts 128 Hgb A1c 7.9 Hepatitis panel non-reactive BC, UC pending CT IMPRESSION: 1. Minimally displaced sacral fractures at the S3 and S4 levels (which were outside the field of view of the lumbar spine CT). These are favored to be acute. 2. Mildly displaced fractures of the posterior left eleventh and twelfth ribs. 3. T12 wedge compression fracture with 75% height loss, favored to be chronic. 4. Distended gallbladder without pericholecystic inflammatory change or cholelithiasis. 5. Sigmoid diverticulosis without acute diverticulitis.   Aortic Atherosclerosis (ICD10-I70.0).  U/s IMPRESSION: 1. Distended gallbladder with sludge. No sonographic evidence of acute cholecystitis. 2. Increased echogenicity of the portal triads. This is a nonspecific finding that can be seen the setting of acute hepatitis. Correlation with liver enzymes is recommended for further evaluation.  Family Communication: none  Disposition: Status is: Observation   Planned Discharge Destination: Home with Home Health    Time spent: 20 minutes  Author: Murray Hodgkins, MD 04/30/2022 11:40 AM  For on call review [email or web address].

## 2022-04-30 NOTE — Evaluation (Signed)
Occupational Therapy Evaluation Patient Details Name: Dustin Duffy MRN: [id number] DOB: 10-12-1955 Today's Date: 04/30/2022   History of Present Illness Patient is a 67 year old male who presented with frequent falls and generalized weakness. CT abdomen/pelvis and CT L-spine "showed minimally displaced sacral fractures at S3 and S4 favored to be acute.  Mildly displaced fractures of posterior left 11th and 12th ribs noted.  T12 wedge compression fracture with 75% height loss favored to be chronic" PMH: urology 04/28/22 with UTI, DM II, HLD, BPH   Clinical Impression   Patient is a 67 year old male who was admitted for above. Patient was living at home alone with small older dog and PRN support from sister prior level. Currently, patient was noted to have decreased functional activity tolerance, increased falls, decreased standing balance, decreased safety awareness, h/o visual deficits impacting participation in ADLs. Patient would continue to benefit from skilled OT services at this time while admitted and after d/c to address noted deficits in order to improve overall safety and independence in ADLs.        Recommendations for follow up therapy are one component of a multi-disciplinary discharge planning process, led by the attending physician.  Recommendations may be updated based on patient status, additional functional criteria and insurance authorization.   Follow Up Recommendations  Home health OT     Assistance Recommended at Discharge Frequent or constant Supervision/Assistance  Patient can return home with the following A little help with walking and/or transfers;Assistance with cooking/housework;Direct supervision/assist for medications management;Assist for transportation;Help with stairs or ramp for entrance;Direct supervision/assist for financial management;A little help with bathing/dressing/bathroom    Functional Status Assessment  Patient has had a recent decline in their  functional status and demonstrates the ability to make significant improvements in function in a reasonable and predictable amount of time.  Equipment Recommendations  None recommended by OT    Recommendations for Other Services       Precautions / Restrictions Precautions Precautions: Fall Precaution Comments: multiple recent falls at home, monitor O2 and HR Restrictions Weight Bearing Restrictions: No      Mobility Bed Mobility Overal bed mobility: Needs Assistance Bed Mobility: Supine to Sit     Supine to sit: Min guard     General bed mobility comments: HOB elevated with min guard for safety       Balance Overall balance assessment: Needs assistance Sitting-balance support: No upper extremity supported, Feet supported Sitting balance-Leahy Scale: Fair     Standing balance support: Single extremity supported Standing balance-Leahy Scale: Poor         ADL either performed or assessed with clinical judgement   ADL Overall ADL's : Needs assistance/impaired Eating/Feeding: Modified independent;Sitting Eating/Feeding Details (indicate cue type and reason): with education on importance of taking sips and meals slow. patient noted to have choking episode with sips from water. patient appeared to have water in nose afer sips butpatient reporting it was "just my nose running". nurse made aware about concerns over speed of meal intake and need to slow down. nurse to check on patient. Grooming: Dance movement psychotherapist;Wash/dry hands;Set up;Sitting   Upper Body Bathing: Set up;Sitting     Lower Body Bathing Details (indicate cue type and reason): patient was able to simulate LLE but not RLE to lap with increased pain in sacrum hurting on RLE. Upper Body Dressing : Set up;Sitting   Lower Body Dressing: Minimal assistance;Sit to/from stand;Sitting/lateral leans Lower Body Dressing Details (indicate cue type and reason): patient needed one  UE support on RW. patient noted to have  increaed pain with movement of RLE compared to LLE with don/doff sock attempts. Toilet Transfer: Minimal assistance;Ambulation;Rolling walker (2 wheels)   Toileting- Clothing Manipulation and Hygiene: Maximal assistance;Sit to/from stand               Vision   Additional Comments: patient reported having 10% of vision with patient unable to describe what he is able to see. patient reported " i see you but i would not be able to recognize you out in the community. patient was able to look towards each therapist when they were speaking. patient able to find items on tray with no issue.            Pertinent Vitals/Pain Pain Assessment Pain Assessment: Faces Faces Pain Scale: Hurts little more Pain Location: ribs, sacrum Pain Descriptors / Indicators: Aching, Sore Pain Intervention(s): Limited activity within patient's tolerance, Monitored during session, Premedicated before session     Hand Dominance Right   Extremity/Trunk Assessment Upper Extremity Assessment Upper Extremity Assessment: Overall WFL for tasks assessed   Lower Extremity Assessment Lower Extremity Assessment: Defer to PT evaluation   Cervical / Trunk Assessment Cervical / Trunk Assessment: Other exceptions Cervical / Trunk Exceptions: multiple bruises noted front and back   Communication Communication Communication: No difficulties   Cognition Arousal/Alertness: Awake/alert Behavior During Therapy: WFL for tasks assessed/performed Overall Cognitive Status: Within Functional Limits for tasks assessed     General Comments: appeared to have poor insight to deficits during session when education on slow sips through straw were provided.                Home Living Family/patient expects to be discharged to:: Private residence Living Arrangements: Alone Available Help at Discharge: Family;Friend(s);Available PRN/intermittently Type of Home: House Home Access: Stairs to enter [employer] of  Steps: 3+1 Entrance Stairs-Rails: Right;Left;Can reach both Home Layout: One level     Bathroom Shower/Tub: Occupational psychologist: Handicapped height Bathroom Accessibility: Yes   Home Equipment: Rollator (4 wheels);Cane - single point          Prior Functioning/Environment Prior Level of Function : Independent/Modified Independent             Mobility Comments: pt reports use of rollator or SPC ADLs Comments: takes care of small dog at home        OT Problem List: Decreased strength;Decreased activity tolerance;Impaired balance (sitting and/or standing);Decreased coordination;Decreased safety awareness;Decreased knowledge of precautions;Cardiopulmonary status limiting activity      OT Treatment/Interventions: Energy conservation;Therapeutic exercise;Self-care/ADL training;DME and/or AE instruction;Therapeutic activities;Patient/family education;Balance training    OT Goals(Current goals can be found in the care plan section) Acute Rehab OT Goals Patient Stated Goal: to get home OT Goal Formulation: With patient Time For Goal Achievement: 05/14/22 Potential to Achieve Goals: Fair  OT Frequency: Min 2X/week    Co-evaluation   Reason for Co-Treatment: For patient/therapist safety (multiple falls in last few weeks (15)) PT goals addressed during session: Mobility/safety with mobility OT goals addressed during session: ADL's and self-care      AM-PAC OT "6 Clicks" Daily Activity     Outcome Measure Help from another person eating meals?: A Little Help from another person taking care of personal grooming?: A Little Help from another person toileting, which includes using toliet, bedpan, or urinal?: A Lot Help from another person bathing (including washing, rinsing, drying)?: A Lot Help from another person to put on and taking off regular  upper body clothing?: A Little Help from another person to put on and taking off regular lower body clothing?: A Lot 6  Click Score: 15   End of Session Equipment Utilized During Treatment: Gait belt;Rolling walker (2 wheels) Nurse Communication: Mobility status;Other (comment) (concerns over swallowing during session)  Activity Tolerance: Patient tolerated treatment well Patient left: in chair;with call bell/phone within reach;with chair alarm set  OT Visit Diagnosis: Unsteadiness on feet (R26.81);Other abnormalities of gait and mobility (R26.89);Muscle weakness (generalized) (M62.81)                Time: 1308-6578 OT Time Calculation (min): 28 min Charges:  OT General Charges $OT Visit: 1 Visit OT Evaluation $OT Eval Moderate Complexity: 1 Mod  Paula Busenbark OTR/L, MS Acute Rehabilitation Department Office# [phone]   Willa Rough 04/30/2022, 3:52 PM

## 2022-04-30 NOTE — Evaluation (Signed)
Physical Therapy Evaluation Patient Details Name: Dustin Duffy MRN: [id number] DOB: Jan 29, 1956 Today's Date: 04/30/2022  History of Present Illness  Pt admitted from home 2* weakness and multiple falls at home. Pt with acute mininally displaced S3-S4 sacral fxs and acute mildly displaced posterior L 11th and 12th rib fx, as well as recent dx of UTI.  Pt with hx of DM, renal impairment, visual impairment (legally blind), chronic T-12 wedge fx, and BPH with urinary retention and chronic indwelling foley.  Clinical Impression  Pt admitted as above and presenting with functional mobility limitations 2* generalized weakness and balance deficits.  This am pt mobilizing at min assist to min guard level for safety and hopeful to progress to dc home with PRN assist of family.  Pt could benefit from follow up HHPT to maximize IND and safety at home.     Recommendations for follow up therapy are one component of a multi-disciplinary discharge planning process, led by the attending physician.  Recommendations may be updated based on patient status, additional functional criteria and insurance authorization.  Follow Up Recommendations Home health PT      Assistance Recommended at Discharge Intermittent Supervision/Assistance  Patient can return home with the following  A little help with walking and/or transfers;A little help with bathing/dressing/bathroom;Assistance with cooking/housework;Assist for transportation;Help with stairs or ramp for entrance    Equipment Recommendations None recommended by PT  Recommendations for Other Services       Functional Status Assessment Patient has had a recent decline in their functional status and demonstrates the ability to make significant improvements in function in a reasonable and predictable amount of time.     Precautions / Restrictions Precautions Precautions: Fall Precaution Comments: multiple recent falls at home Restrictions Weight Bearing  Restrictions: No      Mobility  Bed Mobility Overal bed mobility: Needs Assistance Bed Mobility: Supine to Sit     Supine to sit: Min guard     General bed mobility comments: HOB elevated with min guard for safety    Transfers Overall transfer level: Needs assistance Equipment used: Rolling walker (2 wheels) Transfers: Sit to/from Stand Sit to Stand: Min assist           General transfer comment: cues for LE management and use of UEs to self assist    Ambulation/Gait Ambulation/Gait assistance: Min assist, Min guard Gait Distance (Feet): 200 Feet Assistive device: Rolling walker (2 wheels) Gait Pattern/deviations: Step-to pattern, Step-through pattern, Decreased step length - right, Decreased step length - left, Shuffle, Trunk flexed       General Gait Details: min steady assist with increased time; cues for posture and position from [employer]            Wheelchair Mobility    Modified Rankin (Stroke Patients Only)       Balance Overall balance assessment: Needs assistance Sitting-balance support: No upper extremity supported, Feet supported Sitting balance-Leahy Scale: Fair     Standing balance support: Single extremity supported Standing balance-Leahy Scale: Poor                               Pertinent Vitals/Pain Pain Assessment Pain Assessment: Faces Faces Pain Scale: Hurts little more Pain Location: ribs, sacrum Pain Descriptors / Indicators: Aching, Sore Pain Intervention(s): Limited activity within patient's tolerance, Monitored during session, Premedicated before session    Home Living Family/patient expects to be discharged to:: Private residence Living Arrangements: Alone  Available Help at Discharge: Family;Friend(s);Available PRN/intermittently Type of Home: House Home Access: Stairs to enter Entrance Stairs-Rails: Right;Left;Can reach both Entrance Stairs-Number of Steps: 3+1   Home Layout: One level Home  Equipment: Rollator (4 wheels);Cane - single point      Prior Function Prior Level of Function : Independent/Modified Independent             Mobility Comments: pt reports use of rollator or SPC       Hand Dominance   Dominant Hand: Right    Extremity/Trunk Assessment   Upper Extremity Assessment Upper Extremity Assessment: Defer to OT evaluation    Lower Extremity Assessment Lower Extremity Assessment: Generalized weakness (significant bruising bil knees)    Cervical / Trunk Assessment Cervical / Trunk Assessment: Other exceptions Cervical / Trunk Exceptions: multiple bruises noted front and back  Communication   Communication: No difficulties  Cognition Arousal/Alertness: Awake/alert Behavior During Therapy: WFL for tasks assessed/performed Overall Cognitive Status: Within Functional Limits for tasks assessed                                          General Comments      Exercises     Assessment/Plan    PT Assessment Patient needs continued PT services  PT Problem List Decreased strength;Decreased activity tolerance;Decreased balance;Decreased mobility;Decreased knowledge of use of DME;Pain       PT Treatment Interventions DME instruction;Gait training;Stair training;Functional mobility training;Therapeutic activities;Therapeutic exercise;Patient/family education    PT Goals (Current goals can be found in the Care Plan section)  Acute Rehab PT Goals Patient Stated Goal: HOME to my dog PT Goal Formulation: With patient Time For Goal Achievement: 05/14/22 Potential to Achieve Goals: Good    Frequency Min 3X/week     Co-evaluation PT/OT/SLP Co-Evaluation/Treatment: Yes Reason for Co-Treatment: For patient/therapist safety (pt with hx of many falls in last several weeks) PT goals addressed during session: Mobility/safety with mobility OT goals addressed during session: ADL's and self-care       AM-PAC PT "6 Clicks" Mobility   Outcome Measure Help needed turning from your back to your side while in a flat bed without using bedrails?: A Little Help needed moving from lying on your back to sitting on the side of a flat bed without using bedrails?: A Little Help needed moving to and from a bed to a chair (including a wheelchair)?: A Little Help needed standing up from a chair using your arms (e.g., wheelchair or bedside chair)?: A Little Help needed to walk in hospital room?: A Little Help needed climbing 3-5 steps with a railing? : A Lot 6 Click Score: 17    End of Session Equipment Utilized During Treatment: Gait belt Activity Tolerance: Patient tolerated treatment well Patient left: in chair;with call bell/phone within reach;with chair alarm set Nurse Communication: Mobility status PT Visit Diagnosis: Unsteadiness on feet (R26.81);Muscle weakness (generalized) (M62.81);Difficulty in walking, not elsewhere classified (R26.2)    Time: 8250-5397 PT Time Calculation (min) (ACUTE ONLY): 30 min   Charges:   PT Evaluation $PT Eval Low Complexity: 1 Low          Debe Coder PT Acute Rehabilitation Services Pager [phone] Office [phone]   Dexton Zwilling 04/30/2022, 1:18 PM

## 2022-05-01 DIAGNOSIS — Z978 Presence of other specified devices: Secondary | ICD-10-CM | POA: Diagnosis not present

## 2022-05-01 DIAGNOSIS — E119 Type 2 diabetes mellitus without complications: Secondary | ICD-10-CM | POA: Diagnosis not present

## 2022-05-01 DIAGNOSIS — N179 Acute kidney failure, unspecified: Secondary | ICD-10-CM | POA: Diagnosis not present

## 2022-05-01 DIAGNOSIS — S2242XA Multiple fractures of ribs, left side, initial encounter for closed fracture: Secondary | ICD-10-CM | POA: Diagnosis not present

## 2022-05-01 LAB — CBC
HCT: 40.1 % (ref 39.0–52.0)
Hemoglobin: 12.6 g/dL — ABNORMAL LOW (ref 13.0–17.0)
MCH: 28.2 pg (ref 26.0–34.0)
MCHC: 31.4 g/dL (ref 30.0–36.0)
MCV: 89.7 fL (ref 80.0–100.0)
Platelets: 105 10*3/uL — ABNORMAL LOW (ref 150–400)
RBC: 4.47 MIL/uL (ref 4.22–5.81)
RDW: 14.7 % (ref 11.5–15.5)
WBC: 7.3 10*3/uL (ref 4.0–10.5)
nRBC: 0 % (ref 0.0–0.2)

## 2022-05-01 LAB — CK: Total CK: 1347 U/L — ABNORMAL HIGH (ref 49–397)

## 2022-05-01 LAB — URINE CULTURE: Culture: 30000 — AB

## 2022-05-01 LAB — COMPREHENSIVE METABOLIC PANEL
ALT: 45 U/L — ABNORMAL HIGH (ref 0–44)
AST: 52 U/L — ABNORMAL HIGH (ref 15–41)
Albumin: 2.5 g/dL — ABNORMAL LOW (ref 3.5–5.0)
Alkaline Phosphatase: 49 U/L (ref 38–126)
Anion gap: 13 (ref 5–15)
BUN: 43 mg/dL — ABNORMAL HIGH (ref 8–23)
CO2: 18 mmol/L — ABNORMAL LOW (ref 22–32)
Calcium: 7.6 mg/dL — ABNORMAL LOW (ref 8.9–10.3)
Chloride: 103 mmol/L (ref 98–111)
Creatinine, Ser: 1.36 mg/dL — ABNORMAL HIGH (ref 0.61–1.24)
GFR, Estimated: 57 mL/min — ABNORMAL LOW (ref >60)
Glucose, Bld: 130 mg/dL — ABNORMAL HIGH (ref 70–99)
Potassium: 4.3 mmol/L (ref 3.5–5.1)
Sodium: 134 mmol/L — ABNORMAL LOW (ref 135–145)
Total Bilirubin: 1.2 mg/dL (ref 0.3–1.2)
Total Protein: 5.5 g/dL — ABNORMAL LOW (ref 6.5–8.1)

## 2022-05-01 LAB — GLUCOSE, CAPILLARY
Glucose-Capillary: 120 mg/dL — ABNORMAL HIGH (ref 70–99)
Glucose-Capillary: 151 mg/dL — ABNORMAL HIGH (ref 70–99)
Glucose-Capillary: 273 mg/dL — ABNORMAL HIGH (ref 70–99)
Glucose-Capillary: 192 mg/dL — ABNORMAL HIGH (ref 70–99)

## 2022-05-01 MED ORDER — FLUCONAZOLE 100 MG PO TABS
200.0000 mg | ORAL_TABLET | Freq: Every day | ORAL | Status: DC
  Administered 2022-05-01 – 2022-05-02 (×2): 200 mg via ORAL
  Filled 2022-05-01 (×2): qty 2

## 2022-05-01 NOTE — TOC Initial Note (Signed)
Transition of Care [facility]) - Initial/Assessment Note    Patient Details  Name: Dustin Duffy MRN: [id number] Date of Birth: [birth year]  Transition of [facility] CM/SW Contact:    Henrietta Dine, RN Phone Number: 05/01/2022, 3:07 PM  Clinical Narrative:                 [facility] consult for HHPT; spoke w/ pt in room; he says he is from home and he plans to return at d/c; pt identified POC Luz Burcher (sister) [phone]; he denies IPV, food insecurity, or problems paying utilities; pt says he has transportation; Mr Kerstetter says he has a cane, walker, and bars in shower; he says he does not have glasses, dentures, HA, [place] services or home oxygen; he agrees to [place] and his agency of choice is [facility]; LVM for [place] weekend triad team at [place]; awaiting return call from agency; contact info for agency will be placed in follow up provider section of d/c instructions and pt will be notified; awaiting return call from agency.  Expected Discharge Plan: [place] Barriers to Discharge: Continued Medical Work up   Patient Goals and CMS Choice Patient states their goals for this hospitalization and ongoing recovery are:: home w/ home health CMS Medicare.gov Compare Post Acute Care list provided to:: Patient        Expected Discharge Plan and Services   Discharge Planning Services: CM Consult Post Acute Care Choice: [place] arrangements for the past 2 months: Single Family Home                               Date [place]: 05/02/22 Time [place]: 1856 Representative spoke with at [place]: LVM for [place] weekend triad team at [place]  Prior Living Arrangements/Services Living arrangements for the past 2 months: [place]   Patient language and need for interpreter reviewed:: Yes Do you feel safe going back to the place where you live?: Yes      Need for Family Participation in Patient Care:  Yes (Comment) Care giver support system in place?: Yes (comment) Current home services: DME (cane, walker) Criminal Activity/Legal Involvement Pertinent to Current Situation/Hospitalization: No - Comment as needed  Activities of Daily Living Home Assistive Devices/Equipment: None ADL Screening (condition at time of admission) Patient's cognitive ability adequate to safely complete daily activities?: Yes Is the patient deaf or have difficulty hearing?: No Does the patient have difficulty seeing, even when wearing glasses/contacts?: No Does the patient have difficulty concentrating, remembering, or making decisions?: No Patient able to express need for assistance with ADLs?: Yes Does the patient have difficulty dressing or bathing?: No Independently performs ADLs?: Yes (appropriate for developmental age) Does the patient have difficulty walking or climbing stairs?: No Weakness of Legs: Both Weakness of Arms/Hands: Both  Permission Sought/Granted Permission sought to share information with : Case Manager Permission granted to share information with : Yes, Verbal Permission Granted        Permission granted to share info w Relationship: Josearmando Kuhnert (sister) [phone]     Emotional Assessment Appearance:: Appears stated age Attitude/Demeanor/Rapport: Gracious Affect (typically observed): Accepting Orientation: : Oriented to Self, Oriented to Place, Oriented to  Time, Oriented to Situation Alcohol / Substance Use: Not Applicable Psych Involvement: No (comment)  Admission diagnosis:  AKI (acute kidney injury) ([place]) [N17.9] Acute kidney injury [facility]) [N17.9] Patient Active Problem List  Diagnosis Date Noted   Acute kidney injury ([place]) 04/29/2022   BPH with urinary obstruction 04/29/2022   Foley catheter in place prior to arrival 04/29/2022   Frequent falls 04/29/2022   Multiple fractures of ribs, left side, initial encounter for closed fracture 04/29/2022   Sacral fracture,  closed ([place]) 04/29/2022   Closed wedge compression fracture of T12 vertebra ([place]) 04/29/2022   UTI (urinary tract infection) 04/29/2022   Diabetes mellitus type 2 in nonobese ([place]) 02/09/2021   Dyslipidemia 02/09/2021   Essential hypertension 02/09/2021   Renal impairment 02/09/2021   Non-traumatic rhabdomyolysis 02/09/2021   Visual impairment 02/09/2021   PCP:  Janie Morning, DO Pharmacy:   [id number] [id number]: [phone]: [phone]    Social Determinants of Health (SDOH) Social History: SDOH Screenings   Food Insecurity: No Food Insecurity (05/01/2022)  Housing: Low Risk  (05/01/2022)  Transportation Needs: No Transportation Needs (05/01/2022)  Utilities: Not At Risk (05/01/2022)  Depression (PHQ2-9): Low Risk  (04/02/2021)  Tobacco Use: Medium Risk (04/29/2022)   SDOH Interventions: Food Insecurity Interventions: Inpatient TOC Housing Interventions: Inpatient TOC Transportation Interventions: Inpatient TOC Utilities Interventions: Inpatient TOC   Readmission Risk Interventions     No data to display

## 2022-05-01 NOTE — Progress Notes (Signed)
Progress Note   Patient: Dustin Duffy QQP:[id number] DOB: 1956/03/13 DOA: 04/29/2022     1 DOS: the patient was seen and examined on 05/01/2022   Brief hospital course: 67 year old man PMH including diabetes, BPH with chronic urinary retention and indwelling Foley catheter, legally blind, lives alone presented with frequent falls, generalized weakness.  Recently seen by [facility] and started on antibiotic for UTI.  Assessment and Plan: Acute kidney injury: Possibly related to chronic urinary obstruction given findings (postrenal).  Does not drink a lot of fluid at home.   CT no evidence of obstruction or hydronephrosis. Bactrim discontinued. Xigduo on hold Mild rhabdomyolysis improved.   UOP 2950 Creatinine down to 1.36, BUN trending down. Continue IVF   UTI with hematuria in setting of BPH with chronic indwelling Foley catheter: Diagnosed with UTI by his urologist on 2/1 and started on course of Bactrim which has been held due to AKI.  Foley exchanged on 2/1. Continue ceftriaxone.  Continue Flomax, Foley care.  UC 30k yeast. Add Diflucan   Mixed pattern elevated LFTs: Etiology unclear.  Transaminases are trending down as is total bilirubin.  Imaging showed distended gallbladder with sludge but no acute cholecystitis.  Patient has no abdominal tenderness.  Imaging also suggested the possibility of acute hepatitis.  Hepatitis panel is negative. Since the patient has no abdominal pain, is asymptomatic and LFTs are trending down we will monitor. Statin on hold CK trending down AST down to 52 ALT down to 45 CK down to 1347 Plts down to 105   Hypoxia asymptomatic, no acute hypoxic respiratory failure. Acute mildly displaced posterior left 11th and 12th rib fractures: Suspect secondary from hypoventilation, atelectasis.  SpO2 dropped to 88% while in the ED, stable on 2 L O2 via [place].  He denies any shortness of breath.  CXR otherwise without infiltrate or consolidation. Hypoxia resolved.   May have been secondary to pain.   Frequent falls with injury Acute minimally displaced S3 and S4 sacral fractures T12 wedge compression fracture, chronic: Numerous falls at home.  Found to have acute sacral fractures and left 11th and 12th rib fractures as above otherwise no other significant injury. PT/OT eval   Type 2 diabetes: Hgb A1c 7.9 CBG stable.  Given AKI hold home meds   Essential hypertension: Continue Lopressor.   Hyperlipidemia: Holding statin with elevated LFTs.   Mild hyponatremia No offending medications noted.  Sodium slowly improving. IV fluids, check BMP in AM.   Thrombocytopenia Plts 105. Monitor. Etiology unclear.   Aortic Atherosclerosis  Follow-up as an outpatient.  HHOTPT     Subjective:  260 ft  Feels better Thirsty   Physical Exam: Vitals:   04/30/22 1416 04/30/22 1417 04/30/22 2041 05/01/22 0442  BP:  (!) 142/91 111/73 121/83  Pulse: (!) 107 (!) 103 (!) 109 93  Resp:   19 16  Temp:   99.3 F (37.4 C) 98.6 F (37 C)  TempSrc:   Oral Oral  SpO2: 95% 96% 92% 92%  Weight:      Height:       Physical Exam Vitals reviewed.  Constitutional:      General: He is not in acute distress.    Appearance: He is not ill-appearing or toxic-appearing.  Cardiovascular:     Rate and Rhythm: Normal rate and regular rhythm.     Heart sounds: No murmur heard. Pulmonary:     Effort: Pulmonary effort is normal. No respiratory distress.     Breath sounds: No wheezing, rhonchi  or rales.  Neurological:     Mental Status: He is alert.  Psychiatric:        Mood and Affect: Mood normal.        Behavior: Behavior normal.     Data Reviewed: UOP 12950 CBG stable Creatinine down to 1.36, BUN trending down AST down to 52 ALT down to 45 CK down to 1347 Plts down to 105 UC 30k yeast  Family Communication: none  Disposition: Status is: Inpatient Remains inpatient appropriate because: AKI  Planned Discharge Destination: Home with Home  Health    Time spent: 25 minutes  Author: Murray Hodgkins, MD 05/01/2022 8:00 AM  For on call review [email or web address].

## 2022-05-01 NOTE — Progress Notes (Signed)
Physical Therapy Treatment Patient Details Name: Dustin Duffy MRN: [id number] DOB: [birth year] Today's Date: 05/01/2022   History of Present Illness Patient is a 67 year old male who presented with frequent falls and generalized weakness. CT abdomen/pelvis and CT L-spine "showed minimally displaced sacral fractures at S3 and S4 favored to be acute.  Mildly displaced fractures of posterior left 11th and 12th ribs noted.  T12 wedge compression fracture with 75% height loss favored to be chronic" PMH: urology 04/28/22 with UTI, DM II, HLD, BPH    PT Comments    Pt continues very motivated and progressing with mobility but with noted balance issues with sit to stand.  Pt states this is typical for him at home and he is careful to make sure he is steady before stepping away from sitting surface - pt also reports ~15 falls at home in last several weeks.   Recommendations for follow up therapy are one component of a multi-disciplinary discharge planning process, led by the attending physician.  Recommendations may be updated based on patient status, additional functional criteria and insurance authorization.  Follow Up Recommendations  Home health PT     Assistance Recommended at Discharge Intermittent Supervision/Assistance  Patient can return home with the following A little help with walking and/or transfers;A little help with bathing/dressing/bathroom;Assistance with cooking/housework;Assist for transportation;Help with stairs or ramp for entrance   Equipment Recommendations  None recommended by PT    Recommendations for Other Services       Precautions / Restrictions Precautions Precautions: Fall Precaution Comments: multiple recent falls at home, monitor O2 and HR Restrictions Weight Bearing Restrictions: No     Mobility  Bed Mobility               General bed mobility comments: Pt up in chair and returns to same    Transfers Overall transfer level: Needs  assistance Equipment used: Rolling walker (2 wheels) Transfers: Sit to/from Stand Sit to Stand: Min guard           General transfer comment: Steady assist  with cues for LE management and use of UEs to self assist;  Pt sit<>stand x 4    Ambulation/Gait Ambulation/Gait assistance: Min guard Gait Distance (Feet): 220 Feet Assistive device: Rolling walker (2 wheels) Gait Pattern/deviations: Step-to pattern, Step-through pattern, Decreased step length - right, Decreased step length - left, Shuffle, Trunk flexed       General Gait Details: min steady assist with increased time; cues for posture and position from RW; standing rest breaks x 2   Stairs             Wheelchair Mobility    Modified Rankin (Stroke Patients Only)       Balance Overall balance assessment: Needs assistance Sitting-balance support: No upper extremity supported, Feet supported Sitting balance-Leahy Scale: Fair     Standing balance support: Single extremity supported Standing balance-Leahy Scale: Poor Standing balance comment: leaning back onto surfaces behind patient with toes off the ground without cues to keep down                            Cognition Arousal/Alertness: Awake/alert Behavior During Therapy: WFL for tasks assessed/performed Overall Cognitive Status: Within Functional Limits for tasks assessed                                 General Comments: continues to have  questionable insight to deficits with balance and restrictions on fluids from MD. pleasant and cooperative        Exercises      General Comments General comments (skin integrity, edema, etc.): patient reported that friend was watching dog at this time. patient was educated on how it would be better if friend could continue to watch dog for him while he continues to get better. patient reported he would prefer that he had the dog with him but would consider the eduaction. patients HR was  80-98 bpm during ADL tasks. patient was noted to have O2 range from 86% on RA to 93% on RA with activity with rate constantly changing and O2 reader switched on digit. no SOB observed      Pertinent Vitals/Pain Pain Assessment Pain Assessment: Faces Faces Pain Scale: Hurts little more Pain Location: ribs, sacrum Pain Descriptors / Indicators: Aching, Sore Pain Intervention(s): Limited activity within patient's tolerance, Monitored during session, Premedicated before session    Home Living                          Prior Function            PT Goals (current goals can now be found in the care plan section) Acute Rehab PT Goals Patient Stated Goal: HOME to my dog PT Goal Formulation: With patient Time For Goal Achievement: 05/14/22 Potential to Achieve Goals: Good Progress towards PT goals: Progressing toward goals    Frequency    Min 3X/week      PT Plan Current plan remains appropriate    Co-evaluation              AM-PAC PT "6 Clicks" Mobility   Outcome Measure  Help needed turning from your back to your side while in a flat bed without using bedrails?: A Little Help needed moving from lying on your back to sitting on the side of a flat bed without using bedrails?: A Little Help needed moving to and from a bed to a chair (including a wheelchair)?: A Little Help needed standing up from a chair using your arms (e.g., wheelchair or bedside chair)?: A Little Help needed to walk in hospital room?: A Little Help needed climbing 3-5 steps with a railing? : A Lot 6 Click Score: 17    End of Session Equipment Utilized During Treatment: Gait belt Activity Tolerance: Patient tolerated treatment well Patient left: in chair;with call bell/phone within reach;with chair alarm set Nurse Communication: Mobility status PT Visit Diagnosis: Unsteadiness on feet (R26.81);Muscle weakness (generalized) (M62.81);Difficulty in walking, not elsewhere classified (R26.2)      Time: 7782-4235 PT Time Calculation (min) (ACUTE ONLY): 28 min  Charges:  $Gait Training: 8-22 mins $Therapeutic Activity: 8-22 mins                     Debe Coder PT Acute Rehabilitation Services Pager [phone] Office [phone]    Dustin Duffy 05/01/2022, 12:59 PM

## 2022-05-01 NOTE — Progress Notes (Signed)
Physical Therapy Treatment Patient Details Name: Dustin Duffy MRN: [id number] DOB: July 14, 1955 Today's Date: 05/01/2022   History of Present Illness Patient is a 67 year old male who presented with frequent falls and generalized weakness. CT abdomen/pelvis and CT L-spine "showed minimally displaced sacral fractures at S3 and S4 favored to be acute.  Mildly displaced fractures of posterior left 11th and 12th ribs noted.  T12 wedge compression fracture with 75% height loss favored to be chronic" PMH: urology 04/28/22 with UTI, DM II, HLD, BPH    PT Comments    Pt continues motivated and up to ambulate increased distance at increased pace and negotiated stairs with min assist.  Pt eager for dc home - IND in hospital limited by legal blindness.   Recommendations for follow up therapy are one component of a multi-disciplinary discharge planning process, led by the attending physician.  Recommendations may be updated based on patient status, additional functional criteria and insurance authorization.  Follow Up Recommendations  Home health PT     Assistance Recommended at Discharge Intermittent Supervision/Assistance  Patient can return home with the following A little help with walking and/or transfers;A little help with bathing/dressing/bathroom;Assistance with cooking/housework;Assist for transportation;Help with stairs or ramp for entrance   Equipment Recommendations  None recommended by PT    Recommendations for Other Services       Precautions / Restrictions Precautions Precautions: Fall Precaution Comments: multiple recent falls at home, monitor O2 and HR Restrictions Weight Bearing Restrictions: No     Mobility  Bed Mobility               General bed mobility comments: Pt up in chair and requests back to same    Transfers Overall transfer level: Needs assistance Equipment used: Rolling walker (2 wheels) Transfers: Sit to/from Stand Sit to Stand: Min guard, Supervision            General transfer comment: Steady assist  with cues for LE management and use of UEs to self assist;  Pt sit<>stand x 3    Ambulation/Gait Ambulation/Gait assistance: Min guard, Supervision Gait Distance (Feet): 300 Feet Assistive device: Rolling walker (2 wheels) Gait Pattern/deviations: Step-to pattern, Step-through pattern, Decreased step length - right, Decreased step length - left, Shuffle, Trunk flexed       General Gait Details: min cues for sequence and position from RW   Stairs Stairs: Yes Stairs assistance: Min guard, Min assist Stair Management: Two rails, One rail Right, With cane Number of Stairs: 14 General stair comments: 7 stairs with bilat rails and min guard;  7 stairs with rail, cane, and min assist for balance   Wheelchair Mobility    Modified Rankin (Stroke Patients Only)       Balance Overall balance assessment: Needs assistance Sitting-balance support: No upper extremity supported, Feet supported Sitting balance-Leahy Scale: Fair     Standing balance support: Single extremity supported Standing balance-Leahy Scale: Poor Standing balance comment: leaning back onto surfaces behind patient with toes off the ground without cues to keep down                            Cognition Arousal/Alertness: Awake/alert Behavior During Therapy: WFL for tasks assessed/performed Overall Cognitive Status: Within Functional Limits for tasks assessed                                 General Comments:  continues to have questionable insight to deficits with balance and restrictions on fluids from MD. pleasant and cooperative        Exercises      General Comments        Pertinent Vitals/Pain Pain Assessment Pain Assessment: Faces Faces Pain Scale: Hurts little more Pain Location: ribs, sacrum Pain Descriptors / Indicators: Aching, Sore Pain Intervention(s): Limited activity within patient's tolerance, Monitored during  session, Premedicated before session    Home Living                          Prior Function            PT Goals (current goals can now be found in the care plan section) Acute Rehab PT Goals Patient Stated Goal: HOME to my dog PT Goal Formulation: With patient Time For Goal Achievement: 05/14/22 Potential to Achieve Goals: Good Progress towards PT goals: Progressing toward goals    Frequency    Min 3X/week      PT Plan Current plan remains appropriate    Co-evaluation              AM-PAC PT "6 Clicks" Mobility   Outcome Measure  Help needed turning from your back to your side while in a flat bed without using bedrails?: A Little Help needed moving from lying on your back to sitting on the side of a flat bed without using bedrails?: A Little Help needed moving to and from a bed to a chair (including a wheelchair)?: A Little Help needed standing up from a chair using your arms (e.g., wheelchair or bedside chair)?: A Little Help needed to walk in hospital room?: A Little Help needed climbing 3-5 steps with a railing? : A Little 6 Click Score: 18    End of Session Equipment Utilized During Treatment: Gait belt Activity Tolerance: Patient tolerated treatment well Patient left: in chair;with call bell/phone within reach;with chair alarm set Nurse Communication: Mobility status PT Visit Diagnosis: Unsteadiness on feet (R26.81);Muscle weakness (generalized) (M62.81);Difficulty in walking, not elsewhere classified (R26.2)     Time: 6754-4920 PT Time Calculation (min) (ACUTE ONLY): 26 min  Charges:  $Gait Training: 8-22 mins $Therapeutic Activity: 8-22 mins                     Debe Coder PT Acute Rehabilitation Services Pager [phone] Office [phone]    Calub Tarnow 05/01/2022, 3:53 PM

## 2022-05-01 NOTE — Progress Notes (Signed)
SATURATION QUALIFICATIONS: (This note is used to comply with regulatory documentation for home oxygen)  Patient Saturations on Room Air at Rest = 92 %  Patient Saturations on Room Air while Ambulating = 88%  Patient Saturations on 2 Liters of oxygen while Ambulating = 91%  Please briefly explain why patient needs home oxygen: 

## 2022-05-01 NOTE — Progress Notes (Signed)
Occupational Therapy Treatment Patient Details Name: Dustin Duffy MRN: [id number] DOB: 12-24-1955 Today's Date: 05/01/2022   History of present illness Patient is a 67 year old male who presented with frequent falls and generalized weakness. CT abdomen/pelvis and CT L-spine "showed minimally displaced sacral fractures at S3 and S4 favored to be acute.  Mildly displaced fractures of posterior left 11th and 12th ribs noted.  T12 wedge compression fracture with 75% height loss favored to be chronic" PMH: urology 04/28/22 with UTI, DM II, HLD, BPH   OT comments  Patient was able to participate in ADL tasks sitting EOB. Patient noted to have posterior leaning with toes off the floor with standing during session with min A to min guard to maintain balance. Patient was consistently educated on keeping toes on floor and shifting forwards to prevent posterior drift backwards. Patient is motivated to return home soon. Patient would continue to benefit from skilled OT services at this time while admitted and after d/c to address noted deficits in order to improve overall safety and independence in ADLs.     Recommendations for follow up therapy are one component of a multi-disciplinary discharge planning process, led by the attending physician.  Recommendations may be updated based on patient status, additional functional criteria and insurance authorization.    Follow Up Recommendations  Home health OT     Assistance Recommended at Discharge Frequent or constant Supervision/Assistance  Patient can return home with the following  A little help with walking and/or transfers;Assistance with cooking/housework;Direct supervision/assist for medications management;Assist for transportation;Help with stairs or ramp for entrance;Direct supervision/assist for financial management;A little help with bathing/dressing/bathroom   Equipment Recommendations  None recommended by OT    Recommendations for Other Services       Precautions / Restrictions Precautions Precautions: Fall Precaution Comments: multiple recent falls at home, monitor O2 and HR Restrictions Weight Bearing Restrictions: No       Mobility Bed Mobility Overal bed mobility: Needs Assistance       Supine to sit: Supervision            Balance Overall balance assessment: Needs assistance Sitting-balance support: No upper extremity supported, Feet supported Sitting balance-Leahy Scale: Fair     Standing balance support: Single extremity supported Standing balance-Leahy Scale: Poor Standing balance comment: leaning back onto surfaces behind patient with toes off the ground without cues to keep down           ADL either performed or assessed with clinical judgement   ADL Overall ADL's : Needs assistance/impaired     Grooming: Wash/dry face;Wash/dry hands;Set up;Sitting Grooming Details (indicate cue type and reason): EOB Upper Body Bathing: Set up;Sitting Upper Body Bathing Details (indicate cue type and reason): EOB Lower Body Bathing: Min guard;Set up Lower Body Bathing Details (indicate cue type and reason): set up sitting with education to complete while seated. Upper Body Dressing : Set up;Sitting Upper Body Dressing Details (indicate cue type and reason): EOB Lower Body Dressing: Minimal assistance;Sit to/from stand;Sitting/lateral leans Lower Body Dressing Details (indicate cue type and reason): wit patient noted to place BLE into one leg hole for boxers. patient needed min guard with cues to keep toes on the floor and lean nose over toes. patient leaning back on all surfaces while standing. patient not aware of posture with attempted standing without cues. Toilet Transfer: Ambulation;Rolling walker (2 wheels);Min guard Toilet Transfer Details (indicate cue type and reason): needed RW for balance.  Cognition Arousal/Alertness: Awake/alert Behavior During Therapy: WFL for tasks  assessed/performed Overall Cognitive Status: Within Functional Limits for tasks assessed         General Comments: continues to have poor insight to deficits with balance and restrictions on fluids from MD. plesant and cooperative              General Comments patient reported that friend was watching dog at this time. patient was educated on how it would be better if friend could continue to watch dog for him while he continues to get better. patient reported he would prefer that he had the dog with him but would consider the eduaction. patients HR was 80-98 bpm during ADL tasks. patient was noted to have O2 range from 86% on RA to 93% on RA with activity with rate constantly changing and O2 reader switched on digit. no SOB observed    Pertinent Vitals/ Pain       Pain Assessment Pain Assessment: Faces Faces Pain Scale: Hurts little more Pain Location: ribs, sacrum Pain Descriptors / Indicators: Aching, Sore Pain Intervention(s): Limited activity within patient's tolerance, Monitored during session, Premedicated before session         Frequency  Min 2X/week        Progress Toward Goals  OT Goals(current goals can now be found in the care plan section)  Progress towards OT goals: Progressing toward goals     Plan Discharge plan remains appropriate       AM-PAC OT "6 Clicks" Daily Activity     Outcome Measure   Help from another person eating meals?: A Little Help from another person taking care of personal grooming?: A Little Help from another person toileting, which includes using toliet, bedpan, or urinal?: A Little Help from another person bathing (including washing, rinsing, drying)?: A Little Help from another person to put on and taking off regular upper body clothing?: A Little Help from another person to put on and taking off regular lower body clothing?: A Little 6 Click Score: 18    End of Session Equipment Utilized During Treatment: Rolling walker (2  wheels)  OT Visit Diagnosis: Unsteadiness on feet (R26.81);Other abnormalities of gait and mobility (R26.89);Muscle weakness (generalized) (M62.81)   Activity Tolerance Patient tolerated treatment well   Patient Left in chair;with call bell/phone within reach;Other (comment) (with PT in room)   Nurse Communication Mobility status;Other (comment) (patients request for more coffee)        Time: 9937-1696 OT Time Calculation (min): 17 min  Charges: OT General Charges $OT Visit: 1 Visit OT Treatments $Self Care/Home Management : 8-22 mins  Rennie Plowman, MS Acute Rehabilitation Department Office# [phone]   Willa Rough 05/01/2022, 11:06 AM

## 2022-05-02 DIAGNOSIS — Z978 Presence of other specified devices: Secondary | ICD-10-CM | POA: Diagnosis not present

## 2022-05-02 DIAGNOSIS — S3210XA Unspecified fracture of sacrum, initial encounter for closed fracture: Secondary | ICD-10-CM | POA: Diagnosis not present

## 2022-05-02 DIAGNOSIS — N179 Acute kidney failure, unspecified: Secondary | ICD-10-CM | POA: Diagnosis not present

## 2022-05-02 DIAGNOSIS — S2242XA Multiple fractures of ribs, left side, initial encounter for closed fracture: Secondary | ICD-10-CM | POA: Diagnosis not present

## 2022-05-02 LAB — CBC
HCT: 34.5 % — ABNORMAL LOW (ref 39.0–52.0)
Hemoglobin: 11.3 g/dL — ABNORMAL LOW (ref 13.0–17.0)
MCH: 28 pg (ref 26.0–34.0)
MCHC: 32.8 g/dL (ref 30.0–36.0)
MCV: 85.6 fL (ref 80.0–100.0)
Platelets: 103 10*3/uL — ABNORMAL LOW (ref 150–400)
RBC: 4.03 MIL/uL — ABNORMAL LOW (ref 4.22–5.81)
RDW: 14.5 % (ref 11.5–15.5)
WBC: 6.8 10*3/uL (ref 4.0–10.5)
nRBC: 0 % (ref 0.0–0.2)

## 2022-05-02 LAB — COMPREHENSIVE METABOLIC PANEL
ALT: 39 U/L (ref 0–44)
AST: 35 U/L (ref 15–41)
Albumin: 2.1 g/dL — ABNORMAL LOW (ref 3.5–5.0)
Alkaline Phosphatase: 40 U/L (ref 38–126)
Anion gap: 8 (ref 5–15)
BUN: 35 mg/dL — ABNORMAL HIGH (ref 8–23)
CO2: 22 mmol/L (ref 22–32)
Calcium: 7.5 mg/dL — ABNORMAL LOW (ref 8.9–10.3)
Chloride: 102 mmol/L (ref 98–111)
Creatinine, Ser: 1.07 mg/dL (ref 0.61–1.24)
GFR, Estimated: >60 mL/min (ref >60)
Glucose, Bld: 170 mg/dL — ABNORMAL HIGH (ref 70–99)
Potassium: 3.7 mmol/L (ref 3.5–5.1)
Sodium: 132 mmol/L — ABNORMAL LOW (ref 135–145)
Total Bilirubin: 0.9 mg/dL (ref 0.3–1.2)
Total Protein: 4.7 g/dL — ABNORMAL LOW (ref 6.5–8.1)

## 2022-05-02 LAB — GLUCOSE, CAPILLARY: Glucose-Capillary: 148 mg/dL — ABNORMAL HIGH (ref 70–99)

## 2022-05-02 MED ORDER — SIMVASTATIN 20 MG PO TABS: 20.0000 mg | ORAL_TABLET | Freq: Every evening | ORAL | Status: AC

## 2022-05-02 MED ORDER — FLUCONAZOLE 200 MG PO TABS: 200.0000 mg | ORAL_TABLET | Freq: Every day | ORAL | 0 refills | Status: DC

## 2022-05-02 MED ORDER — CEPHALEXIN 500 MG PO CAPS: 500.0000 mg | ORAL_CAPSULE | Freq: Four times a day (QID) | ORAL | 0 refills | Status: AC

## 2022-05-02 NOTE — TOC Transition Note (Signed)
Transition of Care [facility] - CM/SW Discharge Note   Patient Details  Name: Dustin Duffy MRN: [id number] Date of Birth: 1955-04-27  Transition of Care [facility]) CM/SW Contact:  Lennart Pall, LCSW Phone Number: 05/02/2022, 10:11 AM   Clinical Narrative:    Pt anticipated to dc today per MD.  Orders placed for HHPT and pt choosing [facility] - referral placed/ accepted.  Orders for home O2 and pt has no agency preference.  Referral placed with [employer] and they will deliver portable tank to room and concentrator to home.  Pt notes his sister will provide him with dc transportation home.  No further TOC needs.   Final next level of care: Home w Home Health Services Barriers to Discharge: Barriers Resolved   Patient Goals and CMS Choice CMS Medicare.gov Compare Post Acute Care list provided to:: Patient    Discharge Placement                         Discharge Plan and Services Additional resources added to the After Visit Summary for     Discharge Planning Services: CM Consult Post Acute Care Choice: Home Health          DME Arranged: Oxygen DME Agency: [employer] Date DME Agency Contacted: 05/02/22 Time DME Agency Contacted: [id number] Representative spoke with at DME Agency: Brenton [facility]ills HH Arranged: PT [place]: [facility] [place] Date [place]: 05/02/22 Time [place]: 1011 Representative spoke with at [place]: [place] Determinants of Health ([place]) Interventions [place]: No Food Insecurity (05/01/2022)  Housing: Low Risk  (05/01/2022)  Transportation Needs: No Transportation Needs (05/01/2022)  Utilities: Not At Risk (05/01/2022)  Depression (PHQ2-9): Low Risk  (04/02/2021)  Tobacco Use: Medium Risk (04/29/2022)     Readmission Risk Interventions    05/02/2022   10:09 AM  Readmission Risk Prevention Plan  Transportation Screening Complete  PCP or Specialist Appt within 5-7 Days Complete  Home Care Screening Complete   Medication Review (RN CM) Complete

## 2022-05-02 NOTE — Discharge Summary (Signed)
Physician Discharge Summary   Patient: Dustin Duffy MRN: [id number] DOB: Mar 14, 1956  Admit date:     04/29/2022  Discharge date: 05/02/22  Discharge Physician: Murray Hodgkins   PCP: Janie Morning, DO   Recommendations at discharge:   Hypoxia asymptomatic, no acute hypoxic respiratory failure. Home on oxygen   Frequent falls with injury Acute minimally displaced S3 and S4 sacral fractures T12 wedge compression fracture, chronic: HHPTOT   Essential hypertension: Continue Lopressor.   Hyperlipidemia: Holding statin with elevated LFTs.   Mild hyponatremia   Thrombocytopenia   Discharge Diagnoses: Principal Problem:   Acute kidney injury ([place]) Active Problems:   Diabetes mellitus type 2 in nonobese (HCC)   Dyslipidemia   Essential hypertension   BPH with urinary obstruction   Foley catheter in place prior to arrival   Frequent falls   Multiple fractures of ribs, left side, initial encounter for closed fracture   Sacral fracture, closed ([place])   Closed wedge compression fracture of T12 vertebra (HCC)   UTI (urinary tract infection)  Resolved Problems:   * No resolved hospital problems. *  Hospital Course: 67 year old man PMH including diabetes, BPH with chronic urinary retention and indwelling Foley catheter, legally blind, lives alone presented with frequent falls, generalized weakness.  [facility] and started on antibiotic for UTI.  Acute kidney injury: Now resolved. Possibly related to chronic urinary obstruction given findings (postrenal).  Does not drink a lot of fluid at home.   CT no evidence of obstruction or hydronephrosis. Bactrim discontinued. Xigduo on hold Mild rhabdomyolysis improved. Expect spontaneous resolution.  UOP 3450 Creatinine down to 1.07, BUN trending down, 35.   UTI with hematuria in setting of BPH with chronic indwelling Foley catheter: Diagnosed with UTI by his urologist on 2/1 and started on course of Bactrim which  has been held due to AKI.  Foley exchanged on 2/1. S/p ceftriaxone x2. Change to Keflex.  Continue Flomax, Foley care.  UC 30k yeast. Continue Diflucan   Mixed pattern elevated LFTs: Etiology unclear.  Transaminases are trending down as is total bilirubin.  Imaging showed distended gallbladder with sludge but no acute cholecystitis.  Patient has no abdominal tenderness.  Imaging also suggested the possibility of acute hepatitis.  Hepatitis panel is negative. Since the patient has no abdominal pain, is asymptomatic and LFTs are trending down we will monitor. CK trended down AST now normal ALT now normal   Hypoxia asymptomatic, no acute hypoxic respiratory failure. Acute mildly displaced posterior left 11th and 12th rib fractures: Secondary from hypoventilation, atelectasis.  SpO2 dropped to 88% while in the ED and on floor, stable on 2 L O2 via [place].  He denies any shortness of breath.  CXR otherwise without infiltrate or consolidation. Per nursing: Patient Saturations on Room Air at Rest = 92%   Patient Saturations on [employer] while Ambulating = 88%   Patient Saturations on 2 Liters of oxygen while Ambulating = 91% Home on home oxygen   Frequent falls with injury Acute minimally displaced S3 and S4 sacral fractures T12 wedge compression fracture, chronic: Numerous falls at home.  Found to have acute sacral fractures and left 11th and 12th rib fractures as above otherwise no other significant injury. Supportive care. HHPTOT   Type 2 diabetes: Hgb A1c 7.9 CBG stable. SSI.   Essential hypertension: Continue Lopressor.   Hyperlipidemia:   Mild hyponatremia No offending medications noted.  Sodium stable, mildly low. Follow-up as an outpatient.   Thrombocytopenia Plts 103, stable.  Follow-up as an outpatient.   Aortic Atherosclerosis  Follow-up as an outpatient.   Consultants:  None  Procedures performed:  None   Disposition: Home health Diet recommendation:  Cardiac and  Carb modified diet DISCHARGE MEDICATION: Allergies as of 05/02/2022   No Known Allergies      Medication List     STOP taking these medications    insulin aspart 100 unit/mL injection Commonly known as: novoLOG       TAKE these medications    amitriptyline 50 MG tablet Commonly known as: ELAVIL Take 50 mg by mouth at bedtime.   aspirin EC 81 MG tablet Take 81 mg by mouth in the morning. Swallow whole.   cephALEXin 500 MG capsule Commonly known as: KEFLEX Take 1 capsule (500 mg total) by mouth 4 (four) times daily for 7 days.   cholecalciferol 25 MCG (1000 UNIT) tablet Commonly known as: VITAMIN D3 Take 1,000 Units by mouth 3 (three) times a week.   fluconazole 200 MG tablet Commonly known as: DIFLUCAN Take 1 tablet (200 mg total) by mouth daily. Start taking on: May 03, 2022   gabapentin 300 MG capsule Commonly known as: NEURONTIN Take 300 mg by mouth 3 (three) times daily.   glipiZIDE 2.5 MG 24 hr tablet Commonly known as: GLUCOTROL XL Take 2.5 mg by mouth daily with breakfast. What changed: Another medication with the same name was removed. Continue taking this medication, and follow the directions you see here.   ipratropium 0.03 % nasal spray Commonly known as: ATROVENT Place 2 sprays into both nostrils 2 (two) times daily as needed for rhinitis.   metoprolol tartrate 25 MG tablet Commonly known as: LOPRESSOR Take 1 tablet (25 mg total) by mouth 2 (two) times daily.   Rybelsus 14 MG Tabs Generic drug: Semaglutide Take 14 mg by mouth daily before breakfast.   simvastatin 20 MG tablet Commonly known as: ZOCOR Take 1 tablet (20 mg total) by mouth every evening. Do not take this medication until you finish Diflucan. What changed: additional instructions   tamsulosin 0.4 MG Caps capsule Commonly known as: FLOMAX Take 0.4 mg by mouth 2 (two) times daily.   Xigduo XR 10-500 MG Tb24 Generic drug: Dapagliflozin Pro-metFORMIN ER Take 1 tablet by  mouth daily before breakfast.               Durable Medical Equipment  (From admission, onward)           Start     Ordered   05/02/22 0947  For home use only DME oxygen  Once       Question Answer Comment  Length of Need 6 Months   Mode or (Route) Mask   Liters per Minute 2   Frequency Continuous (stationary and portable oxygen unit needed)   Oxygen delivery system Gas      05/02/22 0946            Follow-up Information     [facility]   Specialty: [place] Why: to provide home physical therapy visits Contact information: [address] [place] [address] [phone]         Janie Morning, DO. Schedule an appointment as soon as possible for a visit in 1 week(s).   Specialty: Family Medicine Contact information: [address] [place] [address] [phone]                Feels ok  Discharge Exam:  Filed Weights   04/29/22 1620 04/30/22 0500 05/02/22 0500  Weight: 70 kg 60.6 kg 63.9 kg   Physical Exam Vitals reviewed.  Constitutional:      General: He is not in acute distress.    Appearance: He is not ill-appearing or toxic-appearing.  Cardiovascular:     Rate and Rhythm: Normal rate and regular rhythm.     Heart sounds: No murmur heard. Pulmonary:     Effort: Pulmonary effort is normal. No respiratory distress.     Breath sounds: No wheezing, rhonchi or rales.  Neurological:     Mental Status: He is alert.  Psychiatric:        Mood and Affect: Mood normal.        Behavior: Behavior normal.    Condition at discharge: good  The results of significant diagnostics from this hospitalization (including imaging, microbiology, ancillary and laboratory) are listed below for reference.   Imaging Studies: US Abdomen Limited RUQ (LIVER/GB)  Result Date: 04/29/2022 CLINICAL DATA:  Elevated serum bilirubin EXAM: ULTRASOUND ABDOMEN LIMITED RIGHT UPPER QUADRANT COMPARISON:  None  Available. FINDINGS: Gallbladder: The gallbladder is distended and sludge filled. Gallbladder wall, however, is not thickened and no pericholecystic fluid is identified. The sonographic Percell Miller sign is reportedly negative. Common bile duct: Diameter: 6 mm in proximal diameter Liver: There is increased echogenicity of the portal triads, best appreciated on image # [id number]. This is a nonspecific finding that can be seen the setting of acute hepatitis. No focal intrahepatic masses or intrahepatic biliary duct dilation is identified. Portal vein is patent on color Doppler imaging with normal direction of blood flow towards the liver. Other: None. IMPRESSION: 1. Distended gallbladder with sludge. No sonographic evidence of acute cholecystitis. 2. Increased echogenicity of the portal triads. This is a nonspecific finding that can be seen the setting of acute hepatitis. Correlation with liver enzymes is recommended for further evaluation. Electronically Signed   By: Fidela Salisbury M.D.   On: 04/29/2022 22:06   CT ABDOMEN PELVIS WO CONTRAST  Result Date: 04/29/2022 CLINICAL DATA:  Fall with abdominal trauma EXAM: CT ABDOMEN AND PELVIS WITHOUT CONTRAST TECHNIQUE: Multidetector CT imaging of the abdomen and pelvis was performed following the standard protocol without IV contrast. RADIATION DOSE REDUCTION: This exam was performed according to the departmental dose-optimization program which includes automated exposure control, adjustment of the mA and/or kV according to patient size and/or use of iterative reconstruction technique. COMPARISON:  None Available. FINDINGS: Lower chest: Bibasilar dependent atelectasis. Coronary artery calcifications. Hepatobiliary: Distended gallbladder. No pericholecystic inflammatory change or cholelithiasis. Unremarkable unenhanced appearance of the liver. Pancreas: Mild fatty atrophy of the pancreas. Spleen: Normal Adrenals/Urinary Tract: Normal adrenal glands. Both kidneys are normal. No  ureteral obstruction or hydronephrosis. There is a Foley catheter within the urinary bladder. Stomach/Bowel: Normal stomach and duodenum. No dilated small bowel. Sigmoid diverticulosis without acute diverticulitis. Normal appendix. Vascular/Lymphatic: Calcific aortic atherosclerosis. No lymphadenopathy. Reproductive: Mildly enlarged prostate gland measures 5.8 cm. Other: Negative Musculoskeletal: Mildly displaced fractures of the posterior left eleventh and twelfth ribs. T12 wedge compression fracture with 75% height loss Minimally displaced fractures of the sacrum at the S3 and S4 levels. IMPRESSION: 1. Minimally displaced sacral fractures at the S3 and S4 levels (which were outside the field of view of the lumbar spine CT). These are favored to be acute. 2. Mildly displaced fractures of the posterior left eleventh and twelfth ribs. 3. T12 wedge compression fracture with 75% height loss, favored to be chronic. 4. Distended gallbladder  without pericholecystic inflammatory change or cholelithiasis. 5. Sigmoid diverticulosis without acute diverticulitis. Aortic Atherosclerosis (ICD10-I70.0). Electronically Signed   By: Ulyses Jarred M.D.   On: 04/29/2022 20:16   CT L-SPINE NO CHARGE  Result Date: 04/29/2022 CLINICAL DATA:  Fall EXAM: CT LUMBAR SPINE WITHOUT CONTRAST TECHNIQUE: Multidetector CT imaging of the lumbar spine was performed without intravenous contrast administration. Multiplanar CT image reconstructions were also generated. RADIATION DOSE REDUCTION: This exam was performed according to the departmental dose-optimization program which includes automated exposure control, adjustment of the mA and/or kV according to patient size and/or use of iterative reconstruction technique. COMPARISON:  None Available. FINDINGS: Segmentation: 5 lumbar type vertebrae. Alignment: Normal. Vertebrae: Wedge compression fracture of T12 with 75% height loss appears to be chronic but is strictly age indeterminate. Large  superior endplate Schmorl's node at L4. Paraspinal and other soft tissues: Calcific aortic atherosclerosis. Mildly displaced fracture of the left twelfth rib. Disc levels: Multilevel degenerative disc disease and facet arthrosis without spinal canal stenosis or neural impingement. IMPRESSION: 1. Wedge compression fracture of T12 with 75% height loss, favored to be chronic. 2. Mildly displaced fracture of the left twelfth rib. Aortic Atherosclerosis (ICD10-I70.0). Electronically Signed   By: Ulyses Jarred M.D.   On: 04/29/2022 20:09   DG Lumbar Spine Complete  Result Date: 04/29/2022 CLINICAL DATA:  Fall last night.  Pain.  Questionable sepsis. EXAM: LUMBAR SPINE - COMPLETE 4+ VIEW COMPARISON:  None Available. FINDINGS: There is diffuse decreased bone mineralization. There is a minimally displaced acute posterior left twelfth rib fracture with approximately 2 mm cortical step-off. There are 5 non-rib-bearing lumbar-type vertebral bodies. There is moderate to severe mid and anterior T12 vertebral body height loss with approximately 5 mm retropulsion of the posterosuperior T12 vertebral body. Mild posterior L5-S1 disc space narrowing. IMPRESSION: 1. Minimally displaced acute posterior left twelfth rib fracture. 2. Moderate to severe mid and anterior T12 vertebral body height loss with approximately 5 mm retropulsion of the posterosuperior T12 vertebral body. This is age indeterminate, and recommend correlation for point tenderness. There may be an acute fracture lucency at the superior aspect of the T12 vertebral body, and the acute fracture of the posterior left twelfth rib raises suspicion for an acute compression fracture component given this is at the same level. Electronically Signed   By: Yvonne Kendall M.D.   On: 04/29/2022 14:13   DG Chest 1 View  Result Date: 04/29/2022 CLINICAL DATA:  Fall last night.  Pain. EXAM: CHEST  1 VIEW COMPARISON:  AP chest 02/11/2021 FINDINGS: Cardiac silhouette and mediastinal  contours are within normal limits. Mild calcification within the aortic arch. There is improved aeration of the inferior right lung with resolution of prior linear atelectasis. Left basilar curvilinear scarring appears unchanged from 02/11/2021. No definite acute airspace opacity is seen. No definite pleural effusion is seen. No pneumothorax. There is a minimally displaced acute posterior left twelfth rib fracture, better seen on contemporaneous lumbar spine radiographs. IMPRESSION: 1. Improved aeration of the inferior right lung with resolution of prior linear atelectasis. 2. No definite acute lung process. 3. Minimally displaced acute posterior left twelfth rib fracture, better seen on contemporaneous lumbar spine radiographs. Electronically Signed   By: Yvonne Kendall M.D.   On: 04/29/2022 14:10   CT Head Wo Contrast  Result Date: 04/29/2022 CLINICAL DATA:  Multiple falls EXAM: CT HEAD WITHOUT CONTRAST CT CERVICAL SPINE WITHOUT CONTRAST TECHNIQUE: Multidetector CT imaging of the head and cervical spine was performed following the standard protocol  without intravenous contrast. Multiplanar CT image reconstructions of the cervical spine were also generated. RADIATION DOSE REDUCTION: This exam was performed according to the departmental dose-optimization program which includes automated exposure control, adjustment of the mA and/or kV according to patient size and/or use of iterative reconstruction technique. COMPARISON:  02/09/2021 FINDINGS: CT HEAD FINDINGS Brain: No evidence of acute infarction, hemorrhage, hydrocephalus, extra-axial collection or mass lesion/mass effect. Vascular: No hyperdense vessel or unexpected calcification. Skull: Normal. Negative for fracture or focal lesion. Sinuses/Orbits: No acute finding. Other: None. CT CERVICAL SPINE FINDINGS Alignment: Degenerative straightening of the normal cervical lordosis. Skull base and vertebrae: No acute fracture. No primary bone lesion or focal  pathologic process. Soft tissues and spinal canal: No prevertebral fluid or swelling. No visible canal hematoma. Disc levels: Severe multilevel disc space height loss and osteophytosis, worst from C4-C6. Upper chest: Negative. Other: None. IMPRESSION: 1. No acute intracranial pathology. 2. No fracture or static subluxation of the cervical spine. 3. Severe multilevel cervical disc degenerative disease. Electronically Signed   By: Delanna Ahmadi M.D.   On: 04/29/2022 13:41   CT Cervical Spine Wo Contrast  Result Date: 04/29/2022 CLINICAL DATA:  Multiple falls EXAM: CT HEAD WITHOUT CONTRAST CT CERVICAL SPINE WITHOUT CONTRAST TECHNIQUE: Multidetector CT imaging of the head and cervical spine was performed following the standard protocol without intravenous contrast. Multiplanar CT image reconstructions of the cervical spine were also generated. RADIATION DOSE REDUCTION: This exam was performed according to the departmental dose-optimization program which includes automated exposure control, adjustment of the mA and/or kV according to patient size and/or use of iterative reconstruction technique. COMPARISON:  02/09/2021 FINDINGS: CT HEAD FINDINGS Brain: No evidence of acute infarction, hemorrhage, hydrocephalus, extra-axial collection or mass lesion/mass effect. Vascular: No hyperdense vessel or unexpected calcification. Skull: Normal. Negative for fracture or focal lesion. Sinuses/Orbits: No acute finding. Other: None. CT CERVICAL SPINE FINDINGS Alignment: Degenerative straightening of the normal cervical lordosis. Skull base and vertebrae: No acute fracture. No primary bone lesion or focal pathologic process. Soft tissues and spinal canal: No prevertebral fluid or swelling. No visible canal hematoma. Disc levels: Severe multilevel disc space height loss and osteophytosis, worst from C4-C6. Upper chest: Negative. Other: None. IMPRESSION: 1. No acute intracranial pathology. 2. No fracture or static subluxation of the  cervical spine. 3. Severe multilevel cervical disc degenerative disease. Electronically Signed   By: Delanna Ahmadi M.D.   On: 04/29/2022 13:41    Microbiology: Results for orders placed or performed during the hospital encounter of 04/29/22  Blood Culture (routine x 2)     Status: None (Preliminary result)   Collection Time: 04/29/22  2:15 PM   Specimen: BLOOD  Result Value Ref Range Status   Specimen Description   Final    BLOOD LEFT ANTECUBITAL Performed at [facility] [address], [place], [place] [address]    Special Requests   Final    BOTTLES DRAWN AEROBIC AND ANAEROBIC Blood Culture adequate volume Performed at [facility] [address] [place], [place] [address]    Culture   Final    NO GROWTH 3 DAYS Performed at [facility], [place] [address] [place], [place] [address]    Report Status PENDING  Incomplete  Blood Culture (routine x 2)     Status: None (Preliminary result)   Collection Time: 04/29/22  2:30 PM   Specimen: BLOOD  Result Value Ref Range Status   Specimen Description   Final  BLOOD RIGHT ANTECUBITAL Performed at [facility] [address] [place], [place] [address]    Special Requests   Final    BOTTLES DRAWN AEROBIC AND ANAEROBIC Blood Culture results may not be optimal due to an excessive volume of blood received in culture bottles Performed at [facility] [address], [place], [place] [address]    Culture   Final    NO GROWTH 3 DAYS Performed at [facility], [place] [address] [place], [place] [address]    Report Status PENDING  Incomplete  Culture, Urine (Do not remove urinary catheter, catheter placed by urology or difficult to place)     Status: Abnormal   Collection Time: 04/29/22  6:55 PM   Specimen: Urine, Catheterized  Result Value Ref Range Status   Specimen Description   Final    URINE, CATHETERIZED Performed at [facility] [address] [place], [place] [address]    Special Requests   Final    NONE Performed at [facility], [place] [address], [place], [place] [address]    Culture 30,000 COLONIES/mL YEAST (A)  Final   Report Status 05/01/2022 FINAL  Final    Labs: CBC: Recent Labs  Lab 04/29/22 1413 04/30/22 0444 05/01/22 0913 05/02/22 0407  WBC 12.4* 8.5 7.3 6.8  NEUTROABS 10.5*  --   --   --   HGB 16.2 12.3* 12.6* 11.3*  HCT 49.8 37.2* 40.1 34.5*  MCV 84.8 84.5 89.7 85.6  PLT 160 128* 105* 662*   Basic Metabolic Panel: Recent Labs  Lab 04/29/22 1413 04/30/22 0444 05/01/22 0913 05/02/22 0407  NA 133* 131* 134* 132*  K 4.5 4.1 4.3 3.7  CL 95* 99 103 102  CO2 23 20* 18* 22  GLUCOSE 123* 110* 130* 170*  BUN 71* 73* 43* 35*  CREATININE 3.70* 2.74* 1.36* 1.07  CALCIUM 8.6* 7.6* 7.6* 7.5*   Liver Function Tests: Recent Labs  Lab 04/29/22 1413 04/30/22 0444 05/01/22 0913 05/02/22 0407  AST 253* 115* 52* 35  ALT 73* 50* 45* 39  ALKPHOS 73 50 49 40  BILITOT 2.2* 1.5* 1.2 0.9  PROT 7.6 5.3* 5.5* 4.7*  ALBUMIN 3.9 2.6* 2.5* 2.1*   CBG: Recent Labs  Lab 05/01/22 0731 05/01/22 1131 05/01/22 1537 05/01/22 2128 05/02/22 0731  GLUCAP 120* 151* 273* 192* 148*    Discharge time spent: greater than 30 minutes.  Signed: Murray Hodgkins, MD [facility] 05/02/2022

## 2022-05-02 NOTE — Progress Notes (Signed)
Discharge instructions given to patient and all questions were answered.  

## 2022-05-03 DIAGNOSIS — S22080D Wedge compression fracture of T11-T12 vertebra, subsequent encounter for fracture with routine healing: Secondary | ICD-10-CM | POA: Diagnosis not present

## 2022-05-03 DIAGNOSIS — Z792 Long term (current) use of antibiotics: Secondary | ICD-10-CM | POA: Diagnosis not present

## 2022-05-03 DIAGNOSIS — Z9181 History of falling: Secondary | ICD-10-CM | POA: Diagnosis not present

## 2022-05-03 DIAGNOSIS — Z9981 Dependence on supplemental oxygen: Secondary | ICD-10-CM | POA: Diagnosis not present

## 2022-05-03 DIAGNOSIS — Z556 Problems related to health literacy: Secondary | ICD-10-CM | POA: Diagnosis not present

## 2022-05-03 DIAGNOSIS — E785 Hyperlipidemia, unspecified: Secondary | ICD-10-CM | POA: Diagnosis not present

## 2022-05-03 DIAGNOSIS — I700 Atherosclerosis of aorta: Secondary | ICD-10-CM | POA: Diagnosis not present

## 2022-05-03 DIAGNOSIS — N390 Urinary tract infection, site not specified: Secondary | ICD-10-CM | POA: Diagnosis not present

## 2022-05-03 DIAGNOSIS — Z87891 Personal history of nicotine dependence: Secondary | ICD-10-CM | POA: Diagnosis not present

## 2022-05-03 DIAGNOSIS — H548 Legal blindness, as defined in USA: Secondary | ICD-10-CM | POA: Diagnosis not present

## 2022-05-03 DIAGNOSIS — Z466 Encounter for fitting and adjustment of urinary device: Secondary | ICD-10-CM | POA: Diagnosis not present

## 2022-05-03 DIAGNOSIS — N138 Other obstructive and reflux uropathy: Secondary | ICD-10-CM | POA: Diagnosis not present

## 2022-05-03 DIAGNOSIS — N179 Acute kidney failure, unspecified: Secondary | ICD-10-CM | POA: Diagnosis not present

## 2022-05-03 DIAGNOSIS — M5030 Other cervical disc degeneration, unspecified cervical region: Secondary | ICD-10-CM | POA: Diagnosis not present

## 2022-05-03 DIAGNOSIS — S3219XD Other fracture of sacrum, subsequent encounter for fracture with routine healing: Secondary | ICD-10-CM | POA: Diagnosis not present

## 2022-05-03 DIAGNOSIS — Z7984 Long term (current) use of oral hypoglycemic drugs: Secondary | ICD-10-CM | POA: Diagnosis not present

## 2022-05-03 DIAGNOSIS — E11319 Type 2 diabetes mellitus with unspecified diabetic retinopathy without macular edema: Secondary | ICD-10-CM | POA: Diagnosis not present

## 2022-05-03 DIAGNOSIS — S2242XD Multiple fractures of ribs, left side, subsequent encounter for fracture with routine healing: Secondary | ICD-10-CM | POA: Diagnosis not present

## 2022-05-03 DIAGNOSIS — I10 Essential (primary) hypertension: Secondary | ICD-10-CM | POA: Diagnosis not present

## 2022-05-03 DIAGNOSIS — N401 Enlarged prostate with lower urinary tract symptoms: Secondary | ICD-10-CM | POA: Diagnosis not present

## 2022-05-04 LAB — CULTURE, BLOOD (ROUTINE X 2)
Culture: NO GROWTH
Culture: NO GROWTH
Special Requests: ADEQUATE

## 2022-05-09 DIAGNOSIS — Z466 Encounter for fitting and adjustment of urinary device: Secondary | ICD-10-CM | POA: Diagnosis not present

## 2022-05-09 DIAGNOSIS — N401 Enlarged prostate with lower urinary tract symptoms: Secondary | ICD-10-CM | POA: Diagnosis not present

## 2022-05-09 DIAGNOSIS — S2242XD Multiple fractures of ribs, left side, subsequent encounter for fracture with routine healing: Secondary | ICD-10-CM | POA: Diagnosis not present

## 2022-05-09 DIAGNOSIS — N138 Other obstructive and reflux uropathy: Secondary | ICD-10-CM | POA: Diagnosis not present

## 2022-05-09 DIAGNOSIS — N179 Acute kidney failure, unspecified: Secondary | ICD-10-CM | POA: Diagnosis not present

## 2022-05-09 DIAGNOSIS — N390 Urinary tract infection, site not specified: Secondary | ICD-10-CM | POA: Diagnosis not present

## 2022-05-10 DIAGNOSIS — Z466 Encounter for fitting and adjustment of urinary device: Secondary | ICD-10-CM | POA: Diagnosis not present

## 2022-05-10 DIAGNOSIS — S2242XD Multiple fractures of ribs, left side, subsequent encounter for fracture with routine healing: Secondary | ICD-10-CM | POA: Diagnosis not present

## 2022-05-10 DIAGNOSIS — N138 Other obstructive and reflux uropathy: Secondary | ICD-10-CM | POA: Diagnosis not present

## 2022-05-10 DIAGNOSIS — N401 Enlarged prostate with lower urinary tract symptoms: Secondary | ICD-10-CM | POA: Diagnosis not present

## 2022-05-10 DIAGNOSIS — N179 Acute kidney failure, unspecified: Secondary | ICD-10-CM | POA: Diagnosis not present

## 2022-05-10 DIAGNOSIS — N390 Urinary tract infection, site not specified: Secondary | ICD-10-CM | POA: Diagnosis not present

## 2022-05-13 DIAGNOSIS — N179 Acute kidney failure, unspecified: Secondary | ICD-10-CM | POA: Diagnosis not present

## 2022-05-13 DIAGNOSIS — N390 Urinary tract infection, site not specified: Secondary | ICD-10-CM | POA: Diagnosis not present

## 2022-05-13 DIAGNOSIS — N401 Enlarged prostate with lower urinary tract symptoms: Secondary | ICD-10-CM | POA: Diagnosis not present

## 2022-05-13 DIAGNOSIS — Z466 Encounter for fitting and adjustment of urinary device: Secondary | ICD-10-CM | POA: Diagnosis not present

## 2022-05-13 DIAGNOSIS — S2242XD Multiple fractures of ribs, left side, subsequent encounter for fracture with routine healing: Secondary | ICD-10-CM | POA: Diagnosis not present

## 2022-05-13 DIAGNOSIS — N138 Other obstructive and reflux uropathy: Secondary | ICD-10-CM | POA: Diagnosis not present

## 2022-05-16 DIAGNOSIS — N179 Acute kidney failure, unspecified: Secondary | ICD-10-CM | POA: Diagnosis not present

## 2022-05-16 DIAGNOSIS — N401 Enlarged prostate with lower urinary tract symptoms: Secondary | ICD-10-CM | POA: Diagnosis not present

## 2022-05-16 DIAGNOSIS — Z466 Encounter for fitting and adjustment of urinary device: Secondary | ICD-10-CM | POA: Diagnosis not present

## 2022-05-16 DIAGNOSIS — S2242XD Multiple fractures of ribs, left side, subsequent encounter for fracture with routine healing: Secondary | ICD-10-CM | POA: Diagnosis not present

## 2022-05-16 DIAGNOSIS — N138 Other obstructive and reflux uropathy: Secondary | ICD-10-CM | POA: Diagnosis not present

## 2022-05-16 DIAGNOSIS — N390 Urinary tract infection, site not specified: Secondary | ICD-10-CM | POA: Diagnosis not present

## 2022-05-17 DIAGNOSIS — N179 Acute kidney failure, unspecified: Secondary | ICD-10-CM | POA: Diagnosis not present

## 2022-05-17 DIAGNOSIS — S2242XD Multiple fractures of ribs, left side, subsequent encounter for fracture with routine healing: Secondary | ICD-10-CM | POA: Diagnosis not present

## 2022-05-17 DIAGNOSIS — N390 Urinary tract infection, site not specified: Secondary | ICD-10-CM | POA: Diagnosis not present

## 2022-05-17 DIAGNOSIS — N401 Enlarged prostate with lower urinary tract symptoms: Secondary | ICD-10-CM | POA: Diagnosis not present

## 2022-05-17 DIAGNOSIS — Z466 Encounter for fitting and adjustment of urinary device: Secondary | ICD-10-CM | POA: Diagnosis not present

## 2022-05-17 DIAGNOSIS — N138 Other obstructive and reflux uropathy: Secondary | ICD-10-CM | POA: Diagnosis not present

## 2022-05-23 DIAGNOSIS — E1122 Type 2 diabetes mellitus with diabetic chronic kidney disease: Secondary | ICD-10-CM | POA: Diagnosis not present

## 2022-05-24 DIAGNOSIS — N179 Acute kidney failure, unspecified: Secondary | ICD-10-CM | POA: Diagnosis not present

## 2022-05-24 DIAGNOSIS — Z466 Encounter for fitting and adjustment of urinary device: Secondary | ICD-10-CM | POA: Diagnosis not present

## 2022-05-24 DIAGNOSIS — N401 Enlarged prostate with lower urinary tract symptoms: Secondary | ICD-10-CM | POA: Diagnosis not present

## 2022-05-24 DIAGNOSIS — N390 Urinary tract infection, site not specified: Secondary | ICD-10-CM | POA: Diagnosis not present

## 2022-05-24 DIAGNOSIS — N138 Other obstructive and reflux uropathy: Secondary | ICD-10-CM | POA: Diagnosis not present

## 2022-05-24 DIAGNOSIS — S2242XD Multiple fractures of ribs, left side, subsequent encounter for fracture with routine healing: Secondary | ICD-10-CM | POA: Diagnosis not present

## 2022-05-25 DIAGNOSIS — N179 Acute kidney failure, unspecified: Secondary | ICD-10-CM | POA: Diagnosis not present

## 2022-05-25 DIAGNOSIS — N390 Urinary tract infection, site not specified: Secondary | ICD-10-CM | POA: Diagnosis not present

## 2022-05-25 DIAGNOSIS — N401 Enlarged prostate with lower urinary tract symptoms: Secondary | ICD-10-CM | POA: Diagnosis not present

## 2022-05-25 DIAGNOSIS — N138 Other obstructive and reflux uropathy: Secondary | ICD-10-CM | POA: Diagnosis not present

## 2022-05-25 DIAGNOSIS — Z466 Encounter for fitting and adjustment of urinary device: Secondary | ICD-10-CM | POA: Diagnosis not present

## 2022-05-25 DIAGNOSIS — S2242XD Multiple fractures of ribs, left side, subsequent encounter for fracture with routine healing: Secondary | ICD-10-CM | POA: Diagnosis not present

## 2022-05-26 DIAGNOSIS — N401 Enlarged prostate with lower urinary tract symptoms: Secondary | ICD-10-CM | POA: Diagnosis not present

## 2022-05-26 DIAGNOSIS — S2242XD Multiple fractures of ribs, left side, subsequent encounter for fracture with routine healing: Secondary | ICD-10-CM | POA: Diagnosis not present

## 2022-05-26 DIAGNOSIS — Z466 Encounter for fitting and adjustment of urinary device: Secondary | ICD-10-CM | POA: Diagnosis not present

## 2022-05-26 DIAGNOSIS — N179 Acute kidney failure, unspecified: Secondary | ICD-10-CM | POA: Diagnosis not present

## 2022-05-26 DIAGNOSIS — N390 Urinary tract infection, site not specified: Secondary | ICD-10-CM | POA: Diagnosis not present

## 2022-05-26 DIAGNOSIS — N138 Other obstructive and reflux uropathy: Secondary | ICD-10-CM | POA: Diagnosis not present

## 2022-05-30 DIAGNOSIS — N401 Enlarged prostate with lower urinary tract symptoms: Secondary | ICD-10-CM | POA: Diagnosis not present

## 2022-05-30 DIAGNOSIS — S2242XD Multiple fractures of ribs, left side, subsequent encounter for fracture with routine healing: Secondary | ICD-10-CM | POA: Diagnosis not present

## 2022-05-30 DIAGNOSIS — N390 Urinary tract infection, site not specified: Secondary | ICD-10-CM | POA: Diagnosis not present

## 2022-05-30 DIAGNOSIS — Z466 Encounter for fitting and adjustment of urinary device: Secondary | ICD-10-CM | POA: Diagnosis not present

## 2022-05-30 DIAGNOSIS — N179 Acute kidney failure, unspecified: Secondary | ICD-10-CM | POA: Diagnosis not present

## 2022-05-30 DIAGNOSIS — N138 Other obstructive and reflux uropathy: Secondary | ICD-10-CM | POA: Diagnosis not present

## 2022-05-31 DIAGNOSIS — N390 Urinary tract infection, site not specified: Secondary | ICD-10-CM | POA: Diagnosis not present

## 2022-05-31 DIAGNOSIS — Z466 Encounter for fitting and adjustment of urinary device: Secondary | ICD-10-CM | POA: Diagnosis not present

## 2022-05-31 DIAGNOSIS — N179 Acute kidney failure, unspecified: Secondary | ICD-10-CM | POA: Diagnosis not present

## 2022-05-31 DIAGNOSIS — S2242XD Multiple fractures of ribs, left side, subsequent encounter for fracture with routine healing: Secondary | ICD-10-CM | POA: Diagnosis not present

## 2022-05-31 DIAGNOSIS — N138 Other obstructive and reflux uropathy: Secondary | ICD-10-CM | POA: Diagnosis not present

## 2022-05-31 DIAGNOSIS — N401 Enlarged prostate with lower urinary tract symptoms: Secondary | ICD-10-CM | POA: Diagnosis not present

## 2022-06-02 DIAGNOSIS — I10 Essential (primary) hypertension: Secondary | ICD-10-CM | POA: Diagnosis not present

## 2022-06-02 DIAGNOSIS — Z7984 Long term (current) use of oral hypoglycemic drugs: Secondary | ICD-10-CM | POA: Diagnosis not present

## 2022-06-02 DIAGNOSIS — H548 Legal blindness, as defined in USA: Secondary | ICD-10-CM | POA: Diagnosis not present

## 2022-06-02 DIAGNOSIS — Z466 Encounter for fitting and adjustment of urinary device: Secondary | ICD-10-CM | POA: Diagnosis not present

## 2022-06-02 DIAGNOSIS — S22080D Wedge compression fracture of T11-T12 vertebra, subsequent encounter for fracture with routine healing: Secondary | ICD-10-CM | POA: Diagnosis not present

## 2022-06-02 DIAGNOSIS — N390 Urinary tract infection, site not specified: Secondary | ICD-10-CM | POA: Diagnosis not present

## 2022-06-02 DIAGNOSIS — Z87891 Personal history of nicotine dependence: Secondary | ICD-10-CM | POA: Diagnosis not present

## 2022-06-02 DIAGNOSIS — S2242XD Multiple fractures of ribs, left side, subsequent encounter for fracture with routine healing: Secondary | ICD-10-CM | POA: Diagnosis not present

## 2022-06-02 DIAGNOSIS — E785 Hyperlipidemia, unspecified: Secondary | ICD-10-CM | POA: Diagnosis not present

## 2022-06-02 DIAGNOSIS — N138 Other obstructive and reflux uropathy: Secondary | ICD-10-CM | POA: Diagnosis not present

## 2022-06-02 DIAGNOSIS — S3219XD Other fracture of sacrum, subsequent encounter for fracture with routine healing: Secondary | ICD-10-CM | POA: Diagnosis not present

## 2022-06-02 DIAGNOSIS — N179 Acute kidney failure, unspecified: Secondary | ICD-10-CM | POA: Diagnosis not present

## 2022-06-02 DIAGNOSIS — N401 Enlarged prostate with lower urinary tract symptoms: Secondary | ICD-10-CM | POA: Diagnosis not present

## 2022-06-02 DIAGNOSIS — Z792 Long term (current) use of antibiotics: Secondary | ICD-10-CM | POA: Diagnosis not present

## 2022-06-02 DIAGNOSIS — Z556 Problems related to health literacy: Secondary | ICD-10-CM | POA: Diagnosis not present

## 2022-06-02 DIAGNOSIS — E11319 Type 2 diabetes mellitus with unspecified diabetic retinopathy without macular edema: Secondary | ICD-10-CM | POA: Diagnosis not present

## 2022-06-02 DIAGNOSIS — M5030 Other cervical disc degeneration, unspecified cervical region: Secondary | ICD-10-CM | POA: Diagnosis not present

## 2022-06-02 DIAGNOSIS — I700 Atherosclerosis of aorta: Secondary | ICD-10-CM | POA: Diagnosis not present

## 2022-06-02 DIAGNOSIS — Z9181 History of falling: Secondary | ICD-10-CM | POA: Diagnosis not present

## 2022-06-02 DIAGNOSIS — Z9981 Dependence on supplemental oxygen: Secondary | ICD-10-CM | POA: Diagnosis not present

## 2022-06-06 ENCOUNTER — Other Ambulatory Visit: Payer: Self-pay

## 2022-06-06 ENCOUNTER — Emergency Department (HOSPITAL_COMMUNITY): Payer: Medicare Other

## 2022-06-06 ENCOUNTER — Inpatient Hospital Stay (HOSPITAL_COMMUNITY)
Admission: EM | Admit: 2022-06-06 | Discharge: 2022-06-09 | DRG: 312 | Disposition: A | Discharge status: Home / Self Care | Payer: Medicare Other | Attending: Internal Medicine | Admitting: Internal Medicine

## 2022-06-06 DIAGNOSIS — I10 Essential (primary) hypertension: Secondary | ICD-10-CM | POA: Diagnosis present

## 2022-06-06 DIAGNOSIS — N390 Urinary tract infection, site not specified: Secondary | ICD-10-CM

## 2022-06-06 DIAGNOSIS — R8271 Bacteriuria: Secondary | ICD-10-CM | POA: Diagnosis present

## 2022-06-06 DIAGNOSIS — T07XXXA Unspecified multiple injuries, initial encounter: Secondary | ICD-10-CM

## 2022-06-06 DIAGNOSIS — N179 Acute kidney failure, unspecified: Secondary | ICD-10-CM | POA: Diagnosis not present

## 2022-06-06 DIAGNOSIS — R296 Repeated falls: Secondary | ICD-10-CM

## 2022-06-06 DIAGNOSIS — Z23 Encounter for immunization: Secondary | ICD-10-CM

## 2022-06-06 DIAGNOSIS — Z7982 Long term (current) use of aspirin: Secondary | ICD-10-CM

## 2022-06-06 DIAGNOSIS — Z978 Presence of other specified devices: Secondary | ICD-10-CM | POA: Diagnosis present

## 2022-06-06 DIAGNOSIS — R531 Weakness: Secondary | ICD-10-CM | POA: Diagnosis not present

## 2022-06-06 DIAGNOSIS — Z466 Encounter for fitting and adjustment of urinary device: Secondary | ICD-10-CM | POA: Diagnosis not present

## 2022-06-06 DIAGNOSIS — Z87891 Personal history of nicotine dependence: Secondary | ICD-10-CM

## 2022-06-06 DIAGNOSIS — E785 Hyperlipidemia, unspecified: Secondary | ICD-10-CM | POA: Diagnosis present

## 2022-06-06 DIAGNOSIS — Z7984 Long term (current) use of oral hypoglycemic drugs: Secondary | ICD-10-CM

## 2022-06-06 DIAGNOSIS — S80212A Abrasion, left knee, initial encounter: Secondary | ICD-10-CM | POA: Diagnosis present

## 2022-06-06 DIAGNOSIS — R748 Abnormal levels of other serum enzymes: Secondary | ICD-10-CM | POA: Diagnosis present

## 2022-06-06 DIAGNOSIS — Z9181 History of falling: Secondary | ICD-10-CM | POA: Diagnosis not present

## 2022-06-06 DIAGNOSIS — E860 Dehydration: Secondary | ICD-10-CM | POA: Diagnosis not present

## 2022-06-06 DIAGNOSIS — G909 Disorder of the autonomic nervous system, unspecified: Secondary | ICD-10-CM | POA: Diagnosis present

## 2022-06-06 DIAGNOSIS — I951 Orthostatic hypotension: Secondary | ICD-10-CM | POA: Diagnosis present

## 2022-06-06 DIAGNOSIS — Z66 Do not resuscitate: Secondary | ICD-10-CM | POA: Diagnosis present

## 2022-06-06 DIAGNOSIS — S2242XD Multiple fractures of ribs, left side, subsequent encounter for fracture with routine healing: Secondary | ICD-10-CM | POA: Diagnosis not present

## 2022-06-06 DIAGNOSIS — N401 Enlarged prostate with lower urinary tract symptoms: Secondary | ICD-10-CM | POA: Diagnosis not present

## 2022-06-06 DIAGNOSIS — Z79899 Other long term (current) drug therapy: Secondary | ICD-10-CM | POA: Diagnosis not present

## 2022-06-06 DIAGNOSIS — Z9359 Other cystostomy status: Secondary | ICD-10-CM | POA: Diagnosis present

## 2022-06-06 DIAGNOSIS — N138 Other obstructive and reflux uropathy: Secondary | ICD-10-CM | POA: Diagnosis not present

## 2022-06-06 DIAGNOSIS — R000 Tachycardia, unspecified: Secondary | ICD-10-CM | POA: Diagnosis not present

## 2022-06-06 DIAGNOSIS — R627 Adult failure to thrive: Secondary | ICD-10-CM

## 2022-06-06 DIAGNOSIS — Z960 Presence of urogenital implants: Secondary | ICD-10-CM | POA: Diagnosis present

## 2022-06-06 DIAGNOSIS — S80211A Abrasion, right knee, initial encounter: Secondary | ICD-10-CM | POA: Diagnosis not present

## 2022-06-06 DIAGNOSIS — R600 Localized edema: Secondary | ICD-10-CM | POA: Diagnosis not present

## 2022-06-06 DIAGNOSIS — F69 Unspecified disorder of adult personality and behavior: Secondary | ICD-10-CM | POA: Diagnosis not present

## 2022-06-06 DIAGNOSIS — E1142 Type 2 diabetes mellitus with diabetic polyneuropathy: Secondary | ICD-10-CM | POA: Diagnosis present

## 2022-06-06 DIAGNOSIS — E119 Type 2 diabetes mellitus without complications: Secondary | ICD-10-CM | POA: Diagnosis not present

## 2022-06-06 DIAGNOSIS — E440 Moderate protein-calorie malnutrition: Secondary | ICD-10-CM

## 2022-06-06 DIAGNOSIS — R410 Disorientation, unspecified: Secondary | ICD-10-CM | POA: Diagnosis not present

## 2022-06-06 DIAGNOSIS — I959 Hypotension, unspecified: Secondary | ICD-10-CM | POA: Diagnosis not present

## 2022-06-06 DIAGNOSIS — H548 Legal blindness, as defined in USA: Secondary | ICD-10-CM | POA: Diagnosis present

## 2022-06-06 LAB — COMPREHENSIVE METABOLIC PANEL
ALT: 13 U/L (ref 0–44)
AST: 29 U/L (ref 15–41)
Albumin: 3.5 g/dL (ref 3.5–5.0)
Alkaline Phosphatase: 86 U/L (ref 38–126)
Anion gap: 8 (ref 5–15)
BUN: 33 mg/dL — ABNORMAL HIGH (ref 8–23)
CO2: 23 mmol/L (ref 22–32)
Calcium: 8.2 mg/dL — ABNORMAL LOW (ref 8.9–10.3)
Chloride: 100 mmol/L (ref 98–111)
Creatinine, Ser: 1.4 mg/dL — ABNORMAL HIGH (ref 0.61–1.24)
GFR, Estimated: 55 mL/min — ABNORMAL LOW (ref >60)
Glucose, Bld: 133 mg/dL — ABNORMAL HIGH (ref 70–99)
Potassium: 5 mmol/L (ref 3.5–5.1)
Sodium: 131 mmol/L — ABNORMAL LOW (ref 135–145)
Total Bilirubin: 1 mg/dL (ref 0.3–1.2)
Total Protein: 6.2 g/dL — ABNORMAL LOW (ref 6.5–8.1)

## 2022-06-06 LAB — CBC WITH DIFFERENTIAL/PLATELET
Abs Immature Granulocytes: 0.05 10*3/uL (ref 0.00–0.07)
Basophils Absolute: 0.1 10*3/uL (ref 0.0–0.1)
Basophils Relative: 1 %
Eosinophils Absolute: 0 10*3/uL (ref 0.0–0.5)
Eosinophils Relative: 0 %
HCT: 37.1 % — ABNORMAL LOW (ref 39.0–52.0)
Hemoglobin: 12 g/dL — ABNORMAL LOW (ref 13.0–17.0)
Immature Granulocytes: 1 %
Lymphocytes Relative: 14 %
Lymphs Abs: 1.2 10*3/uL (ref 0.7–4.0)
MCH: 28.2 pg (ref 26.0–34.0)
MCHC: 32.3 g/dL (ref 30.0–36.0)
MCV: 87.3 fL (ref 80.0–100.0)
Monocytes Absolute: 1.1 10*3/uL — ABNORMAL HIGH (ref 0.1–1.0)
Monocytes Relative: 12 %
Neutro Abs: 6.6 10*3/uL (ref 1.7–7.7)
Neutrophils Relative %: 72 %
Platelets: 239 10*3/uL (ref 150–400)
RBC: 4.25 MIL/uL (ref 4.22–5.81)
RDW: 15.1 % (ref 11.5–15.5)
WBC: 9.1 10*3/uL (ref 4.0–10.5)
nRBC: 0 % (ref 0.0–0.2)

## 2022-06-06 LAB — URINALYSIS, ROUTINE W REFLEX MICROSCOPIC
Bilirubin Urine: NEGATIVE
Glucose, UA: >1000 mg/dL — AB
Ketones, ur: NEGATIVE mg/dL
Nitrite: NEGATIVE
Protein, ur: NEGATIVE mg/dL
RBC / HPF: >50 RBC/hpf (ref 0–5)
Specific Gravity, Urine: 1.02 (ref 1.005–1.030)
WBC, UA: >50 WBC/hpf (ref 0–5)
pH: 6 (ref 5.0–8.0)

## 2022-06-06 LAB — LACTIC ACID, PLASMA: Lactic Acid, Venous: 1.5 mmol/L (ref 0.5–1.9)

## 2022-06-06 LAB — CBG MONITORING, ED: Glucose-Capillary: 143 mg/dL — ABNORMAL HIGH (ref 70–99)

## 2022-06-06 LAB — TROPONIN I (HIGH SENSITIVITY)
Troponin I (High Sensitivity): 18 ng/L — ABNORMAL HIGH (ref <18)
Troponin I (High Sensitivity): 14 ng/L (ref <18)

## 2022-06-06 LAB — CK: Total CK: 932 U/L — ABNORMAL HIGH (ref 49–397)

## 2022-06-06 MED ORDER — AMITRIPTYLINE HCL 25 MG PO TABS
50.0000 mg | ORAL_TABLET | Freq: Every day | ORAL | Status: DC
  Administered 2022-06-06: 50 mg via ORAL
  Filled 2022-06-06: qty 2

## 2022-06-06 MED ORDER — OXYCODONE HCL 5 MG PO TABS
5.0000 mg | ORAL_TABLET | Freq: Four times a day (QID) | ORAL | Status: DC | PRN
  Administered 2022-06-06 – 2022-06-08 (×5): 5 mg via ORAL
  Filled 2022-06-06 (×4): qty 1

## 2022-06-06 MED ORDER — LACTATED RINGERS IV BOLUS: 500.0000 mL | Freq: Once | INTRAVENOUS | Status: DC

## 2022-06-06 MED ORDER — ALBUTEROL SULFATE (2.5 MG/3ML) 0.083% IN NEBU: 2.5000 mg | INHALATION_SOLUTION | RESPIRATORY_TRACT | Status: DC | PRN

## 2022-06-06 MED ORDER — SODIUM CHLORIDE 0.9 % IV SOLN: INTRAVENOUS | Status: DC

## 2022-06-06 MED ORDER — POLYETHYLENE GLYCOL 3350 17 G PO PACK: 17.0000 g | PACK | Freq: Every day | ORAL | Status: DC | PRN

## 2022-06-06 MED ORDER — ACETAMINOPHEN 650 MG RE SUPP: 650.0000 mg | Freq: Four times a day (QID) | RECTAL | Status: DC | PRN

## 2022-06-06 MED ORDER — ASPIRIN 81 MG PO TBEC
81.0000 mg | DELAYED_RELEASE_TABLET | Freq: Every day | ORAL | Status: DC
  Administered 2022-06-07 – 2022-06-09 (×3): 81 mg via ORAL
  Filled 2022-06-06 (×3): qty 1

## 2022-06-06 MED ORDER — SIMVASTATIN 20 MG PO TABS
20.0000 mg | ORAL_TABLET | Freq: Every day | ORAL | Status: DC
  Administered 2022-06-07 – 2022-06-08 (×2): 20 mg via ORAL
  Filled 2022-06-06 (×2): qty 1

## 2022-06-06 MED ORDER — TAMSULOSIN HCL 0.4 MG PO CAPS
0.4000 mg | ORAL_CAPSULE | Freq: Two times a day (BID) | ORAL | Status: DC
  Administered 2022-06-07: 0.4 mg via ORAL
  Filled 2022-06-06: qty 1

## 2022-06-06 MED ORDER — ACETAMINOPHEN 325 MG PO TABS
650.0000 mg | ORAL_TABLET | Freq: Four times a day (QID) | ORAL | Status: DC | PRN
  Filled 2022-06-06: qty 2

## 2022-06-06 MED ORDER — LACTATED RINGERS IV BOLUS
1000.0000 mL | Freq: Once | INTRAVENOUS | Status: AC
  Administered 2022-06-06: 1000 mL via INTRAVENOUS

## 2022-06-06 MED ORDER — SODIUM CHLORIDE 0.9 % IV SOLN
1.0000 g | Freq: Once | INTRAVENOUS | Status: AC
  Administered 2022-06-06: 1 g via INTRAVENOUS
  Filled 2022-06-06: qty 10

## 2022-06-06 MED ORDER — ENOXAPARIN SODIUM 40 MG/0.4ML IJ SOSY
40.0000 mg | PREFILLED_SYRINGE | INTRAMUSCULAR | Status: DC
  Administered 2022-06-06 – 2022-06-08 (×3): 40 mg via SUBCUTANEOUS
  Filled 2022-06-06 (×3): qty 0.4

## 2022-06-06 MED ORDER — HYDRALAZINE HCL 20 MG/ML IJ SOLN: 10.0000 mg | Freq: Three times a day (TID) | INTRAMUSCULAR | Status: DC | PRN

## 2022-06-06 MED ORDER — ONDANSETRON HCL 4 MG/2ML IJ SOLN: 4.0000 mg | Freq: Four times a day (QID) | INTRAMUSCULAR | Status: DC | PRN

## 2022-06-06 MED ORDER — TETANUS-DIPHTH-ACELL PERTUSSIS 5-2.5-18.5 LF-MCG/0.5 IM SUSY
0.5000 mL | PREFILLED_SYRINGE | Freq: Once | INTRAMUSCULAR | Status: AC
  Administered 2022-06-06: 0.5 mL via INTRAMUSCULAR
  Filled 2022-06-06: qty 0.5

## 2022-06-06 MED ORDER — ONDANSETRON HCL 4 MG PO TABS: 4.0000 mg | ORAL_TABLET | Freq: Four times a day (QID) | ORAL | Status: DC | PRN

## 2022-06-06 MED ORDER — INSULIN ASPART 100 UNIT/ML IJ SOLN
0.0000 [IU] | Freq: Three times a day (TID) | INTRAMUSCULAR | Status: DC
  Administered 2022-06-07 – 2022-06-08 (×3): 2 [IU] via SUBCUTANEOUS
  Filled 2022-06-06: qty 0.09

## 2022-06-06 NOTE — ED Provider Notes (Addendum)
[facility] Provider Note   CSN: [id number] Arrival date & time: 06/06/22  1136     History  Chief Complaint  Patient presents with   Dustin Duffy    [name] is a 67 y.o. male.  Patient c/o a couple of falls in past day. Initially fall last night and again this AM.  States at times feels like knees want to give way. Has walker, but did not have with him when fell. Was able to crawl to bed and get him self up. Abrasions to knees. Last tetanus unknown. Denies loc. No faintness or syncope. No headache. No neck or back pain. Denies chest pain or sob. No abd pain or nv. No anticoagulant use. No fever or chills. Has felt generally weak in past week w relatively poor po intake.  The history is provided by the patient, medical records and the EMS personnel.  Fall Pertinent negatives include no chest pain, no abdominal pain, no headaches and no shortness of breath.       Home Medications Prior to Admission medications   Medication Sig Start Date End Date Taking? Authorizing Provider  amitriptyline (ELAVIL) 50 MG tablet Take 50 mg by mouth at bedtime. 01/06/21   [provider]  aspirin EC 81 MG tablet Take 81 mg by mouth in the morning. Swallow whole.    [provider]  cholecalciferol (VITAMIN D3) 25 MCG (1000 UNIT) tablet Take 1,000 Units by mouth 3 (three) times a week.    [provider]  fluconazole (DIFLUCAN) 200 MG tablet Take 1 tablet (200 mg total) by mouth daily. 05/03/22   Samuella Cota, MD  gabapentin (NEURONTIN) 300 MG capsule Take 300 mg by mouth 3 (three) times daily. 02/05/21   [provider]  glipiZIDE (GLUCOTROL XL) 2.5 MG 24 hr tablet Take 2.5 mg by mouth daily with breakfast.    [provider]  ipratropium (ATROVENT) 0.03 % nasal spray Place 2 sprays into both nostrils 2 (two) times daily as needed for rhinitis.    [provider]  metoprolol tartrate (LOPRESSOR) 25 MG  tablet Take 1 tablet (25 mg total) by mouth 2 (two) times daily. 02/15/21 04/29/22  Donne Hazel, MD  RYBELSUS 14 MG TABS Take 14 mg by mouth daily before breakfast.    [provider]  simvastatin (ZOCOR) 20 MG tablet Take 1 tablet (20 mg total) by mouth every evening. Do not take this medication until you finish Diflucan. 05/02/22   Samuella Cota, MD  tamsulosin (FLOMAX) 0.4 MG CAPS capsule Take 0.4 mg by mouth 2 (two) times daily. 01/06/21   [provider]  XIGDUO XR 10-500 MG TB24 Take 1 tablet by mouth daily before breakfast.    [provider]      Allergies    Patient has no known allergies.    Review of Systems   Review of Systems  Constitutional:  Negative for chills and fever.  HENT:  Negative for sore throat.   Eyes:  Negative for pain and visual disturbance.  Respiratory:  Negative for cough and shortness of breath.   Cardiovascular:  Negative for chest pain.  Gastrointestinal:  Negative for abdominal pain, blood in stool, diarrhea and vomiting.  Genitourinary:  Negative for dysuria and flank pain.  Musculoskeletal:  Negative for back pain and neck pain.  Skin:  Negative for rash.  Neurological:  Negative for headaches.  Hematological:  Does not bruise/bleed easily.  Psychiatric/Behavioral:  Negative for confusion.     Physical Exam Updated Vital Signs BP 103/65   Pulse 81   Temp 98.8 F (37.1 C) (Rectal)   Resp 14   Wt 63.5 kg   SpO2 91%   BMI 21.29 kg/m  Physical Exam Vitals and nursing note reviewed.  Constitutional:      Appearance: Normal appearance. He is well-developed.  HENT:     Head:     Comments: Older appearing bruises to forehead.     Nose: Nose normal.     Mouth/Throat:     Mouth: Mucous membranes are moist.     Pharynx: Oropharynx is clear.  Eyes:     General: No scleral icterus.    Conjunctiva/sclera: Conjunctivae normal.     Pupils: Pupils are equal, round, and reactive to light.  Neck:     Vascular: No  carotid bruit.     Trachea: No tracheal deviation.  Cardiovascular:     Rate and Rhythm: Normal rate and regular rhythm.     Pulses: Normal pulses.     Heart sounds: Normal heart sounds. No murmur heard.    No friction rub. No gallop.  Pulmonary:     Effort: Pulmonary effort is normal. No accessory muscle usage or respiratory distress.     Breath sounds: Normal breath sounds.  Chest:     Chest wall: No tenderness.  Abdominal:     General: Bowel sounds are normal. There is no distension.     Palpations: Abdomen is soft. There is no mass.     Tenderness: There is no abdominal tenderness. There is no guarding.  Genitourinary:    Comments: No cva tenderness. Musculoskeletal:        General: No swelling.     Cervical back: Normal range of motion and neck supple. No rigidity or tenderness.     Comments: CTLS spine, non tender, aligned, no step off. Good passive rom bil extremities without pain or focal bony tenderness. Superficial abrasion to knees.   Skin:    General: Skin is warm and dry.     Findings: No rash.  Neurological:     Mental Status: He is alert.     Comments: Alert, speech clear. Motor/sens grossly intact bil.   Psychiatric:        Mood and Affect: Mood normal.     ED Results / Procedures / Treatments   Labs (all labs ordered are listed, but only abnormal results are displayed) Results for orders placed or performed during the hospital encounter of 06/06/22  Urinalysis, Routine w reflex microscopic -Urine, Catheterized; Indwelling urinary catheter  Result Value Ref Range   Color, Urine YELLOW YELLOW   APPearance CLOUDY (A) CLEAR   Specific Gravity, Urine 1.020 1.005 - 1.030   pH 6.0 5.0 - 8.0   Glucose, UA >1,000 (A) NEGATIVE mg/dL   Hgb urine dipstick MODERATE (A) NEGATIVE   Bilirubin Urine NEGATIVE NEGATIVE   Ketones, ur NEGATIVE NEGATIVE mg/dL   Protein, ur NEGATIVE NEGATIVE mg/dL   Nitrite NEGATIVE NEGATIVE   Leukocytes,Ua MODERATE (A) NEGATIVE   RBC /  HPF >50 0 - 5 RBC/hpf   WBC, UA >50 0 - 5 WBC/hpf   Bacteria, UA FEW (A) NONE SEEN   Squamous Epithelial / HPF 0-5 0 - 5 /HPF   Mucus PRESENT    Budding Yeast PRESENT    Hyaline Casts, UA PRESENT    Uric Acid Crys, UA PRESENT   CBC with Differential  Result Value Ref  Range   WBC 9.1 4.0 - 10.5 K/uL   RBC 4.25 4.22 - 5.81 MIL/uL   Hemoglobin 12.0 (L) 13.0 - 17.0 g/dL   HCT 37.1 (L) 39.0 - 52.0 %   MCV 87.3 80.0 - 100.0 fL   MCH 28.2 26.0 - 34.0 pg   MCHC 32.3 30.0 - 36.0 g/dL   RDW 15.1 11.5 - 15.5 %   Platelets 239 150 - 400 K/uL   nRBC 0.0 0.0 - 0.2 %   Neutrophils Relative % 72 %   Neutro Abs 6.6 1.7 - 7.7 K/uL   Lymphocytes Relative 14 %   Lymphs Abs 1.2 0.7 - 4.0 K/uL   Monocytes Relative 12 %   Monocytes Absolute 1.1 (H) 0.1 - 1.0 K/uL   Eosinophils Relative 0 %   Eosinophils Absolute 0.0 0.0 - 0.5 K/uL   Basophils Relative 1 %   Basophils Absolute 0.1 0.0 - 0.1 K/uL   Immature Granulocytes 1 %   Abs Immature Granulocytes 0.05 0.00 - 0.07 K/uL  Comprehensive metabolic panel  Result Value Ref Range   Sodium 131 (L) 135 - 145 mmol/L   Potassium 5.0 3.5 - 5.1 mmol/L   Chloride 100 98 - 111 mmol/L   CO2 23 22 - 32 mmol/L   Glucose, Bld 133 (H) 70 - 99 mg/dL   BUN 33 (H) 8 - 23 mg/dL   Creatinine, Ser 1.40 (H) 0.61 - 1.24 mg/dL   Calcium 8.2 (L) 8.9 - 10.3 mg/dL   Total Protein 6.2 (L) 6.5 - 8.1 g/dL   Albumin 3.5 3.5 - 5.0 g/dL   AST 29 15 - 41 U/L   ALT 13 0 - 44 U/L   Alkaline Phosphatase 86 38 - 126 U/L   Total Bilirubin 1.0 0.3 - 1.2 mg/dL   GFR, Estimated 55 (L) >60 mL/min   Anion gap 8 5 - 15  Lactic acid, plasma  Result Value Ref Range   Lactic Acid, Venous 1.5 0.5 - 1.9 mmol/L  CBG monitoring, ED  Result Value Ref Range   Glucose-Capillary 143 (H) 70 - 99 mg/dL  Troponin I (High Sensitivity)  Result Value Ref Range   Troponin I (High Sensitivity) 18 (H) <18 ng/L  Troponin I (High Sensitivity)  Result Value Ref Range   Troponin I (High Sensitivity)  14 <18 ng/L     EKG None  Radiology DG Chest Port 1 View  Result Date: 06/06/2022 CLINICAL DATA:  Multiple falls EXAM: PORTABLE CHEST 1 VIEW COMPARISON:  None Available. FINDINGS: Normal mediastinum and cardiac silhouette. Normal pulmonary vasculature. No evidence of effusion, infiltrate, or pneumothorax. No acute bony abnormality. IMPRESSION: No acute cardiopulmonary process. Electronically Signed   By: Suzy Bouchard M.D.   On: 06/06/2022 13:10    Procedures Procedures    Medications Ordered in ED Medications  lactated ringers bolus 1,000 mL (0 mLs Intravenous Stopped 06/06/22 1438)  lactated ringers bolus 1,000 mL (0 mLs Intravenous Stopped 06/06/22 1527)    ED Course/ Medical Decision Making/ A&P                              Medical Decision Making Problems Addressed: Abrasion, multiple sites: acute illness or injury Dehydration: acute illness or injury with systemic symptoms that poses a threat to life or bodily functions Frequent falls: acute illness or injury with systemic symptoms that poses a threat to life or bodily functions Generalized weakness: acute illness or injury with  systemic symptoms that poses a threat to life or bodily functions  Amount and/or Complexity of Data Reviewed Independent Historian: EMS    Details: hx External Data Reviewed: notes. Labs: ordered. Decision-making details documented in ED Course. Radiology: ordered and independent interpretation performed. Decision-making details documented in ED Course. ECG/medicine tests: ordered and independent interpretation performed. Decision-making details documented in ED Course.  Risk Prescription drug management. Decision regarding hospitalization.   Iv ns. Continuous pulse ox and cardiac monitoring. Labs ordered/sent. Imaging ordered.   Differential diagnosis includes dehydration, anemia, aki, uti, etc. Dispo decision including potential need for admission considered - will get labs and  imaging and reassess.   Reviewed nursing notes and prior charts for additional history. External reports reviewed. Additional history from: EMS.  LR boluses.   Cardiac monitor: sinus rhythm, rate 83.  Labs reviewed/interpreted by me - wbc normal. Lactate normal. Bun/cr mildly elevated. Ivf boluses.   Xrays reviewed/interpreted by me - no pna.   Po fluids/food. Ambulate in hall/assist.   Pt has eaten/drank, feels improved. Bp   Pt indicates it has been fair while since foley changed. Rn to remove old, place new. Leg bag. Tx for possible uti.   Given soft bp, mild aki, frequent falls, will admit for fluids/obs, pt would benefit from PT and maximizing home health.  Hospitalists consulted for admission.   Additional fluids ordered.              Final Clinical Impression(s) / ED Diagnoses Final diagnoses:  None    Rx / DC Orders ED Discharge Orders     None          Lajean Saver, MD 06/06/22 1614

## 2022-06-06 NOTE — H&P (Addendum)
History and Physical    Patient: Dustin Duffy [id number] DOB: Feb 02, 1956 DOA: 06/06/2022 DOS: the patient was seen and examined on 06/06/2022 PCP: Janie Morning, DO  Patient coming from: Home  Chief Complaint:  Chief Complaint  Patient presents with   Fall   HPI: Dustin Duffy is a 67 y.o. male with medical history significant of HTN, HLD, DM-2, peripheral neuropathy, chronic indwelling Foley catheter, BPH, legally blind-who presented to the hospital with frequent falls.  Per patient-he lives by himself-he has a very good friend (retired Marine scientist) and 2 sisters that help him out with numerous issues at home.  Patient claims that for the past several days-his lower legs just "gave out" and as a result he has fallen frequently.  He claims he fell approximately 5 times yesterday.  There is no history of syncope.  No history of nausea, vomiting, diarrhea.  He fell again this morning-he called his his good friend Bailey Mech (retired nurse)-who then called 911 and the patient was brought to the ED.  On initial presentation-patient had borderline hypotension-and required IV fluid boluses.  There was concern for a UTI.  Patient was given IV fluids, IV Rocephin-and TRH was asked to admit this patient for further evaluation and treatment.  Per patient he has no history of fever.  Denies any abdominal pain/pelvic pain.  No headache No chest pain/shortness of breath No abdominal pain No nausea/vomiting/diarrhea No hematuria No pelvic pain No hematochezia/melena    Review of Systems: As mentioned in the history of present illness. All other systems reviewed and are negative. Past Medical History:  Diagnosis Date   BPH without urinary obstruction    Diabetes mellitus type 2 in nonobese [facility]    Dyslipidemia    Essential hypertension    Renal impairment    Visual impairment    No past surgical history on file. Social History:  reports that he quit smoking about 12 years ago. His smoking use  included cigarettes. He has a 35.00 pack-year smoking history. He has never used smokeless tobacco. He reports that he does not currently use drugs. No history on file for alcohol use.  No Known Allergies  No family history on file.  Prior to Admission medications   Medication Sig Start Date End Date Taking? Authorizing Provider  amitriptyline (ELAVIL) 50 MG tablet Take 50 mg by mouth at bedtime. 01/06/21  Yes [provider]  aspirin EC 81 MG tablet Take 81 mg by mouth in the morning. Swallow whole.   Yes [provider]  cholecalciferol (VITAMIN D3) 25 MCG (1000 UNIT) tablet Take 1,000 Units by mouth 3 (three) times a week.   Yes [provider]  gabapentin (NEURONTIN) 300 MG capsule Take 300 mg by mouth 3 (three) times daily as needed (for pain). 02/05/21  Yes [provider]  glipiZIDE (GLUCOTROL XL) 2.5 MG 24 hr tablet Take 2.5 mg by mouth daily with breakfast.   Yes [provider]  ipratropium (ATROVENT) 0.03 % nasal spray Place 2 sprays into both nostrils 2 (two) times daily as needed for rhinitis.   Yes [provider]  metoprolol tartrate (LOPRESSOR) 25 MG tablet Take 1 tablet (25 mg total) by mouth 2 (two) times daily. 02/15/21 06/06/22 Yes Donne Hazel, MD  Semaglutide (OZEMPIC, 0.25 OR 0.5 MG/DOSE, [place]) Inject 0.5 mg into the skin every Wednesday.   Yes [provider]  simvastatin (ZOCOR) 20 MG tablet Take 1 tablet (20 mg total) by mouth every evening. Do not take this  medication until you finish Diflucan. Patient taking differently: Take 20 mg by mouth at bedtime. 05/02/22  Yes Samuella Cota, MD  tamsulosin (FLOMAX) 0.4 MG CAPS capsule Take 0.4 mg by mouth 2 (two) times daily. 01/06/21  Yes [provider]  XIGDUO XR 10-500 MG TB24 Take 1 tablet by mouth daily before breakfast.   Yes [provider]  fluconazole (DIFLUCAN) 200 MG tablet Take 1 tablet (200 mg total) by mouth daily. Patient not  taking: Reported on 06/06/2022 05/03/22   Samuella Cota, MD    Physical Exam: Vitals:   06/06/22 1625 06/06/22 1630 06/06/22 1645 06/06/22 1700  BP: (!) 140/73 (!) 139/90 110/81 118/73  Pulse: 93 93 86 85  Resp: (!) '27 19 15 '$ (!) 22  Temp: (!) 97.5 F (36.4 C)     TempSrc: Rectal     SpO2: 95% 95% 97% 91%  Weight:       Gen Exam:Alert awake-not in any distress HEENT:atraumatic, normocephalic Chest: B/L clear to auscultation anteriorly CVS:S1S2 regular Abdomen:soft non tender, non distended Extremities:no edema-but has bruising/erythema in the anterior aspect of both knees.  See picture below. Neurology: Non focal Skin: no rash    Data Reviewed:    Latest Ref Rng & Units 06/06/2022   12:18 PM 05/02/2022    4:07 AM 05/01/2022    9:13 AM  CBC  WBC 4.0 - 10.5 K/uL 9.1  6.8  7.3   Hemoglobin 13.0 - 17.0 g/dL 12.0  11.3  12.6   Hematocrit 39.0 - 52.0 % 37.1  34.5  40.1   Platelets 150 - 400 K/uL 239  103  105         Latest Ref Rng & Units 06/06/2022   12:18 PM 05/02/2022    4:07 AM 05/01/2022    9:13 AM  BMP  Glucose 70 - 99 mg/dL 133  170  130   BUN 8 - 23 mg/dL 33  35  43   Creatinine 0.61 - 1.24 mg/dL 1.40  1.07  1.36   Sodium 135 - 145 mmol/L 131  132  134   Potassium 3.5 - 5.1 mmol/L 5.0  3.7  4.3   Chloride 98 - 111 mmol/L 100  102  103   CO2 22 - 32 mmol/L '23  22  18   '$ Calcium 8.9 - 10.3 mg/dL 8.2  7.5  7.6      Assessment and Plan: Frequent falls Similar admission in early February 2024 Suspect possible orthostatic mechanism playing a role Gently hydrate overnight Check orthostatic vital signs tomorrow morning Hold all antihypertensives Cautiously continue with amitriptyline/Flomax for now If he is indeed orthostatic tomorrow morning-May need to stop amitriptyline and Flomax.  AKI Mild Likely hemodynamically mediated Check CK given frequent falls Gently hydrate with IVF Repeat electrolytes in a.m.-if not improved then may require further  workup  Asymptomatic bacteriuria Has no signs of UTI-afebrile-no leukocytosis Has chronic indwelling Foley catheter-hence UA unreliable Hold further doses of Rocephin and monitor off antibiotics  HTN BP soft on initial presentation-responding to IV fluid boluses Holding metoprolol for now Resume when able  DM-2 Holding glipizide/Ozempic/metformin and SSI while inpatient Follow CBGs and adjust insulin regimen accordingly  BPH Chronic indwelling Foley catheter Foley catheter being exchanged in the ED on admission Continue Flomax for now but if orthostatic tomorrow morning-after IV fluid hydration-May need to stop Flomax or change dosage to daily dosing  HLD Statin  Legally blind History of frequent falls Recent hospitalization February 2024 for  frequent fall/T12 wedge compression fracture/S3/S4 fracture Lives alone Not keen on going to ALF as he has a small dog  He is aware that he is either gone any more help at home or he may have to go to ALF/ILF/SNF PT/OT eval Checking orthostatics Transition of care team eval for increasing home health options-and possible discussion for ILF/ALF/SNF down the line    Advance Care Planning:   Code Status: DNR   Consults: None  Family Communication: None at bedside  Severity of Illness: The appropriate patient status for this patient is OBSERVATION. Observation status is judged to be reasonable and necessary in order to provide the required intensity of service to ensure the patient's safety. The patient's presenting symptoms, physical exam findings, and initial radiographic and laboratory data in the context of their medical condition is felt to place them at decreased risk for further clinical deterioration. Furthermore, it is anticipated that the patient will be medically stable for discharge from the hospital within 2 midnights of admission.   Author: Oren Binet, MD 06/06/2022 5:28 PM  For on call review [email or web address].

## 2022-06-06 NOTE — ED Notes (Signed)
Given food and drink ?

## 2022-06-06 NOTE — ED Provider Notes (Signed)
   ED Course / MDM   Clinical Course as of 06/06/22 1734  Mon Jun 06, 2022  1703 Received sign out from Dr. Ashok Cordia pending admission to medicine. Has foley with concern for infection, recent generalized weakness with frequent falls in setting of trauma.  [WS]  1722 Discussed with hospitalist who will admit [WS]    Clinical Course User Index [WS] Cristie Hem, MD   Medical Decision Making Amount and/or Complexity of Data Reviewed Labs: ordered. Radiology: ordered. ECG/medicine tests: ordered.  Risk Prescription drug management. Decision regarding hospitalization.         Cristie Hem, MD 06/06/22 1734

## 2022-06-06 NOTE — ED Triage Notes (Signed)
BIB [facility] from home, lives alone, here for multiple recent falls, multiple bruises and areas of redness at various stages, hit head, does not take blood thinners, uses rollator at home, legally blind, house in disarray per EMS, friends/family report confusion, has insdwelling catheter that "has burst", unknown timeframe, recent antibiotics, admitted last month for rib fxs r/t previous fall, endorses falling d/t leg weakness. Takes betablocker, HR 82. BP 92/40 improved to 118/52 after 500cc IVF. SPO2 90% improved to 98% on 2L. CBG 191. Alert, NAD, calm, interactive, answering questions, speaking clearly, resps e/u.

## 2022-06-06 NOTE — ED Notes (Signed)
Admitting MD at BS.  

## 2022-06-06 NOTE — ED Notes (Signed)
PT has a leg bag.. waiting for a output

## 2022-06-06 NOTE — ED Notes (Signed)
EDP at BS 

## 2022-06-07 ENCOUNTER — Observation Stay (HOSPITAL_COMMUNITY): Payer: Medicare Other

## 2022-06-07 DIAGNOSIS — Z79899 Other long term (current) drug therapy: Secondary | ICD-10-CM | POA: Diagnosis not present

## 2022-06-07 DIAGNOSIS — E785 Hyperlipidemia, unspecified: Secondary | ICD-10-CM | POA: Diagnosis present

## 2022-06-07 DIAGNOSIS — S80211A Abrasion, right knee, initial encounter: Secondary | ICD-10-CM | POA: Diagnosis present

## 2022-06-07 DIAGNOSIS — I10 Essential (primary) hypertension: Secondary | ICD-10-CM | POA: Diagnosis present

## 2022-06-07 DIAGNOSIS — S80212A Abrasion, left knee, initial encounter: Secondary | ICD-10-CM | POA: Diagnosis present

## 2022-06-07 DIAGNOSIS — Z7984 Long term (current) use of oral hypoglycemic drugs: Secondary | ICD-10-CM | POA: Diagnosis not present

## 2022-06-07 DIAGNOSIS — Z87891 Personal history of nicotine dependence: Secondary | ICD-10-CM | POA: Diagnosis not present

## 2022-06-07 DIAGNOSIS — Z66 Do not resuscitate: Secondary | ICD-10-CM | POA: Diagnosis present

## 2022-06-07 DIAGNOSIS — R600 Localized edema: Secondary | ICD-10-CM | POA: Diagnosis not present

## 2022-06-07 DIAGNOSIS — R531 Weakness: Secondary | ICD-10-CM | POA: Diagnosis present

## 2022-06-07 DIAGNOSIS — R8271 Bacteriuria: Secondary | ICD-10-CM | POA: Diagnosis present

## 2022-06-07 DIAGNOSIS — N401 Enlarged prostate with lower urinary tract symptoms: Secondary | ICD-10-CM | POA: Diagnosis present

## 2022-06-07 DIAGNOSIS — E860 Dehydration: Secondary | ICD-10-CM | POA: Diagnosis present

## 2022-06-07 DIAGNOSIS — Z9181 History of falling: Secondary | ICD-10-CM | POA: Diagnosis not present

## 2022-06-07 DIAGNOSIS — N138 Other obstructive and reflux uropathy: Secondary | ICD-10-CM | POA: Diagnosis present

## 2022-06-07 DIAGNOSIS — E1142 Type 2 diabetes mellitus with diabetic polyneuropathy: Secondary | ICD-10-CM | POA: Diagnosis present

## 2022-06-07 DIAGNOSIS — R296 Repeated falls: Secondary | ICD-10-CM | POA: Diagnosis present

## 2022-06-07 DIAGNOSIS — Z960 Presence of urogenital implants: Secondary | ICD-10-CM | POA: Diagnosis present

## 2022-06-07 DIAGNOSIS — N179 Acute kidney failure, unspecified: Secondary | ICD-10-CM | POA: Diagnosis present

## 2022-06-07 DIAGNOSIS — Z23 Encounter for immunization: Secondary | ICD-10-CM | POA: Diagnosis present

## 2022-06-07 DIAGNOSIS — I951 Orthostatic hypotension: Secondary | ICD-10-CM | POA: Diagnosis present

## 2022-06-07 DIAGNOSIS — H548 Legal blindness, as defined in USA: Secondary | ICD-10-CM | POA: Diagnosis present

## 2022-06-07 DIAGNOSIS — Z7982 Long term (current) use of aspirin: Secondary | ICD-10-CM | POA: Diagnosis not present

## 2022-06-07 DIAGNOSIS — G909 Disorder of the autonomic nervous system, unspecified: Secondary | ICD-10-CM | POA: Diagnosis present

## 2022-06-07 DIAGNOSIS — R748 Abnormal levels of other serum enzymes: Secondary | ICD-10-CM | POA: Diagnosis present

## 2022-06-07 LAB — BASIC METABOLIC PANEL
Anion gap: 9 (ref 5–15)
BUN: 22 mg/dL (ref 8–23)
CO2: 24 mmol/L (ref 22–32)
Calcium: 8.2 mg/dL — ABNORMAL LOW (ref 8.9–10.3)
Chloride: 101 mmol/L (ref 98–111)
Creatinine, Ser: 1.12 mg/dL (ref 0.61–1.24)
GFR, Estimated: >60 mL/min (ref >60)
Glucose, Bld: 92 mg/dL (ref 70–99)
Potassium: 4.1 mmol/L (ref 3.5–5.1)
Sodium: 134 mmol/L — ABNORMAL LOW (ref 135–145)

## 2022-06-07 LAB — CBC
HCT: 33.3 % — ABNORMAL LOW (ref 39.0–52.0)
Hemoglobin: 10.7 g/dL — ABNORMAL LOW (ref 13.0–17.0)
MCH: 28.3 pg (ref 26.0–34.0)
MCHC: 32.1 g/dL (ref 30.0–36.0)
MCV: 88.1 fL (ref 80.0–100.0)
Platelets: 218 10*3/uL (ref 150–400)
RBC: 3.78 MIL/uL — ABNORMAL LOW (ref 4.22–5.81)
RDW: 15.4 % (ref 11.5–15.5)
WBC: 6.1 10*3/uL (ref 4.0–10.5)
nRBC: 0 % (ref 0.0–0.2)

## 2022-06-07 LAB — URINE CULTURE: Culture: NO GROWTH

## 2022-06-07 LAB — GLUCOSE, CAPILLARY
Glucose-Capillary: 110 mg/dL — ABNORMAL HIGH (ref 70–99)
Glucose-Capillary: 185 mg/dL — ABNORMAL HIGH (ref 70–99)
Glucose-Capillary: 160 mg/dL — ABNORMAL HIGH (ref 70–99)
Glucose-Capillary: 197 mg/dL — ABNORMAL HIGH (ref 70–99)

## 2022-06-07 MED ORDER — MIDODRINE HCL 5 MG PO TABS
5.0000 mg | ORAL_TABLET | Freq: Three times a day (TID) | ORAL | Status: DC
  Administered 2022-06-07 – 2022-06-09 (×7): 5 mg via ORAL
  Filled 2022-06-07 (×7): qty 1

## 2022-06-07 MED ORDER — AMITRIPTYLINE HCL 10 MG PO TABS
10.0000 mg | ORAL_TABLET | Freq: Every day | ORAL | Status: DC
  Administered 2022-06-07 – 2022-06-08 (×2): 10 mg via ORAL
  Filled 2022-06-07 (×2): qty 1

## 2022-06-07 MED ORDER — SODIUM CHLORIDE 0.9 % IV SOLN: INTRAVENOUS | Status: DC

## 2022-06-07 MED ORDER — CHLORHEXIDINE GLUCONATE CLOTH 2 % EX PADS
6.0000 | MEDICATED_PAD | Freq: Every day | CUTANEOUS | Status: DC
  Administered 2022-06-07 – 2022-06-08 (×2): 6 via TOPICAL

## 2022-06-07 MED ORDER — MELATONIN 5 MG PO TABS
5.0000 mg | ORAL_TABLET | Freq: Once | ORAL | Status: AC
  Administered 2022-06-07: 5 mg via ORAL
  Filled 2022-06-07: qty 1

## 2022-06-07 NOTE — Progress Notes (Signed)
PROGRESS NOTE  Dustin Duffy  [id number] DOB: October 19, 1955 DOA: 06/06/2022 PCP: Janie Morning, DO   Brief Narrative:  Patient is a 67 year old male with history of hypertension, hyperlipidemia, diabetes type 2, peripheral neuropathy, chronic indwelling Foley catheter, BPH, legally blind who presented from home with frequent falls.  He lives by himself but has a friend and 2 sisters who help him out.  Report of falling 5 times at home.  On presentation: blood pressure was soft, given IV fluids.   PT/OT consulted.  Noted to be orthostatic this morning.  Assessment & Plan:  Principal Problem:   Falls frequently Active Problems:   Diabetes mellitus type 2 in nonobese [facility])   Dyslipidemia   Essential hypertension   BPH with urinary obstruction   Foley catheter in place prior to arrival   Frequent falls/orthostatic hypotension: Was admitted last time in February for same problem.  Frequent falls at home.  Lives alone.  Blood pressure was soft on presentation.  He is orthostatic and this is the most likely contributing  factor.  Antihypertensives on hold,started low-dose midodrine, will also put him on compression stockings  Elevated CK: CK was elevated at 932.  Continue IV fluids.  AKI: Presented with creatinine of 1.4.  Resolved with IV fluids.  Asymptomatic bacteriuria: UA was suspicious for UTI.  Afebrile, no leukocytosis, no dysuria.  Has chronic indwelling Foley catheter.  Given a dose of ceftriaxone, currently on hold.  Monitor off antibiotics  Hypertension: Blood pressure was soft on presentation.  Takes metoprolol at home which is on hold.  Diabetes type 2: Takes glipizide, Ozempic, xigduo at home.  Currently on sliding scale insulin.  Monitor blood sugars.  History of BPH/chronic indwelling Foley catheter: Exchanged in the emergency department.  On Flomax.  Flomax will be  discontinued for now because he is orthostatic  Hyperlipidemia: Continue statin  Bilateral knee pain:   Falls on his knees usually, has erythema bilaterally  ,complains of pain but no edema or tenderness noted.  Will check x-ray of bilateral knees  Debility/deconditioning: Patient is legally blind.Lives alone.  History of frequent falls. [facility] hospitalized in February for the same and was found to have T 12  compression fracture,S3/S4 fracture.  Not interested in facility placement in the past and not interested now too.  PT/OT evaluation pending.  TOC consulted.  Preferably needs to be  placed in a facility in ILF/ALF or SNF but declines saying he needs to take  care his dog at home        DVT prophylaxis:enoxaparin (LOVENOX) injection 40 mg Start: 06/06/22 2200     Code Status: DNR  Family Communication: Called and discussed with sister Caren Griffins on phone on 3/12  Patient status:Obs  Patient is from :Home  Anticipated discharge [id number] vs SNF vs ALF  Estimated DC date:not sure   Consultants: None  Procedures:None  Antimicrobials:  Anti-infectives (From admission, onward)    Start     Dose/Rate Route Frequency Ordered Stop   06/06/22 1600  cefTRIAXone (ROCEPHIN) 1 g in sodium chloride 0.9 % 100 mL IVPB        1 g 200 mL/hr over 30 Minutes Intravenous  Once 06/06/22 1556 06/06/22 1706       Subjective: Patient seen and examined at bedside today.  He was being checked for orthostatic hypotension and it came out to be positive.  He is alert and oriented, very comfortable.  Denies any dizziness or lightheadedness, shortness of breath.  Alert and oriented.  Very  eager to go home and was asking when he can go.  Objective: Vitals:   06/06/22 2215 06/07/22 0622 06/07/22 0624 06/07/22 0628  BP: 103/82 105/71 113/79 107/72  Pulse: 97 96 (!) 101 (!) 103  Resp: '17 18 18 18  '$ Temp: 98.4 F (36.9 C) (!) 97.2 F (36.2 C)    TempSrc: Oral Oral    SpO2: 93% 96% 96% 97%  Weight:        Intake/Output Summary (Last 24 hours) at 06/07/2022 0747 Last data filed at 06/07/2022 0606 Gross  per 24 hour  Intake 3405 ml  Output 3050 ml  Net 355 ml   Filed Weights   06/06/22 1206  Weight: 63.5 kg    Examination:  General exam: Overall comfortable, not in distress, pleasant HEENT: Legally blind Respiratory system:  no wheezes or crackles  Cardiovascular system: S1 & S2 heard, RRR.  Gastrointestinal system: Abdomen is nondistended, soft and nontender. Central nervous system: Alert and oriented Extremities: No edema, no clubbing ,no cyanosis Skin: no ulcers,no icterus , erythematous changes on the bilateral knees GU: Foley   Data Reviewed: I have personally reviewed following labs and imaging studies  CBC: Recent Labs  Lab 06/06/22 1218 06/07/22 0517  WBC 9.1 6.1  NEUTROABS 6.6  --   HGB 12.0* 10.7*  HCT 37.1* 33.3*  MCV 87.3 88.1  PLT 239 [id number]   Basic Metabolic Panel: Recent Labs  Lab 06/06/22 1218 06/07/22 0517  NA 131* 134*  K 5.0 4.1  CL 100 101  CO2 23 24  GLUCOSE 133* 92  BUN 33* 22  CREATININE 1.40* 1.12  CALCIUM 8.2* 8.2*     No results found for this or any previous visit (from the past 240 hour(s)).   Radiology Studies: DG Chest Port 1 View  Result Date: 06/06/2022 CLINICAL DATA:  Multiple falls EXAM: PORTABLE CHEST 1 VIEW COMPARISON:  None Available. FINDINGS: Normal mediastinum and cardiac silhouette. Normal pulmonary vasculature. No evidence of effusion, infiltrate, or pneumothorax. No acute bony abnormality. IMPRESSION: No acute cardiopulmonary process. Electronically Signed   By: Suzy Bouchard M.D.   On: 06/06/2022 13:10    Scheduled Meds:  amitriptyline  50 mg Oral QHS   aspirin EC  81 mg Oral Daily   enoxaparin (LOVENOX) injection  40 mg Subcutaneous Q24H   insulin aspart  0-9 Units Subcutaneous TID WC   simvastatin  20 mg Oral QHS   tamsulosin  0.4 mg Oral BID   Continuous Infusions:  sodium chloride 50 mL/hr at 06/06/22 1742     LOS: 0 days   Shelly Coss, MD [facility] P3/02/2023, 7:47 AM

## 2022-06-07 NOTE — Progress Notes (Signed)
Mobility Specialist - Progress Note   06/07/22 1431  Mobility  Activity Ambulated with assistance in hallway  Level of Assistance Standby assist, set-up cues, supervision of patient - no hands on  Assistive Device Front wheel walker  Distance Ambulated (ft) 240 ft  Activity Response Tolerated well  Mobility Referral Yes  $Mobility charge 1 Mobility   Pt received in bed and agreeable to mobility. C/o knees hurting from previous fall. Pt to bed after session with all needs met.    [facility]

## 2022-06-07 NOTE — Evaluation (Signed)
Physical Therapy Evaluation Patient Details Name: Dustin Duffy MRN: [id number] DOB: 1956-03-02 Today's Date: 06/07/2022  History of Present Illness  Dustin Duffy is a 67 y.o. male presented to hospital with frequent falls. PHMx: HTN, HLD, DM-2, peripheral neuropathy, chronic indwelling Foley catheter, BPH, legally blind  Clinical Impression  Pt admitted with above diagnosis. Pt ambulated 200' with RW, no loss of balance. He denied dizziness thoughout PT session. See flowsheets for orthostatic vitals, pt had 15 point systolic BP sit to stand but was asymptomatic.  Pt currently with functional limitations due to the deficits listed below (see PT Problem List). Pt will benefit from skilled PT to increase their independence and safety with mobility to allow discharge to the venue listed below.          Recommendations for follow up therapy are one component of a multi-disciplinary discharge planning process, led by the attending physician.  Recommendations may be updated based on patient status, additional functional criteria and insurance authorization.  Follow Up Recommendations Home health PT      Assistance Recommended at Discharge Set up Supervision/Assistance  Patient can return home with the following  A little help with bathing/dressing/bathroom;Assistance with cooking/housework;Assist for transportation;Help with stairs or ramp for entrance    Equipment Recommendations None recommended by PT  Recommendations for Other Services       Functional Status Assessment Patient has had a recent decline in their functional status and demonstrates the ability to make significant improvements in function in a reasonable and predictable amount of time.     Precautions / Restrictions Precautions Precautions: Fall Precaution Comments: multiple falls in past 6 months Restrictions Weight Bearing Restrictions: No      Mobility  Bed Mobility Overal bed mobility: Modified Independent              General bed mobility comments: HOB up    Transfers Overall transfer level: Needs assistance Equipment used: Rolling walker (2 wheels) Transfers: Sit to/from Stand Sit to Stand: Min guard                Ambulation/Gait Ambulation/Gait assistance: Min guard Gait Distance (Feet): 200 Feet Assistive device: Rolling walker (2 wheels) Gait Pattern/deviations: Step-through pattern, Decreased stride length, Trunk flexed Gait velocity: decr     General Gait Details: VCs for proximity to RW, no loss of balance, denied dizziness  Stairs            Wheelchair Mobility    Modified Rankin (Stroke Patients Only)       Balance Overall balance assessment: Mild deficits observed, not formally tested                                           Pertinent Vitals/Pain Pain Assessment Pain Assessment: 0-10 Pain Score: 10-Worst pain ever Pain Location: B knees Pain Descriptors / Indicators: Sore Pain Intervention(s): Limited activity within patient's tolerance, Monitored during session    Home Living Family/patient expects to be discharged to:: Private residence Living Arrangements: Alone Available Help at Discharge: Family;Friend(s);Available PRN/intermittently Type of Home: House Home Access: Stairs to enter Entrance Stairs-Rails: Right;Left;Can reach both Entrance Stairs-Number of Steps: 3+1   Home Layout: One level Home Equipment: Rollator (4 wheels)      Prior Function Prior Level of Function : Independent/Modified Independent             Mobility Comments: pt reports use of  rollator or SPC ADLs Comments: takes care of small dog at home     Hand Dominance   Dominant Hand: Right    Extremity/Trunk Assessment   Upper Extremity Assessment Upper Extremity Assessment: Defer to OT evaluation    Lower Extremity Assessment Lower Extremity Assessment: Overall WFL for tasks assessed    Cervical / Trunk Assessment Cervical / Trunk  Assessment: Kyphotic  Communication   Communication: No difficulties  Cognition Arousal/Alertness: Awake/alert Behavior During Therapy: WFL for tasks assessed/performed Overall Cognitive Status: Within Functional Limits for tasks assessed                                          General Comments General comments (skin integrity, edema, etc.): see flowsheets for orthostatics, pt denied dizziness in all positions, BP did drop significantly with standing    Exercises     Assessment/Plan    PT Assessment Patient needs continued PT services  PT Problem List Decreased mobility;Decreased activity tolerance       PT Treatment Interventions Gait training;Therapeutic exercise;Therapeutic activities;Balance training;Patient/family education    PT Goals (Current goals can be found in the Care Plan section)  Acute Rehab PT Goals Patient Stated Goal: get home to his dog Lilly PT Goal Formulation: With patient Time For Goal Achievement: 06/21/22 Potential to Achieve Goals: Good    Frequency Min 3X/week     Co-evaluation PT/OT/SLP Co-Evaluation/Treatment: Yes Reason for Co-Treatment: For patient/therapist safety;To address functional/ADL transfers PT goals addressed during session: Mobility/safety with mobility;Balance;Proper use of DME;Strengthening/ROM OT goals addressed during session: Strengthening/ROM;ADL's and self-care       AM-PAC PT "6 Clicks" Mobility  Outcome Measure Help needed turning from your back to your side while in a flat bed without using bedrails?: None Help needed moving from lying on your back to sitting on the side of a flat bed without using bedrails?: A Little Help needed moving to and from a bed to a chair (including a wheelchair)?: A Little Help needed standing up from a chair using your arms (e.g., wheelchair or bedside chair)?: None Help needed to walk in hospital room?: A Little Help needed climbing 3-5 steps with a railing? : A  Little 6 Click Score: 20    End of Session Equipment Utilized During Treatment: Gait belt Activity Tolerance: Patient tolerated treatment well Patient left: in chair;with chair alarm set;with call bell/phone within reach Nurse Communication: Mobility status PT Visit Diagnosis: Difficulty in walking, not elsewhere classified (R26.2);Repeated falls (R29.6)    Time: [id number] PT Time Calculation (min) (ACUTE ONLY): 22 min   Charges:   PT Evaluation $PT Eval Moderate Complexity: 1 Mod          Philomena Doheny PT 06/07/2022  Acute Rehabilitation Services  Office [phone]

## 2022-06-07 NOTE — TOC Initial Note (Signed)
Transition of Care [facility]) - Initial/Assessment Note    Patient Details  Name: Dustin Duffy MRN: [id number] Date of Birth: [birth year]  Transition of [facility]) CM/SW Contact:    Vassie Moselle, LCSW Phone Number: 06/07/2022, 1:35 PM  Clinical Narrative:                 Met with pt who shares he lives at home alone. He uses a rollator at baseline. He was discharged with 2L O2 from [employer] on last admission however, states he has not had to use this has his sats have been 98 on RA. Pt is currently active with [facility] for home health PT,OT,SN. [facility] will also provide [place] SW, and Aide at discharge. HH orders for PT,OT,RN,Aide,and SW will need to be placed prior to discharge.   Expected Discharge Plan: [place] Barriers to Discharge: No Barriers Identified   Patient Goals and CMS Choice Patient states their goals for this hospitalization and ongoing recovery are:: To go home CMS Medicare.gov Compare Post Acute Care list provided to:: Patient Choice offered to / list presented to : Patient [place] ownership interest in [facility].provided to:: Patient    Expected Discharge Plan and Services In-house Referral: Clinical Social Work Discharge Planning Services: NA Post Acute Care Choice: Home Health Living arrangements for the past 2 months: Single Family Home                 DME Arranged: N/A DME Agency: NA       HH Arranged: RN, PT, OT, Nurse's Aide, Social Work [place] Agency: [facility] Date [place]: 06/07/22 Time [place]: 46 Representative spoke with at [employer]: [employer] Arrangements/Services Living arrangements for the past 2 months: [place] with:: Self Patient language and need for interpreter reviewed:: Yes Do you feel safe going back to the place where you live?: Yes      Need for Family Participation in Patient Care: No (Comment) Care giver support system in place?: No  (comment) Current home services: DME, Home OT, Home PT, Home RN (Cane, rollator, 2L home O2- through [employer]) Criminal Activity/Legal Involvement Pertinent to Current Situation/Hospitalization: No - Comment as needed  Activities of Daily Living Home Assistive Devices/Equipment: Environmental consultant (specify type) ADL Screening (condition at time of admission) Patient's cognitive ability adequate to safely complete daily activities?: Yes Is the patient deaf or have difficulty hearing?: No Does the patient have difficulty seeing, even when wearing glasses/contacts?: No Does the patient have difficulty concentrating, remembering, or making decisions?: No Patient able to express need for assistance with ADLs?: Yes Does the patient have difficulty dressing or bathing?: Yes Independently performs ADLs?: No Communication: Independent Dressing (OT): Needs assistance Is this a change from baseline?: Change from baseline, expected to last <3days Grooming: Needs assistance Is this a change from baseline?: Change from baseline, expected to last <3 days Feeding: Independent Bathing: Needs assistance Is this a change from baseline?: Change from baseline, expected to last <3 days Toileting: Needs assistance Is this a change from baseline?: Change from baseline, expected to last <3 days In/Out Bed: Needs assistance Is this a change from baseline?: Change from baseline, expected to last <3 days Walks in Home: Needs assistance Is this a change from baseline?: Change from baseline, expected to last <3 days Does the patient have difficulty walking or climbing stairs?: Yes Weakness of Legs: Both Weakness of Arms/Hands: None  Permission Sought/Granted Permission sought to  share information with : Facility Art therapist granted to share information with : Yes, Verbal Permission Granted     Permission granted to share info w AGENCY: [facility]        Emotional Assessment Appearance:: Appears stated  age Attitude/Demeanor/Rapport: Engaged Affect (typically observed): Accepting Orientation: : Oriented to Self, Oriented to Place, Oriented to  Time, Oriented to Situation Alcohol / Substance Use: Not Applicable Psych Involvement: No (comment)  Admission diagnosis:  Dehydration [E86.0] Falls frequently [R29.6] Acute UTI [N39.0] Failure to thrive in adult [R62.7] Generalized weakness [R53.1] Moderate protein-calorie malnutrition (HCC) [E44.0] Frequent falls [R29.6] Abrasion, multiple sites [T07.XXXA] Patient Active Problem List   Diagnosis Date Noted   Falls frequently 06/06/2022   Acute kidney injury ([place]) 04/29/2022   BPH with urinary obstruction 04/29/2022   Foley catheter in place prior to arrival 04/29/2022   Frequent falls 04/29/2022   Multiple fractures of ribs, left side, initial encounter for closed fracture 04/29/2022   Sacral fracture, closed ([place]) 04/29/2022   Closed wedge compression fracture of T12 vertebra ([place]) 04/29/2022   UTI (urinary tract infection) 04/29/2022   Diabetes mellitus type 2 in nonobese ([place]) 02/09/2021   Dyslipidemia 02/09/2021   Essential hypertension 02/09/2021   Renal impairment 02/09/2021   Non-traumatic rhabdomyolysis 02/09/2021   Visual impairment 02/09/2021   PCP:  Janie Morning, DO Pharmacy:   [id number] [id number]: [phone]: [phone]    Social Determinants of Health (SDOH) Social History: SDOH Screenings   Food Insecurity: No Food Insecurity (05/01/2022)  Housing: Low Risk  (05/01/2022)  Transportation Needs: No Transportation Needs (05/01/2022)  Utilities: Not At Risk (05/01/2022)  Depression (PHQ2-9): Low Risk  (04/02/2021)  Tobacco Use: Medium Risk (04/29/2022)   SDOH Interventions:     Readmission Risk Interventions    05/02/2022   10:09 AM  Readmission Risk Prevention Plan  Transportation Screening  Complete  PCP or Specialist Appt within 5-7 Days Complete  Home Care Screening Complete  Medication Review (RN CM) Complete

## 2022-06-07 NOTE — Evaluation (Signed)
Occupational Therapy Evaluation Patient Details Name: Dustin Duffy MRN: [id number] DOB: January 28, 1956 Today's Date: 06/07/2022   History of Present Illness Dustin Duffy is a 67 y.o. male presented to hospital with frequent falls. PHMx: HTN, HLD, DM-2, peripheral neuropathy, chronic indwelling Foley catheter, BPH, legally blind   Clinical Impression   This 67 yo male admitted with above presents to acute OT with PLOF (until recent frequent falls) of independence with basic ADLs in his home with some A for IADLs due to his legal blindness. Currently he is showing a significant drop in his BP when he stands up but is asymptomatic. Due to his frequent falls he is at a Setup/S-min A level with RW for ADLs. He will continue to benefit from acute OT with continuing follow up [place].     Recommendations for follow up therapy are one component of a multi-disciplinary discharge planning process, led by the attending physician.  Recommendations may be updated based on patient status, additional functional criteria and insurance authorization.   Follow Up Recommendations  Home health OT     Assistance Recommended at Discharge PRN  Patient can return home with the following Assistance with cooking/housework;Help with stairs or ramp for entrance;Assist for transportation;Direct supervision/assist for financial management;Direct supervision/assist for medications management    Functional Status Assessment  Patient has had a recent decline in their functional status and demonstrates the ability to make significant improvements in function in a reasonable and predictable amount of time.  Equipment Recommendations  None recommended by OT       Precautions / Restrictions Precautions Precautions: Fall Precaution Comments: multiple falls in past 6 months Restrictions Weight Bearing Restrictions: No      Mobility Bed Mobility Overal bed mobility: Modified Independent             General bed mobility  comments: HOB up    Transfers Overall transfer level: Needs assistance Equipment used: Rolling walker (2 wheels) Transfers: Sit to/from Stand Sit to Stand: Min guard                  Balance Overall balance assessment: Mild deficits observed, not formally tested                                         ADL either performed or assessed with clinical judgement   ADL Overall ADL's : Needs assistance/impaired Eating/Feeding: Set up;Sitting   Grooming: Set up;Min guard;Standing;Wash/dry face Grooming Details (indicate cue type and reason): at sink Upper Body Bathing: Set up;Sitting   Lower Body Bathing: Min guard;Sit to/from stand   Upper Body Dressing : Set up;Sitting   Lower Body Dressing: Min guard;Sit to/from stand   Toilet Transfer: Min guard;Ambulation;Rolling walker (2 wheels) Toilet Transfer Details (indicate cue type and reason): simulated bed>walk down hallway and back>sit in recliner Toileting- Clothing Manipulation and Hygiene: Min guard;Sit to/from stand               Vision Baseline Vision/History: 2 Legally blind Additional Comments: 20/200 vision per patient            Pertinent Vitals/Pain Pain Assessment Pain Assessment: 0-10 Pain Score: 10-Worst pain ever Pain Location: B knees Pain Descriptors / Indicators: Sore Pain Intervention(s): Limited activity within patient's tolerance, Monitored during session, Repositioned     Hand Dominance Right   Extremity/Trunk Assessment Upper Extremity Assessment Upper Extremity Assessment: Overall WFL for tasks assessed  Lower Extremity Assessment Lower Extremity Assessment: Overall WFL for tasks assessed       Communication Communication Communication: No difficulties   Cognition Arousal/Alertness: Awake/alert Behavior During Therapy: WFL for tasks assessed/performed Overall Cognitive Status: Within Functional Limits for tasks assessed                                        General Comments  See flow sheet for orthostatics--pt's BP did drop significantly when he stood at one minute (but not symptomatic)            Home Living Family/patient expects to be discharged to:: Private residence Living Arrangements: Alone Available Help at Discharge: Family;Friend(s);Available PRN/intermittently Type of Home: House Home Access: Stairs to enter [employer] of Steps: 3+1 Entrance Stairs-Rails: Right;Left;Can reach both Home Layout: One level     Bathroom Shower/Tub: Occupational psychologist: Handicapped height Bathroom Accessibility: Yes   Home Equipment: Rollator (4 wheels)          Prior Functioning/Environment Prior Level of Function : Independent/Modified Independent             Mobility Comments: pt reports use of rollator or SPC ADLs Comments: takes care of small dog at home        OT Problem List: Impaired balance (sitting and/or standing);Pain      OT Treatment/Interventions: Self-care/ADL training;DME and/or AE instruction;Patient/family education;Balance training    OT Goals(Current goals can be found in the care plan section) Acute Rehab OT Goals Patient Stated Goal: to go home tomorrow OT Goal Formulation: With patient Time For Goal Achievement: 06/21/22 Potential to Achieve Goals: Good  OT Frequency: Min 2X/week    Co-evaluation PT/OT/SLP Co-Evaluation/Treatment: Yes Reason for Co-Treatment: For patient/therapist safety;To address functional/ADL transfers PT goals addressed during session: Mobility/safety with mobility;Balance;Proper use of DME;Strengthening/ROM OT goals addressed during session: Strengthening/ROM;ADL's and self-care      AM-PAC OT "6 Clicks" Daily Activity     Outcome Measure Help from another person eating meals?: A Little Help from another person taking care of personal grooming?: A Little Help from another person toileting, which includes using toliet, bedpan,  or urinal?: A Little Help from another person bathing (including washing, rinsing, drying)?: A Little Help from another person to put on and taking off regular upper body clothing?: A Little Help from another person to put on and taking off regular lower body clothing?: A Little 6 Click Score: 18   End of Session Equipment Utilized During Treatment: Gait belt;Rolling walker (2 wheels) Nurse Communication: Mobility status  Activity Tolerance: Patient tolerated treatment well Patient left: in chair;with call bell/phone within reach;with chair alarm set  OT Visit Diagnosis: Unsteadiness on feet (R26.81);Other abnormalities of gait and mobility (R26.89);Pain;Low vision, both eyes (H54.2) Pain - Right/Left:  (both) Pain - part of body: Knee                Time: 1004-1030 OT Time Calculation (min): 26 min Charges:  OT General Charges $OT Visit: 1 Visit OT Evaluation $OT Eval Moderate Complexity: 1 [place] Office [phone]    Almon Register 06/07/2022, 10:44 AM

## 2022-06-08 DIAGNOSIS — R296 Repeated falls: Secondary | ICD-10-CM | POA: Diagnosis not present

## 2022-06-08 LAB — CK: Total CK: 288 U/L (ref 49–397)

## 2022-06-08 LAB — GLUCOSE, CAPILLARY
Glucose-Capillary: 111 mg/dL — ABNORMAL HIGH (ref 70–99)
Glucose-Capillary: 176 mg/dL — ABNORMAL HIGH (ref 70–99)
Glucose-Capillary: 115 mg/dL — ABNORMAL HIGH (ref 70–99)
Glucose-Capillary: 238 mg/dL — ABNORMAL HIGH (ref 70–99)

## 2022-06-08 MED ORDER — POLYETHYLENE GLYCOL 3350 17 G PO PACK
17.0000 g | PACK | Freq: Every day | ORAL | Status: DC
  Administered 2022-06-08: 17 g via ORAL
  Filled 2022-06-08: qty 1

## 2022-06-08 MED ORDER — FLUDROCORTISONE ACETATE 0.1 MG PO TABS
0.1000 mg | ORAL_TABLET | Freq: Every day | ORAL | Status: DC
  Administered 2022-06-08 – 2022-06-09 (×2): 0.1 mg via ORAL
  Filled 2022-06-08 (×2): qty 1

## 2022-06-08 NOTE — Plan of Care (Signed)
Education provided

## 2022-06-08 NOTE — Plan of Care (Signed)
Orthostatic BP  LYING: 128/78     HR 93  SITTING: 132/81     HR 101  STANDING: 107/70     HR 122

## 2022-06-08 NOTE — Progress Notes (Signed)
PROGRESS NOTE  Dustin Duffy  [id number] DOB: 1955/10/08 DOA: 06/06/2022 PCP: Janie Morning, DO   Brief Narrative:  Patient is a 67 year old male with history of hypertension, hyperlipidemia, diabetes type 2, peripheral neuropathy, chronic indwelling Foley catheter, BPH, legally blind who presented from home with frequent falls.  He lives by himself but has a friend and 2 sisters who help him out.  Report of falling 5 times at home.  On presentation: blood pressure was soft, given IV fluids.   PT/OT consulted,recommended HH.  Found to be altered static.  Continued on IV fluid, also started on midodrine and fludrocortisone  Assessment & Plan:  Principal Problem:   Falls frequently Active Problems:   Diabetes mellitus type 2 in nonobese [facility])   Dyslipidemia   Essential hypertension   BPH with urinary obstruction   Foley catheter in place prior to arrival   Frequent falls/orthostatic hypotension: Was admitted last time in February for same problem.  Frequent falls at home.  Lives alone.  Blood pressure was soft on presentation.  He is orthostatic and this is the most likely contributing  factor.  Antihypertensives on hold,started low-dose midodrine, .compression stockings. Orthostatic blood pressure taken on 3/13 again positive.  Continue IV fluid, added fludrocortisone. Check orthostatic vitals tomorrow  Elevated CK: CK was elevated at 932.  Resolved with IV fluids.  AKI: Presented with creatinine of 1.4.  Resolved with IV fluids.  Asymptomatic bacteriuria: UA was suspicious for UTI.  Afebrile, no leukocytosis, no dysuria.  Has chronic indwelling Foley catheter.  Given a dose of ceftriaxone, currently on hold.  Monitor off antibiotics  Hypertension: Blood pressure was soft on presentation.  Takes metoprolol at home which is on hold.  Diabetes type 2: Takes glipizide, Ozempic, xigduo at home.  Currently on sliding scale insulin.  Monitor blood sugars.  History of BPH/chronic  indwelling Foley catheter: Exchanged in the emergency department.  On Flomax.  Flomax will be  discontinued for now because he is orthostatic  Hyperlipidemia: Continue statin  Bilateral knee pain:  Falls on his knees usually, has erythema bilaterally  ,complains of pain but no edema or tenderness noted.   x-ray of bilateral knees did not show fracture or dislocation  Debility/deconditioning: Patient is legally blind.Lives alone.  History of frequent falls. [facility] in [facility] for the same and was found to have T 12  compression fracture,S3/S4 fracture.  Not interested in facility placement in the past and not interested now too.  PT/OT evaluation done, recommended home health on discharge         DVT prophylaxis:enoxaparin (LOVENOX) injection 40 mg Start: 06/06/22 2200     Code Status: DNR  Family Communication: Called and discussed with sister Caren Griffins on phone on 3/12.Called again today,call not received  Patient status:Inpatient  Patient is from :Home  Anticipated discharge RC:[id number] with [facility]  Estimated DC date:tomorrow if not orthostatic   Consultants: None  Procedures:None  Antimicrobials:  Anti-infectives (From admission, onward)    Start     Dose/Rate Route Frequency Ordered Stop   06/06/22 1600  cefTRIAXone (ROCEPHIN) 1 g in sodium chloride 0.9 % 100 mL IVPB        1 g 200 mL/hr over 30 Minutes Intravenous  Once 06/06/22 1556 06/06/22 1706       Subjective: Patient seen and examined at bedside today.  Hemodynamically stable.  Lying on the bed.  Comfortable.  His blood pressure again dropped when orthostatics were checked this morning.  He denies any dizziness or  lightheadedness during that.  He is agreeable for continuing IV fluids and staying 1 more night  Objective: Vitals:   06/08/22 0456 06/08/22 0750 06/08/22 0752 06/08/22 0759  BP: 108/71 128/78 132/81 107/70  Pulse: 86 93 (!) 101 (!) 122  Resp: '15 18 20 20  '$ Temp: 97.9 F (36.6 C) (!) 97.5 F  (36.4 C) (!) 97.5 F (36.4 C) (!) 97.5 F (36.4 C)  TempSrc: Oral Oral Oral Oral  SpO2: 95% 96% 96% 95%  Weight:        Intake/Output Summary (Last 24 hours) at 06/08/2022 1137 Last data filed at 06/08/2022 0759 Gross per 24 hour  Intake 3441.42 ml  Output 3150 ml  Net 291.42 ml   Filed Weights   06/06/22 1206  Weight: 63.5 kg    Examination:  General exam: Overall comfortable, not in distress, pleasant gentleman HEENT: Legally blind Respiratory system:  no wheezes or crackles  Cardiovascular system: S1 & S2 heard, RRR.  Gastrointestinal system: Abdomen is nondistended, soft and nontender. Central nervous system: Alert and oriented Extremities: No edema, no clubbing ,no cyanosis,  Skin: No rashes, no ulcers,no icterus ,erythematous changes on the bilateral knees GU: Foley   Data Reviewed: I have personally reviewed following labs and imaging studies  CBC: Recent Labs  Lab 06/06/22 1218 06/07/22 0517  WBC 9.1 6.1  NEUTROABS 6.6  --   HGB 12.0* 10.7*  HCT 37.1* 33.3*  MCV 87.3 88.1  PLT 239 [id number]   Basic Metabolic Panel: Recent Labs  Lab 06/06/22 1218 06/07/22 0517  NA 131* 134*  K 5.0 4.1  CL 100 101  CO2 23 24  GLUCOSE 133* 92  BUN 33* 22  CREATININE 1.40* 1.12  CALCIUM 8.2* 8.2*     Recent Results (from the past 240 hour(s))  Urine Culture     Status: None   Collection Time: 06/06/22  4:00 PM   Specimen: Urine, Clean Catch  Result Value Ref Range Status   Specimen Description   Final    URINE, CLEAN CATCH Performed at [facility], [place] [address] [place], [place] [address]    Special Requests   Final    NONE Performed at [facility], [place] [address], [place], [place] [address]    Culture   Final    NO GROWTH Performed at [facility], [place] [address] [place], [place] [address]    Report Status 06/07/2022 FINAL  Final     Radiology Studies: DG Knee 1-2 Views Left  Result Date:  06/07/2022 CLINICAL DATA:  Ovaries and falls with bruising and swelling of both knees. EXAM: LEFT KNEE - 1-2 VIEW COMPARISON:  None Available. FINDINGS: Minimal superior patellar degenerative spurring. No joint effusion. The medial and lateral compartment joint spaces are maintained. Partial visualization of a lobular sclerotic lesion within the mid to distal femoral diaphysis, measuring at least 4 cm in craniocaudal dimension and 1.4 cm in AP dimension. This is seen only on lateral view. This may contain a chondroid matrix compatible with an enchondroma. A bone infarct can have a similar appearance. No suspicious surrounding features such as surrounding cortical destruction. No acute fracture or dislocation. Mild-to-moderate diffuse anterior knee subcutaneous fat edema and swelling. IMPRESSION: Mild-to-moderate diffuse anterior knee subcutaneous fat edema and swelling. No acute fracture is seen. Electronically Signed   By: Yvonne Kendall M.D.   On: 06/07/2022 12:43   DG Knee 1-2 Views Right  Result Date: 06/07/2022 CLINICAL DATA:  Multiple recent  falls with bruising and swelling to the bilateral knees. Bilateral knee pain. EXAM: RIGHT KNEE - 1-2 VIEW COMPARISON:  None Available. FINDINGS: No joint effusion. Minimal inferior patellar degenerative spurring. Mild diffuse anterior knee subcutaneous fat edema and swelling. No acute fracture or dislocation. IMPRESSION: Mild diffuse anterior knee subcutaneous fat edema and swelling. No acute fracture. Electronically Signed   By: Yvonne Kendall M.D.   On: 06/07/2022 12:40   DG Chest Port 1 View  Result Date: 06/06/2022 CLINICAL DATA:  Multiple falls EXAM: PORTABLE CHEST 1 VIEW COMPARISON:  None Available. FINDINGS: Normal mediastinum and cardiac silhouette. Normal pulmonary vasculature. No evidence of effusion, infiltrate, or pneumothorax. No acute bony abnormality. IMPRESSION: No acute cardiopulmonary process. Electronically Signed   By: Suzy Bouchard M.D.   On:  06/06/2022 13:10    Scheduled Meds:  amitriptyline  10 mg Oral QHS   aspirin EC  81 mg Oral Daily   Chlorhexidine Gluconate Cloth  6 each Topical Daily   enoxaparin (LOVENOX) injection  40 mg Subcutaneous Q24H   fludrocortisone  0.1 mg Oral Daily   insulin aspart  0-9 Units Subcutaneous TID WC   midodrine  5 mg Oral TID WC   polyethylene glycol  17 g Oral Daily   simvastatin  20 mg Oral QHS   Continuous Infusions:  sodium chloride 125 mL/hr at 06/08/22 0637     LOS: 1 day   Shelly Coss, MD [facility] P3/13/2024, 11:37 AM

## 2022-06-08 NOTE — Progress Notes (Addendum)
Occupational Therapy Treatment Patient Details Name: Dustin Duffy MRN: [id number] DOB: November 20, 1955 Today's Date: 06/08/2022   History of present illness Dustin Duffy is a 67 y.o. male presented to hospital with frequent falls. PHMx: HTN, HLD, DM-2, peripheral neuropathy, chronic indwelling Foley catheter, BPH, legally blind   OT comments  Patient was limited by pain in back and bilateral knees today. Patient was educated on importance of movement and getting to chair for meals. Patient verbalized understanding but declined. Patient was educated on proper body mechanics in bed to reduce twisting at bed level and reduce back pain. Patient demonstrated understanding. Patient did indicate having trouble with medications at home with sister assisting some secondary to visual deficits. Patient will need someone to assist with medication management in next level of care as patient reported " I dont need to know the names of medications I can't see them".  Patient was educated on need for A at all times with meds patient verbalized understanding. Patient's discharge plan remains appropriate at this time. OT will continue to follow acutely.      Recommendations for follow up therapy are one component of a multi-disciplinary discharge planning process, led by the attending physician.  Recommendations may be updated based on patient status, additional functional criteria and insurance authorization.    Follow Up Recommendations  Home health OT     Assistance Recommended at Discharge PRN  Patient can return home with the following  Assistance with cooking/housework;Help with stairs or ramp for entrance;Assist for transportation;Direct supervision/assist for financial management;Direct supervision/assist for medications management   Equipment Recommendations  None recommended by OT       Precautions / Restrictions Precautions Precautions: Fall Precaution Comments: multiple falls in past 6  months Restrictions Weight Bearing Restrictions: No              ADL either performed or assessed with clinical judgement   ADL Overall ADL's : Needs assistance/impaired                                       General ADL Comments: Patient reported increased pain in back today with noted twisting in current position in bed. patient was provided with verbal and visual demonstration on how to follow proper body mechanics in bed to reduce twisting on back. patient was provided with additional pillows for between legs or under knees pending the positioning in bed. patient verbalized understanding. patient declined to get out of bed at this time reporting that he was in too much pain. patient was educated on importance of getting out of bed each day for meals and sitting up for short periods of time v.s. sitting up all day. patient verbalized understanding but continued to decline to get out of bed.      Cognition Arousal/Alertness: Awake/alert Behavior During Therapy: WFL for tasks assessed/performed Overall Cognitive Status: Within Functional Limits for tasks assessed                           Pertinent Vitals/ Pain       Pain Assessment Pain Assessment: 0-10 Pain Score: 8  Pain Location: B knees Pain Descriptors / Indicators: Sore Pain Intervention(s): Limited activity within patient's tolerance, Monitored during session, Repositioned         Frequency  Min 2X/week        Progress Toward Goals  OT Goals(current goals  can now be found in the care plan section)  Progress towards OT goals: Not progressing toward goals - comment     Plan Discharge plan remains appropriate       AM-PAC OT "6 Clicks" Daily Activity     Outcome Measure   Help from another person eating meals?: A Little Help from another person taking care of personal grooming?: A Little Help from another person toileting, which includes using toliet, bedpan, or urinal?: A  Little Help from another person bathing (including washing, rinsing, drying)?: A Little Help from another person to put on and taking off regular upper body clothing?: A Little Help from another person to put on and taking off regular lower body clothing?: A Little 6 Click Score: 18    End of Session    OT Visit Diagnosis: Unsteadiness on feet (R26.81);Other abnormalities of gait and mobility (R26.89);Pain;Low vision, both eyes (H54.2) Pain - Right/Left:  (both) Pain - part of body: Knee   Activity Tolerance Patient limited by pain   Patient Left in bed;with call bell/phone within reach   Nurse Communication Other (comment) (ok to participate in session)        Time: 1150-1202 OT Time Calculation (min): 12 min  Charges: OT General Charges $OT Visit: 1 Visit OT Treatments $Self Care/Home Management : 8-22 mins  Dustin Plowman, MS Acute Rehabilitation Department Office# [phone]   Willa Rough 06/08/2022, 12:17 PM

## 2022-06-09 DIAGNOSIS — R296 Repeated falls: Secondary | ICD-10-CM | POA: Diagnosis not present

## 2022-06-09 LAB — BASIC METABOLIC PANEL
Anion gap: 9 (ref 5–15)
BUN: 19 mg/dL (ref 8–23)
CO2: 25 mmol/L (ref 22–32)
Calcium: 8.2 mg/dL — ABNORMAL LOW (ref 8.9–10.3)
Chloride: 99 mmol/L (ref 98–111)
Creatinine, Ser: 1 mg/dL (ref 0.61–1.24)
GFR, Estimated: >60 mL/min (ref >60)
Glucose, Bld: 285 mg/dL — ABNORMAL HIGH (ref 70–99)
Potassium: 4.1 mmol/L (ref 3.5–5.1)
Sodium: 133 mmol/L — ABNORMAL LOW (ref 135–145)

## 2022-06-09 LAB — GLUCOSE, CAPILLARY: Glucose-Capillary: 126 mg/dL — ABNORMAL HIGH (ref 70–99)

## 2022-06-09 MED ORDER — AMITRIPTYLINE HCL 10 MG PO TABS: 10.0000 mg | ORAL_TABLET | Freq: Every day | ORAL | 0 refills | Status: DC

## 2022-06-09 MED ORDER — OXYCODONE HCL 5 MG PO TABS: 5.0000 mg | ORAL_TABLET | Freq: Four times a day (QID) | ORAL | 0 refills | Status: DC | PRN

## 2022-06-09 MED ORDER — MIDODRINE HCL 5 MG PO TABS: 5.0000 mg | ORAL_TABLET | Freq: Three times a day (TID) | ORAL | 1 refills | Status: AC

## 2022-06-09 MED ORDER — FLUDROCORTISONE ACETATE 0.1 MG PO TABS: 0.1000 mg | ORAL_TABLET | Freq: Every day | ORAL | 1 refills | Status: DC

## 2022-06-09 NOTE — Discharge Summary (Addendum)
Physician Discharge Summary  Dustin Duffy [id number] DOB: [birth year] DOA: 06/06/2022  PCP: Janie Morning, DO  Admit date: 06/06/2022 Discharge date: 06/09/2022  Admitted From: Home Disposition:  Home  Discharge Condition:Stable CODE STATUS:DNR Diet recommendation: Carb Modified   Brief/Interim Summary:  Patient is a 67 year old male with history of hypertension, hyperlipidemia, diabetes type 2, peripheral neuropathy, chronic indwelling Foley catheter, BPH, legally blind who presented from home with frequent falls.  He lives by himself but has a friend and 2 sisters who help him out.  Report of falling 5 times at home.  On presentation: blood pressure was soft, given IV fluids.   PT/OT consulted,recommended HH.  Found to be orthostatic.  Continued on IV fluid, also started on midodrine and fludrocortisone.  Got 2 days of IV fluid at 138m per hour.  Orthostatic again check this morning is positive.He did not have any dizziness or lightheadedness when the blood pressure was falling.  This is most likely due to autonomic dysfunction.  He has been started on fludrocortisone and midodrine.  Antihypertensives discontinued.  Patient not willing to stay another night in the hospital and states he desperately wants to go home to take care of his dog.  He has high risk for fall at home.  We have discussed extensively about being careful on ambulation, continue midodrine/Florinef and compression stockings.  He will follow-up with home health.  Following problems were addressed during the hospitalization:  Frequent falls/orthostatic hypotension: Was admitted last time in February for same problem.  Frequent falls at home.  Lives alone.  Blood pressure was soft on presentation.  Metoprolol, tamsulosin discontinued.  Started on midodrine and Florinef.he has been advised to wear compression stockings while on ambulation. Orthostatic blood pressure taken this morning is again positive.  This is most likely from  autonomic dysfunction.    Elevated CK: CK was elevated at 932.  Resolved with IV fluids.   AKI: Presented with creatinine of 1.4.  Resolved with IV fluids.   Asymptomatic bacteriuria: UA was suspicious for UTI.  Afebrile, no leukocytosis, no dysuria.  Has chronic indwelling Foley catheter.  Given a dose of ceftriaxone, currently on hold.  Monitor off antibiotics   Hypertension: Blood pressure was soft on presentation.  Takes metoprolol at home which is on hold.  Continue midodrine, Florinef   Diabetes type 2: Takes glipizide, Ozempic, xigduo at home.  Monitor blood sugars at home   History of BPH/chronic indwelling Foley catheter: Exchanged in the emergency department.  On Flomax.  Flomax will be  discontinued for now because he is orthostatic   Hyperlipidemia: Continue statin   Bilateral knee pain:  Falls on his knees has  erythema bilaterally  ,complains of pain but no edema or tenderness noted.   x-ray of bilateral knees did not show fracture or dislocation   Debility/deconditioning: Patient is legally blind.Lives alone.  History of frequent falls. .Marland Kitchenast hospitalized in February for the same and was found to have T 12  compression fracture,S3/S4 fracture.  Not interested in facility placement in the past and not interested now too.  PT/OT evaluation done, recommended home health on discharge .  Remains high risk for fall    Discharge Diagnoses:  Principal Problem:   Falls frequently Active Problems:   Diabetes mellitus type 2 in nonobese ([facility]   Dyslipidemia   Essential hypertension   BPH with urinary obstruction   Foley catheter in place prior to arrival    Discharge Instructions  Discharge Instructions     Diet  Carb Modified   Complete by: As directed    Discharge instructions   Complete by: As directed    1)Please take prescribed medications as instructed 2)Follow up with your PCP in a week.  Follow-up with home health 3)You have very high risks for fall.  Do not get  abruptly from lying or sitting position.  Be very careful on ambulation.  Wear the compression stockings all the time while ambulating.  Monitor blood pressure at home.Take plenty of fluid to hydrate yourself   Increase activity slowly   Complete by: As directed       Allergies as of 06/09/2022   No Known Allergies      Medication List     STOP taking these medications    fluconazole 200 MG tablet Commonly known as: DIFLUCAN   metoprolol tartrate 25 MG tablet Commonly known as: LOPRESSOR   tamsulosin 0.4 MG Caps capsule Commonly known as: FLOMAX       TAKE these medications    amitriptyline 10 MG tablet Commonly known as: ELAVIL Take 1 tablet (10 mg total) by mouth at bedtime. What changed:  medication strength how much to take   aspirin EC 81 MG tablet Take 81 mg by mouth in the morning. Swallow whole.   cholecalciferol 25 MCG (1000 UNIT) tablet Commonly known as: VITAMIN D3 Take 1,000 Units by mouth 3 (three) times a week.   fludrocortisone 0.1 MG tablet Commonly known as: FLORINEF Take 1 tablet (0.1 mg total) by mouth daily.   gabapentin 300 MG capsule Commonly known as: NEURONTIN Take 300 mg by mouth 3 (three) times daily as needed (for pain).   glipiZIDE 2.5 MG 24 hr tablet Commonly known as: GLUCOTROL XL Take 2.5 mg by mouth daily with breakfast.   ipratropium 0.03 % nasal spray Commonly known as: ATROVENT Place 2 sprays into both nostrils 2 (two) times daily as needed for rhinitis.   midodrine 5 MG tablet Commonly known as: PROAMATINE Take 1 tablet (5 mg total) by mouth 3 (three) times daily with meals.   oxyCODONE 5 MG immediate release tablet Commonly known as: Oxy IR/ROXICODONE Take 1 tablet (5 mg total) by mouth every 6 (six) hours as needed for moderate pain.   OZEMPIC (0.25 OR 0.5 MG/DOSE) [place] Inject 0.5 mg into the skin every Wednesday.   simvastatin 20 MG tablet Commonly known as: ZOCOR Take 1 tablet (20 mg total) by mouth every  evening. Do not take this medication until you finish Diflucan. What changed:  when to take this additional instructions   Xigduo XR 10-500 MG Tb24 Generic drug: Dapagliflozin Pro-metFORMIN ER Take 1 tablet by mouth daily before breakfast.        Follow-up Information     Janie Morning, DO. Schedule an appointment as soon as possible for a visit in 1 week(s).   Specialty: Family Medicine Contact information: [address] [place] [address] [phone]                No Known Allergies  Consultations: None   Procedures/Studies: DG Knee 1-2 Views Left  Result Date: 06/07/2022 CLINICAL DATA:  Ovaries and falls with bruising and swelling of both knees. EXAM: LEFT KNEE - 1-2 VIEW COMPARISON:  None Available. FINDINGS: Minimal superior patellar degenerative spurring. No joint effusion. The medial and lateral compartment joint spaces are maintained. Partial visualization of a lobular sclerotic lesion within the mid to distal femoral diaphysis, measuring at least 4 cm in craniocaudal dimension and 1.4  cm in AP dimension. This is seen only on lateral view. This may contain a chondroid matrix compatible with an enchondroma. A bone infarct can have a similar appearance. No suspicious surrounding features such as surrounding cortical destruction. No acute fracture or dislocation. Mild-to-moderate diffuse anterior knee subcutaneous fat edema and swelling. IMPRESSION: Mild-to-moderate diffuse anterior knee subcutaneous fat edema and swelling. No acute fracture is seen. Electronically Signed   By: Yvonne Kendall M.D.   On: 06/07/2022 12:43   DG Knee 1-2 Views Right  Result Date: 06/07/2022 CLINICAL DATA:  Multiple recent falls with bruising and swelling to the bilateral knees. Bilateral knee pain. EXAM: RIGHT KNEE - 1-2 VIEW COMPARISON:  None Available. FINDINGS: No joint effusion. Minimal inferior patellar degenerative spurring. Mild diffuse anterior knee subcutaneous  fat edema and swelling. No acute fracture or dislocation. IMPRESSION: Mild diffuse anterior knee subcutaneous fat edema and swelling. No acute fracture. Electronically Signed   By: Yvonne Kendall M.D.   On: 06/07/2022 12:40   DG Chest Port 1 View  Result Date: 06/06/2022 CLINICAL DATA:  Multiple falls EXAM: PORTABLE CHEST 1 VIEW COMPARISON:  None Available. FINDINGS: Normal mediastinum and cardiac silhouette. Normal pulmonary vasculature. No evidence of effusion, infiltrate, or pneumothorax. No acute bony abnormality. IMPRESSION: No acute cardiopulmonary process. Electronically Signed   By: Suzy Bouchard M.D.   On: 06/06/2022 13:10      Subjective: Patient seen and examined at bedside today.  Remains comfortable, alert and oriented, very clear in his mind.  When we checked orthostatic vitals, it was positive.  He did not have any dizziness or lightheadedness when the blood pressure was falling.  Desperately wants to go home today.  I called his sister and explained extensively about our instructions.  Discharge Exam: Vitals:   06/09/22 0841 06/09/22 1125  BP: (!) 86/55 (!) 146/81  Pulse:  89  Resp:  20  Temp:  98.6 F (37 C)  SpO2:  94%   Vitals:   06/09/22 0831 06/09/22 0837 06/09/22 0841 06/09/22 1125  BP: 131/78 127/65 (!) 86/55 (!) 146/81  Pulse: 96 (!) 117  89  Resp:    20  Temp:    98.6 F (37 C)  TempSrc:    Oral  SpO2:    94%  Weight:        General: Pt is alert, awake, not in acute distress,very pleasant gentleman,legally blind Cardiovascular: RRR, S1/S2 +, no rubs, no gallops Respiratory: CTA bilaterally, no wheezing, no rhonchi Abdominal: Soft, NT, ND, bowel sounds + Extremities: no edema, no cyanosis GU: Foley    The results of significant diagnostics from this hospitalization (including imaging, microbiology, ancillary and laboratory) are listed below for reference.     Microbiology: Recent Results (from the past 240 hour(s))  Urine Culture     Status:  None   Collection Time: 06/06/22  4:00 PM   Specimen: Urine, Clean Catch  Result Value Ref Range Status   Specimen Description   Final    URINE, CLEAN CATCH Performed at [facility], [place] [address] [place], [place] [address]    Special Requests   Final    NONE Performed at [facility], [place] [address], [place], [place] [address]    Culture   Final    NO GROWTH Performed at [facility], [place] [address] [place], [place] [address]    Report Status 06/07/2022 FINAL  Final     Labs: BNP (last 3 results) No results  for input(s): "BNP" in the last 8760 hours. Basic Metabolic Panel: Recent Labs  Lab 06/06/22 1218 06/07/22 0517 06/09/22 0922  NA 131* 134* 133*  K 5.0 4.1 4.1  CL 100 101 99  CO2 '23 24 25  '$ GLUCOSE 133* 92 285*  BUN 33* 22 19  CREATININE 1.40* 1.12 1.00  CALCIUM 8.2* 8.2* 8.2*   Liver Function Tests: Recent Labs  Lab 06/06/22 1218  AST 29  ALT 13  ALKPHOS 86  BILITOT 1.0  PROT 6.2*  ALBUMIN 3.5   No results for input(s): "LIPASE", "AMYLASE" in the last 168 hours. No results for input(s): "AMMONIA" in the last 168 hours. CBC: Recent Labs  Lab 06/06/22 1218 06/07/22 0517  WBC 9.1 6.1  NEUTROABS 6.6  --   HGB 12.0* 10.7*  HCT 37.1* 33.3*  MCV 87.3 88.1  PLT 239 218   Cardiac Enzymes: Recent Labs  Lab 06/06/22 1843 06/08/22 0516  CKTOTAL 932* 288   BNP: Invalid input(s): "POCBNP" CBG: Recent Labs  Lab 06/08/22 0746 06/08/22 1122 06/08/22 1621 06/08/22 2118 06/09/22 0744  GLUCAP 111* 176* 115* 238* 126*   D-Dimer No results for input(s): "DDIMER" in the last 72 hours. Hgb A1c No results for input(s): "HGBA1C" in the last 72 hours. Lipid Profile No results for input(s): "CHOL", "HDL", "LDLCALC", "TRIG", "CHOLHDL", "LDLDIRECT" in the last 72 hours. Thyroid function studies No results for input(s): "TSH", "T4TOTAL", "T3FREE", "THYROIDAB" in the last 72 hours.  Invalid input(s):  "FREET3" Anemia work up No results for input(s): "VITAMINB12", "FOLATE", "FERRITIN", "TIBC", "IRON", "RETICCTPCT" in the last 72 hours. Urinalysis    Component Value Date/Time   COLORURINE YELLOW 06/06/2022 1154   APPEARANCEUR CLOUDY (A) 06/06/2022 1154   LABSPEC 1.020 06/06/2022 1154   PHURINE 6.0 06/06/2022 1154   GLUCOSEU >1,000 (A) 06/06/2022 1154   HGBUR MODERATE (A) 06/06/2022 1154   BILIRUBINUR NEGATIVE 06/06/2022 1154   KETONESUR NEGATIVE 06/06/2022 1154   PROTEINUR NEGATIVE 06/06/2022 1154   NITRITE NEGATIVE 06/06/2022 1154   LEUKOCYTESUR MODERATE (A) 06/06/2022 1154   Sepsis Labs Recent Labs  Lab 06/06/22 1218 06/07/22 0517  WBC 9.1 6.1   Microbiology Recent Results (from the past 240 hour(s))  Urine Culture     Status: None   Collection Time: 06/06/22  4:00 PM   Specimen: Urine, Clean Catch  Result Value Ref Range Status   Specimen Description   Final    URINE, CLEAN CATCH Performed at [facility], [place] [address] [place], [place] [address]    Special Requests   Final    NONE Performed at [facility], [place] [address], [place], [place] [address]    Culture   Final    NO GROWTH Performed at [facility], [place] [address], [place], [place] [address]    Report Status 06/07/2022 FINAL  Final    Please note: You were cared for by a hospitalist during your hospital stay. Once you are discharged, your primary care physician will handle any further medical issues. Please note that NO REFILLS for any discharge medications will be authorized once you are discharged, as it is imperative that you return to your primary care physician (or establish a relationship with a primary care physician if you do not have one) for your post hospital discharge needs so that they can reassess your need for medications and monitor your lab values.    Time coordinating discharge: 40 minutes  SIGNED:   Shelly Coss, MD  [facility] 06/09/2022, 1:00 PM Pager [phone]  If 7PM-7AM, please contact night-coverage [email or web address] Password [email or web address]

## 2022-06-11 DIAGNOSIS — N138 Other obstructive and reflux uropathy: Secondary | ICD-10-CM | POA: Diagnosis not present

## 2022-06-11 DIAGNOSIS — N401 Enlarged prostate with lower urinary tract symptoms: Secondary | ICD-10-CM | POA: Diagnosis not present

## 2022-06-11 DIAGNOSIS — S2242XD Multiple fractures of ribs, left side, subsequent encounter for fracture with routine healing: Secondary | ICD-10-CM | POA: Diagnosis not present

## 2022-06-11 DIAGNOSIS — N179 Acute kidney failure, unspecified: Secondary | ICD-10-CM | POA: Diagnosis not present

## 2022-06-11 DIAGNOSIS — Z466 Encounter for fitting and adjustment of urinary device: Secondary | ICD-10-CM | POA: Diagnosis not present

## 2022-06-11 DIAGNOSIS — N390 Urinary tract infection, site not specified: Secondary | ICD-10-CM | POA: Diagnosis not present

## 2022-06-14 DIAGNOSIS — E1140 Type 2 diabetes mellitus with diabetic neuropathy, unspecified: Secondary | ICD-10-CM | POA: Diagnosis not present

## 2022-06-14 DIAGNOSIS — Z978 Presence of other specified devices: Secondary | ICD-10-CM | POA: Diagnosis not present

## 2022-06-14 DIAGNOSIS — R296 Repeated falls: Secondary | ICD-10-CM | POA: Diagnosis not present

## 2022-06-14 DIAGNOSIS — N400 Enlarged prostate without lower urinary tract symptoms: Secondary | ICD-10-CM | POA: Diagnosis not present

## 2022-06-14 DIAGNOSIS — R2681 Unsteadiness on feet: Secondary | ICD-10-CM | POA: Diagnosis not present

## 2022-06-14 DIAGNOSIS — I959 Hypotension, unspecified: Secondary | ICD-10-CM | POA: Diagnosis not present

## 2022-06-14 DIAGNOSIS — Z09 Encounter for follow-up examination after completed treatment for conditions other than malignant neoplasm: Secondary | ICD-10-CM | POA: Diagnosis not present

## 2022-06-15 DIAGNOSIS — N401 Enlarged prostate with lower urinary tract symptoms: Secondary | ICD-10-CM | POA: Diagnosis not present

## 2022-06-15 DIAGNOSIS — N179 Acute kidney failure, unspecified: Secondary | ICD-10-CM | POA: Diagnosis not present

## 2022-06-15 DIAGNOSIS — Z466 Encounter for fitting and adjustment of urinary device: Secondary | ICD-10-CM | POA: Diagnosis not present

## 2022-06-15 DIAGNOSIS — N138 Other obstructive and reflux uropathy: Secondary | ICD-10-CM | POA: Diagnosis not present

## 2022-06-15 DIAGNOSIS — S2242XD Multiple fractures of ribs, left side, subsequent encounter for fracture with routine healing: Secondary | ICD-10-CM | POA: Diagnosis not present

## 2022-06-15 DIAGNOSIS — N390 Urinary tract infection, site not specified: Secondary | ICD-10-CM | POA: Diagnosis not present

## 2022-06-16 DIAGNOSIS — N401 Enlarged prostate with lower urinary tract symptoms: Secondary | ICD-10-CM | POA: Diagnosis not present

## 2022-06-16 DIAGNOSIS — Z466 Encounter for fitting and adjustment of urinary device: Secondary | ICD-10-CM | POA: Diagnosis not present

## 2022-06-16 DIAGNOSIS — N390 Urinary tract infection, site not specified: Secondary | ICD-10-CM | POA: Diagnosis not present

## 2022-06-16 DIAGNOSIS — S2242XD Multiple fractures of ribs, left side, subsequent encounter for fracture with routine healing: Secondary | ICD-10-CM | POA: Diagnosis not present

## 2022-06-16 DIAGNOSIS — N138 Other obstructive and reflux uropathy: Secondary | ICD-10-CM | POA: Diagnosis not present

## 2022-06-16 DIAGNOSIS — N179 Acute kidney failure, unspecified: Secondary | ICD-10-CM | POA: Diagnosis not present

## 2022-06-22 DIAGNOSIS — Z466 Encounter for fitting and adjustment of urinary device: Secondary | ICD-10-CM | POA: Diagnosis not present

## 2022-06-22 DIAGNOSIS — N138 Other obstructive and reflux uropathy: Secondary | ICD-10-CM | POA: Diagnosis not present

## 2022-06-22 DIAGNOSIS — N179 Acute kidney failure, unspecified: Secondary | ICD-10-CM | POA: Diagnosis not present

## 2022-06-22 DIAGNOSIS — N390 Urinary tract infection, site not specified: Secondary | ICD-10-CM | POA: Diagnosis not present

## 2022-06-22 DIAGNOSIS — N401 Enlarged prostate with lower urinary tract symptoms: Secondary | ICD-10-CM | POA: Diagnosis not present

## 2022-06-22 DIAGNOSIS — S2242XD Multiple fractures of ribs, left side, subsequent encounter for fracture with routine healing: Secondary | ICD-10-CM | POA: Diagnosis not present

## 2022-06-23 ENCOUNTER — Ambulatory Visit
Admission: EM | Admit: 2022-06-23 | Discharge: 2022-06-23 | Disposition: A | Discharge status: Home / Self Care | Payer: Medicare Other | Attending: Internal Medicine | Admitting: Internal Medicine

## 2022-06-23 DIAGNOSIS — Z978 Presence of other specified devices: Secondary | ICD-10-CM

## 2022-06-23 DIAGNOSIS — R34 Anuria and oliguria: Secondary | ICD-10-CM | POA: Diagnosis not present

## 2022-06-23 DIAGNOSIS — R300 Dysuria: Secondary | ICD-10-CM

## 2022-06-23 LAB — POCT URINALYSIS DIP (MANUAL ENTRY)
Bilirubin, UA: NEGATIVE
Glucose, UA: 500 mg/dL — AB
Ketones, POC UA: NEGATIVE mg/dL
Nitrite, UA: NEGATIVE
Protein Ur, POC: 100 mg/dL — AB
Spec Grav, UA: 1.02 (ref 1.010–1.025)
Urobilinogen, UA: 0.2 E.U./dL
pH, UA: 6.5 (ref 5.0–8.0)

## 2022-06-23 LAB — POCT FASTING CBG KUC MANUAL ENTRY: POCT Glucose (KUC): 176 mg/dL — AB (ref 70–99)

## 2022-06-23 NOTE — ED Triage Notes (Signed)
Pt presents to [facility] with family. Family reports they changed his urinary cath today about 2 hours ago and it was getting clogged so they changed it out and when they did that they noticed the new urine looked like cream of mushroom soup. Cath was placed sterile with a new bag 1 hour ago.

## 2022-06-23 NOTE — ED Provider Notes (Signed)
[facility]    CSN: [id number] Arrival date & time: 06/23/22  1655      History   Chief Complaint Chief Complaint  Patient presents with   Dysuria    HPI Dustin Duffy is a 67 y.o. male.   Patient presents with change in color of urine and dysuria that has been present for about 2-3 days. Patient has chronic indwelling Foley for 2 years.  Patient reports this is an intermittent issue but can change Foley catheter due to being clogged, and it typically resolves.  Patient states that his nurse changed the Foley catheter about 1 hour ago and has not resolved.  He has had decreased urine output from the Foley catheter and feels the urge to urinate still with associated dysuria.  He reports that he tried to sit down on the toilet and bear down which sometimes helps but this was not helpful either.  Was recently hospitalized 2 weeks ago and had Foley changed on day of discharge.  Patient had small amount of leukocytes at ER on UA but urine culture was normal.  They gave him IV Rocephin.  Urine culture was negative from previous hospital visit.  Patient denies fever, nausea, vomiting, chills, abdominal pain, back pain.   Dysuria   Past Medical History:  Diagnosis Date   BPH without urinary obstruction    Diabetes mellitus type 2 in nonobese [facility])    Dyslipidemia    Essential hypertension    Renal impairment    Visual impairment     Patient Active Problem List   Diagnosis Date Noted   Falls frequently 06/06/2022   Acute kidney injury ([place]) 04/29/2022   BPH with urinary obstruction 04/29/2022   Foley catheter in place prior to arrival 04/29/2022   Frequent falls 04/29/2022   Multiple fractures of ribs, left side, initial encounter for closed fracture 04/29/2022   Sacral fracture, closed ([place]) 04/29/2022   Closed wedge compression fracture of T12 vertebra ([place]) 04/29/2022   UTI (urinary tract infection) 04/29/2022   Diabetes mellitus type 2 in nonobese ([place]) 02/09/2021    Dyslipidemia 02/09/2021   Essential hypertension 02/09/2021   Renal impairment 02/09/2021   Non-traumatic rhabdomyolysis 02/09/2021   Visual impairment 02/09/2021    History reviewed. No pertinent surgical history.     Home Medications    Prior to Admission medications   Medication Sig Start Date End Date Taking? Authorizing Provider  amitriptyline (ELAVIL) 10 MG tablet Take 1 tablet (10 mg total) by mouth at bedtime. 06/09/22   Shelly Coss, MD  aspirin EC 81 MG tablet Take 81 mg by mouth in the morning. Swallow whole.    [provider]  cholecalciferol (VITAMIN D3) 25 MCG (1000 UNIT) tablet Take 1,000 Units by mouth 3 (three) times a week.    [provider]  fludrocortisone (FLORINEF) 0.1 MG tablet Take 1 tablet (0.1 mg total) by mouth daily. 06/09/22   Shelly Coss, MD  gabapentin (NEURONTIN) 300 MG capsule Take 300 mg by mouth 3 (three) times daily as needed (for pain). 02/05/21   [provider]  glipiZIDE (GLUCOTROL XL) 2.5 MG 24 hr tablet Take 2.5 mg by mouth daily with breakfast.    [provider]  ipratropium (ATROVENT) 0.03 % nasal spray Place 2 sprays into both nostrils 2 (two) times daily as needed for rhinitis.    [provider]  midodrine (PROAMATINE) 5 MG tablet Take 1 tablet (5 mg total) by mouth 3 (three) times daily with meals. 06/09/22  Shelly Coss, MD  oxyCODONE (OXY IR/ROXICODONE) 5 MG immediate release tablet Take 1 tablet (5 mg total) by mouth every 6 (six) hours as needed for moderate pain. 06/09/22   Shelly Coss, MD  Semaglutide (OZEMPIC, 0.25 OR 0.5 MG/DOSE, [place]) Inject 0.5 mg into the skin every Wednesday.    [provider]  simvastatin (ZOCOR) 20 MG tablet Take 1 tablet (20 mg total) by mouth every evening. Do not take this medication until you finish Diflucan. Patient taking differently: Take 20 mg by mouth at bedtime. 05/02/22   Samuella Cota, MD  XIGDUO XR 10-500 MG TB24 Take 1 tablet  by mouth daily before breakfast.    [provider]    Family History History reviewed. No pertinent family history.  Social History Social History   Tobacco Use   Smoking status: Former    Packs/day: 1.00    Years: 35.00    Additional pack years: 0.00    Total pack years: 35.00    Types: Cigarettes    Quit date: 2012    Years since quitting: 12.2   Smokeless tobacco: Never  Substance Use Topics   Drug use: Not Currently     Allergies   Patient has no known allergies.   Review of Systems Review of Systems Per HPI  Physical Exam Triage Vital Signs ED Triage Vitals  Enc Vitals Group     BP 06/23/22 1721 (!) 159/83     Pulse Rate 06/23/22 1721 74     Resp 06/23/22 1721 19     Temp 06/23/22 1721 98 F (36.7 C)     Temp src --      SpO2 06/23/22 1721 98 %     Weight --      Height --      Head Circumference --      Peak Flow --      Pain Score 06/23/22 1718 0     Pain Loc --      Pain Edu? --      Excl. in [place]? --    No data found.  Updated Vital Signs BP (!) 159/83   Pulse 74   Temp 98 F (36.7 C)   Resp 19   SpO2 98%   Visual Acuity Right Eye Distance:   Left Eye Distance:   Bilateral Distance:    Right Eye Near:   Left Eye Near:    Bilateral Near:     Physical Exam Constitutional:      General: He is not in acute distress.    Appearance: Normal appearance. He is not toxic-appearing or diaphoretic.  HENT:     Head: Normocephalic and atraumatic.  Eyes:     Extraocular Movements: Extraocular movements intact.     Conjunctiva/sclera: Conjunctivae normal.  Pulmonary:     Effort: Pulmonary effort is normal.  Genitourinary:    Comments: Chronic foley catheter in place.  Neurological:     General: No focal deficit present.     Mental Status: He is alert and oriented to person, place, and time. Mental status is at baseline.  Psychiatric:        Mood and Affect: Mood normal.        Behavior: Behavior normal.        Thought Content:  Thought content normal.        Judgment: Judgment normal.      UC Treatments / Results  Labs (all labs ordered are listed, but only abnormal results are displayed)  Labs Reviewed  POCT URINALYSIS DIP (MANUAL ENTRY) - Abnormal; Notable for the following components:      Result Value   Color, UA light yellow (*)    Clarity, UA cloudy (*)    Glucose, UA =500 (*)    Blood, UA small (*)    Protein Ur, POC =100 (*)    Leukocytes, UA Trace (*)    All other components within normal limits  POCT FASTING CBG [facility] MANUAL ENTRY - Abnormal; Notable for the following components:   POCT Glucose ([facility]) 176 (*)    All other components within normal limits  URINE CULTURE  CBC  BASIC METABOLIC PANEL    EKG   Radiology No results found.  Procedures Procedures (including critical care time)  Medications Ordered in UC Medications - No data to display  Initial Impression / Assessment and Plan / UC Course  I have reviewed the triage vital signs and the nursing notes.  Pertinent labs & imaging results that were available during my care of the patient were reviewed by me and considered in my medical decision making (see chart for details).     UA is showing a trace amount of leukocytes which is decreased from previous UA at ER visit.  Will send urine culture to confirm prior to starting antibiotic therapy.  Patient does have glucose in urine as well.  Glucose today was 176 and patient reports glucose at home was normal prior to arrival.  Patient is taking diabetic medication as prescribed.  Will obtain BMP and CBC given protein on urine but this is most likely baseline given history of CKD.  Clinical nurse flushed Foley catheter to ensure that no obstruction or blood clot is present causing patient's symptoms.  Advised patient to monitor output and symptoms very closely over the next 24 hours and go to the emergency department if symptoms persist or worsen.  Also advised follow-up with PCP and/or  urology.  Vital signs and patient stable at discharge.  Agree with patient safe discharge.  Patient verbalized understanding and was agreeable with plan. Final Clinical Impressions(s) / UC Diagnoses   Final diagnoses:  Dysuria  Decreased urine output  Chronic indwelling Foley catheter     Discharge Instructions      Catheter has been flushed.  Monitor urine output closely over the next 24 hours and if it does not improve, please go straight to the emergency department.  Urine culture and blood work are pending.  Will call if they are abnormal.     ED Prescriptions   None    PDMP not reviewed this encounter.   Teodora Medici, [place] 06/23/22 [phone]

## 2022-06-23 NOTE — Discharge Instructions (Addendum)
Catheter has been flushed.  Monitor urine output closely over the next 24 hours and if it does not improve, please go straight to the emergency department.  Urine culture and blood work are pending.  Will call if they are abnormal.

## 2022-06-23 NOTE — ED Notes (Signed)
Flushed cath with 20 cc of ns per [facility] nps order.

## 2022-06-24 DIAGNOSIS — S2242XD Multiple fractures of ribs, left side, subsequent encounter for fracture with routine healing: Secondary | ICD-10-CM | POA: Diagnosis not present

## 2022-06-24 DIAGNOSIS — N390 Urinary tract infection, site not specified: Secondary | ICD-10-CM | POA: Diagnosis not present

## 2022-06-24 DIAGNOSIS — N179 Acute kidney failure, unspecified: Secondary | ICD-10-CM | POA: Diagnosis not present

## 2022-06-24 DIAGNOSIS — N138 Other obstructive and reflux uropathy: Secondary | ICD-10-CM | POA: Diagnosis not present

## 2022-06-24 DIAGNOSIS — Z466 Encounter for fitting and adjustment of urinary device: Secondary | ICD-10-CM | POA: Diagnosis not present

## 2022-06-24 DIAGNOSIS — N401 Enlarged prostate with lower urinary tract symptoms: Secondary | ICD-10-CM | POA: Diagnosis not present

## 2022-06-24 LAB — URINE CULTURE: Culture: 50000 — AB

## 2022-06-24 LAB — BASIC METABOLIC PANEL
BUN/Creatinine Ratio: 19 (ref 10–24)
BUN: 19 mg/dL (ref 8–27)
CO2: 23 mmol/L (ref 20–29)
Calcium: 9.5 mg/dL (ref 8.6–10.2)
Chloride: 97 mmol/L (ref 96–106)
Creatinine, Ser: 1 mg/dL (ref 0.76–1.27)
Glucose: 158 mg/dL — ABNORMAL HIGH (ref 70–99)
Potassium: 4.2 mmol/L (ref 3.5–5.2)
Sodium: 139 mmol/L (ref 134–144)
eGFR: 82 mL/min/{1.73_m2} (ref >59)

## 2022-06-24 LAB — CBC
Hematocrit: 41 % (ref 37.5–51.0)
Hemoglobin: 13.4 g/dL (ref 13.0–17.7)
MCH: 28.7 pg (ref 26.6–33.0)
MCHC: 32.7 g/dL (ref 31.5–35.7)
MCV: 88 fL (ref 79–97)
Platelets: 364 10*3/uL (ref 150–450)
RBC: 4.67 x10E6/uL (ref 4.14–5.80)
RDW: 14.5 % (ref 11.6–15.4)
WBC: 8.3 10*3/uL (ref 3.4–10.8)

## 2022-06-25 DIAGNOSIS — N401 Enlarged prostate with lower urinary tract symptoms: Secondary | ICD-10-CM | POA: Diagnosis not present

## 2022-06-25 DIAGNOSIS — Z466 Encounter for fitting and adjustment of urinary device: Secondary | ICD-10-CM | POA: Diagnosis not present

## 2022-06-25 DIAGNOSIS — S2242XD Multiple fractures of ribs, left side, subsequent encounter for fracture with routine healing: Secondary | ICD-10-CM | POA: Diagnosis not present

## 2022-06-25 DIAGNOSIS — N390 Urinary tract infection, site not specified: Secondary | ICD-10-CM | POA: Diagnosis not present

## 2022-06-25 DIAGNOSIS — N138 Other obstructive and reflux uropathy: Secondary | ICD-10-CM | POA: Diagnosis not present

## 2022-06-25 DIAGNOSIS — N179 Acute kidney failure, unspecified: Secondary | ICD-10-CM | POA: Diagnosis not present

## 2022-06-27 DIAGNOSIS — N179 Acute kidney failure, unspecified: Secondary | ICD-10-CM | POA: Diagnosis not present

## 2022-06-27 DIAGNOSIS — S2242XD Multiple fractures of ribs, left side, subsequent encounter for fracture with routine healing: Secondary | ICD-10-CM | POA: Diagnosis not present

## 2022-06-27 DIAGNOSIS — N138 Other obstructive and reflux uropathy: Secondary | ICD-10-CM | POA: Diagnosis not present

## 2022-06-27 DIAGNOSIS — N390 Urinary tract infection, site not specified: Secondary | ICD-10-CM | POA: Diagnosis not present

## 2022-06-27 DIAGNOSIS — N401 Enlarged prostate with lower urinary tract symptoms: Secondary | ICD-10-CM | POA: Diagnosis not present

## 2022-06-27 DIAGNOSIS — Z466 Encounter for fitting and adjustment of urinary device: Secondary | ICD-10-CM | POA: Diagnosis not present

## 2022-06-28 DIAGNOSIS — N401 Enlarged prostate with lower urinary tract symptoms: Secondary | ICD-10-CM | POA: Diagnosis not present

## 2022-06-28 DIAGNOSIS — N179 Acute kidney failure, unspecified: Secondary | ICD-10-CM | POA: Diagnosis not present

## 2022-06-28 DIAGNOSIS — Z466 Encounter for fitting and adjustment of urinary device: Secondary | ICD-10-CM | POA: Diagnosis not present

## 2022-06-28 DIAGNOSIS — N138 Other obstructive and reflux uropathy: Secondary | ICD-10-CM | POA: Diagnosis not present

## 2022-06-28 DIAGNOSIS — N390 Urinary tract infection, site not specified: Secondary | ICD-10-CM | POA: Diagnosis not present

## 2022-06-28 DIAGNOSIS — S2242XD Multiple fractures of ribs, left side, subsequent encounter for fracture with routine healing: Secondary | ICD-10-CM | POA: Diagnosis not present

## 2022-06-29 DIAGNOSIS — N401 Enlarged prostate with lower urinary tract symptoms: Secondary | ICD-10-CM | POA: Diagnosis not present

## 2022-06-29 DIAGNOSIS — N138 Other obstructive and reflux uropathy: Secondary | ICD-10-CM | POA: Diagnosis not present

## 2022-06-29 DIAGNOSIS — S2242XD Multiple fractures of ribs, left side, subsequent encounter for fracture with routine healing: Secondary | ICD-10-CM | POA: Diagnosis not present

## 2022-06-29 DIAGNOSIS — N179 Acute kidney failure, unspecified: Secondary | ICD-10-CM | POA: Diagnosis not present

## 2022-06-29 DIAGNOSIS — Z466 Encounter for fitting and adjustment of urinary device: Secondary | ICD-10-CM | POA: Diagnosis not present

## 2022-06-29 DIAGNOSIS — N390 Urinary tract infection, site not specified: Secondary | ICD-10-CM | POA: Diagnosis not present

## 2022-07-02 DIAGNOSIS — S3219XD Other fracture of sacrum, subsequent encounter for fracture with routine healing: Secondary | ICD-10-CM | POA: Diagnosis not present

## 2022-07-02 DIAGNOSIS — I700 Atherosclerosis of aorta: Secondary | ICD-10-CM | POA: Diagnosis not present

## 2022-07-02 DIAGNOSIS — G894 Chronic pain syndrome: Secondary | ICD-10-CM | POA: Diagnosis not present

## 2022-07-02 DIAGNOSIS — N401 Enlarged prostate with lower urinary tract symptoms: Secondary | ICD-10-CM | POA: Diagnosis not present

## 2022-07-02 DIAGNOSIS — Z87891 Personal history of nicotine dependence: Secondary | ICD-10-CM | POA: Diagnosis not present

## 2022-07-02 DIAGNOSIS — I951 Orthostatic hypotension: Secondary | ICD-10-CM | POA: Diagnosis not present

## 2022-07-02 DIAGNOSIS — W1830XD Fall on same level, unspecified, subsequent encounter: Secondary | ICD-10-CM | POA: Diagnosis not present

## 2022-07-02 DIAGNOSIS — Z556 Problems related to health literacy: Secondary | ICD-10-CM | POA: Diagnosis not present

## 2022-07-02 DIAGNOSIS — Z7982 Long term (current) use of aspirin: Secondary | ICD-10-CM | POA: Diagnosis not present

## 2022-07-02 DIAGNOSIS — S2242XD Multiple fractures of ribs, left side, subsequent encounter for fracture with routine healing: Secondary | ICD-10-CM | POA: Diagnosis not present

## 2022-07-02 DIAGNOSIS — Z66 Do not resuscitate: Secondary | ICD-10-CM | POA: Diagnosis not present

## 2022-07-02 DIAGNOSIS — N390 Urinary tract infection, site not specified: Secondary | ICD-10-CM | POA: Diagnosis not present

## 2022-07-02 DIAGNOSIS — Z7984 Long term (current) use of oral hypoglycemic drugs: Secondary | ICD-10-CM | POA: Diagnosis not present

## 2022-07-02 DIAGNOSIS — E785 Hyperlipidemia, unspecified: Secondary | ICD-10-CM | POA: Diagnosis not present

## 2022-07-02 DIAGNOSIS — M5030 Other cervical disc degeneration, unspecified cervical region: Secondary | ICD-10-CM | POA: Diagnosis not present

## 2022-07-02 DIAGNOSIS — H548 Legal blindness, as defined in USA: Secondary | ICD-10-CM | POA: Diagnosis not present

## 2022-07-02 DIAGNOSIS — E11319 Type 2 diabetes mellitus with unspecified diabetic retinopathy without macular edema: Secondary | ICD-10-CM | POA: Diagnosis not present

## 2022-07-02 DIAGNOSIS — Z466 Encounter for fitting and adjustment of urinary device: Secondary | ICD-10-CM | POA: Diagnosis not present

## 2022-07-02 DIAGNOSIS — E1142 Type 2 diabetes mellitus with diabetic polyneuropathy: Secondary | ICD-10-CM | POA: Diagnosis not present

## 2022-07-02 DIAGNOSIS — S22080D Wedge compression fracture of T11-T12 vertebra, subsequent encounter for fracture with routine healing: Secondary | ICD-10-CM | POA: Diagnosis not present

## 2022-07-02 DIAGNOSIS — Z9981 Dependence on supplemental oxygen: Secondary | ICD-10-CM | POA: Diagnosis not present

## 2022-07-02 DIAGNOSIS — I10 Essential (primary) hypertension: Secondary | ICD-10-CM | POA: Diagnosis not present

## 2022-07-02 DIAGNOSIS — Z7985 Long-term (current) use of injectable non-insulin antidiabetic drugs: Secondary | ICD-10-CM | POA: Diagnosis not present

## 2022-07-02 DIAGNOSIS — Z9181 History of falling: Secondary | ICD-10-CM | POA: Diagnosis not present

## 2022-07-02 DIAGNOSIS — N138 Other obstructive and reflux uropathy: Secondary | ICD-10-CM | POA: Diagnosis not present

## 2022-07-05 DIAGNOSIS — Z466 Encounter for fitting and adjustment of urinary device: Secondary | ICD-10-CM | POA: Diagnosis not present

## 2022-07-05 DIAGNOSIS — N138 Other obstructive and reflux uropathy: Secondary | ICD-10-CM | POA: Diagnosis not present

## 2022-07-05 DIAGNOSIS — I951 Orthostatic hypotension: Secondary | ICD-10-CM | POA: Diagnosis not present

## 2022-07-05 DIAGNOSIS — N390 Urinary tract infection, site not specified: Secondary | ICD-10-CM | POA: Diagnosis not present

## 2022-07-05 DIAGNOSIS — W1830XD Fall on same level, unspecified, subsequent encounter: Secondary | ICD-10-CM | POA: Diagnosis not present

## 2022-07-05 DIAGNOSIS — N401 Enlarged prostate with lower urinary tract symptoms: Secondary | ICD-10-CM | POA: Diagnosis not present

## 2022-07-06 DIAGNOSIS — Z466 Encounter for fitting and adjustment of urinary device: Secondary | ICD-10-CM | POA: Diagnosis not present

## 2022-07-06 DIAGNOSIS — N138 Other obstructive and reflux uropathy: Secondary | ICD-10-CM | POA: Diagnosis not present

## 2022-07-06 DIAGNOSIS — I951 Orthostatic hypotension: Secondary | ICD-10-CM | POA: Diagnosis not present

## 2022-07-06 DIAGNOSIS — N401 Enlarged prostate with lower urinary tract symptoms: Secondary | ICD-10-CM | POA: Diagnosis not present

## 2022-07-06 DIAGNOSIS — N390 Urinary tract infection, site not specified: Secondary | ICD-10-CM | POA: Diagnosis not present

## 2022-07-06 DIAGNOSIS — W1830XD Fall on same level, unspecified, subsequent encounter: Secondary | ICD-10-CM | POA: Diagnosis not present

## 2022-07-07 DIAGNOSIS — I951 Orthostatic hypotension: Secondary | ICD-10-CM | POA: Diagnosis not present

## 2022-07-07 DIAGNOSIS — N401 Enlarged prostate with lower urinary tract symptoms: Secondary | ICD-10-CM | POA: Diagnosis not present

## 2022-07-07 DIAGNOSIS — N138 Other obstructive and reflux uropathy: Secondary | ICD-10-CM | POA: Diagnosis not present

## 2022-07-07 DIAGNOSIS — W1830XD Fall on same level, unspecified, subsequent encounter: Secondary | ICD-10-CM | POA: Diagnosis not present

## 2022-07-07 DIAGNOSIS — N390 Urinary tract infection, site not specified: Secondary | ICD-10-CM | POA: Diagnosis not present

## 2022-07-07 DIAGNOSIS — Z466 Encounter for fitting and adjustment of urinary device: Secondary | ICD-10-CM | POA: Diagnosis not present

## 2022-07-11 DIAGNOSIS — I951 Orthostatic hypotension: Secondary | ICD-10-CM | POA: Diagnosis not present

## 2022-07-11 DIAGNOSIS — N401 Enlarged prostate with lower urinary tract symptoms: Secondary | ICD-10-CM | POA: Diagnosis not present

## 2022-07-11 DIAGNOSIS — Z466 Encounter for fitting and adjustment of urinary device: Secondary | ICD-10-CM | POA: Diagnosis not present

## 2022-07-11 DIAGNOSIS — N390 Urinary tract infection, site not specified: Secondary | ICD-10-CM | POA: Diagnosis not present

## 2022-07-11 DIAGNOSIS — N138 Other obstructive and reflux uropathy: Secondary | ICD-10-CM | POA: Diagnosis not present

## 2022-07-11 DIAGNOSIS — W1830XD Fall on same level, unspecified, subsequent encounter: Secondary | ICD-10-CM | POA: Diagnosis not present

## 2022-07-12 DIAGNOSIS — N401 Enlarged prostate with lower urinary tract symptoms: Secondary | ICD-10-CM | POA: Diagnosis not present

## 2022-07-12 DIAGNOSIS — N390 Urinary tract infection, site not specified: Secondary | ICD-10-CM | POA: Diagnosis not present

## 2022-07-12 DIAGNOSIS — N138 Other obstructive and reflux uropathy: Secondary | ICD-10-CM | POA: Diagnosis not present

## 2022-07-12 DIAGNOSIS — I951 Orthostatic hypotension: Secondary | ICD-10-CM | POA: Diagnosis not present

## 2022-07-12 DIAGNOSIS — Z466 Encounter for fitting and adjustment of urinary device: Secondary | ICD-10-CM | POA: Diagnosis not present

## 2022-07-12 DIAGNOSIS — W1830XD Fall on same level, unspecified, subsequent encounter: Secondary | ICD-10-CM | POA: Diagnosis not present

## 2022-07-13 DIAGNOSIS — I951 Orthostatic hypotension: Secondary | ICD-10-CM | POA: Diagnosis not present

## 2022-07-13 DIAGNOSIS — N401 Enlarged prostate with lower urinary tract symptoms: Secondary | ICD-10-CM | POA: Diagnosis not present

## 2022-07-13 DIAGNOSIS — Z466 Encounter for fitting and adjustment of urinary device: Secondary | ICD-10-CM | POA: Diagnosis not present

## 2022-07-13 DIAGNOSIS — N138 Other obstructive and reflux uropathy: Secondary | ICD-10-CM | POA: Diagnosis not present

## 2022-07-13 DIAGNOSIS — W1830XD Fall on same level, unspecified, subsequent encounter: Secondary | ICD-10-CM | POA: Diagnosis not present

## 2022-07-13 DIAGNOSIS — N390 Urinary tract infection, site not specified: Secondary | ICD-10-CM | POA: Diagnosis not present

## 2022-07-19 DIAGNOSIS — I951 Orthostatic hypotension: Secondary | ICD-10-CM | POA: Diagnosis not present

## 2022-07-19 DIAGNOSIS — Z466 Encounter for fitting and adjustment of urinary device: Secondary | ICD-10-CM | POA: Diagnosis not present

## 2022-07-19 DIAGNOSIS — N401 Enlarged prostate with lower urinary tract symptoms: Secondary | ICD-10-CM | POA: Diagnosis not present

## 2022-07-19 DIAGNOSIS — N390 Urinary tract infection, site not specified: Secondary | ICD-10-CM | POA: Diagnosis not present

## 2022-07-19 DIAGNOSIS — W1830XD Fall on same level, unspecified, subsequent encounter: Secondary | ICD-10-CM | POA: Diagnosis not present

## 2022-07-19 DIAGNOSIS — N138 Other obstructive and reflux uropathy: Secondary | ICD-10-CM | POA: Diagnosis not present

## 2022-07-20 DIAGNOSIS — N401 Enlarged prostate with lower urinary tract symptoms: Secondary | ICD-10-CM | POA: Diagnosis not present

## 2022-07-20 DIAGNOSIS — Z466 Encounter for fitting and adjustment of urinary device: Secondary | ICD-10-CM | POA: Diagnosis not present

## 2022-07-20 DIAGNOSIS — I951 Orthostatic hypotension: Secondary | ICD-10-CM | POA: Diagnosis not present

## 2022-07-20 DIAGNOSIS — W1830XD Fall on same level, unspecified, subsequent encounter: Secondary | ICD-10-CM | POA: Diagnosis not present

## 2022-07-20 DIAGNOSIS — N390 Urinary tract infection, site not specified: Secondary | ICD-10-CM | POA: Diagnosis not present

## 2022-07-20 DIAGNOSIS — N138 Other obstructive and reflux uropathy: Secondary | ICD-10-CM | POA: Diagnosis not present

## 2022-07-21 DIAGNOSIS — I951 Orthostatic hypotension: Secondary | ICD-10-CM | POA: Diagnosis not present

## 2022-07-21 DIAGNOSIS — N390 Urinary tract infection, site not specified: Secondary | ICD-10-CM | POA: Diagnosis not present

## 2022-07-21 DIAGNOSIS — N138 Other obstructive and reflux uropathy: Secondary | ICD-10-CM | POA: Diagnosis not present

## 2022-07-21 DIAGNOSIS — Z466 Encounter for fitting and adjustment of urinary device: Secondary | ICD-10-CM | POA: Diagnosis not present

## 2022-07-21 DIAGNOSIS — W1830XD Fall on same level, unspecified, subsequent encounter: Secondary | ICD-10-CM | POA: Diagnosis not present

## 2022-07-21 DIAGNOSIS — N401 Enlarged prostate with lower urinary tract symptoms: Secondary | ICD-10-CM | POA: Diagnosis not present

## 2022-07-26 DIAGNOSIS — N390 Urinary tract infection, site not specified: Secondary | ICD-10-CM | POA: Diagnosis not present

## 2022-07-26 DIAGNOSIS — I951 Orthostatic hypotension: Secondary | ICD-10-CM | POA: Diagnosis not present

## 2022-07-26 DIAGNOSIS — W1830XD Fall on same level, unspecified, subsequent encounter: Secondary | ICD-10-CM | POA: Diagnosis not present

## 2022-07-26 DIAGNOSIS — N138 Other obstructive and reflux uropathy: Secondary | ICD-10-CM | POA: Diagnosis not present

## 2022-07-26 DIAGNOSIS — Z466 Encounter for fitting and adjustment of urinary device: Secondary | ICD-10-CM | POA: Diagnosis not present

## 2022-07-26 DIAGNOSIS — N401 Enlarged prostate with lower urinary tract symptoms: Secondary | ICD-10-CM | POA: Diagnosis not present

## 2022-07-27 DIAGNOSIS — N138 Other obstructive and reflux uropathy: Secondary | ICD-10-CM | POA: Diagnosis not present

## 2022-07-27 DIAGNOSIS — N401 Enlarged prostate with lower urinary tract symptoms: Secondary | ICD-10-CM | POA: Diagnosis not present

## 2022-07-27 DIAGNOSIS — N390 Urinary tract infection, site not specified: Secondary | ICD-10-CM | POA: Diagnosis not present

## 2022-07-27 DIAGNOSIS — W1830XD Fall on same level, unspecified, subsequent encounter: Secondary | ICD-10-CM | POA: Diagnosis not present

## 2022-07-27 DIAGNOSIS — Z466 Encounter for fitting and adjustment of urinary device: Secondary | ICD-10-CM | POA: Diagnosis not present

## 2022-07-27 DIAGNOSIS — I951 Orthostatic hypotension: Secondary | ICD-10-CM | POA: Diagnosis not present

## 2022-08-01 DIAGNOSIS — I951 Orthostatic hypotension: Secondary | ICD-10-CM | POA: Diagnosis not present

## 2022-08-01 DIAGNOSIS — Z9981 Dependence on supplemental oxygen: Secondary | ICD-10-CM | POA: Diagnosis not present

## 2022-08-01 DIAGNOSIS — N138 Other obstructive and reflux uropathy: Secondary | ICD-10-CM | POA: Diagnosis not present

## 2022-08-01 DIAGNOSIS — I700 Atherosclerosis of aorta: Secondary | ICD-10-CM | POA: Diagnosis not present

## 2022-08-01 DIAGNOSIS — M5030 Other cervical disc degeneration, unspecified cervical region: Secondary | ICD-10-CM | POA: Diagnosis not present

## 2022-08-01 DIAGNOSIS — S22080D Wedge compression fracture of T11-T12 vertebra, subsequent encounter for fracture with routine healing: Secondary | ICD-10-CM | POA: Diagnosis not present

## 2022-08-01 DIAGNOSIS — Z7982 Long term (current) use of aspirin: Secondary | ICD-10-CM | POA: Diagnosis not present

## 2022-08-01 DIAGNOSIS — W1830XD Fall on same level, unspecified, subsequent encounter: Secondary | ICD-10-CM | POA: Diagnosis not present

## 2022-08-01 DIAGNOSIS — S2242XD Multiple fractures of ribs, left side, subsequent encounter for fracture with routine healing: Secondary | ICD-10-CM | POA: Diagnosis not present

## 2022-08-01 DIAGNOSIS — I10 Essential (primary) hypertension: Secondary | ICD-10-CM | POA: Diagnosis not present

## 2022-08-01 DIAGNOSIS — Z7985 Long-term (current) use of injectable non-insulin antidiabetic drugs: Secondary | ICD-10-CM | POA: Diagnosis not present

## 2022-08-01 DIAGNOSIS — Z66 Do not resuscitate: Secondary | ICD-10-CM | POA: Diagnosis not present

## 2022-08-01 DIAGNOSIS — E11319 Type 2 diabetes mellitus with unspecified diabetic retinopathy without macular edema: Secondary | ICD-10-CM | POA: Diagnosis not present

## 2022-08-01 DIAGNOSIS — Z87891 Personal history of nicotine dependence: Secondary | ICD-10-CM | POA: Diagnosis not present

## 2022-08-01 DIAGNOSIS — Z7984 Long term (current) use of oral hypoglycemic drugs: Secondary | ICD-10-CM | POA: Diagnosis not present

## 2022-08-01 DIAGNOSIS — Z9181 History of falling: Secondary | ICD-10-CM | POA: Diagnosis not present

## 2022-08-01 DIAGNOSIS — N390 Urinary tract infection, site not specified: Secondary | ICD-10-CM | POA: Diagnosis not present

## 2022-08-01 DIAGNOSIS — H548 Legal blindness, as defined in USA: Secondary | ICD-10-CM | POA: Diagnosis not present

## 2022-08-01 DIAGNOSIS — G894 Chronic pain syndrome: Secondary | ICD-10-CM | POA: Diagnosis not present

## 2022-08-01 DIAGNOSIS — S3219XD Other fracture of sacrum, subsequent encounter for fracture with routine healing: Secondary | ICD-10-CM | POA: Diagnosis not present

## 2022-08-01 DIAGNOSIS — E785 Hyperlipidemia, unspecified: Secondary | ICD-10-CM | POA: Diagnosis not present

## 2022-08-01 DIAGNOSIS — N401 Enlarged prostate with lower urinary tract symptoms: Secondary | ICD-10-CM | POA: Diagnosis not present

## 2022-08-01 DIAGNOSIS — Z556 Problems related to health literacy: Secondary | ICD-10-CM | POA: Diagnosis not present

## 2022-08-01 DIAGNOSIS — Z466 Encounter for fitting and adjustment of urinary device: Secondary | ICD-10-CM | POA: Diagnosis not present

## 2022-08-01 DIAGNOSIS — E1142 Type 2 diabetes mellitus with diabetic polyneuropathy: Secondary | ICD-10-CM | POA: Diagnosis not present

## 2022-08-10 DIAGNOSIS — N401 Enlarged prostate with lower urinary tract symptoms: Secondary | ICD-10-CM | POA: Diagnosis not present

## 2022-08-10 DIAGNOSIS — N138 Other obstructive and reflux uropathy: Secondary | ICD-10-CM | POA: Diagnosis not present

## 2022-08-10 DIAGNOSIS — W1830XD Fall on same level, unspecified, subsequent encounter: Secondary | ICD-10-CM | POA: Diagnosis not present

## 2022-08-10 DIAGNOSIS — N390 Urinary tract infection, site not specified: Secondary | ICD-10-CM | POA: Diagnosis not present

## 2022-08-10 DIAGNOSIS — I951 Orthostatic hypotension: Secondary | ICD-10-CM | POA: Diagnosis not present

## 2022-08-10 DIAGNOSIS — Z466 Encounter for fitting and adjustment of urinary device: Secondary | ICD-10-CM | POA: Diagnosis not present

## 2022-08-11 DIAGNOSIS — N138 Other obstructive and reflux uropathy: Secondary | ICD-10-CM | POA: Diagnosis not present

## 2022-08-11 DIAGNOSIS — W1830XD Fall on same level, unspecified, subsequent encounter: Secondary | ICD-10-CM | POA: Diagnosis not present

## 2022-08-11 DIAGNOSIS — Z466 Encounter for fitting and adjustment of urinary device: Secondary | ICD-10-CM | POA: Diagnosis not present

## 2022-08-11 DIAGNOSIS — I951 Orthostatic hypotension: Secondary | ICD-10-CM | POA: Diagnosis not present

## 2022-08-11 DIAGNOSIS — N401 Enlarged prostate with lower urinary tract symptoms: Secondary | ICD-10-CM | POA: Diagnosis not present

## 2022-08-11 DIAGNOSIS — N390 Urinary tract infection, site not specified: Secondary | ICD-10-CM | POA: Diagnosis not present

## 2022-08-12 DIAGNOSIS — T23209A Burn of second degree of unspecified hand, unspecified site, initial encounter: Secondary | ICD-10-CM | POA: Diagnosis not present

## 2022-08-12 DIAGNOSIS — E1122 Type 2 diabetes mellitus with diabetic chronic kidney disease: Secondary | ICD-10-CM | POA: Diagnosis not present

## 2022-08-16 DIAGNOSIS — T300 Burn of unspecified body region, unspecified degree: Secondary | ICD-10-CM | POA: Diagnosis not present

## 2022-08-17 DIAGNOSIS — W1830XD Fall on same level, unspecified, subsequent encounter: Secondary | ICD-10-CM | POA: Diagnosis not present

## 2022-08-17 DIAGNOSIS — N390 Urinary tract infection, site not specified: Secondary | ICD-10-CM | POA: Diagnosis not present

## 2022-08-17 DIAGNOSIS — Z466 Encounter for fitting and adjustment of urinary device: Secondary | ICD-10-CM | POA: Diagnosis not present

## 2022-08-17 DIAGNOSIS — N138 Other obstructive and reflux uropathy: Secondary | ICD-10-CM | POA: Diagnosis not present

## 2022-08-17 DIAGNOSIS — I951 Orthostatic hypotension: Secondary | ICD-10-CM | POA: Diagnosis not present

## 2022-08-17 DIAGNOSIS — N401 Enlarged prostate with lower urinary tract symptoms: Secondary | ICD-10-CM | POA: Diagnosis not present

## 2022-08-19 DIAGNOSIS — M5451 Vertebrogenic low back pain: Secondary | ICD-10-CM | POA: Diagnosis not present

## 2022-08-19 DIAGNOSIS — M25551 Pain in right hip: Secondary | ICD-10-CM | POA: Diagnosis not present

## 2022-08-19 DIAGNOSIS — S22080A Wedge compression fracture of T11-T12 vertebra, initial encounter for closed fracture: Secondary | ICD-10-CM | POA: Diagnosis not present

## 2022-08-23 DIAGNOSIS — Z466 Encounter for fitting and adjustment of urinary device: Secondary | ICD-10-CM | POA: Diagnosis not present

## 2022-08-23 DIAGNOSIS — I951 Orthostatic hypotension: Secondary | ICD-10-CM | POA: Diagnosis not present

## 2022-08-23 DIAGNOSIS — N138 Other obstructive and reflux uropathy: Secondary | ICD-10-CM | POA: Diagnosis not present

## 2022-08-23 DIAGNOSIS — N390 Urinary tract infection, site not specified: Secondary | ICD-10-CM | POA: Diagnosis not present

## 2022-08-23 DIAGNOSIS — N401 Enlarged prostate with lower urinary tract symptoms: Secondary | ICD-10-CM | POA: Diagnosis not present

## 2022-08-23 DIAGNOSIS — W1830XD Fall on same level, unspecified, subsequent encounter: Secondary | ICD-10-CM | POA: Diagnosis not present

## 2022-08-25 DIAGNOSIS — W1830XD Fall on same level, unspecified, subsequent encounter: Secondary | ICD-10-CM | POA: Diagnosis not present

## 2022-08-25 DIAGNOSIS — N390 Urinary tract infection, site not specified: Secondary | ICD-10-CM | POA: Diagnosis not present

## 2022-08-25 DIAGNOSIS — N138 Other obstructive and reflux uropathy: Secondary | ICD-10-CM | POA: Diagnosis not present

## 2022-08-25 DIAGNOSIS — N401 Enlarged prostate with lower urinary tract symptoms: Secondary | ICD-10-CM | POA: Diagnosis not present

## 2022-08-25 DIAGNOSIS — Z466 Encounter for fitting and adjustment of urinary device: Secondary | ICD-10-CM | POA: Diagnosis not present

## 2022-08-25 DIAGNOSIS — I951 Orthostatic hypotension: Secondary | ICD-10-CM | POA: Diagnosis not present

## 2022-08-31 IMAGING — US US RENAL
1 series · 14 of 25 positions shown · non-contrast
Comparison: None.

CLINICAL DATA: Acute renal failure

EXAM:
RENAL / URINARY TRACT ULTRASOUND COMPLETE

[Series 1: us renal · 14 of 77 slices shown]
[im 1/77]
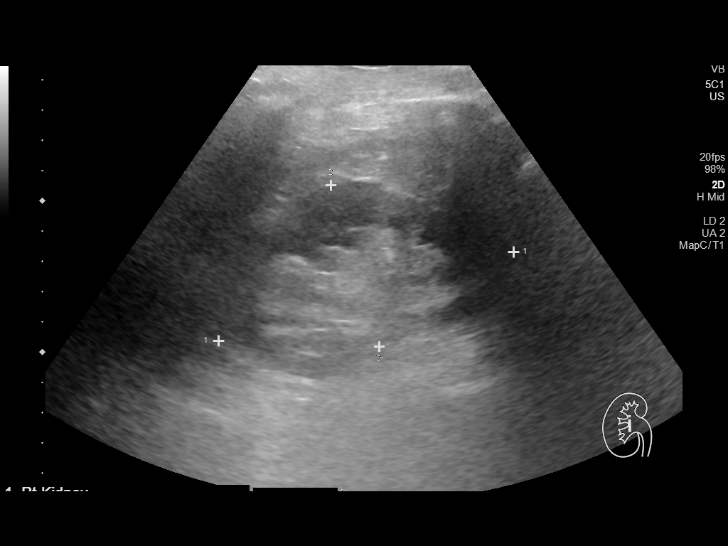
[im 7/77]
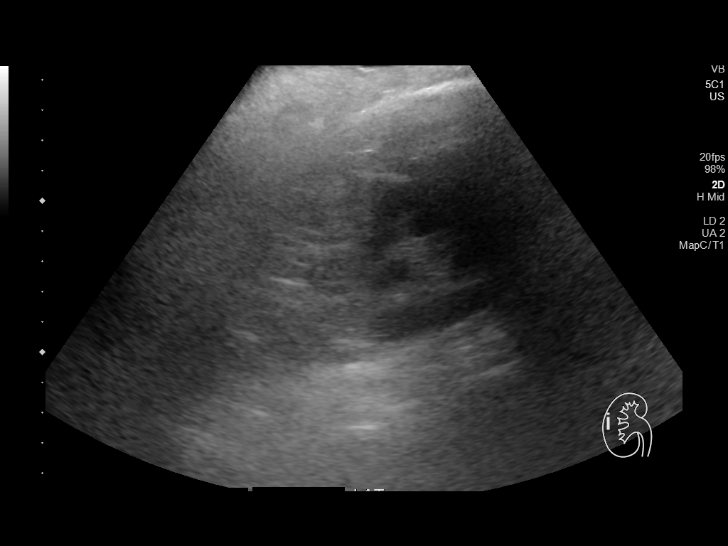
[im 13/77]
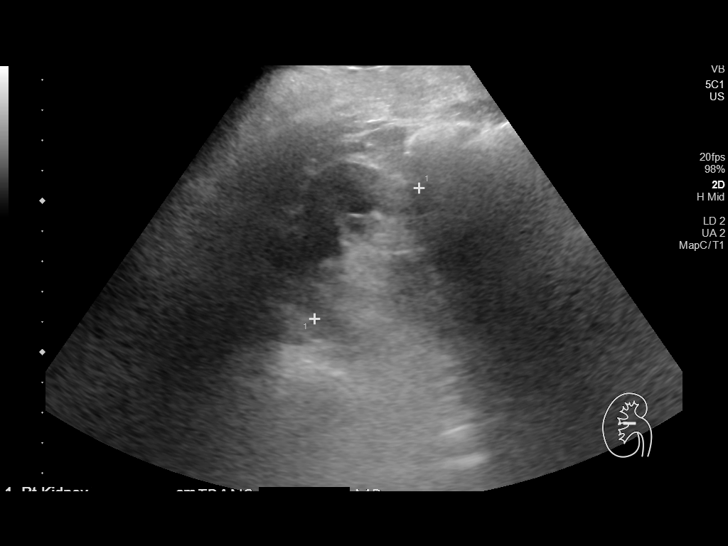
[im 20/77]
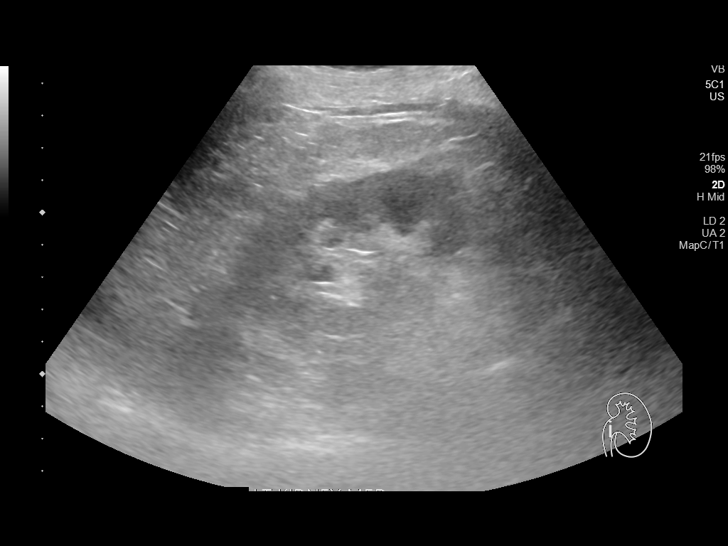
[im 26/77]
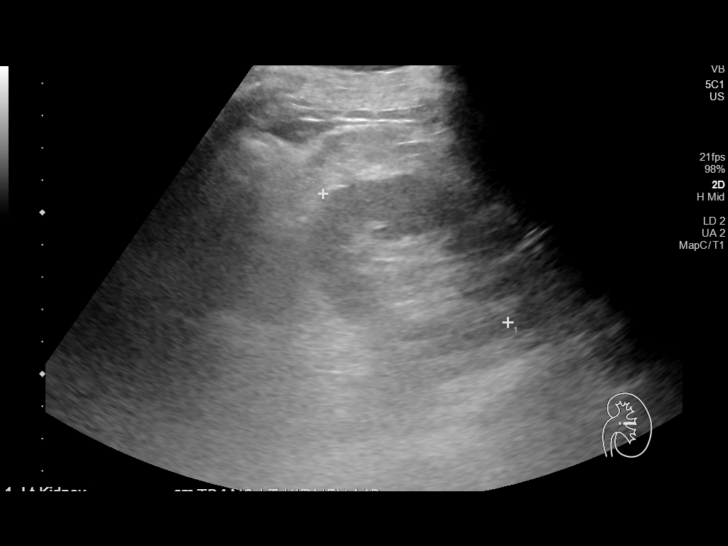
[im 29/77]
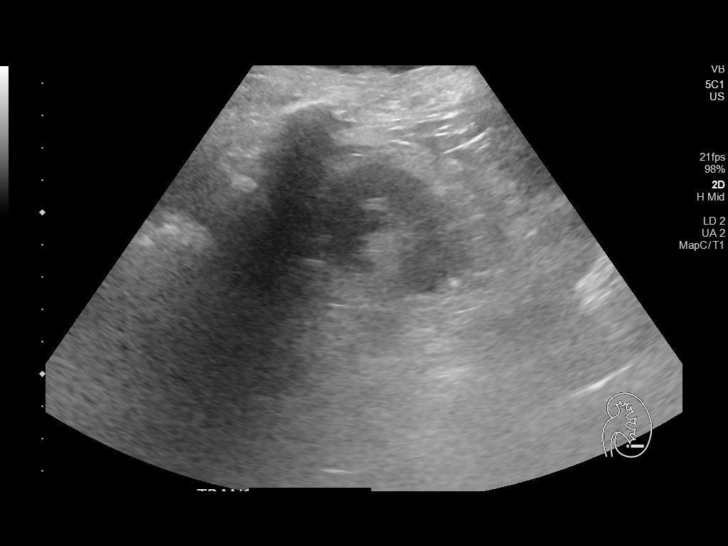
[im 35/77]
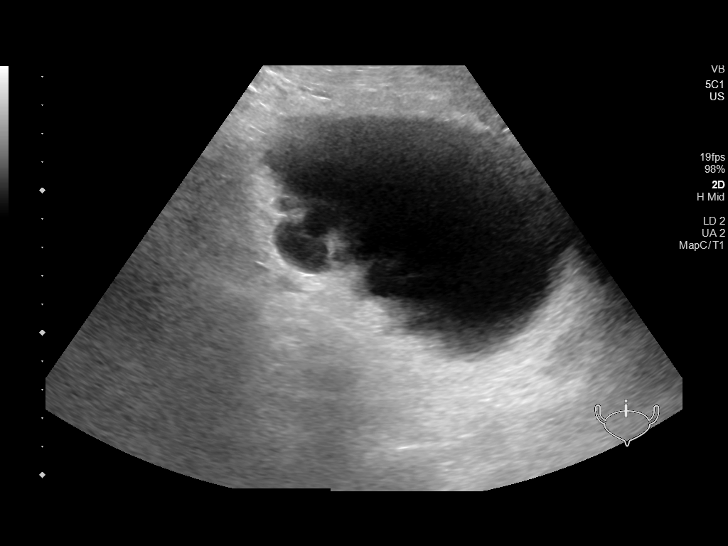
[im 42/77]
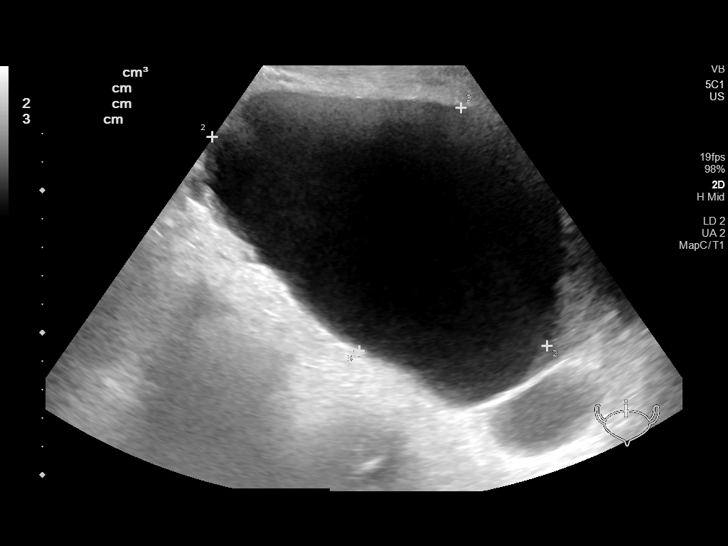
[im 48/77]
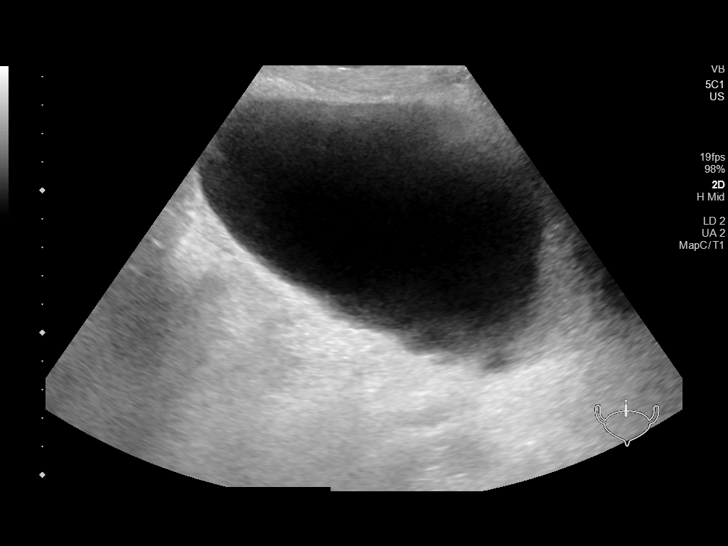
[im 51/77]
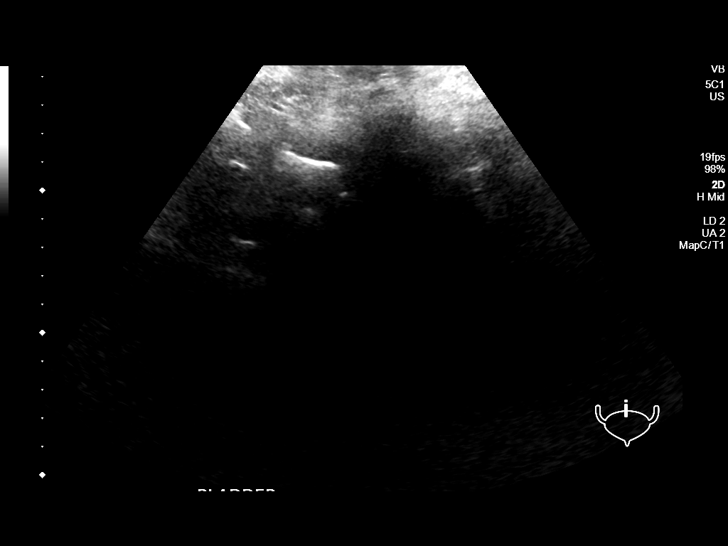
[im 58/77]
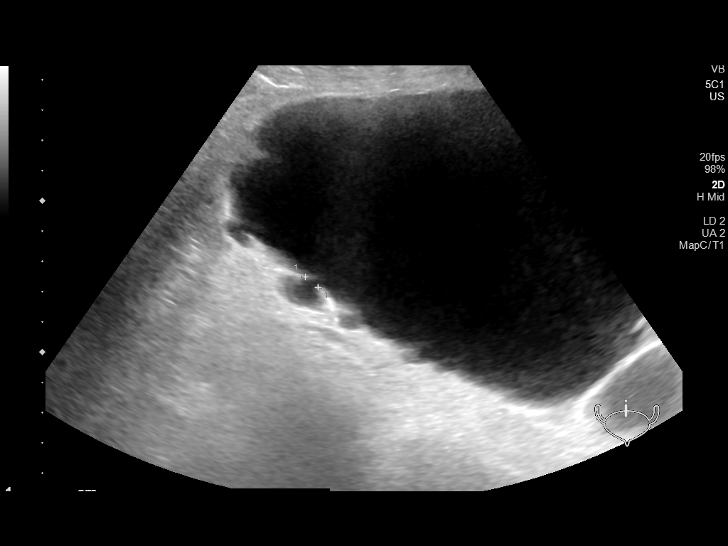
[im 64/77]
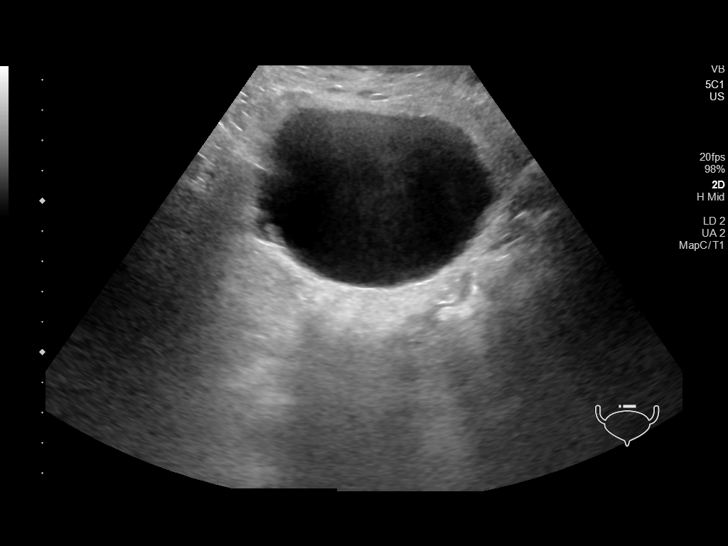
[im 70/77]
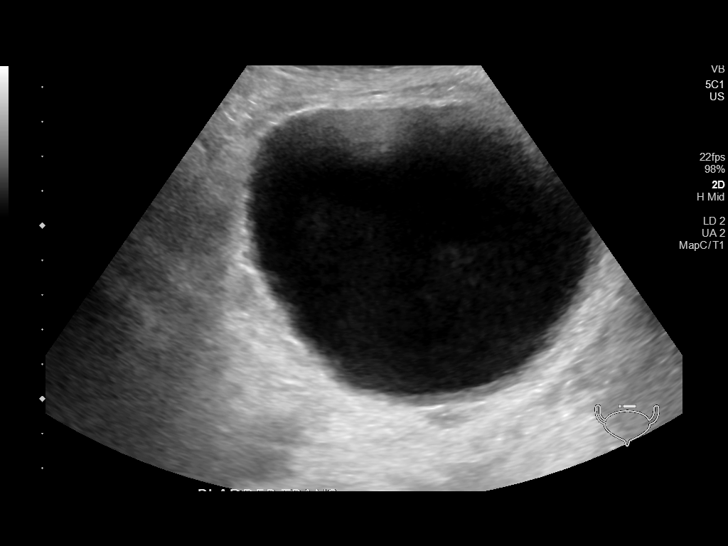
[im 77/77]
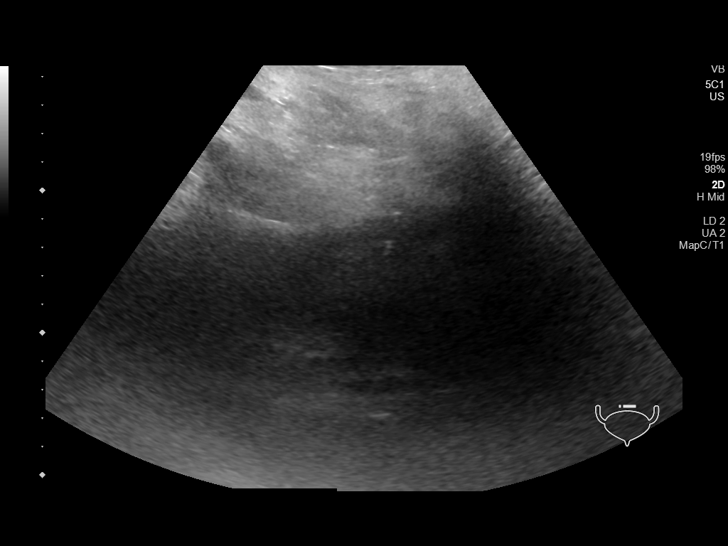

[14 of 25 positions shown; findings below may reference images not displayed]

FINDINGS: Right Kidney:

Renal measurements: 10.2 x 5.6 x 5.5 cm. = volume: 164 mL.
Echogenicity within normal limits. No mass or hydronephrosis
visualized.

Left Kidney:

Renal measurements: 11.0 x 5.6 x 7.0 cm. = volume: 25 mL.
Echogenicity within normal limits. No mass or hydronephrosis
visualized.

Bladder:

Bladder is well distended. Multiple small bladder diverticula are
noted.

Other:

None.
IMPRESSION: No acute renal abnormality.

Multiple small bladder diverticula.

## 2022-08-31 IMAGING — DX DG CHEST 1V PORT
1 series · 1 of 1 positions shown · non-contrast
Comparison: Chest radiograph 2 days ago 02/09/2021

CLINICAL DATA: Increasing shortness of breath.

EXAM:
PORTABLE CHEST 1 VIEW

[chest ap]
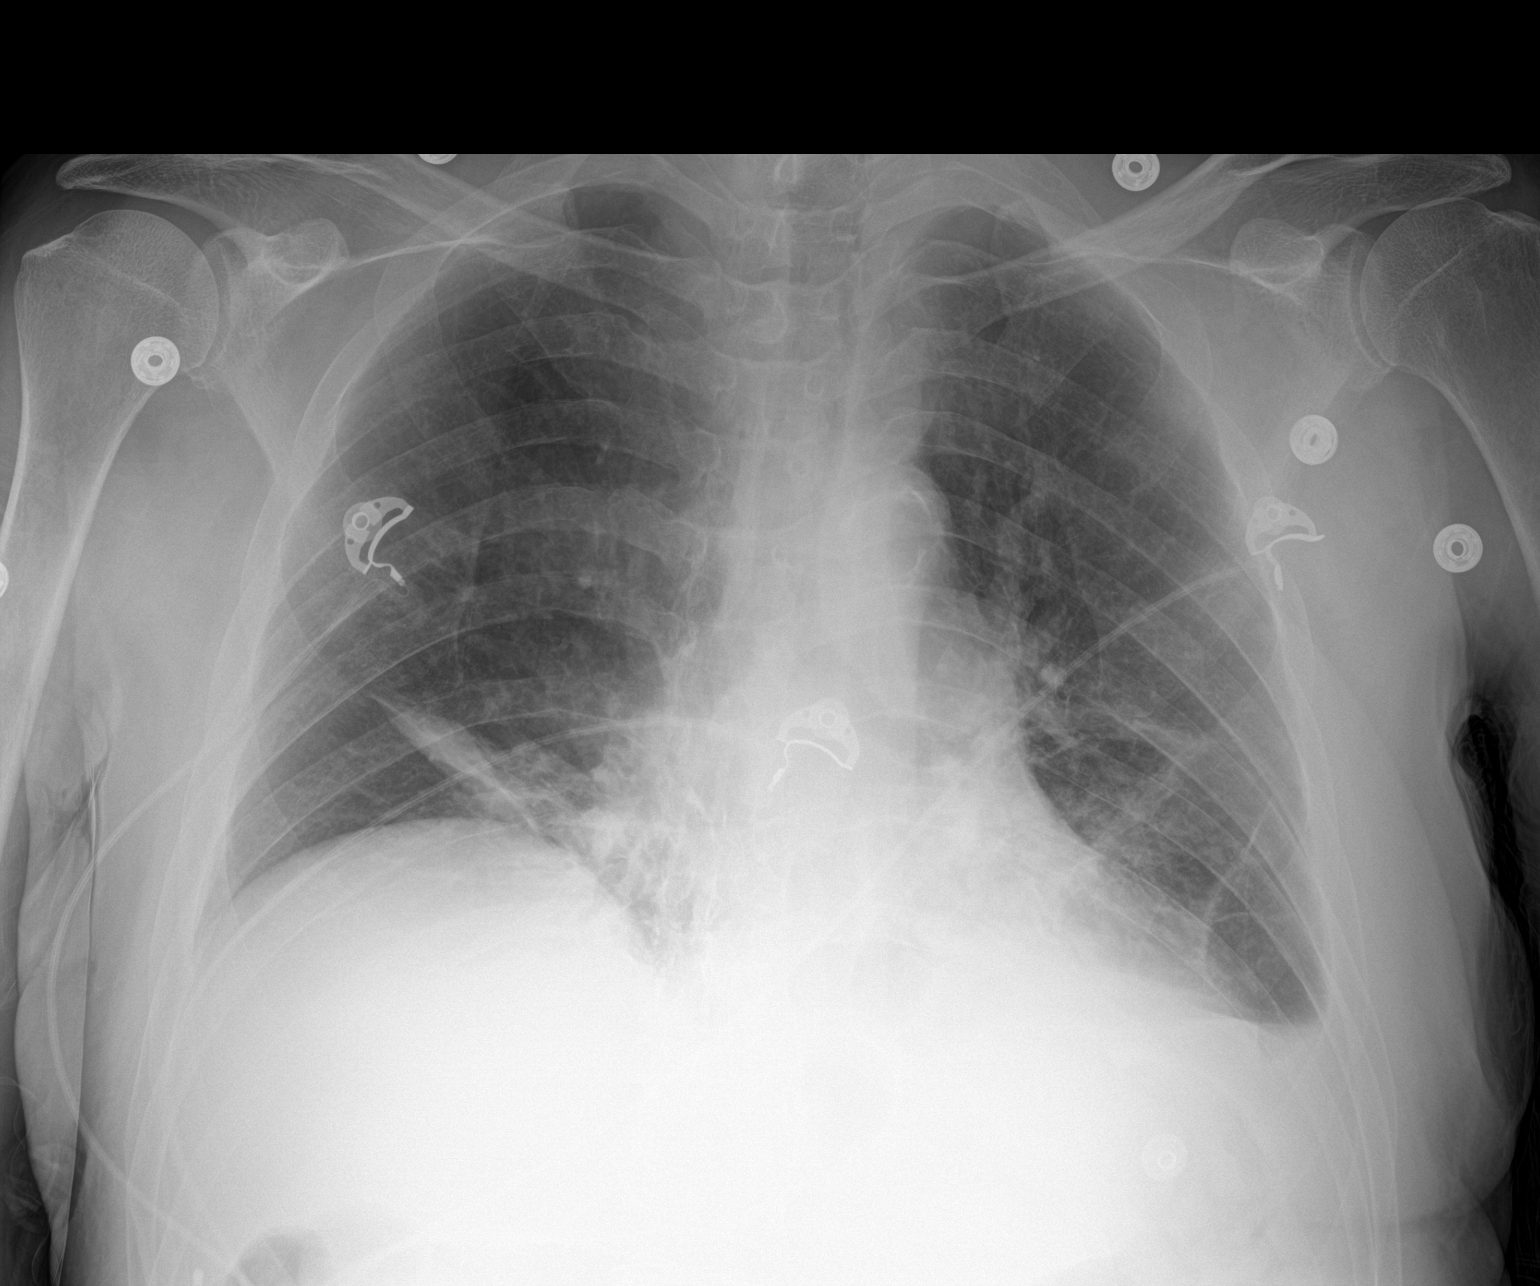

[1 of 1 positions shown; findings below may reference images not displayed]

FINDINGS: Lower lung volumes from prior exam. Stable heart size and
mediastinal contours. Developing left pleural effusion with
increasing streaky left lung base opacity. New bandlike opacity at
the right lung base. Vascular congestion. No pneumothorax. Stable
osseous structures.
IMPRESSION: 1. Developing left pleural effusion with increasing streaky left
lung base opacity, atelectasis versus pneumonia.
2. New bandlike opacity at the right lung base, typical of
atelectasis.

## 2022-09-01 DIAGNOSIS — R3982 Chronic bladder pain: Secondary | ICD-10-CM | POA: Diagnosis not present

## 2022-09-01 DIAGNOSIS — R338 Other retention of urine: Secondary | ICD-10-CM | POA: Diagnosis not present

## 2022-09-06 DIAGNOSIS — S22080A Wedge compression fracture of T11-T12 vertebra, initial encounter for closed fracture: Secondary | ICD-10-CM | POA: Diagnosis not present

## 2022-09-06 DIAGNOSIS — M25551 Pain in right hip: Secondary | ICD-10-CM | POA: Diagnosis not present

## 2022-09-06 DIAGNOSIS — M546 Pain in thoracic spine: Secondary | ICD-10-CM | POA: Diagnosis not present

## 2022-09-09 DIAGNOSIS — S22080A Wedge compression fracture of T11-T12 vertebra, initial encounter for closed fracture: Secondary | ICD-10-CM | POA: Diagnosis not present

## 2022-09-09 DIAGNOSIS — M25551 Pain in right hip: Secondary | ICD-10-CM | POA: Diagnosis not present

## 2022-09-26 DIAGNOSIS — M7061 Trochanteric bursitis, right hip: Secondary | ICD-10-CM | POA: Diagnosis not present

## 2022-09-26 DIAGNOSIS — S22000A Wedge compression fracture of unspecified thoracic vertebra, initial encounter for closed fracture: Secondary | ICD-10-CM | POA: Diagnosis not present

## 2022-11-21 DIAGNOSIS — R338 Other retention of urine: Secondary | ICD-10-CM | POA: Diagnosis not present

## 2022-11-21 DIAGNOSIS — R109 Unspecified abdominal pain: Secondary | ICD-10-CM | POA: Diagnosis not present

## 2022-12-02 DIAGNOSIS — Z23 Encounter for immunization: Secondary | ICD-10-CM | POA: Diagnosis not present

## 2022-12-02 DIAGNOSIS — E1122 Type 2 diabetes mellitus with diabetic chronic kidney disease: Secondary | ICD-10-CM | POA: Diagnosis not present

## 2022-12-02 DIAGNOSIS — I129 Hypertensive chronic kidney disease with stage 1 through stage 4 chronic kidney disease, or unspecified chronic kidney disease: Secondary | ICD-10-CM | POA: Diagnosis not present

## 2022-12-02 DIAGNOSIS — E1140 Type 2 diabetes mellitus with diabetic neuropathy, unspecified: Secondary | ICD-10-CM | POA: Diagnosis not present

## 2022-12-02 DIAGNOSIS — H548 Legal blindness, as defined in USA: Secondary | ICD-10-CM | POA: Diagnosis not present

## 2022-12-02 DIAGNOSIS — E785 Hyperlipidemia, unspecified: Secondary | ICD-10-CM | POA: Diagnosis not present

## 2022-12-09 DIAGNOSIS — R338 Other retention of urine: Secondary | ICD-10-CM | POA: Diagnosis not present

## 2022-12-09 DIAGNOSIS — N3942 Incontinence without sensory awareness: Secondary | ICD-10-CM | POA: Diagnosis not present

## 2022-12-09 DIAGNOSIS — N401 Enlarged prostate with lower urinary tract symptoms: Secondary | ICD-10-CM | POA: Diagnosis not present

## 2023-01-24 DIAGNOSIS — R42 Dizziness and giddiness: Secondary | ICD-10-CM | POA: Diagnosis not present

## 2023-01-26 DIAGNOSIS — R350 Frequency of micturition: Secondary | ICD-10-CM | POA: Diagnosis not present

## 2023-02-02 DIAGNOSIS — E1122 Type 2 diabetes mellitus with diabetic chronic kidney disease: Secondary | ICD-10-CM | POA: Diagnosis not present

## 2023-02-02 DIAGNOSIS — R42 Dizziness and giddiness: Secondary | ICD-10-CM | POA: Diagnosis not present

## 2023-02-02 DIAGNOSIS — I129 Hypertensive chronic kidney disease with stage 1 through stage 4 chronic kidney disease, or unspecified chronic kidney disease: Secondary | ICD-10-CM | POA: Diagnosis not present

## 2023-02-15 DIAGNOSIS — R109 Unspecified abdominal pain: Secondary | ICD-10-CM | POA: Diagnosis not present

## 2023-02-15 DIAGNOSIS — R338 Other retention of urine: Secondary | ICD-10-CM | POA: Diagnosis not present

## 2023-04-04 DIAGNOSIS — E1122 Type 2 diabetes mellitus with diabetic chronic kidney disease: Secondary | ICD-10-CM | POA: Diagnosis not present

## 2023-04-11 DIAGNOSIS — N3000 Acute cystitis without hematuria: Secondary | ICD-10-CM | POA: Diagnosis not present

## 2023-04-11 DIAGNOSIS — I129 Hypertensive chronic kidney disease with stage 1 through stage 4 chronic kidney disease, or unspecified chronic kidney disease: Secondary | ICD-10-CM | POA: Diagnosis not present

## 2023-04-11 DIAGNOSIS — R2689 Other abnormalities of gait and mobility: Secondary | ICD-10-CM | POA: Diagnosis not present

## 2023-04-11 DIAGNOSIS — E1122 Type 2 diabetes mellitus with diabetic chronic kidney disease: Secondary | ICD-10-CM | POA: Diagnosis not present

## 2023-04-11 DIAGNOSIS — H548 Legal blindness, as defined in USA: Secondary | ICD-10-CM | POA: Diagnosis not present

## 2023-04-11 DIAGNOSIS — E1140 Type 2 diabetes mellitus with diabetic neuropathy, unspecified: Secondary | ICD-10-CM | POA: Diagnosis not present

## 2023-04-12 ENCOUNTER — Other Ambulatory Visit: Payer: Self-pay

## 2023-04-12 ENCOUNTER — Inpatient Hospital Stay (HOSPITAL_COMMUNITY)
Admission: EM | Admit: 2023-04-12 | Discharge: 2023-04-15 | DRG: 699 | Disposition: A | Discharge status: Home Health | Payer: Medicare Other | Attending: Internal Medicine | Admitting: Internal Medicine

## 2023-04-12 ENCOUNTER — Encounter (HOSPITAL_COMMUNITY): Payer: Self-pay

## 2023-04-12 DIAGNOSIS — N3000 Acute cystitis without hematuria: Secondary | ICD-10-CM | POA: Diagnosis not present

## 2023-04-12 DIAGNOSIS — H547 Unspecified visual loss: Secondary | ICD-10-CM

## 2023-04-12 DIAGNOSIS — Z87891 Personal history of nicotine dependence: Secondary | ICD-10-CM | POA: Diagnosis not present

## 2023-04-12 DIAGNOSIS — I951 Orthostatic hypotension: Secondary | ICD-10-CM | POA: Diagnosis present

## 2023-04-12 DIAGNOSIS — R000 Tachycardia, unspecified: Secondary | ICD-10-CM | POA: Diagnosis present

## 2023-04-12 DIAGNOSIS — E1122 Type 2 diabetes mellitus with diabetic chronic kidney disease: Secondary | ICD-10-CM | POA: Diagnosis present

## 2023-04-12 DIAGNOSIS — G8929 Other chronic pain: Secondary | ICD-10-CM | POA: Diagnosis present

## 2023-04-12 DIAGNOSIS — Z79899 Other long term (current) drug therapy: Secondary | ICD-10-CM | POA: Diagnosis not present

## 2023-04-12 DIAGNOSIS — R296 Repeated falls: Secondary | ICD-10-CM

## 2023-04-12 DIAGNOSIS — E119 Type 2 diabetes mellitus without complications: Secondary | ICD-10-CM

## 2023-04-12 DIAGNOSIS — N390 Urinary tract infection, site not specified: Principal | ICD-10-CM | POA: Diagnosis present

## 2023-04-12 DIAGNOSIS — Z7984 Long term (current) use of oral hypoglycemic drugs: Secondary | ICD-10-CM

## 2023-04-12 DIAGNOSIS — T83511A Infection and inflammatory reaction due to indwelling urethral catheter, initial encounter: Secondary | ICD-10-CM | POA: Diagnosis present

## 2023-04-12 DIAGNOSIS — Z7982 Long term (current) use of aspirin: Secondary | ICD-10-CM | POA: Diagnosis not present

## 2023-04-12 DIAGNOSIS — Z7952 Long term (current) use of systemic steroids: Secondary | ICD-10-CM

## 2023-04-12 DIAGNOSIS — R338 Other retention of urine: Secondary | ICD-10-CM | POA: Diagnosis present

## 2023-04-12 DIAGNOSIS — W19XXXA Unspecified fall, initial encounter: Secondary | ICD-10-CM | POA: Diagnosis not present

## 2023-04-12 DIAGNOSIS — E785 Hyperlipidemia, unspecified: Secondary | ICD-10-CM | POA: Diagnosis present

## 2023-04-12 DIAGNOSIS — S0993XA Unspecified injury of face, initial encounter: Secondary | ICD-10-CM | POA: Diagnosis not present

## 2023-04-12 DIAGNOSIS — N1831 Chronic kidney disease, stage 3a: Secondary | ICD-10-CM | POA: Diagnosis present

## 2023-04-12 DIAGNOSIS — M5030 Other cervical disc degeneration, unspecified cervical region: Secondary | ICD-10-CM | POA: Diagnosis not present

## 2023-04-12 DIAGNOSIS — N138 Other obstructive and reflux uropathy: Secondary | ICD-10-CM | POA: Diagnosis present

## 2023-04-12 DIAGNOSIS — H548 Legal blindness, as defined in USA: Secondary | ICD-10-CM | POA: Diagnosis present

## 2023-04-12 DIAGNOSIS — M4802 Spinal stenosis, cervical region: Secondary | ICD-10-CM | POA: Diagnosis not present

## 2023-04-12 DIAGNOSIS — E876 Hypokalemia: Secondary | ICD-10-CM | POA: Diagnosis not present

## 2023-04-12 DIAGNOSIS — E1142 Type 2 diabetes mellitus with diabetic polyneuropathy: Secondary | ICD-10-CM | POA: Diagnosis present

## 2023-04-12 DIAGNOSIS — Z66 Do not resuscitate: Secondary | ICD-10-CM | POA: Diagnosis present

## 2023-04-12 DIAGNOSIS — I129 Hypertensive chronic kidney disease with stage 1 through stage 4 chronic kidney disease, or unspecified chronic kidney disease: Secondary | ICD-10-CM | POA: Diagnosis present

## 2023-04-12 DIAGNOSIS — Z9359 Other cystostomy status: Secondary | ICD-10-CM | POA: Diagnosis present

## 2023-04-12 DIAGNOSIS — N3289 Other specified disorders of bladder: Secondary | ICD-10-CM | POA: Diagnosis not present

## 2023-04-12 DIAGNOSIS — Z720 Tobacco use: Secondary | ICD-10-CM

## 2023-04-12 DIAGNOSIS — N179 Acute kidney failure, unspecified: Secondary | ICD-10-CM | POA: Diagnosis present

## 2023-04-12 DIAGNOSIS — N136 Pyonephrosis: Secondary | ICD-10-CM | POA: Diagnosis present

## 2023-04-12 DIAGNOSIS — S0083XA Contusion of other part of head, initial encounter: Secondary | ICD-10-CM | POA: Diagnosis present

## 2023-04-12 DIAGNOSIS — S199XXA Unspecified injury of neck, initial encounter: Secondary | ICD-10-CM | POA: Diagnosis not present

## 2023-04-12 DIAGNOSIS — N289 Disorder of kidney and ureter, unspecified: Secondary | ICD-10-CM

## 2023-04-12 DIAGNOSIS — Z978 Presence of other specified devices: Secondary | ICD-10-CM | POA: Diagnosis present

## 2023-04-12 DIAGNOSIS — R109 Unspecified abdominal pain: Secondary | ICD-10-CM | POA: Diagnosis not present

## 2023-04-12 DIAGNOSIS — K5730 Diverticulosis of large intestine without perforation or abscess without bleeding: Secondary | ICD-10-CM | POA: Diagnosis not present

## 2023-04-12 DIAGNOSIS — R0902 Hypoxemia: Secondary | ICD-10-CM | POA: Diagnosis not present

## 2023-04-12 DIAGNOSIS — R319 Hematuria, unspecified: Secondary | ICD-10-CM | POA: Diagnosis present

## 2023-04-12 DIAGNOSIS — I10 Essential (primary) hypertension: Secondary | ICD-10-CM | POA: Diagnosis not present

## 2023-04-12 DIAGNOSIS — Y828 Other medical devices associated with adverse incidents: Secondary | ICD-10-CM | POA: Diagnosis present

## 2023-04-12 DIAGNOSIS — R531 Weakness: Secondary | ICD-10-CM | POA: Diagnosis present

## 2023-04-12 DIAGNOSIS — S0990XA Unspecified injury of head, initial encounter: Secondary | ICD-10-CM | POA: Diagnosis not present

## 2023-04-12 DIAGNOSIS — N1330 Unspecified hydronephrosis: Secondary | ICD-10-CM | POA: Diagnosis not present

## 2023-04-12 DIAGNOSIS — N401 Enlarged prostate with lower urinary tract symptoms: Secondary | ICD-10-CM | POA: Diagnosis present

## 2023-04-12 LAB — COMPREHENSIVE METABOLIC PANEL WITH GFR
ALT: 18 U/L (ref 0–44)
Anion gap: 12 (ref 5–15)
BUN: 50 mg/dL — ABNORMAL HIGH (ref 8–23)
Calcium: 9 mg/dL (ref 8.9–10.3)
GFR, Estimated: 31 mL/min — ABNORMAL LOW (ref >60)
Glucose, Bld: 155 mg/dL — ABNORMAL HIGH (ref 70–99)
Potassium: 3.7 mmol/L (ref 3.5–5.1)
Sodium: 132 mmol/L — ABNORMAL LOW (ref 135–145)
Total Bilirubin: 1.3 mg/dL — ABNORMAL HIGH (ref 0.0–1.2)
Total Protein: 7.7 g/dL (ref 6.5–8.1)

## 2023-04-12 LAB — CBC
HCT: 44.3 % (ref 39.0–52.0)
Hemoglobin: 14.4 g/dL (ref 13.0–17.0)
MCH: 28 pg (ref 26.0–34.0)
MCHC: 32.5 g/dL (ref 30.0–36.0)
MCV: 86 fL (ref 80.0–100.0)
Platelets: 306 10*3/uL (ref 150–400)
RBC: 5.15 MIL/uL (ref 4.22–5.81)
RDW: 14.1 % (ref 11.5–15.5)
WBC: 10.5 10*3/uL (ref 4.0–10.5)
nRBC: 0 % (ref 0.0–0.2)

## 2023-04-12 LAB — COMPREHENSIVE METABOLIC PANEL
AST: 47 U/L — ABNORMAL HIGH (ref 15–41)
Albumin: 3.3 g/dL — ABNORMAL LOW (ref 3.5–5.0)
Alkaline Phosphatase: 85 U/L (ref 38–126)
CO2: 26 mmol/L (ref 22–32)
Chloride: 94 mmol/L — ABNORMAL LOW (ref 98–111)
Creatinine, Ser: 2.23 mg/dL — ABNORMAL HIGH (ref 0.61–1.24)

## 2023-04-12 LAB — LIPASE, BLOOD: Lipase: 21 U/L (ref 11–51)

## 2023-04-12 NOTE — ED Triage Notes (Signed)
 BIBA from home c/o multiple falls x1 week.  EMS reports poor balance at baseline and has order for PT at home Ems reports intermittent confusion, lower abd pain, decreased urine out put to foley x1 day.  Denies fever, body aches, chills.

## 2023-04-13 ENCOUNTER — Emergency Department (HOSPITAL_COMMUNITY): Payer: Medicare Other

## 2023-04-13 DIAGNOSIS — N3289 Other specified disorders of bladder: Secondary | ICD-10-CM | POA: Diagnosis not present

## 2023-04-13 DIAGNOSIS — M4802 Spinal stenosis, cervical region: Secondary | ICD-10-CM | POA: Diagnosis not present

## 2023-04-13 DIAGNOSIS — S0993XA Unspecified injury of face, initial encounter: Secondary | ICD-10-CM | POA: Diagnosis not present

## 2023-04-13 DIAGNOSIS — M5030 Other cervical disc degeneration, unspecified cervical region: Secondary | ICD-10-CM | POA: Diagnosis not present

## 2023-04-13 DIAGNOSIS — S0990XA Unspecified injury of head, initial encounter: Secondary | ICD-10-CM | POA: Diagnosis not present

## 2023-04-13 DIAGNOSIS — N1330 Unspecified hydronephrosis: Secondary | ICD-10-CM | POA: Diagnosis not present

## 2023-04-13 DIAGNOSIS — K5730 Diverticulosis of large intestine without perforation or abscess without bleeding: Secondary | ICD-10-CM | POA: Diagnosis not present

## 2023-04-13 DIAGNOSIS — N3000 Acute cystitis without hematuria: Secondary | ICD-10-CM | POA: Diagnosis not present

## 2023-04-13 DIAGNOSIS — S199XXA Unspecified injury of neck, initial encounter: Secondary | ICD-10-CM | POA: Diagnosis not present

## 2023-04-13 DIAGNOSIS — R109 Unspecified abdominal pain: Secondary | ICD-10-CM | POA: Diagnosis not present

## 2023-04-13 LAB — URINALYSIS, ROUTINE W REFLEX MICROSCOPIC
Bilirubin Urine: NEGATIVE
Bilirubin Urine: NEGATIVE
Glucose, UA: >=500 mg/dL — AB
Glucose, UA: >=500 mg/dL — AB
Ketones, ur: 5 mg/dL — AB
Ketones, ur: 5 mg/dL — AB
Nitrite: NEGATIVE
Nitrite: NEGATIVE
Protein, ur: 100 mg/dL — AB
Protein, ur: 100 mg/dL — AB
Specific Gravity, Urine: 1.012 (ref 1.005–1.030)
Specific Gravity, Urine: 1.01 (ref 1.005–1.030)
WBC, UA: >50 WBC/hpf (ref 0–5)
WBC, UA: >50 WBC/hpf (ref 0–5)
pH: 5 (ref 5.0–8.0)
pH: 5 (ref 5.0–8.0)

## 2023-04-13 LAB — GLUCOSE, CAPILLARY
Glucose-Capillary: 163 mg/dL — ABNORMAL HIGH (ref 70–99)
Glucose-Capillary: 198 mg/dL — ABNORMAL HIGH (ref 70–99)

## 2023-04-13 LAB — HEMOGLOBIN A1C
Hgb A1c MFr Bld: 8 % — ABNORMAL HIGH (ref 4.8–5.6)
Mean Plasma Glucose: 182.9 mg/dL

## 2023-04-13 MED ORDER — ONDANSETRON HCL 4 MG PO TABS: 4.0000 mg | ORAL_TABLET | Freq: Four times a day (QID) | ORAL | Status: DC | PRN

## 2023-04-13 MED ORDER — ALBUTEROL SULFATE (2.5 MG/3ML) 0.083% IN NEBU: 2.5000 mg | INHALATION_SOLUTION | RESPIRATORY_TRACT | Status: DC | PRN

## 2023-04-13 MED ORDER — INSULIN ASPART 100 UNIT/ML IJ SOLN
0.0000 [IU] | Freq: Every day | INTRAMUSCULAR | Status: DC
  Filled 2023-04-13: qty 0.05

## 2023-04-13 MED ORDER — ACETAMINOPHEN 650 MG RE SUPP: 650.0000 mg | Freq: Four times a day (QID) | RECTAL | Status: DC | PRN

## 2023-04-13 MED ORDER — CHLORHEXIDINE GLUCONATE CLOTH 2 % EX PADS
6.0000 | MEDICATED_PAD | Freq: Every day | CUTANEOUS | Status: DC
  Administered 2023-04-13 – 2023-04-14 (×2): 6 via TOPICAL

## 2023-04-13 MED ORDER — LACTATED RINGERS IV BOLUS
1000.0000 mL | Freq: Once | INTRAVENOUS | Status: AC
  Administered 2023-04-13: 1000 mL via INTRAVENOUS

## 2023-04-13 MED ORDER — SODIUM CHLORIDE 0.9 % IV SOLN
1.0000 g | Freq: Once | INTRAVENOUS | Status: AC
  Administered 2023-04-13: 1 g via INTRAVENOUS
  Filled 2023-04-13: qty 10

## 2023-04-13 MED ORDER — ONDANSETRON HCL 4 MG/2ML IJ SOLN: 4.0000 mg | Freq: Four times a day (QID) | INTRAMUSCULAR | Status: DC | PRN

## 2023-04-13 MED ORDER — ACETAMINOPHEN 325 MG PO TABS
650.0000 mg | ORAL_TABLET | Freq: Four times a day (QID) | ORAL | Status: DC | PRN
  Administered 2023-04-14: 650 mg via ORAL
  Filled 2023-04-13: qty 2

## 2023-04-13 MED ORDER — INSULIN ASPART 100 UNIT/ML IJ SOLN
0.0000 [IU] | Freq: Three times a day (TID) | INTRAMUSCULAR | Status: DC
  Administered 2023-04-13: 3 [IU] via SUBCUTANEOUS
  Administered 2023-04-14: 5 [IU] via SUBCUTANEOUS
  Administered 2023-04-14 (×2): 3 [IU] via SUBCUTANEOUS
  Administered 2023-04-15: 5 [IU] via SUBCUTANEOUS
  Filled 2023-04-13: qty 0.15

## 2023-04-13 MED ORDER — SODIUM CHLORIDE 0.9 % IV SOLN
1.0000 g | INTRAVENOUS | Status: DC
  Administered 2023-04-14: 1 g via INTRAVENOUS
  Filled 2023-04-13: qty 10

## 2023-04-13 MED ORDER — ENOXAPARIN SODIUM 40 MG/0.4ML IJ SOSY: 40.0000 mg | PREFILLED_SYRINGE | INTRAMUSCULAR | Status: DC

## 2023-04-13 MED ORDER — ENOXAPARIN SODIUM 30 MG/0.3ML IJ SOSY
30.0000 mg | PREFILLED_SYRINGE | INTRAMUSCULAR | Status: DC
  Administered 2023-04-13: 30 mg via SUBCUTANEOUS
  Filled 2023-04-13: qty 0.3

## 2023-04-13 MED ORDER — TRAZODONE HCL 50 MG PO TABS
25.0000 mg | ORAL_TABLET | Freq: Every evening | ORAL | Status: DC | PRN
  Administered 2023-04-14: 25 mg via ORAL
  Filled 2023-04-13: qty 1

## 2023-04-13 NOTE — ED Notes (Signed)
[phone]  Zella Ball contact is the nurse.  Please ask pt before calling .

## 2023-04-13 NOTE — ED Notes (Addendum)
Pt refused vital recheck! Says he is going to lay in the floor and maybe that will get him a room to get some sleep.Marland KitchenMarland Kitchen

## 2023-04-13 NOTE — H&P (Signed)
History and Physical  Dustin Duffy ZOX:[id number] DOB: 09-07-1955 DOA: 04/12/2023  PCP: Irena Reichmann, DO   Chief Complaint: Weakness, falls  HPI: Dustin Duffy is a 68 y.o. male with medical history significant for non-insulin-dependent type 2 diabetes, BPH with chronic Foley, dyslipidemia, hypertension, chronic renal insufficiency, legally blind, being admitted to the hospital with weakness, falls at home, acute kidney injury and suspected UTI.  History is provided by the patient, who says he has peripheral neuropathy which makes balance difficult for him, lives on his own, for the last few weeks has been getting more weak.  He has been put on Ozempic by his PCP, and he has been losing weight which she thinks is contributing to his continued weakness.  For the last week or 2, he has also been having some dysuria.  Denies any fevers, chills, nausea.  He last changed his Foley catheter at home yesterday, with the assistance of a friend.  Says that he has had multiple falls at home over the last week.  Here in the emergency department his Foley catheter was replaced, upper body imaging with no acute fracture or other findings, has evidence of acute renal failure and possible UTI.  Given a dose of IV Rocephin, and hospitalist contacted for admission.  Review of Systems: Please see HPI for pertinent positives and negatives. A complete 10 system review of systems are otherwise negative.  Past Medical History:  Diagnosis Date   BPH without urinary obstruction    Diabetes mellitus type 2 in nonobese (HCC)    Dyslipidemia    Essential hypertension    Renal impairment    Visual impairment    History reviewed. No pertinent surgical history. Social History:  reports that he quit smoking about 13 years ago. His smoking use included cigarettes. He started smoking about 48 years ago. He has a 35 pack-year smoking history. He has never used smokeless tobacco. He reports that he does not currently use drugs. No  history on file for alcohol use.  No Known Allergies  History reviewed. No pertinent family history.   Prior to Admission medications   Medication Sig Start Date End Date Taking? Authorizing Provider  amitriptyline (ELAVIL) 10 MG tablet Take 1 tablet (10 mg total) by mouth at bedtime. 06/09/22   Burnadette Pop, MD  aspirin EC 81 MG tablet Take 81 mg by mouth in the morning. Swallow whole.    [provider]  cholecalciferol (VITAMIN D3) 25 MCG (1000 UNIT) tablet Take 1,000 Units by mouth 3 (three) times a week.    [provider]  fludrocortisone (FLORINEF) 0.1 MG tablet Take 1 tablet (0.1 mg total) by mouth daily. 06/09/22   Burnadette Pop, MD  gabapentin (NEURONTIN) 300 MG capsule Take 300 mg by mouth 3 (three) times daily as needed (for pain). 02/05/21   [provider]  glipiZIDE (GLUCOTROL XL) 2.5 MG 24 hr tablet Take 2.5 mg by mouth daily with breakfast.    [provider]  ipratropium (ATROVENT) 0.03 % nasal spray Place 2 sprays into both nostrils 2 (two) times daily as needed for rhinitis.    [provider]  midodrine (PROAMATINE) 5 MG tablet Take 1 tablet (5 mg total) by mouth 3 (three) times daily with meals. 06/09/22   Burnadette Pop, MD  oxyCODONE (OXY IR/ROXICODONE) 5 MG immediate release tablet Take 1 tablet (5 mg total) by mouth every 6 (six) hours as needed for moderate pain. 06/09/22   Burnadette Pop, MD  Semaglutide (OZEMPIC, 0.25 OR  0.5 MG/DOSE, ) Inject 0.5 mg into the skin every Wednesday.    [provider]  simvastatin (ZOCOR) 20 MG tablet Take 1 tablet (20 mg total) by mouth every evening. Do not take this medication until you finish Diflucan. Patient taking differently: Take 20 mg by mouth at bedtime. 05/02/22   Standley Brooking, MD  XIGDUO XR 10-500 MG TB24 Take 1 tablet by mouth daily before breakfast.    [provider]    Physical Exam: BP 135/75   Pulse (!) 108   Temp 97.6 F (36.4 C) (Oral)    Resp 16   Ht 5\' 8"  (1.727 m)   Wt 63 kg   SpO2 96%   BMI 21.12 kg/m  General:  Alert, oriented, calm, in no acute distress, thin, emaciated, but pleasant and cooperative Cardiovascular: RRR, no murmurs or rubs, no peripheral edema  Respiratory: clear to auscultation bilaterally, no wheezes, no crackles  Abdomen: soft, nontender, nondistended, normal bowel tones heard  Skin: dry, no rashes  GU: Foley catheter in place with clear yellow urine Musculoskeletal: no joint effusions, normal range of motion  Psychiatric: appropriate affect, normal speech  Neurologic: extraocular muscles intact, clear speech, moving all extremities with intact sensorium         Labs on Admission:  Basic Metabolic Panel: Recent Labs  Lab 04/12/23 1815  NA 132*  K 3.7  CL 94*  CO2 26  GLUCOSE 155*  BUN 50*  CREATININE 2.23*  CALCIUM 9.0   Liver Function Tests: Recent Labs  Lab 04/12/23 1815  AST 47*  ALT 18  ALKPHOS 85  BILITOT 1.3*  PROT 7.7  ALBUMIN 3.3*   Recent Labs  Lab 04/12/23 1815  LIPASE 21   No results for input(s): "AMMONIA" in the last 168 hours. CBC: Recent Labs  Lab 04/12/23 1815  WBC 10.5  HGB 14.4  HCT 44.3  MCV 86.0  PLT 306   Cardiac Enzymes: No results for input(s): "CKTOTAL", "CKMB", "CKMBINDEX", "TROPONINI" in the last 168 hours. BNP (last 3 results) No results for input(s): "BNP" in the last 8760 hours.  ProBNP (last 3 results) No results for input(s): "PROBNP" in the last 8760 hours.  CBG: No results for input(s): "GLUCAP" in the last 168 hours.  Radiological Exams on Admission: CT Cervical Spine Wo Contrast Result Date: 04/13/2023 CLINICAL DATA:  Polytrauma, blunt.  Multiple falls. EXAM: CT CERVICAL SPINE WITHOUT CONTRAST TECHNIQUE: Multidetector CT imaging of the cervical spine was performed without intravenous contrast. Multiplanar CT image reconstructions were also generated. RADIATION DOSE REDUCTION: This exam was performed according to the  departmental dose-optimization program which includes automated exposure control, adjustment of the mA and/or kV according to patient size and/or use of iterative reconstruction technique. COMPARISON:  04/29/2022 FINDINGS: Alignment: No subluxation.  Loss of normal cervical lordosis. Skull base and vertebrae: No acute fracture. No primary bone lesion or focal pathologic process. Soft tissues and spinal canal: No prevertebral fluid or swelling. No visible canal hematoma. Disc levels: Diffuse degenerative disc disease with disc space narrowing and spurring. Mild bilateral degenerative facet disease. No visible disc herniation. Upper chest: No acute findings Other: None IMPRESSION: Degenerative disc and facet disease.  No acute bony abnormality. Electronically Signed   By: Charlett Nose M.D.   On: 04/13/2023 10:30   CT Head Wo Contrast Result Date: 04/13/2023 CLINICAL DATA:  Polytrauma, blunt.  Multiple falls. EXAM: CT HEAD WITHOUT CONTRAST TECHNIQUE: Contiguous axial images were obtained from the base of the skull through  the vertex without intravenous contrast. RADIATION DOSE REDUCTION: This exam was performed according to the departmental dose-optimization program which includes automated exposure control, adjustment of the mA and/or kV according to patient size and/or use of iterative reconstruction technique. COMPARISON:  04/29/2022 FINDINGS: Brain: Age related volume loss. No acute intracranial abnormality. Specifically, no hemorrhage, hydrocephalus, mass lesion, acute infarction, or significant intracranial injury. Vascular: No hyperdense vessel or unexpected calcification. Skull: No acute calvarial abnormality. Sinuses/Orbits: No acute findings Other: No IMPRESSION: No acute intracranial abnormality. Electronically Signed   By: Charlett Nose M.D.   On: 04/13/2023 10:28   CT Maxillofacial Wo Contrast Result Date: 04/13/2023 CLINICAL DATA:  Polytrauma, blunt.  Multiple falls. EXAM: CT MAXILLOFACIAL WITHOUT  CONTRAST TECHNIQUE: Multidetector CT imaging of the maxillofacial structures was performed. Multiplanar CT image reconstructions were also generated. RADIATION DOSE REDUCTION: This exam was performed according to the departmental dose-optimization program which includes automated exposure control, adjustment of the mA and/or kV according to patient size and/or use of iterative reconstruction technique. COMPARISON:  None Available. FINDINGS: Osseous: No fracture or mandibular dislocation. No destructive process. Orbits: Negative. No traumatic or inflammatory finding. Sinuses: Clear Soft tissues: Negative Limited intracranial: See head CT report IMPRESSION: No facial or orbital fracture. Electronically Signed   By: Charlett Nose M.D.   On: 04/13/2023 10:27   CT Renal Stone Study Result Date: 04/13/2023 CLINICAL DATA:  Multiple falls. Abdominal pain, flank pain. Decreased urine output EXAM: CT ABDOMEN AND PELVIS WITHOUT CONTRAST TECHNIQUE: Multidetector CT imaging of the abdomen and pelvis was performed following the standard protocol without IV contrast. RADIATION DOSE REDUCTION: This exam was performed according to the departmental dose-optimization program which includes automated exposure control, adjustment of the mA and/or kV according to patient size and/or use of iterative reconstruction technique. COMPARISON:  04/29/2022 FINDINGS: Lower chest: Linear atelectasis or scarring in the lung bases. No effusions. Hepatobiliary: No focal hepatic abnormality. Gallbladder unremarkable. Pancreas: No focal abnormality or ductal dilatation. Spleen: No focal abnormality.  Normal size. Adrenals/Urinary Tract: Adrenal glands normal. Moderate to severe bilateral hydronephrosis. No obstructing stones. Foley catheter in place. The balloon is inflated in the region of the lower prostate with the tip of the Foley catheter just into the lower bladder. Marked trabeculation of the bladder wall with in numeral diverticula. There is  a fluid and gas collection within the bladder lumen measuring up to 4 cm. This is of unknown etiology. This conceivably could reflect clot containing locules of gas related to Foley catheter, but is indeterminate. Stomach/Bowel: Sigmoid diverticulosis. No active diverticulitis. Stomach and small bowel decompressed, unremarkable. Moderate stool burden throughout the colon. Normal appendix. Stomach and small bowel decompressed, unremarkable. Vascular/Lymphatic: Aortic atherosclerosis. No evidence of aneurysm or adenopathy. Reproductive: Prostate enlargement Other: No free fluid or free air. Musculoskeletal: No acute bony abnormality. Stable T12 compression fracture. IMPRESSION: Foley catheter balloon inflated in the lower prostate with the tip of the Foley catheter just into the bladder. Recommend deflating the balloon and advancing the Foley catheter further into the bladder. Marked trabeculation of the bladder wall with innumerable bladder wall diverticula. Gas and fluid collection within the is of unknown etiology. Moderate to severe bilateral hydronephrosis. Sigmoid diverticulosis. Aortic atherosclerosis. Electronically Signed   By: Charlett Nose M.D.   On: 04/13/2023 10:25   Assessment/Plan Dustin Duffy is a 68 y.o. male with medical history significant for non-insulin-dependent type 2 diabetes, BPH with chronic Foley, dyslipidemia, hypertension, chronic renal insufficiency, legally blind, being admitted to the hospital with weakness,  falls at home, acute kidney injury and suspected UTI.   UTI-suspected due to worsening weakness, dysuria.  No reported fevers, no leukocytosis.  Foley catheter was exchanged at home yesterday, and once again today in the ER.  Repeat urinalysis obtained after Foley exchange in the ER today. -Observation admission -Continue empiric IV Rocephin -Follow-up urine culture  Acute kidney injury-superimposed on chronic renal insufficiency.  Given his bilateral hydronephrosis and  report of reduced urine output prior to Foley catheter exchange, I suspect he had some bladder outlet obstruction and resultant AKI.  Could also be related to suspected UTI. -Foley catheter has been replaced and is now draining well -No evidence on CT of any further obstruction -Treat UTI as above -Received 1 L IV fluid in the ER -Avoid nephrotoxins, recheck renal function with morning labs  Chronic urinary retention-due to BPH, with chronic Foley catheter.  After CT scan, Foley catheter was repositioned by nursing staff.  Weakness and falls-this is unfortunately a chronic problem for him, he lives on his own and is supposed to be getting home health.  Probably exacerbated by his neuropathy, and blindness and now suspected acute UTI. -PT/OT  Orthostatic hypotension-was last discharged on Florinef and midodrine, will resume these as indicated, once his home medications are reconciled.  Note he has been hypertensive here in the ER.  Non-insulin-dependent type 2 diabetes-update hemoglobin A1c, carb modified diet with sliding scale insulin.  Neuropathy-continue Neurontin  DVT prophylaxis: Lovenox     Code Status: Do not attempt resuscitation (DNR) PRE-ARREST INTERVENTIONS DESIRED  Consults called: None  Admission status: Observation  Time spent: 49 minutes  Roneisha Stern Sharlette Dense MD [facility] Pager [phone]  If 7PM-7AM, please contact night-coverage [email or web address] Password [email or web address]  04/13/2023, 1:09 PM

## 2023-04-13 NOTE — Progress Notes (Signed)
   04/13/23 1956  Assess: MEWS Score  Temp 98.1 F (36.7 C)  BP 114/65  MAP (mmHg) 80  Pulse Rate (!) 111  Resp (!) 24  SpO2 94 %  Assess: MEWS Score  MEWS Temp 0  MEWS Systolic 0  MEWS Pulse 2  MEWS RR 1  MEWS LOC 0  MEWS Score 3  MEWS Score Color Yellow  Assess: if the MEWS score is Yellow or Red  Were vital signs accurate and taken at a resting state? Yes  Does the patient meet 2 or more of the SIRS criteria? No  MEWS guidelines implemented  No, previously yellow, continue vital signs every 4 hours  Provider Notification  Provider Name/Title A.Virgel Manifold, NP  Date Provider Notified 04/13/23  Time Provider Notified 2005  Method of Notification Page (secure chat)  Notification Reason Other (Comment) (Yellow mews)  Provider response No new orders (will continue yellow mews protocol)  Date of Provider Response 04/13/23  Time of Provider Response 2022  Assess: SIRS CRITERIA  SIRS Temperature  0  SIRS Respirations  1  SIRS Pulse 1  SIRS WBC 0  SIRS Score Sum  2    Pt is A&O x 4. On room air. Denies pain. Foley in place. Educated on falls and safety precautions, plan of care, call bell, pt verbalizes understanding and needs reinforcement.  Bed alarm on. Call bell in reach. Will continue to monitor.

## 2023-04-13 NOTE — ED Notes (Signed)
Patients friend, Zella Ball [phone]. Call with updates on patient.

## 2023-04-13 NOTE — ED Provider Notes (Signed)
[place] EMERGENCY DEPARTMENT AT [facility] Provider Note   CSN: [id number] Arrival date & time: 04/12/23  1750     History  Chief Complaint  Patient presents with   Abdominal Pain    Dustin Duffy is a 68 y.o. male.  68 year old male presents today for concern of recurrent falls that started about a week week and a half ago.  He does have chronic balance issues.  He also has had a chronic Foley cath in place.  He states this was swapped out yesterday by a nurse friend.  He states he tries to swap it out once a month.  He states his urine output has decreased.  He does have bruising to his face which he states occurred on a fall Thursday where he struck his face.  He was not evaluated after this.  Not on any blood thinners.  Denies any abdominal pain.  He does endorse decreased fluid intake.  The history is provided by the patient. No language interpreter was used.       Home Medications Prior to Admission medications   Medication Sig Start Date End Date Taking? Authorizing Provider  amitriptyline (ELAVIL) 10 MG tablet Take 1 tablet (10 mg total) by mouth at bedtime. 06/09/22   Burnadette Pop, MD  aspirin EC 81 MG tablet Take 81 mg by mouth in the morning. Swallow whole.    [provider]  cholecalciferol (VITAMIN D3) 25 MCG (1000 UNIT) tablet Take 1,000 Units by mouth 3 (three) times a week.    [provider]  fludrocortisone (FLORINEF) 0.1 MG tablet Take 1 tablet (0.1 mg total) by mouth daily. 06/09/22   Burnadette Pop, MD  gabapentin (NEURONTIN) 300 MG capsule Take 300 mg by mouth 3 (three) times daily as needed (for pain). 02/05/21   [provider]  glipiZIDE (GLUCOTROL XL) 2.5 MG 24 hr tablet Take 2.5 mg by mouth daily with breakfast.    [provider]  ipratropium (ATROVENT) 0.03 % nasal spray Place 2 sprays into both nostrils 2 (two) times daily as needed for rhinitis.    [provider]  midodrine (PROAMATINE) 5 MG  tablet Take 1 tablet (5 mg total) by mouth 3 (three) times daily with meals. 06/09/22   Burnadette Pop, MD  oxyCODONE (OXY IR/ROXICODONE) 5 MG immediate release tablet Take 1 tablet (5 mg total) by mouth every 6 (six) hours as needed for moderate pain. 06/09/22   Burnadette Pop, MD  Semaglutide (OZEMPIC, 0.25 OR 0.5 MG/DOSE, [place]) Inject 0.5 mg into the skin every Wednesday.    [provider]  simvastatin (ZOCOR) 20 MG tablet Take 1 tablet (20 mg total) by mouth every evening. Do not take this medication until you finish Diflucan. Patient taking differently: Take 20 mg by mouth at bedtime. 05/02/22   Standley Brooking, MD  XIGDUO XR 10-500 MG TB24 Take 1 tablet by mouth daily before breakfast.    [provider]      Allergies    Patient has no known allergies.    Review of Systems   Review of Systems  Constitutional:  Positive for fatigue. Negative for chills and fever.  Respiratory:  Negative for shortness of breath.   Gastrointestinal:  Negative for abdominal pain.  Neurological:  Negative for light-headedness and headaches.  All other systems reviewed and are negative.   Physical Exam Updated Vital Signs BP (!) 140/87 (BP Location: Right Arm)   Pulse (!) 109   Temp 97.6 F (36.4  C) (Oral)   Resp 15   Ht 5\' 8"  (1.727 m)   Wt 63 kg   SpO2 96%   BMI 21.12 kg/m  Physical Exam Vitals and nursing note reviewed.  Constitutional:      General: He is not in acute distress.    Appearance: Normal appearance. He is not ill-appearing.  HENT:     Head: Normocephalic and atraumatic.     Nose: Nose normal.  Eyes:     Conjunctiva/sclera: Conjunctivae normal.  Cardiovascular:     Rate and Rhythm: Regular rhythm. Tachycardia present.  Pulmonary:     Effort: Pulmonary effort is normal. No respiratory distress.  Abdominal:     General: There is no distension.     Palpations: Abdomen is soft.     Tenderness: There is no abdominal tenderness. There is no guarding.   Musculoskeletal:        General: No deformity. Normal range of motion.     Cervical back: Normal range of motion.  Skin:    Findings: No rash.  Neurological:     Mental Status: He is alert.     ED Results / Procedures / Treatments   Labs (all labs ordered are listed, but only abnormal results are displayed) Labs Reviewed  COMPREHENSIVE METABOLIC PANEL - Abnormal; Notable for the following components:      Result Value   Sodium 132 (*)    Chloride 94 (*)    Glucose, Bld 155 (*)    BUN 50 (*)    Creatinine, Ser 2.23 (*)    Albumin 3.3 (*)    AST 47 (*)    Total Bilirubin 1.3 (*)    GFR, Estimated 31 (*)    All other components within normal limits  LIPASE, BLOOD  CBC  URINALYSIS, ROUTINE W REFLEX MICROSCOPIC    EKG None  Radiology No results found.  Procedures Procedures    Medications Ordered in ED Medications - No data to display  ED Course/ Medical Decision Making/ A&P                                 Medical Decision Making Amount and/or Complexity of Data Reviewed Labs: ordered. Radiology: ordered.  Risk Decision regarding hospitalization.   Medical Decision Making / ED Course   This patient presents to the ED for concern of decreased urine output, increased falls, this involves an extensive number of treatment options, and is a complaint that carries with it a high risk of complications and morbidity.  The differential diagnosis includes encephalopathy, bacterial infection, UTI, pneumonia, intracranial injury  MDM: 68 year old male presents today for concern of recurrent falls that started about a week ago.  He is tachycardic on exam.  Otherwise well-appearing.  Does have bruising to his face.  This was from a fall that occurred last Thursday.  Will obtain imaging as he was not evaluated after this fall.  Will also obtain CT renal stone study given he has a new AKI and reports decreased urine output.  CBC reveals no leukocytosis or anemia.  CMP  with creatinine of 2.23 which is above from his baseline of around 1, sodium of 132, glucose of 155 otherwise without acute concern.  His UA shows evidence of UTI.  This was obtained off of the new Foley bag after it was exchanged.  CT renal stone study shows bilateral hydronephrosis otherwise without acute finding.  CT head, cervical spine, maxillofacial  showed no evidence of acute bony abnormality.  Lipase is within normal.  Discussed with hospitalist will evaluate patient for admission for AKI in the setting of UTI.   Lab Tests: -I ordered, reviewed, and interpreted labs.   The pertinent results include:   Labs Reviewed  COMPREHENSIVE METABOLIC PANEL - Abnormal; Notable for the following components:      Result Value   Sodium 132 (*)    Chloride 94 (*)    Glucose, Bld 155 (*)    BUN 50 (*)    Creatinine, Ser 2.23 (*)    Albumin 3.3 (*)    AST 47 (*)    Total Bilirubin 1.3 (*)    GFR, Estimated 31 (*)    All other components within normal limits  LIPASE, BLOOD  CBC  URINALYSIS, ROUTINE W REFLEX MICROSCOPIC      EKG  EKG Interpretation Date/Time:    Ventricular Rate:    PR Interval:    QRS Duration:    QT Interval:    QTC Calculation:   R Axis:      Text Interpretation:           Imaging Studies ordered: I ordered imaging studies including CT renal stone study, CT head, CT maxillofacial, CT cervical spine I independently visualized and interpreted imaging. I agree with the radiologist interpretation   Medicines ordered and prescription drug management: No orders of the defined types were placed in this encounter.   -I have reviewed the patients home medicines and have made adjustments as needed   Reevaluation: After the interventions noted above, I reevaluated the patient and found that they have :stayed the same  Co morbidities that complicate the patient evaluation  Past Medical History:  Diagnosis Date   BPH without urinary obstruction    Diabetes  mellitus type 2 in nonobese (HCC)    Dyslipidemia    Essential hypertension    Renal impairment    Visual impairment       Dispostion: Discussed with hospitalist.  They will evaluate patient for admission.  Final Clinical Impression(s) / ED Diagnoses Final diagnoses:  Urinary tract infection with hematuria, site unspecified  AKI (acute kidney injury) [facility])    Rx / DC Orders ED Discharge Orders     None         Marita [place], PA-C 04/13/23 1328    Estelle June A, DO 04/13/23 1639

## 2023-04-14 DIAGNOSIS — S0083XA Contusion of other part of head, initial encounter: Secondary | ICD-10-CM | POA: Diagnosis present

## 2023-04-14 DIAGNOSIS — E876 Hypokalemia: Secondary | ICD-10-CM | POA: Diagnosis not present

## 2023-04-14 DIAGNOSIS — E1142 Type 2 diabetes mellitus with diabetic polyneuropathy: Secondary | ICD-10-CM | POA: Diagnosis present

## 2023-04-14 DIAGNOSIS — N390 Urinary tract infection, site not specified: Secondary | ICD-10-CM | POA: Diagnosis not present

## 2023-04-14 DIAGNOSIS — N401 Enlarged prostate with lower urinary tract symptoms: Secondary | ICD-10-CM | POA: Diagnosis present

## 2023-04-14 DIAGNOSIS — G8929 Other chronic pain: Secondary | ICD-10-CM | POA: Diagnosis present

## 2023-04-14 DIAGNOSIS — I951 Orthostatic hypotension: Secondary | ICD-10-CM | POA: Diagnosis present

## 2023-04-14 DIAGNOSIS — Z79899 Other long term (current) drug therapy: Secondary | ICD-10-CM | POA: Diagnosis not present

## 2023-04-14 DIAGNOSIS — N1831 Chronic kidney disease, stage 3a: Secondary | ICD-10-CM | POA: Diagnosis present

## 2023-04-14 DIAGNOSIS — Y828 Other medical devices associated with adverse incidents: Secondary | ICD-10-CM | POA: Diagnosis present

## 2023-04-14 DIAGNOSIS — Z87891 Personal history of nicotine dependence: Secondary | ICD-10-CM | POA: Diagnosis not present

## 2023-04-14 DIAGNOSIS — T83511A Infection and inflammatory reaction due to indwelling urethral catheter, initial encounter: Secondary | ICD-10-CM | POA: Diagnosis present

## 2023-04-14 DIAGNOSIS — I129 Hypertensive chronic kidney disease with stage 1 through stage 4 chronic kidney disease, or unspecified chronic kidney disease: Secondary | ICD-10-CM | POA: Diagnosis present

## 2023-04-14 DIAGNOSIS — R531 Weakness: Secondary | ICD-10-CM | POA: Diagnosis present

## 2023-04-14 DIAGNOSIS — R338 Other retention of urine: Secondary | ICD-10-CM | POA: Diagnosis present

## 2023-04-14 DIAGNOSIS — N136 Pyonephrosis: Secondary | ICD-10-CM | POA: Diagnosis present

## 2023-04-14 DIAGNOSIS — E785 Hyperlipidemia, unspecified: Secondary | ICD-10-CM | POA: Diagnosis present

## 2023-04-14 DIAGNOSIS — Z66 Do not resuscitate: Secondary | ICD-10-CM | POA: Diagnosis present

## 2023-04-14 DIAGNOSIS — Z7982 Long term (current) use of aspirin: Secondary | ICD-10-CM | POA: Diagnosis not present

## 2023-04-14 DIAGNOSIS — R296 Repeated falls: Secondary | ICD-10-CM | POA: Diagnosis present

## 2023-04-14 DIAGNOSIS — N179 Acute kidney failure, unspecified: Secondary | ICD-10-CM | POA: Diagnosis present

## 2023-04-14 DIAGNOSIS — Z7984 Long term (current) use of oral hypoglycemic drugs: Secondary | ICD-10-CM | POA: Diagnosis not present

## 2023-04-14 DIAGNOSIS — H548 Legal blindness, as defined in USA: Secondary | ICD-10-CM | POA: Diagnosis present

## 2023-04-14 DIAGNOSIS — E1122 Type 2 diabetes mellitus with diabetic chronic kidney disease: Secondary | ICD-10-CM | POA: Diagnosis present

## 2023-04-14 DIAGNOSIS — N138 Other obstructive and reflux uropathy: Secondary | ICD-10-CM | POA: Diagnosis present

## 2023-04-14 DIAGNOSIS — Z7952 Long term (current) use of systemic steroids: Secondary | ICD-10-CM | POA: Diagnosis not present

## 2023-04-14 LAB — CBC
HCT: 38.7 % — ABNORMAL LOW (ref 39.0–52.0)
Hemoglobin: 12.4 g/dL — ABNORMAL LOW (ref 13.0–17.0)
MCH: 27.4 pg (ref 26.0–34.0)
MCHC: 32 g/dL (ref 30.0–36.0)
MCV: 85.6 fL (ref 80.0–100.0)
Platelets: 320 10*3/uL (ref 150–400)
RBC: 4.52 MIL/uL (ref 4.22–5.81)
RDW: 14 % (ref 11.5–15.5)
WBC: 8.3 10*3/uL (ref 4.0–10.5)
nRBC: 0 % (ref 0.0–0.2)

## 2023-04-14 LAB — BASIC METABOLIC PANEL
Anion gap: 14 (ref 5–15)
BUN: 45 mg/dL — ABNORMAL HIGH (ref 8–23)
CO2: 22 mmol/L (ref 22–32)
Calcium: 8.4 mg/dL — ABNORMAL LOW (ref 8.9–10.3)
Chloride: 99 mmol/L (ref 98–111)
Creatinine, Ser: 1.33 mg/dL — ABNORMAL HIGH (ref 0.61–1.24)
GFR, Estimated: 58 mL/min — ABNORMAL LOW (ref >60)
Glucose, Bld: 172 mg/dL — ABNORMAL HIGH (ref 70–99)
Potassium: 3.1 mmol/L — ABNORMAL LOW (ref 3.5–5.1)
Sodium: 135 mmol/L (ref 135–145)

## 2023-04-14 LAB — GLUCOSE, CAPILLARY
Glucose-Capillary: 166 mg/dL — ABNORMAL HIGH (ref 70–99)
Glucose-Capillary: 151 mg/dL — ABNORMAL HIGH (ref 70–99)
Glucose-Capillary: 245 mg/dL — ABNORMAL HIGH (ref 70–99)
Glucose-Capillary: 169 mg/dL — ABNORMAL HIGH (ref 70–99)

## 2023-04-14 MED ORDER — MIRABEGRON ER 25 MG PO TB24
50.0000 mg | ORAL_TABLET | Freq: Every day | ORAL | Status: DC
  Administered 2023-04-14 – 2023-04-15 (×2): 50 mg via ORAL
  Filled 2023-04-14 (×2): qty 2

## 2023-04-14 MED ORDER — FLUDROCORTISONE ACETATE 0.1 MG PO TABS
0.1000 mg | ORAL_TABLET | Freq: Every day | ORAL | Status: DC
  Administered 2023-04-14 – 2023-04-15 (×2): 0.1 mg via ORAL
  Filled 2023-04-14 (×2): qty 1

## 2023-04-14 MED ORDER — SIMVASTATIN 20 MG PO TABS
20.0000 mg | ORAL_TABLET | Freq: Every day | ORAL | Status: DC
  Administered 2023-04-14: 20 mg via ORAL
  Filled 2023-04-14: qty 1

## 2023-04-14 MED ORDER — IPRATROPIUM BROMIDE 0.03 % NA SOLN: 2.0000 | Freq: Two times a day (BID) | NASAL | Status: DC | PRN

## 2023-04-14 MED ORDER — MIDODRINE HCL 5 MG PO TABS
5.0000 mg | ORAL_TABLET | Freq: Three times a day (TID) | ORAL | Status: DC
  Administered 2023-04-15: 5 mg via ORAL
  Filled 2023-04-14: qty 1

## 2023-04-14 MED ORDER — POTASSIUM CHLORIDE CRYS ER 20 MEQ PO TBCR
40.0000 meq | EXTENDED_RELEASE_TABLET | Freq: Once | ORAL | Status: AC
  Administered 2023-04-14: 40 meq via ORAL
  Filled 2023-04-14: qty 2

## 2023-04-14 MED ORDER — AMITRIPTYLINE HCL 25 MG PO TABS
50.0000 mg | ORAL_TABLET | Freq: Every day | ORAL | Status: DC
  Administered 2023-04-14: 50 mg via ORAL
  Filled 2023-04-14: qty 2

## 2023-04-14 MED ORDER — ASPIRIN 81 MG PO TBEC
81.0000 mg | DELAYED_RELEASE_TABLET | Freq: Every day | ORAL | Status: DC
  Administered 2023-04-15: 81 mg via ORAL
  Filled 2023-04-14: qty 1

## 2023-04-14 MED ORDER — ENOXAPARIN SODIUM 40 MG/0.4ML IJ SOSY
40.0000 mg | PREFILLED_SYRINGE | INTRAMUSCULAR | Status: DC
  Administered 2023-04-14: 40 mg via SUBCUTANEOUS
  Filled 2023-04-14: qty 0.4

## 2023-04-14 MED ORDER — GABAPENTIN 300 MG PO CAPS
900.0000 mg | ORAL_CAPSULE | Freq: Every day | ORAL | Status: DC
Start: 2023-04-14 — End: 2023-04-15
  Administered 2023-04-14: 900 mg via ORAL
  Filled 2023-04-14: qty 3

## 2023-04-14 NOTE — Evaluation (Addendum)
Occupational Therapy Evaluation Patient Details Name: Dustin Duffy MRN: [id number] DOB: Aug 11, 1955 Today's Date: 04/14/2023   History of Present Illness Pt is a 68 y.o. male admitted with falls, AKI and UTI. PMH significant for DM Type II, CPH with chronic foley, HTN, legally blind, neuropathy and chronic renal insufficiency.   Clinical Impression   Prior to hospital admission, pt was mod independent with ADLs using a rollator. Pt lives alone and cares for his small dog. Pt has a friend that assists with med setup (pt reports giving himself injections independently), meal prep and grocery shopping, and drives him to medical appointments. Pt currently requires minA for transfers and mobility using RW, up to modA for LB ADLs and requires multimodal cuing due to visual deficits. Pt confused on eval, only oriented to self and STM deficits as pt required continual reorientation to hospital and situation. Although pt states he lives alone and performs personal ADLs, pt with overall poor hygiene and requires cuing for ADL sequencing/thoroughness. Pt will require assistance to order meals and locate items on tray. OT assisted with calling in food order and oriented pt to items in room (call bell, bed features, remote).   Pt would benefit from skilled OT services to address noted impairments and functional limitations (see below for any additional details) in order to maximize safety and independence while minimizing falls risk and caregiver burden. Patient will benefit from continued inpatient follow up therapy, <3 hours/day.       If plan is discharge home, recommend the following: A little help with walking and/or transfers;A little help with bathing/dressing/bathroom;Assistance with cooking/housework;Direct supervision/assist for medications management;Assistance with feeding;Direct supervision/assist for financial management;Assist for transportation;Supervision due to cognitive status    Functional  Status Assessment  Patient has had a recent decline in their functional status and demonstrates the ability to make significant improvements in function in a reasonable and predictable amount of time.  Equipment Recommendations  None recommended by OT (defer)       Precautions / Restrictions Precautions Precautions: Fall Precaution Comments: legally blind Restrictions Weight Bearing Restrictions Per Provider Order: No      Mobility Bed Mobility Overal bed mobility: Needs Assistance Bed Mobility: Supine to Sit, Sit to Supine     Supine to sit: Supervision Sit to supine: Supervision   General bed mobility comments: cues for visual orientation, no physical assist    Transfers Overall transfer level: Needs assistance Equipment used: Rolling walker (2 wheels) Transfers: Sit to/from Stand Sit to Stand: Min assist           General transfer comment: minA to power up, pt requires cues for hand placement      Balance Overall balance assessment: Needs assistance, History of Falls Sitting-balance support: No upper extremity supported, Feet supported Sitting balance-Leahy Scale: Good     Standing balance support: Reliant on assistive device for balance, During functional activity, Single extremity supported Standing balance-Leahy Scale: Fair Standing balance comment: stands at sink with BUE support, minA occ for balance during ADLs                           ADL either performed or assessed with clinical judgement   ADL Overall ADL's : Needs assistance/impaired Eating/Feeding: Minimal assistance;Sitting Eating/Feeding Details (indicate cue type and reason): minA to locate items on tray 2/2 visual impairments, once told where items were, pt able to drink coffee Grooming: Oral care;Wash/dry face;Wash/dry hands;Sitting;Standing;Brushing hair;Minimal assistance Grooming Details (indicate cue type  and reason): sink level for oral care and washing face, minA to locate  items and assist with fmc tasks Upper Body Bathing: Minimal assistance;Sitting Upper Body Bathing Details (indicate cue type and reason): washes UB sitting EOB. Cues for thoroughness  and sequencing.     Upper Body Dressing : Sitting;Minimal assistance Upper Body Dressing Details (indicate cue type and reason): minA to doff sweater due to pt unaware of IV, dons gown with minA Lower Body Dressing: Moderate assistance;Sit to/from stand   Toilet Transfer: Minimal assistance;Rolling walker (2 wheels);Regular Toilet;Cueing for sequencing;Cueing for safety Toilet Transfer Details (indicate cue type and reason): min vcs for navigation and cues to sequence Toileting- Clothing Manipulation and Hygiene: Moderate assistance;Sit to/from stand       Functional mobility during ADLs: Rolling walker (2 wheels);Minimal assistance;Cueing for safety;Cueing for sequencing General ADL Comments: mobility in hallway with RW, mild unsteadiness due to impaired balance. pt with overall poor personal hygiene, potential lice or excessive dandruff noted in hair (RN notified) with redness around scrotum groin area.     Vision Baseline Vision/History: 2 Legally blind Ability to See in Adequate Light: 1 Impaired Additional Comments: pt can see large objects and people. min vcs to navigate environment            Pertinent Vitals/Pain Pain Assessment Pain Assessment: No/denies pain     Extremity/Trunk Assessment Upper Extremity Assessment Upper Extremity Assessment: Generalized weakness   Lower Extremity Assessment Lower Extremity Assessment: Generalized weakness       Communication Communication Communication: No apparent difficulties   Cognition Arousal: Alert Behavior During Therapy: WFL for tasks assessed/performed Overall Cognitive Status: No family/caregiver present to determine baseline cognitive functioning                                 General Comments: A&Ox1, pt reports  feeling generally confused and not knowing where he is. Requires continual orientation from OT. Reports the room is "dark" (legally blind) and perseverates on this     General Comments  Notified RN of excessive dandruff (head lice (?)) and redness in groin area            Home Living Family/patient expects to be discharged to:: Private residence Living Arrangements: Alone Available Help at Discharge: Family;Friend(s);Available PRN/intermittently Type of Home: House Home Access: Stairs to enter [employer] of Steps: 3+1 Entrance Stairs-Rails: Right;Left;Can reach both Home Layout: One level     Bathroom Shower/Tub: Producer, television/film/video: Handicapped height Bathroom Accessibility: Yes   Home Equipment: Agricultural consultant (2 wheels);Rollator (4 wheels)          Prior Functioning/Environment Prior Level of Function : Independent/Modified Independent             Mobility Comments: pt reports use of rollator or SPC ADLs Comments: takes care of small dog at home, friend assists with IADLs and med management        OT Problem List: Decreased strength;Decreased range of motion;Impaired balance (sitting and/or standing);Impaired vision/perception      OT Treatment/Interventions: Self-care/ADL training;Therapeutic exercise;Neuromuscular education;Energy conservation;Therapeutic activities;Patient/family education;Balance training;Visual/perceptual remediation/compensation    OT Goals(Current goals can be found in the care plan section) Acute Rehab OT Goals OT Goal Formulation: With patient Time For Goal Achievement: 04/28/23 Potential to Achieve Goals: Good  OT Frequency: Min 1X/week       AM-PAC OT "6 Clicks" Daily Activity     Outcome Measure Help from  another person eating meals?: A Little Help from another person taking care of personal grooming?: A Little Help from another person toileting, which includes using toliet, bedpan, or urinal?: A  Little Help from another person bathing (including washing, rinsing, drying)?: A Little Help from another person to put on and taking off regular upper body clothing?: A Little Help from another person to put on and taking off regular lower body clothing?: A Little 6 Click Score: 18   End of Session Equipment Utilized During Treatment: Gait belt;Rolling walker (2 wheels) Nurse Communication: Mobility status  Activity Tolerance: Patient tolerated treatment well Patient left: in bed;with call bell/phone within reach;with bed alarm set  OT Visit Diagnosis: Unsteadiness on feet (R26.81);Repeated falls (R29.6);Muscle weakness (generalized) (M62.81)                Time: 4098-1191 OT Time Calculation (min): 44 min Charges:  OT General Charges $OT Visit: 1 Visit OT Evaluation $OT Eval Low Complexity: 1 Low OT Treatments $Self Care/Home Management : 23-37 mins  Durante Violett L. Maridel Pixler, OTR/L  04/14/23, 10:59 AM

## 2023-04-14 NOTE — Care Management Obs Status (Signed)
MEDICARE OBSERVATION STATUS NOTIFICATION   Patient Details  Name: Dustin Duffy MRN: [id number] Date of Birth: May 26, 1955   Medicare Observation Status Notification Given:  Yes    Adrian Prows, RN 04/14/2023, 12:20 PM

## 2023-04-14 NOTE — Evaluation (Signed)
Physical Therapy Evaluation Patient Details Name: Dustin Duffy MRN: [id number] DOB: 12/06/1955 Today's Date: 04/14/2023  History of Present Illness  Pt is a 68 y.o. male admitted with falls, AKI and UTI. PMH significant for DM Type II, CPH with chronic foley, HTN, legally blind, neuropathy and chronic renal insufficiency.  Clinical Impression   Pt admitted with above diagnosis.  Pt currently with functional limitations due to the deficits listed below (see PT Problem List). Pt in bed when PT arrived. Pt indicated he was waiting on lunch. Pt agreeable to therapy intervention if we are on "the lookout for his lunch'. Pt required S for supine <> sit, CGA for sit to stand  from EOB to rollator with min cues, pt unable to recall rollator brake management, gait tasks in hallway with rollator with CGA and progressing to close S with min cues for posture, pt amb with B knee flexion throughout cycle, shortened stride length and limited foot clearance with slightly decreased cadence for 200 feet. No reports of dizziness or SOB, pt indicates R LE pain from an injury in the 90s. Pt elected to return to bed, all immediate needs in place and pt awaiting lunch. Pt will benefit from continued therapy services in home setting. Pt will benefit from acute skilled PT to increase their independence and safety with mobility to allow discharge.         If plan is discharge home, recommend the following: A little help with walking and/or transfers;A little help with bathing/dressing/bathroom;Assistance with cooking/housework;Assist for transportation;Help with stairs or ramp for entrance   Can travel by private vehicle        Equipment Recommendations None recommended by PT  Recommendations for Other Services       Functional Status Assessment Patient has had a recent decline in their functional status and demonstrates the ability to make significant improvements in function in a reasonable and predictable amount of  time.     Precautions / Restrictions Precautions Precautions: Fall Precaution Comments: legally blind Restrictions Weight Bearing Restrictions Per Provider Order: No      Mobility  Bed Mobility Overal bed mobility: Needs Assistance Bed Mobility: Supine to Sit, Sit to Supine     Supine to sit: Supervision Sit to supine: Supervision   General bed mobility comments: cues for visual orientation, no physical assist    Transfers Overall transfer level: Needs assistance Equipment used: Rollator (4 wheels) Transfers: Sit to/from Stand Sit to Stand: Contact guard assist           General transfer comment: cues for proper UE adn AD placement, pt was unable to manage rollator brakes    Ambulation/Gait Ambulation/Gait assistance: Supervision, Contact guard assist Gait Distance (Feet): 200 Feet Assistive device: Rollator (4 wheels) Gait Pattern/deviations: Decreased step length - right, Decreased step length - left, Knee flexed in stance - right, Knee flexed in stance - left, Narrow base of support Gait velocity: slightly decreased     General Gait Details: step almost through pattern with slight trunk flexion and noted B knee flexion throughout gait cycle, cues for posture, proper distance from rollator  Stairs            Wheelchair Mobility     Tilt Bed    Modified Rankin (Stroke Patients Only)       Balance Overall balance assessment: Needs assistance, History of Falls Sitting-balance support: No upper extremity supported, Feet supported Sitting balance-Leahy Scale: Good     Standing balance support: Reliant on assistive device  for balance, During functional activity, Single extremity supported Standing balance-Leahy Scale: Fair Standing balance comment: static standing no UE support with mild postural sway and narrow BOS                             Pertinent Vitals/Pain Pain Assessment Pain Assessment: 0-10 Pain Score: 6  Pain Location:  R proximal lateral LE Pain Descriptors / Indicators: Aching, Constant, Discomfort Pain Intervention(s): Limited activity within patient's tolerance, Monitored during session, Repositioned    Home Living Family/patient expects to be discharged to:: Private residence Living Arrangements: Alone Available Help at Discharge: Family;Friend(s);Available PRN/intermittently Type of Home: House Home Access: Stairs to enter (pt reports one step to enter from the back of the house and is waiting to have a ramp built) Entrance Stairs-Rails: Right;Left;Can reach both Entrance Stairs-Number of Steps: 3+1   Home Layout: One level Home Equipment: Agricultural consultant (2 wheels);Rollator (4 wheels)      Prior Function Prior Level of Function : Independent/Modified Independent             Mobility Comments: pt reports use of rollator or SPC ADLs Comments: takes care of small dog at home, friend assists with IADLs and med management     Extremity/Trunk Assessment   Upper Extremity Assessment Upper Extremity Assessment: Generalized weakness    Lower Extremity Assessment Lower Extremity Assessment: Generalized weakness    Cervical / Trunk Assessment Cervical / Trunk Assessment:  (slight head forward)  Communication   Communication Communication: No apparent difficulties  Cognition Arousal: Alert Behavior During Therapy: WFL for tasks assessed/performed Overall Cognitive Status: No family/caregiver present to determine baseline cognitive functioning                                 General Comments: pt perceverating on lunch tray being late        General Comments General comments (skin integrity, edema, etc.): communication with nursing per pt request for lunch and trunk covered with small red petechiae like rash    Exercises     Assessment/Plan    PT Assessment Patient needs continued PT services  PT Problem List Decreased strength;Decreased activity tolerance;Decreased  balance;Decreased mobility;Decreased coordination;Pain       PT Treatment Interventions DME instruction;Gait training;Stair training;Functional mobility training;Therapeutic activities;Therapeutic exercise;Balance training;Neuromuscular re-education;Patient/family education    PT Goals (Current goals can be found in the Care Plan section)  Acute Rehab PT Goals Patient Stated Goal: to be able to get stronger and go home PT Goal Formulation: With patient Time For Goal Achievement: 04/28/23 Potential to Achieve Goals: Good    Frequency Min 1X/week     Co-evaluation               AM-PAC PT "6 Clicks" Mobility  Outcome Measure Help needed turning from your back to your side while in a flat bed without using bedrails?: None Help needed moving from lying on your back to sitting on the side of a flat bed without using bedrails?: None Help needed moving to and from a bed to a chair (including a wheelchair)?: A Little Help needed standing up from a chair using your arms (e.g., wheelchair or bedside chair)?: A Little Help needed to walk in hospital room?: A Little Help needed climbing 3-5 steps with a railing? : A Lot 6 Click Score: 19    End of Session Equipment Utilized During Treatment:  Gait belt Activity Tolerance: Patient tolerated treatment well;No increased pain Patient left: in bed;with call bell/phone within reach;with bed alarm set Nurse Communication: Mobility status PT Visit Diagnosis: Unsteadiness on feet (R26.81);Other abnormalities of gait and mobility (R26.89);Muscle weakness (generalized) (M62.81);Repeated falls (R29.6);Difficulty in walking, not elsewhere classified (R26.2);Pain Pain - Right/Left: Right Pain - part of body: Hip;Leg    Time: 1323-1340 PT Time Calculation (min) (ACUTE ONLY): 17 min   Charges:   PT Evaluation $PT Eval Low Complexity: 1 Low   PT General Charges $$ ACUTE PT VISIT: 1 Visit         Johnny Bridge, PT Acute Rehab   Jacqualyn Posey 04/14/2023, 1:55 PM

## 2023-04-14 NOTE — Progress Notes (Signed)
Progress Note   Patient: Dustin Duffy ZOX:[id number] DOB: Jun 06, 1955 DOA: 04/12/2023     68 DOS: the patient was seen and examined on 04/14/2023   Brief hospital course: 68yo with h/o DM, BPH with chronic indwelling Foley, HTN, HLD, and chronic visual impairment who presented on 1/15 with generalized weakness and falls.  He was recently started by his PCP on Ozempic.  +dysuria.  Suspected UTI, started on Ceftriaxone.  AKI noted, started on IVF.  Assessment and Plan:  Generalized weakness with falls Symptoms started after he was started on Ozempic BMI is 21, stop Ozempic Also with peripheral neuropathy and chronic visual impairment PT/OT consults Lives alone, ?need for rehab - patient prefers [facility] assistance Continue gabapentin  Dysuria, indwelling foley UTI suspected due to worsening weakness, dysuria No reported fevers, no leukocytosis Foley catheter was exchanged at home 1 day PTA and again in the ER Repeat urinalysis obtained after Foley exchange - suggestive of infection Follow-up urine culture Continue Ceftriaxone  AKI on CKD Baseline stage 3a CKD AKI on admission, creatinine 2.23, GFR 31 - resolved this AM Given his bilateral hydronephrosis and report of reduced urine output prior to Foley catheter exchange, likely with bladder outlet obstruction and resultant AKI Could also be related to suspected UTI Foley catheter has been replaced and is now draining well No evidence on CT of any further obstruction Treat UTI as above Avoid nephrotoxins, recheck renal function with morning labs  Hypokalemia Repleted Recheck BMP in AM  Orthostatic hypotension Last discharged on Florinef and midodrine, will resume  Prior h/o HTN but no longer on medications  Chronic urinary retention, BPH Has foley Continue Myrbetriq  HLD Continue simvastatin  Chronic visual impairment Negatively impacts ambulation - but he reports being independent at home  DM A1c is 8 - slightly above  goal [employer] with moderate-scale SSI   Chronic pain Continue amitriptyline, gabapentin  Frequent falls Facial edema and excoriation noted PT/OT consulted  TOC consulted  Will order [facility] with RN/aide/PT/OT/SW (patient does not want to go to rehab)  DNR Confirmed on admission     Consultants: PT OT TOC team  Procedures: None  Antibiotics: Ceftriaxone 1/16-      Subjective: Feeling much better, thinks he needs 1 more day of hospitalization.  No specific complaints.   Objective: Vitals:   04/14/23 0744 04/14/23 1255  BP: 128/83 (!) 139/96  Pulse: (!) 103 (!) 106  Resp: 16   Temp: (!) 97.4 F (36.3 C) 98.1 F (36.7 C)  SpO2: 98% 96%    Intake/Output Summary (Last 24 hours) at 04/14/2023 1655 Last data filed at 04/14/2023 1428 Gross per 24 hour  Intake 720 ml  Output 2200 ml  Net -1480 ml   Filed Weights   04/12/23 1757  Weight: 63 kg    Exam:  General:  Appears frail, disheveled Eyes:  chronic visual impairment ENT:  facial trauma including nasal bridge excoriation and edema Neck:  no LAD, masses or thyromegaly Cardiovascular:  RR with mild tachycardia. No LE edema.  Respiratory:   CTA bilaterally with no wheezes/rales/rhonchi.  Normal respiratory effort. Abdomen:  soft, NT, ND Skin:  no rash or induration seen on limited exam Musculoskeletal:  no bony abnormality Psychiatric:  blunted mood and affect, speech fluent and appropriate Neurologic:  CN 2-12 grossly intact, moves all extremities in coordinated fashion  Data Reviewed: I have reviewed the patient's lab results since admission.  Pertinent labs for today include:  K+ 3.1 Glucose 172 BUN 45/Creatinine  1.33/GFR 58, improved from admission WBC 8.3 Hgb 12.4 UA: >500 glucose, 5 ketones, moderate LE, 100 protein, many bacteria Urine culture pending    Family Communication: None present  Disposition: Status is: Inpatient Admit - It is my clinical opinion that admission to  INPATIENT is reasonable and necessary because of the expectation that this patient will require hospital care that crosses at least 2 midnights to treat this condition based on the medical complexity of the problems presented.  Given the aforementioned information, the predictability of an adverse outcome is felt to be significant.      Time spent: 50 minutes  Unresulted Labs (From admission, onward)     Start     Ordered   04/15/23 0500  Basic metabolic panel  Tomorrow morning,   R       Question:  Specimen collection method  Answer:  Lab=Lab collect   04/14/23 1642   04/15/23 0500  CBC with Differential/Platelet  Tomorrow morning,   R       Question:  Specimen collection method  Answer:  Lab=Lab collect   04/14/23 1642   04/13/23 1258  Urine Culture (for pregnant, neutropenic or urologic patients or patients with an indwelling urinary catheter)  (Urine Labs)  Once,   URGENT       Question:  Indication  Answer:  Urgency/frequency   04/13/23 1258             Author: [name], MD 04/14/2023 4:55 PM  For on call review [email or web address].

## 2023-04-14 NOTE — Hospital Course (Signed)
68yo with h/o DM, BPH with chronic indwelling Foley, HTN, HLD, and chronic visual impairment who presented on 1/15 with generalized weakness and falls.  He was recently started by his PCP on Ozempic.  +dysuria.  Suspected UTI, started on Ceftriaxone.  AKI noted, started on IVF.

## 2023-04-14 NOTE — Plan of Care (Signed)
Pt is Dustin Duffy. VSS, on room air. Denies pain. Foley cath in place.  Morning labs K 3.1. A.Chavez, NP notified via secure chat and new order received.  Safety maintained. Bed alarm on. Call bell in reach. Will continue to monitor.   Problem: Education: Goal: Ability to describe self-care measures that may prevent or decrease complications (Diabetes Survival Skills Education) will improve Outcome: Progressing Goal: Individualized Educational Video(s) Outcome: Progressing   Problem: Coping: Goal: Ability to adjust to condition or change in health will improve Outcome: Progressing   Problem: Fluid Volume: Goal: Ability to maintain a balanced intake and output will improve Outcome: Progressing   Problem: Health Behavior/Discharge Planning: Goal: Ability to identify and utilize available resources and services will improve Outcome: Progressing Goal: Ability to manage health-related needs will improve Outcome: Progressing   Problem: Metabolic: Goal: Ability to maintain appropriate glucose levels will improve Outcome: Progressing   Problem: Nutritional: Goal: Maintenance of adequate nutrition will improve Outcome: Progressing Goal: Progress toward achieving an optimal weight will improve Outcome: Progressing   Problem: Skin Integrity: Goal: Risk for impaired skin integrity will decrease Outcome: Progressing   Problem: Tissue Perfusion: Goal: Adequacy of tissue perfusion will improve Outcome: Progressing   Problem: Education: Goal: Knowledge of General Education information will improve Description: Including pain rating scale, medication(s)/side effects and non-pharmacologic comfort measures Outcome: Progressing   Problem: Health Behavior/Discharge Planning: Goal: Ability to manage health-related needs will improve Outcome: Progressing   Problem: Clinical Measurements: Goal: Ability to maintain clinical measurements within normal limits will improve Outcome:  Progressing Goal: Will remain free from infection Outcome: Progressing Goal: Diagnostic test results will improve Outcome: Progressing Goal: Respiratory complications will improve Outcome: Progressing Goal: Cardiovascular complication will be avoided Outcome: Progressing   Problem: Activity: Goal: Risk for activity intolerance will decrease Outcome: Progressing   Problem: Nutrition: Goal: Adequate nutrition will be maintained Outcome: Progressing   Problem: Coping: Goal: Level of anxiety will decrease Outcome: Progressing   Problem: Elimination: Goal: Will not experience complications related to bowel motility Outcome: Progressing Goal: Will not experience complications related to urinary retention Outcome: Progressing   Problem: Pain Managment: Goal: General experience of comfort will improve and/or be controlled Outcome: Progressing   Problem: Safety: Goal: Ability to remain free from injury will improve Outcome: Progressing   Problem: Skin Integrity: Goal: Risk for impaired skin integrity will decrease Outcome: Progressing

## 2023-04-14 NOTE — TOC Progression Note (Signed)
Transition of Care [facility]) - Progression Note    Patient Details  Name: Dustin Duffy MRN: [id number] Date of Birth: [birth year]  Transition of Care [facility]) CM/SW Contact  Adrian Prows, RN Phone Number: 04/14/2023, 4:37 PM  Clinical Narrative:    PT recc HHPT; pt previously had HH services w/ [facility]; he would like to con't receiving services from agency; Dr Ophelia Charter notified; awaiting resumption of care orders and face to face; Marisue Humble at [facility] notified; agency contact info placed in follow up provider section of d/c instructions.   Expected Discharge Plan: Home w Home Health Services Barriers to Discharge: Continued Medical Work up  Expected Discharge Plan and Services   Discharge Planning Services: CM Consult   Living arrangements for the past 2 months: Single Family Home                           HH Arranged: RN, PT, OT, Nurse's Aide, Social Work [employer] Agency: [facility] Date HH Agency Contacted: 04/14/23 Time HH Agency Contacted: 1231 Representative spoke with at [facility] Agency: Marisue Humble   Social Determinants of Health (SDOH) Interventions SDOH Screenings   Food Insecurity: No Food Insecurity (04/14/2023)  Housing: Low Risk  (04/14/2023)  Transportation Needs: No Transportation Needs (04/14/2023)  Utilities: Not At Risk (04/14/2023)  Depression (PHQ2-9): Low Risk  (04/02/2021)  Social Connections: Moderately Isolated (04/13/2023)  Tobacco Use: Medium Risk (04/12/2023)    Readmission Risk Interventions    05/02/2022   10:09 AM  Readmission Risk Prevention Plan  Transportation Screening Complete  PCP or Specialist Appt within 5-7 Days Complete  Home Care Screening Complete  Medication Review (RN CM) Complete

## 2023-04-14 NOTE — TOC Initial Note (Addendum)
Transition of Care [facility]) - Initial/Assessment Note    Patient Details  Name: Dustin Duffy MRN: [id number] Date of Birth: 12-05-1955  Transition of Care [facility]) CM/SW Contact:    Adrian Prows, RN Phone Number: 04/14/2023, 12:34 PM  Clinical Narrative:                 TOC for d/c planning; spoke w/ pt in room; pt says he lives at home alone; he plans to return at d/c; pt identified POC [name] (friend) [phone]; he identified PCP/insurance; pt says he will arrange transportation; he denies SDOH risks; pt says he has cane, walker, wheelchair; [facility] services w/ Rolene Arbour, HPT/OT/RN/Aide/SW; he does not have home oxygen; Marisue Humble at [facility] given pt location; she says they can see pt, and will need orders; contact info for agency placed in follow up provider section of d/c instructions; also this RN, CM explained MOON to pt; he verbalized understanding, and signed document; copy of MOON given to pt; TOC will follow.; awaiting PT/OT evals. Expected Discharge Plan: Home w Home Health Services Barriers to Discharge: Continued Medical Work up   Patient Goals and CMS Choice Patient states their goals for this hospitalization and ongoing recovery are:: home CMS Medicare.gov Compare Post Acute Care list provided to:: Patient        Expected Discharge Plan and Services   Discharge Planning Services: CM Consult   Living arrangements for the past 2 months: Single Family Home                           HH Arranged: RN, PT, OT, Nurse's Aide, Social Work [facility] Agency: [facility] Date HH Agency Contacted: 04/14/23 Time HH Agency Contacted: 1231 Representative spoke with at [facility] Agency: Marisue Humble  Prior Living Arrangements/Services Living arrangements for the past 2 months: Single Family Home Lives with:: Self Patient language and need for interpreter reviewed:: Yes Do you feel safe going back to the place where you live?: Yes      Need for Family Participation in  Patient Care: Yes (Comment) Care giver support system in place?: Yes (comment) Current home services: DME, Home OT, Home PT, Home RN, Homehealth aide, Other (comment) (cane, walker, wheelchair; [facility] services w/ Rolene Arbour) Criminal Activity/Legal Involvement Pertinent to Current Situation/Hospitalization: No - Comment as needed  Activities of Daily Living   ADL Screening (condition at time of admission) Independently performs ADLs?: Yes (appropriate for developmental age) Is the patient deaf or have difficulty hearing?: No Does the patient have difficulty seeing, even when wearing glasses/contacts?: Yes Does the patient have difficulty concentrating, remembering, or making decisions?: No  Permission Sought/Granted Permission sought to share information with : Case Manager Permission granted to share information with : Yes, Verbal Permission Granted  Share Information with NAME: Case Manager     Permission granted to share info w Relationship: [name] (friend) [phone]     Emotional Assessment Appearance:: Appears stated age Attitude/Demeanor/Rapport: Gracious Affect (typically observed): Accepting Orientation: : Oriented to Self, Oriented to Place, Oriented to  Time, Oriented to Situation Alcohol / Substance Use: Not Applicable Psych Involvement: No (comment)  Admission diagnosis:  UTI (urinary tract infection) [N39.0] AKI (acute kidney injury) (HCC) [N17.9] Urinary tract infection with hematuria, site unspecified [N39.0, R31.9] Patient Active Problem List   Diagnosis Date Noted   Falls frequently 06/06/2022   Acute kidney injury (HCC) 04/29/2022   BPH with urinary obstruction 04/29/2022   Foley catheter in place  prior to arrival 04/29/2022   Frequent falls 04/29/2022   Multiple fractures of ribs, left side, initial encounter for closed fracture 04/29/2022   Sacral fracture, closed (HCC) 04/29/2022   Closed wedge compression fracture of T12 vertebra (HCC) 04/29/2022    UTI (urinary tract infection) 04/29/2022   Diabetes mellitus type 2 in nonobese (HCC) 02/09/2021   Dyslipidemia 02/09/2021   Essential hypertension 02/09/2021   Renal impairment 02/09/2021   Non-traumatic rhabdomyolysis 02/09/2021   Visual impairment 02/09/2021   PCP:  Irena Reichmann, DO Pharmacy:   [facility] [phone] - [place],  - [address] AT [facility] [address] [place] [address] Phone: [phone] Fax: [phone]     Social Drivers of Health (SDOH) Social History: SDOH Screenings   Food Insecurity: No Food Insecurity (04/14/2023)  Housing: Low Risk  (04/14/2023)  Transportation Needs: No Transportation Needs (04/14/2023)  Utilities: Not At Risk (04/14/2023)  Depression (PHQ2-9): Low Risk  (04/02/2021)  Social Connections: Moderately Isolated (04/13/2023)  Tobacco Use: Medium Risk (04/12/2023)   SDOH Interventions: Food Insecurity Interventions: Intervention Not Indicated, Inpatient TOC Housing Interventions: Intervention Not Indicated, Inpatient TOC Transportation Interventions: Intervention Not Indicated, Inpatient TOC Utilities Interventions: Intervention Not Indicated, Inpatient TOC   Readmission Risk Interventions    05/02/2022   10:09 AM  Readmission Risk Prevention Plan  Transportation Screening Complete  PCP or Specialist Appt within 5-7 Days Complete  Home Care Screening Complete  Medication Review (RN CM) Complete

## 2023-04-15 ENCOUNTER — Other Ambulatory Visit (HOSPITAL_COMMUNITY): Payer: Self-pay

## 2023-04-15 DIAGNOSIS — N390 Urinary tract infection, site not specified: Secondary | ICD-10-CM | POA: Diagnosis not present

## 2023-04-15 LAB — CBC WITH DIFFERENTIAL/PLATELET
Abs Immature Granulocytes: 0.06 10*3/uL (ref 0.00–0.07)
Basophils Absolute: 0.1 10*3/uL (ref 0.0–0.1)
Basophils Relative: 1 %
Eosinophils Absolute: 0.2 10*3/uL (ref 0.0–0.5)
Eosinophils Relative: 2 %
HCT: 36.4 % — ABNORMAL LOW (ref 39.0–52.0)
Hemoglobin: 11.9 g/dL — ABNORMAL LOW (ref 13.0–17.0)
Immature Granulocytes: 1 %
Lymphocytes Relative: 16 %
Lymphs Abs: 1.2 10*3/uL (ref 0.7–4.0)
MCH: 27.7 pg (ref 26.0–34.0)
MCHC: 32.7 g/dL (ref 30.0–36.0)
MCV: 84.7 fL (ref 80.0–100.0)
Monocytes Absolute: 0.9 10*3/uL (ref 0.1–1.0)
Monocytes Relative: 11 %
Neutro Abs: 5.3 10*3/uL (ref 1.7–7.7)
Neutrophils Relative %: 69 %
Platelets: 278 10*3/uL (ref 150–400)
RBC: 4.3 MIL/uL (ref 4.22–5.81)
RDW: 13.9 % (ref 11.5–15.5)
WBC: 7.6 10*3/uL (ref 4.0–10.5)
nRBC: 0 % (ref 0.0–0.2)

## 2023-04-15 LAB — BASIC METABOLIC PANEL
Anion gap: 7 (ref 5–15)
BUN: 29 mg/dL — ABNORMAL HIGH (ref 8–23)
CO2: 27 mmol/L (ref 22–32)
Calcium: 8 mg/dL — ABNORMAL LOW (ref 8.9–10.3)
Chloride: 98 mmol/L (ref 98–111)
Creatinine, Ser: 0.76 mg/dL (ref 0.61–1.24)
GFR, Estimated: >60 mL/min (ref >60)
Glucose, Bld: 174 mg/dL — ABNORMAL HIGH (ref 70–99)
Potassium: 3 mmol/L — ABNORMAL LOW (ref 3.5–5.1)
Sodium: 132 mmol/L — ABNORMAL LOW (ref 135–145)

## 2023-04-15 LAB — GLUCOSE, CAPILLARY
Glucose-Capillary: 208 mg/dL — ABNORMAL HIGH (ref 70–99)
Glucose-Capillary: 149 mg/dL — ABNORMAL HIGH (ref 70–99)

## 2023-04-15 MED ORDER — CEFADROXIL 500 MG PO CAPS
1000.0000 mg | ORAL_CAPSULE | Freq: Two times a day (BID) | ORAL | 0 refills | Status: AC
  Filled 2023-04-15: qty 20, 5d supply, fill #0

## 2023-04-15 MED ORDER — ACETAMINOPHEN 325 MG PO TABS: 650.0000 mg | ORAL_TABLET | Freq: Four times a day (QID) | ORAL | Status: DC | PRN

## 2023-04-15 MED ORDER — POTASSIUM CHLORIDE CRYS ER 20 MEQ PO TBCR
40.0000 meq | EXTENDED_RELEASE_TABLET | Freq: Once | ORAL | Status: AC
  Administered 2023-04-15: 40 meq via ORAL
  Filled 2023-04-15: qty 2

## 2023-04-15 NOTE — Plan of Care (Signed)
  Problem: Education: Goal: Ability to describe self-care measures that may prevent or decrease complications (Diabetes Survival Skills Education) will improve Outcome: Adequate for Discharge Goal: Individualized Educational Video(s) Outcome: Adequate for Discharge   Problem: Coping: Goal: Ability to adjust to condition or change in health will improve Outcome: Adequate for Discharge   Problem: Fluid Volume: Goal: Ability to maintain a balanced intake and output will improve Outcome: Adequate for Discharge   Problem: Health Behavior/Discharge Planning: Goal: Ability to identify and utilize available resources and services will improve Outcome: Adequate for Discharge Goal: Ability to manage health-related needs will improve Outcome: Adequate for Discharge   Problem: Metabolic: Goal: Ability to maintain appropriate glucose levels will improve Outcome: Adequate for Discharge   Problem: Nutritional: Goal: Maintenance of adequate nutrition will improve Outcome: Adequate for Discharge Goal: Progress toward achieving an optimal weight will improve Outcome: Adequate for Discharge   Problem: Skin Integrity: Goal: Risk for impaired skin integrity will decrease Outcome: Adequate for Discharge   Problem: Education: Goal: Knowledge of General Education information will improve Description: Including pain rating scale, medication(s)/side effects and non-pharmacologic comfort measures Outcome: Adequate for Discharge   Problem: Tissue Perfusion: Goal: Adequacy of tissue perfusion will improve Outcome: Adequate for Discharge   Problem: Health Behavior/Discharge Planning: Goal: Ability to manage health-related needs will improve Outcome: Adequate for Discharge   Problem: Clinical Measurements: Goal: Ability to maintain clinical measurements within normal limits will improve Outcome: Adequate for Discharge Goal: Will remain free from infection Outcome: Adequate for Discharge Goal:  Diagnostic test results will improve Outcome: Adequate for Discharge Goal: Respiratory complications will improve Outcome: Adequate for Discharge Goal: Cardiovascular complication will be avoided Outcome: Adequate for Discharge   Problem: Activity: Goal: Risk for activity intolerance will decrease Outcome: Adequate for Discharge   Problem: Coping: Goal: Level of anxiety will decrease Outcome: Adequate for Discharge   Problem: Elimination: Goal: Will not experience complications related to bowel motility Outcome: Adequate for Discharge Goal: Will not experience complications related to urinary retention Outcome: Adequate for Discharge   Problem: Nutrition: Goal: Adequate nutrition will be maintained Outcome: Adequate for Discharge   Problem: Safety: Goal: Ability to remain free from injury will improve Outcome: Adequate for Discharge   Problem: Skin Integrity: Goal: Risk for impaired skin integrity will decrease Outcome: Adequate for Discharge

## 2023-04-15 NOTE — Progress Notes (Signed)
Pt connected to leg bag for discharge per his request

## 2023-04-15 NOTE — Discharge Summary (Signed)
Physician Discharge Summary   Patient: Dustin Duffy MRN: [id number] DOB: [birth year]  Admit date:     04/12/2023  Discharge date: 04/15/23  Discharge Physician: [name]   PCP: Irena Reichmann, DO   Recommendations at discharge:   You are being discharged with home health (PT, OT, RN, aide, SW) Complete antibiotics (cefadroxil 1000 mg BID x 5 more days starting 1/19) Stop Ozempic Follow up with Dr. Thomasena Edis in 1-2 weeks You are also being referred for outpatient lung cancer screening  Discharge Diagnoses: Principal Problem:   Complicated UTI (urinary tract infection) Active Problems:   Acute kidney injury (HCC)   Diabetes mellitus type 2 in nonobese [facility])   Dyslipidemia   Essential hypertension   Renal impairment   Visual impairment   BPH with urinary obstruction   Foley catheter in place prior to arrival   Frequent falls    Hospital Course: 68yo with h/o DM, BPH with chronic indwelling Foley, HTN, HLD, and chronic visual impairment who presented on 1/15 with generalized weakness and falls.  He was recently started by his PCP on Ozempic.  +dysuria.  Suspected UTI, started on Ceftriaxone.  AKI noted, started on IVF.  Assessment and Plan:    Complicated UTI, indwelling foley UTI suspected due to worsening weakness, dysuria No reported fevers, no leukocytosis Foley catheter was exchanged at home 1 day PTA and again in the ER Repeat urinalysis obtained after Foley exchange - suggestive of infection Urine culture with >100k colonies GNR, C&S pending 2 doses of Ceftriaxone as of noon today Will transition to Cefadroxil 1000 mg PO BID x 5 more days  Generalized weakness with falls Symptoms started after he was started on Ozempic BMI is 21, stop Ozempic Also with peripheral neuropathy and chronic visual impairment PT/OT consults Lives alone, ?need for rehab - patient prefers [facility] assistance Continue gabapentin   AKI on CKD Baseline stage 3a CKD AKI on admission,  creatinine 2.23, GFR 31 - resolved this AM Given his bilateral hydronephrosis and report of reduced urine output prior to Foley catheter exchange, likely with bladder outlet obstruction and resultant AKI + UTI Foley catheter has been replaced and is now draining well No evidence on CT of any further obstruction Treat UTI as above Avoid nephrotoxins   Hypokalemia Repleted   Orthostatic hypotension Last discharged on Florinef and midodrine, resumed on admission with appropriate BP during hospitalization Prior h/o HTN but no longer on medications   Chronic urinary retention, BPH Has foley Continue Myrbetriq   HLD Continue simvastatin   Chronic visual impairment Negatively impacts ambulation - but he reports being independent at home   DM A1c is 8 - slightly above goal Resume Xigduo   Chronic pain Continue amitriptyline, gabapentin   Frequent falls Facial edema and excoriation noted PT/OT consulted  TOC consulted  Will order [facility] with RN/aide/PT/OT/SW (patient does not want to go to rehab)   DNR Confirmed on admission         Consultants: PT OT TOC team   Procedures: None   Antibiotics: Ceftriaxone 1/17-18 Cefadroxil 1000 mg BID 1/19-23  Pain control - [employer] Controlled Substance Reporting System database was reviewed. and patient was instructed, not to drive, operate heavy machinery, perform activities at heights, swimming or participation in water activities or provide baby-sitting services while on Pain, Sleep and Anxiety Medications; until their outpatient Physician has advised to do so again. Also recommended to not to take more than prescribed Pain, Sleep and Anxiety Medications.   Disposition: Home  Diet recommendation:  Carb modified diet DISCHARGE MEDICATION: Allergies as of 04/15/2023   No Known Allergies      Medication List     STOP taking these medications    ibuprofen 200 MG tablet Commonly known as: ADVIL       TAKE these  medications    acetaminophen 325 MG tablet Commonly known as: TYLENOL Take 2 tablets (650 mg total) by mouth every 6 (six) hours as needed for mild pain (pain score 1-3) (or Fever >/= 101).   amitriptyline 50 MG tablet Commonly known as: ELAVIL Take 50 mg by mouth at bedtime.   aspirin EC 81 MG tablet Take 81 mg by mouth in the morning. Swallow whole.   cefadroxil 500 MG capsule Commonly known as: DURICEF Take 2 capsules (1,000 mg total) by mouth 2 (two) times daily for 5 days. Start taking on: April 16, 2023   cholecalciferol 25 MCG (1000 UNIT) tablet Commonly known as: VITAMIN D3 Take 1,000 Units by mouth every Monday, Wednesday, and Friday.   fludrocortisone 0.1 MG tablet Commonly known as: FLORINEF Take 1 tablet (0.1 mg total) by mouth daily.   gabapentin 300 MG capsule Commonly known as: NEURONTIN Take 900 mg by mouth at bedtime.   ipratropium 0.03 % nasal spray Commonly known as: ATROVENT Place 2 sprays into both nostrils 2 (two) times daily as needed for rhinitis.   midodrine 5 MG tablet Commonly known as: PROAMATINE Take 1 tablet (5 mg total) by mouth 3 (three) times daily with meals.   Myrbetriq 50 MG Tb24 tablet Generic drug: mirabegron ER Take 50 mg by mouth in the morning.   simvastatin 20 MG tablet Commonly known as: ZOCOR Take 1 tablet (20 mg total) by mouth every evening. Do not take this medication until you finish Diflucan. What changed:  when to take this additional instructions   Xigduo XR 10-500 MG Tb24 Generic drug: Dapagliflozin Pro-metFORMIN ER Take 1 tablet by mouth daily before breakfast.        Follow-up Information     [facility] Follow up.   Specialty: Home Health Services Contact information: [address] [place] [address] [place] [address] [phone]                Discharge Exam:   Subjective: Feeling better, has everything he needs at home, feels good about discharge, wants to get  home to his 70yo chihuahua   Objective: Vitals:   04/14/23 1942 04/15/23 0427  BP: (!) 151/85 108/78  Pulse: (!) 109 (!) 102  Resp: 20 20  Temp: 98.3 F (36.8 C) 97.9 F (36.6 C)  SpO2: 96% 92%    Intake/Output Summary (Last 24 hours) at 04/15/2023 1159 Last data filed at 04/15/2023 0830 Gross per 24 hour  Intake 2000.38 ml  Output 3100 ml  Net -1099.62 ml   Filed Weights   04/12/23 1757  Weight: 63 kg    Exam:  General:  Appears frail but in NAD Eyes:  chronic visual impairment ENT:  facial trauma including nasal bridge excoriation and edema Neck:  no LAD, masses or thyromegaly Cardiovascular:  RR with mild tachycardia. No LE edema.  Respiratory:   CTA bilaterally with no wheezes/rales/rhonchi.  Normal respiratory effort. Abdomen:  soft, NT, ND Skin:  no rash or induration seen on limited exam Musculoskeletal:  no bony abnormality Psychiatric:  blunted mood and affect, speech fluent and appropriate Neurologic:  CN 2-12 grossly intact, moves all extremities in coordinated fashion  Data Reviewed: I have reviewed the patient's lab results since admission.  Pertinent labs for today include:   Na++ 132 K+ 3.0 Glucose 174 BUN 29/Creatinine 0.76/GFR >60, normalized WBC 7.6 Hgb 11.9    Condition at discharge: improving  The results of significant diagnostics from this hospitalization (including imaging, microbiology, ancillary and laboratory) are listed below for reference.   Imaging Studies: CT Cervical Spine Wo Contrast Result Date: 04/13/2023 CLINICAL DATA:  Polytrauma, blunt.  Multiple falls. EXAM: CT CERVICAL SPINE WITHOUT CONTRAST TECHNIQUE: Multidetector CT imaging of the cervical spine was performed without intravenous contrast. Multiplanar CT image reconstructions were also generated. RADIATION DOSE REDUCTION: This exam was performed according to the departmental dose-optimization program which includes automated exposure control, adjustment of the mA and/or  kV according to patient size and/or use of iterative reconstruction technique. COMPARISON:  04/29/2022 FINDINGS: Alignment: No subluxation.  Loss of normal cervical lordosis. Skull base and vertebrae: No acute fracture. No primary bone lesion or focal pathologic process. Soft tissues and spinal canal: No prevertebral fluid or swelling. No visible canal hematoma. Disc levels: Diffuse degenerative disc disease with disc space narrowing and spurring. Mild bilateral degenerative facet disease. No visible disc herniation. Upper chest: No acute findings Other: None IMPRESSION: Degenerative disc and facet disease.  No acute bony abnormality. Electronically Signed   By: Charlett Nose M.D.   On: 04/13/2023 10:30   CT Head Wo Contrast Result Date: 04/13/2023 CLINICAL DATA:  Polytrauma, blunt.  Multiple falls. EXAM: CT HEAD WITHOUT CONTRAST TECHNIQUE: Contiguous axial images were obtained from the base of the skull through the vertex without intravenous contrast. RADIATION DOSE REDUCTION: This exam was performed according to the departmental dose-optimization program which includes automated exposure control, adjustment of the mA and/or kV according to patient size and/or use of iterative reconstruction technique. COMPARISON:  04/29/2022 FINDINGS: Brain: Age related volume loss. No acute intracranial abnormality. Specifically, no hemorrhage, hydrocephalus, mass lesion, acute infarction, or significant intracranial injury. Vascular: No hyperdense vessel or unexpected calcification. Skull: No acute calvarial abnormality. Sinuses/Orbits: No acute findings Other: No IMPRESSION: No acute intracranial abnormality. Electronically Signed   By: Charlett Nose M.D.   On: 04/13/2023 10:28   CT Maxillofacial Wo Contrast Result Date: 04/13/2023 CLINICAL DATA:  Polytrauma, blunt.  Multiple falls. EXAM: CT MAXILLOFACIAL WITHOUT CONTRAST TECHNIQUE: Multidetector CT imaging of the maxillofacial structures was performed. Multiplanar CT image  reconstructions were also generated. RADIATION DOSE REDUCTION: This exam was performed according to the departmental dose-optimization program which includes automated exposure control, adjustment of the mA and/or kV according to patient size and/or use of iterative reconstruction technique. COMPARISON:  None Available. FINDINGS: Osseous: No fracture or mandibular dislocation. No destructive process. Orbits: Negative. No traumatic or inflammatory finding. Sinuses: Clear Soft tissues: Negative Limited intracranial: See head CT report IMPRESSION: No facial or orbital fracture. Electronically Signed   By: Charlett Nose M.D.   On: 04/13/2023 10:27   CT Renal Stone Study Result Date: 04/13/2023 CLINICAL DATA:  Multiple falls. Abdominal pain, flank pain. Decreased urine output EXAM: CT ABDOMEN AND PELVIS WITHOUT CONTRAST TECHNIQUE: Multidetector CT imaging of the abdomen and pelvis was performed following the standard protocol without IV contrast. RADIATION DOSE REDUCTION: This exam was performed according to the departmental dose-optimization program which includes automated exposure control, adjustment of the mA and/or kV according to patient size and/or use of iterative reconstruction technique. COMPARISON:  04/29/2022 FINDINGS: Lower chest: Linear atelectasis or scarring in the lung bases. No effusions. Hepatobiliary: No focal hepatic abnormality.  Gallbladder unremarkable. Pancreas: No focal abnormality or ductal dilatation. Spleen: No focal abnormality.  Normal size. Adrenals/Urinary Tract: Adrenal glands normal. Moderate to severe bilateral hydronephrosis. No obstructing stones. Foley catheter in place. The balloon is inflated in the region of the lower prostate with the tip of the Foley catheter just into the lower bladder. Marked trabeculation of the bladder wall with in numeral diverticula. There is a fluid and gas collection within the bladder lumen measuring up to 4 cm. This is of unknown etiology. This  conceivably could reflect clot containing locules of gas related to Foley catheter, but is indeterminate. Stomach/Bowel: Sigmoid diverticulosis. No active diverticulitis. Stomach and small bowel decompressed, unremarkable. Moderate stool burden throughout the colon. Normal appendix. Stomach and small bowel decompressed, unremarkable. Vascular/Lymphatic: Aortic atherosclerosis. No evidence of aneurysm or adenopathy. Reproductive: Prostate enlargement Other: No free fluid or free air. Musculoskeletal: No acute bony abnormality. Stable T12 compression fracture. IMPRESSION: Foley catheter balloon inflated in the lower prostate with the tip of the Foley catheter just into the bladder. Recommend deflating the balloon and advancing the Foley catheter further into the bladder. Marked trabeculation of the bladder wall with innumerable bladder wall diverticula. Gas and fluid collection within the is of unknown etiology. Moderate to severe bilateral hydronephrosis. Sigmoid diverticulosis. Aortic atherosclerosis. Electronically Signed   By: Charlett Nose M.D.   On: 04/13/2023 10:25    Microbiology: Results for orders placed or performed during the hospital encounter of 04/12/23  Urine Culture (for pregnant, neutropenic or urologic patients or patients with an indwelling urinary catheter)     Status: Abnormal (Preliminary result)   Collection Time: 04/13/23  1:22 PM   Specimen: Urine, Catheterized  Result Value Ref Range Status   Specimen Description   Final    URINE, CATHETERIZED Performed at [facility] [address] [place], [place] [address]    Special Requests   Final    NONE Performed at [facility], [address] [place], [place] [address]    Culture (A)  Final    >=100,000 COLONIES/mL GRAM NEGATIVE RODS IDENTIFICATION TO FOLLOW Performed at [facility], [address] [place], [place] [address]    Report Status PENDING  Incomplete     Labs: CBC: Recent Labs  Lab 04/12/23 1815 04/14/23 0441 04/15/23 0507  WBC 10.5 8.3 7.6  NEUTROABS  --   --  5.3  HGB 14.4 12.4* 11.9*  HCT 44.3 38.7* 36.4*  MCV 86.0 85.6 84.7  PLT 306 320 278   Basic Metabolic Panel: Recent Labs  Lab 04/12/23 1815 04/14/23 0441 04/15/23 0507  NA 132* 135 132*  K 3.7 3.1* 3.0*  CL 94* 99 98  CO2 26 22 27   GLUCOSE 155* 172* 174*  BUN 50* 45* 29*  CREATININE 2.23* 1.33* 0.76  CALCIUM 9.0 8.4* 8.0*   Liver Function Tests: Recent Labs  Lab 04/12/23 1815  AST 47*  ALT 18  ALKPHOS 85  BILITOT 1.3*  PROT 7.7  ALBUMIN 3.3*   CBG: Recent Labs  Lab 04/14/23 1302 04/14/23 1646 04/14/23 2032 04/15/23 0753 04/15/23 1147  GLUCAP 151* 245* 169* 208* 149*    Discharge time spent: greater than 30 minutes.  Signed: [name], MD [facility] 04/15/2023

## 2023-04-15 NOTE — Plan of Care (Signed)
   Problem: Clinical Measurements: Goal: Will remain free from infection Outcome: Progressing   Problem: Safety: Goal: Ability to remain free from injury will improve Outcome: Progressing   Problem: Skin Integrity: Goal: Risk for impaired skin integrity will decrease Outcome: Progressing

## 2023-04-16 LAB — URINE CULTURE

## 2023-04-17 ENCOUNTER — Telehealth: Payer: Self-pay

## 2023-04-17 DIAGNOSIS — E119 Type 2 diabetes mellitus without complications: Secondary | ICD-10-CM

## 2023-04-17 DIAGNOSIS — R296 Repeated falls: Secondary | ICD-10-CM

## 2023-04-17 DIAGNOSIS — N390 Urinary tract infection, site not specified: Secondary | ICD-10-CM

## 2023-04-17 NOTE — Transitions of Care (Post Inpatient/ED Visit) (Signed)
04/17/2023  Name: Dustin Duffy MRN: [id number] DOB: Sep 24, 1955  Today's TOC FU Call Status: Today's TOC FU Call Status:: Successful TOC FU Call Completed TOC FU Call Complete Date: 04/17/23 Patient's Name and Date of Birth confirmed.  Transition Care Management Follow-up Telephone Call Date of Discharge: 04/15/23 Discharge Facility: [facility]) Type of Discharge: Inpatient Admission Primary Inpatient Discharge Diagnosis:: Complicated UTI (urinary tract infection) How have you been since you were released from the hospital?: Better Any questions or concerns?: No  Items Reviewed: Did you receive and understand the discharge instructions provided?: Yes (Reviewed by [name]) Medications obtained,verified, and reconciled?: Yes (Medications Reviewed) Any new allergies since your discharge?: No Dietary orders reviewed?: Yes Type of Diet Ordered:: Heart Healthy, Carb Modified Do you have support at home?: Yes People in Home: alone Name of Support/Comfort Primary Source: Has Sister Marga Hoots  Medications Reviewed Today: Medications Reviewed Today     Reviewed by Johnnette Barrios, RN (Registered Nurse) on 04/17/23 at 1553  Med List Status: <None>   Medication Order Taking? Sig Documenting Provider Last Dose Status Informant  acetaminophen (TYLENOL) 325 MG tablet [id number] Yes Take 2 tablets (650 mg total) by mouth every 6 (six) hours as needed for mild pain (pain score 1-3) (or Fever >/= 101). Dustin Blue, MD Taking Active   amitriptyline (ELAVIL) 50 MG tablet [id number] Yes Take 50 mg by mouth at bedtime. [provider] Taking Active Child, Pharmacy Records  aspirin EC 81 MG tablet [id number] Yes Take 81 mg by mouth in the morning. Swallow whole. [provider] Taking Active Child, Pharmacy Records  cefadroxil (DURICEF) 500 MG capsule [id number] Yes Take 2 capsules (1,000 mg total) by mouth 2 (two) times daily for 5 days. Dustin Blue, MD  Taking Active   cholecalciferol (VITAMIN D3) 25 MCG (1000 UNIT) tablet [id number] Yes Take 1,000 Units by mouth every Monday, Wednesday, and Friday. [provider] Taking Active Child, Pharmacy Records  fludrocortisone (FLORINEF) 0.1 MG tablet [id number] Yes Take 1 tablet (0.1 mg total) by mouth daily. Burnadette Pop, MD Taking Active Child, Pharmacy Records  gabapentin (NEURONTIN) 300 MG capsule [id number] Yes Take 900 mg by mouth at bedtime. [provider] Taking Active Child, Pharmacy Records           Med Note (Duffy, Dustin C   Thu Apr 13, 2023  1:58 PM) Pt is adamant he has been taking 900 nightly.   ipratropium (ATROVENT) 0.03 % nasal spray [id number] No Place 2 sprays into both nostrils 2 (two) times daily as needed for rhinitis.  Patient not taking: Reported on 04/17/2023   [provider] Not Taking Active Child, Pharmacy Records           Med Note (Duffy, Dustin C   Thu Apr 13, 2023  1:59 PM) Pt and Zella Ball are unsure of last dose.   midodrine (PROAMATINE) 5 MG tablet [id number] Yes Take 1 tablet (5 mg total) by mouth 3 (three) times daily with meals. Burnadette Pop, MD Taking Active Child, Pharmacy Records  MYRBETRIQ 50 MG TB24 tablet [id number] Yes Take 50 mg by mouth in the morning. [provider] Taking Active Child, Pharmacy Records  simvastatin (ZOCOR) 20 MG tablet [id number] Yes Take 1 tablet (20 mg total) by mouth every evening. Do not take this medication until you finish Diflucan.  Patient taking differently: Take 20 mg by mouth at bedtime.   Standley Brooking, MD Taking Active Child, Pharmacy Records  XIGDUO XR 10-500  MG TB24 [id number] Yes Take 1 tablet by mouth daily before breakfast. [provider] Taking Active Child, Pharmacy Records            Home Care and Equipment/Supplies: Were Home Health Services Ordered?: Yes [facility] Follow up.   Specialty: Home Health Services  Contact information:   [address]  [place] [address]  [place] [address]  [phone]) Name of Home Health Agency:: [facility] ( had referal prior to hospital Has Agency set up a time to come to your home?: Yes First Home Health Visit Date: 04/19/23 Any new equipment or medical supplies ordered?: No  Functional Questionnaire: Do you need assistance with bathing/showering or dressing?: No Do you need assistance with meal preparation?: Yes Do you need assistance with eating?: No Do you have difficulty maintaining continence: Yes (Has indwelling catheter due to Neurogenic bladder, managed by friend Zella Ball He is able to empty and flush daily) Do you need assistance with getting out of bed/getting out of a chair/moving?: No (amb independently) Do you have difficulty managing or taking your medications?: Yes (Friend sets up pill boxes am pm for month)  Follow up appointments reviewed: PCP Follow-up appointment confirmed?: Yes Date of PCP follow-up appointment?: 04/11/23 Follow-up Provider: Irena Reichmann Had PCP appt day before admittted Friwnd will follow-up with her Specialist Hospital Follow-up appointment confirmed?: Yes Date of Specialist follow-up appointment?: 05/23/23 Follow-Up Specialty Provider:: Urology 2/25 Pulmonology ( Lung) TBS by friend Do you need transportation to your follow-up appointment?: No (Friend transports) Do you understand care options if your condition(s) worsen?: Yes-patient verbalized understanding  SDOH Interventions Today    Flowsheet Row Most Recent Value  SDOH Interventions   Food Insecurity Interventions Intervention Not Indicated  Housing Interventions Intervention Not Indicated  [Lives in private home 5 steps uses back door front has ramp for him and senior dog Lily.]  Transportation Interventions Intervention Not Indicated, Patient Resources (Friends/Family)  Utilities Interventions Intervention Not Indicated  Social Connections Interventions Other (Comment)  [SDOH referral for  community services]      Interventions Today    Flowsheet Row Most Recent Value  Chronic Disease   Chronic disease during today's visit Diabetes  General Interventions   General Interventions Discussed/Reviewed General Interventions Reviewed, Doctor Visits, Referral to Nurse  Doctor Visits Discussed/Reviewed PCP, Specialist  PCP/Specialist Visits Compliance with follow-up visit  Exercise Interventions   Exercise Discussed/Reviewed Physical Activity  Physical Activity Discussed/Reviewed Physical Activity Reviewed  Mental Health Interventions   Mental Health Discussed/Reviewed Coping Strategies  Nutrition Interventions   Nutrition Discussed/Reviewed Nutrition Reviewed  Pharmacy Interventions   Pharmacy Dicussed/Reviewed Medications and their functions  Safety Interventions   Safety Discussed/Reviewed Fall Risk, Safety Reviewed       Based on current information and Insurance plan -Reviewed benefits available to patient, including details about eligibility options for care if any area of needs were identified.  Reviewed patients ability to access and / or navigating the benefits system..Amb Referral for social support  refer to orders section of note for details    Patient is at high risk for readmission and/or has history of  high utilization  Discussed VBCI  TOC program and weekly calls to patient to assess condition/status, medication management  and provide support/education as indicated . Patient/ Caregiver voiced understanding and declined enrollment in the 30-day TOC Program.  Spoke at length with friend Zella Ball Child psychotherapist) She is a strong support, manages his health concerns, medications and catheter care. She generally provides  transportation to appointments. She feels between herself and [facility] Nursing PT/OT they will be able to manage his follow-up care and did not feel they needed additional TOC calls Will defer to [facility] Nursing to avoid duplication in Case Management services  per request     Reviewed goals for care Patient / Caregiver verbalizes understanding of instructions and care plan provided. Patient was encouraged to make informed decisions about their care, actively participate in managing their health condition, and implement lifestyle changes as needed to promote independence and self-management of health care    The patient / caregiver  has been provided with contact information for the care management team and has been advised to call with any health related questions or concerns.    Susa Loffler , BSN, RN [facility]   [facility] RN Care Manager Direct Dial [phone] Fax [phone] Website: [email or web address]

## 2023-04-18 ENCOUNTER — Telehealth: Payer: Self-pay | Admitting: *Deleted

## 2023-04-18 NOTE — Progress Notes (Signed)
Complex Care Management Note  Care Guide Note 04/18/2023 Name: Dustin Duffy MRN: [id number] DOB: Aug 20, 1955  [name] is a 68 y.o. year old male who sees Irena Reichmann, [place] for primary care. I reached out to [name] by phone today to offer complex care management services.  [name] was given information about Complex Care Management services today including:   The Complex Care Management services include support from the care team which includes your Nurse Coordinator, Clinical Social Worker, or Pharmacist.  The Complex Care Management team is here to help remove barriers to the health concerns and goals most important to you. Complex Care Management services are voluntary, and the patient may decline or stop services at any time by request to their care team member.   Complex Care Management Consent Status: Patient did not agree to participate in complex care management services at this time.   Encounter Outcome:  Patient Refused  Gwenevere Ghazi  [facility]  [facility], [facility] Guide  Direct Dial: [phone]  Fax [phone]

## 2023-04-19 DIAGNOSIS — N136 Pyonephrosis: Secondary | ICD-10-CM | POA: Diagnosis not present

## 2023-04-19 DIAGNOSIS — Z7982 Long term (current) use of aspirin: Secondary | ICD-10-CM | POA: Diagnosis not present

## 2023-04-19 DIAGNOSIS — R296 Repeated falls: Secondary | ICD-10-CM | POA: Diagnosis not present

## 2023-04-19 DIAGNOSIS — E785 Hyperlipidemia, unspecified: Secondary | ICD-10-CM | POA: Diagnosis not present

## 2023-04-19 DIAGNOSIS — J309 Allergic rhinitis, unspecified: Secondary | ICD-10-CM | POA: Diagnosis not present

## 2023-04-19 DIAGNOSIS — N401 Enlarged prostate with lower urinary tract symptoms: Secondary | ICD-10-CM | POA: Diagnosis not present

## 2023-04-19 DIAGNOSIS — E875 Hyperkalemia: Secondary | ICD-10-CM | POA: Diagnosis not present

## 2023-04-19 DIAGNOSIS — E1140 Type 2 diabetes mellitus with diabetic neuropathy, unspecified: Secondary | ICD-10-CM | POA: Diagnosis not present

## 2023-04-19 DIAGNOSIS — I951 Orthostatic hypotension: Secondary | ICD-10-CM | POA: Diagnosis not present

## 2023-04-19 DIAGNOSIS — G8929 Other chronic pain: Secondary | ICD-10-CM | POA: Diagnosis not present

## 2023-04-19 DIAGNOSIS — R2681 Unsteadiness on feet: Secondary | ICD-10-CM | POA: Diagnosis not present

## 2023-04-19 DIAGNOSIS — G5711 Meralgia paresthetica, right lower limb: Secondary | ICD-10-CM | POA: Diagnosis not present

## 2023-04-19 DIAGNOSIS — H4720 Unspecified optic atrophy: Secondary | ICD-10-CM | POA: Diagnosis not present

## 2023-04-19 DIAGNOSIS — N179 Acute kidney failure, unspecified: Secondary | ICD-10-CM | POA: Diagnosis not present

## 2023-04-19 DIAGNOSIS — H548 Legal blindness, as defined in USA: Secondary | ICD-10-CM | POA: Diagnosis not present

## 2023-04-19 DIAGNOSIS — E559 Vitamin D deficiency, unspecified: Secondary | ICD-10-CM | POA: Diagnosis not present

## 2023-04-19 DIAGNOSIS — E1122 Type 2 diabetes mellitus with diabetic chronic kidney disease: Secondary | ICD-10-CM | POA: Diagnosis not present

## 2023-04-19 DIAGNOSIS — R531 Weakness: Secondary | ICD-10-CM | POA: Diagnosis not present

## 2023-04-19 DIAGNOSIS — I129 Hypertensive chronic kidney disease with stage 1 through stage 4 chronic kidney disease, or unspecified chronic kidney disease: Secondary | ICD-10-CM | POA: Diagnosis not present

## 2023-04-19 DIAGNOSIS — M47812 Spondylosis without myelopathy or radiculopathy, cervical region: Secondary | ICD-10-CM | POA: Diagnosis not present

## 2023-04-19 DIAGNOSIS — Z466 Encounter for fitting and adjustment of urinary device: Secondary | ICD-10-CM | POA: Diagnosis not present

## 2023-04-19 DIAGNOSIS — Z7984 Long term (current) use of oral hypoglycemic drugs: Secondary | ICD-10-CM | POA: Diagnosis not present

## 2023-04-19 DIAGNOSIS — R338 Other retention of urine: Secondary | ICD-10-CM | POA: Diagnosis not present

## 2023-04-19 DIAGNOSIS — I700 Atherosclerosis of aorta: Secondary | ICD-10-CM | POA: Diagnosis not present

## 2023-04-19 DIAGNOSIS — N1831 Chronic kidney disease, stage 3a: Secondary | ICD-10-CM | POA: Diagnosis not present

## 2023-04-21 ENCOUNTER — Telehealth: Payer: Self-pay | Admitting: *Deleted

## 2023-04-21 NOTE — Progress Notes (Signed)
Complex Care Management Note  Care Guide Note 04/21/2023 Name: Dustin Duffy MRN: [id number] DOB: Sep 16, 1955  Dustin Duffy is a 68 y.o. year old male who sees Irena Reichmann, [place] for primary care. I reached out to [name] by phone today to offer complex care management services.  Mr. Maher was given information about Complex Care Management services today including:   The Complex Care Management services include support from the care team which includes your Nurse Coordinator, Clinical Social Worker, or Pharmacist.  The Complex Care Management team is here to help remove barriers to the health concerns and goals most important to you. Complex Care Management services are voluntary, and the patient may decline or stop services at any time by request to their care team member.   Complex Care Management Consent Status: Patient agreed to services and verbal consent obtained.   Follow up plan:  Telephone appointment with complex care management team member scheduled for:  2/13  Encounter Outcome:  Patient Scheduled  Gwenevere Ghazi  [facility]  [facility], [facility] Guide  Direct Dial: [phone]  Fax [phone]

## 2023-04-24 DIAGNOSIS — I129 Hypertensive chronic kidney disease with stage 1 through stage 4 chronic kidney disease, or unspecified chronic kidney disease: Secondary | ICD-10-CM | POA: Diagnosis not present

## 2023-04-24 DIAGNOSIS — E1122 Type 2 diabetes mellitus with diabetic chronic kidney disease: Secondary | ICD-10-CM | POA: Diagnosis not present

## 2023-04-24 DIAGNOSIS — N1831 Chronic kidney disease, stage 3a: Secondary | ICD-10-CM | POA: Diagnosis not present

## 2023-04-24 DIAGNOSIS — E1140 Type 2 diabetes mellitus with diabetic neuropathy, unspecified: Secondary | ICD-10-CM | POA: Diagnosis not present

## 2023-04-24 DIAGNOSIS — N136 Pyonephrosis: Secondary | ICD-10-CM | POA: Diagnosis not present

## 2023-04-24 DIAGNOSIS — N179 Acute kidney failure, unspecified: Secondary | ICD-10-CM | POA: Diagnosis not present

## 2023-04-25 DIAGNOSIS — E1122 Type 2 diabetes mellitus with diabetic chronic kidney disease: Secondary | ICD-10-CM | POA: Diagnosis not present

## 2023-04-25 DIAGNOSIS — N179 Acute kidney failure, unspecified: Secondary | ICD-10-CM | POA: Diagnosis not present

## 2023-04-25 DIAGNOSIS — N1831 Chronic kidney disease, stage 3a: Secondary | ICD-10-CM | POA: Diagnosis not present

## 2023-04-25 DIAGNOSIS — I129 Hypertensive chronic kidney disease with stage 1 through stage 4 chronic kidney disease, or unspecified chronic kidney disease: Secondary | ICD-10-CM | POA: Diagnosis not present

## 2023-04-25 DIAGNOSIS — N136 Pyonephrosis: Secondary | ICD-10-CM | POA: Diagnosis not present

## 2023-04-25 DIAGNOSIS — E1140 Type 2 diabetes mellitus with diabetic neuropathy, unspecified: Secondary | ICD-10-CM | POA: Diagnosis not present

## 2023-04-27 DIAGNOSIS — E1140 Type 2 diabetes mellitus with diabetic neuropathy, unspecified: Secondary | ICD-10-CM | POA: Diagnosis not present

## 2023-04-27 DIAGNOSIS — I129 Hypertensive chronic kidney disease with stage 1 through stage 4 chronic kidney disease, or unspecified chronic kidney disease: Secondary | ICD-10-CM | POA: Diagnosis not present

## 2023-04-27 DIAGNOSIS — N179 Acute kidney failure, unspecified: Secondary | ICD-10-CM | POA: Diagnosis not present

## 2023-04-27 DIAGNOSIS — E1122 Type 2 diabetes mellitus with diabetic chronic kidney disease: Secondary | ICD-10-CM | POA: Diagnosis not present

## 2023-04-27 DIAGNOSIS — N1831 Chronic kidney disease, stage 3a: Secondary | ICD-10-CM | POA: Diagnosis not present

## 2023-04-27 DIAGNOSIS — N136 Pyonephrosis: Secondary | ICD-10-CM | POA: Diagnosis not present

## 2023-05-01 DIAGNOSIS — I129 Hypertensive chronic kidney disease with stage 1 through stage 4 chronic kidney disease, or unspecified chronic kidney disease: Secondary | ICD-10-CM | POA: Diagnosis not present

## 2023-05-01 DIAGNOSIS — E1122 Type 2 diabetes mellitus with diabetic chronic kidney disease: Secondary | ICD-10-CM | POA: Diagnosis not present

## 2023-05-01 DIAGNOSIS — E1140 Type 2 diabetes mellitus with diabetic neuropathy, unspecified: Secondary | ICD-10-CM | POA: Diagnosis not present

## 2023-05-01 DIAGNOSIS — N1831 Chronic kidney disease, stage 3a: Secondary | ICD-10-CM | POA: Diagnosis not present

## 2023-05-01 DIAGNOSIS — N179 Acute kidney failure, unspecified: Secondary | ICD-10-CM | POA: Diagnosis not present

## 2023-05-01 DIAGNOSIS — N136 Pyonephrosis: Secondary | ICD-10-CM | POA: Diagnosis not present

## 2023-05-04 DIAGNOSIS — E1122 Type 2 diabetes mellitus with diabetic chronic kidney disease: Secondary | ICD-10-CM | POA: Diagnosis not present

## 2023-05-04 DIAGNOSIS — E1140 Type 2 diabetes mellitus with diabetic neuropathy, unspecified: Secondary | ICD-10-CM | POA: Diagnosis not present

## 2023-05-04 DIAGNOSIS — I129 Hypertensive chronic kidney disease with stage 1 through stage 4 chronic kidney disease, or unspecified chronic kidney disease: Secondary | ICD-10-CM | POA: Diagnosis not present

## 2023-05-04 DIAGNOSIS — N1831 Chronic kidney disease, stage 3a: Secondary | ICD-10-CM | POA: Diagnosis not present

## 2023-05-04 DIAGNOSIS — N136 Pyonephrosis: Secondary | ICD-10-CM | POA: Diagnosis not present

## 2023-05-04 DIAGNOSIS — N179 Acute kidney failure, unspecified: Secondary | ICD-10-CM | POA: Diagnosis not present

## 2023-05-08 DIAGNOSIS — N136 Pyonephrosis: Secondary | ICD-10-CM | POA: Diagnosis not present

## 2023-05-08 DIAGNOSIS — N1831 Chronic kidney disease, stage 3a: Secondary | ICD-10-CM | POA: Diagnosis not present

## 2023-05-08 DIAGNOSIS — I129 Hypertensive chronic kidney disease with stage 1 through stage 4 chronic kidney disease, or unspecified chronic kidney disease: Secondary | ICD-10-CM | POA: Diagnosis not present

## 2023-05-08 DIAGNOSIS — E1122 Type 2 diabetes mellitus with diabetic chronic kidney disease: Secondary | ICD-10-CM | POA: Diagnosis not present

## 2023-05-08 DIAGNOSIS — E1140 Type 2 diabetes mellitus with diabetic neuropathy, unspecified: Secondary | ICD-10-CM | POA: Diagnosis not present

## 2023-05-08 DIAGNOSIS — N179 Acute kidney failure, unspecified: Secondary | ICD-10-CM | POA: Diagnosis not present

## 2023-05-10 DIAGNOSIS — N179 Acute kidney failure, unspecified: Secondary | ICD-10-CM | POA: Diagnosis not present

## 2023-05-10 DIAGNOSIS — N136 Pyonephrosis: Secondary | ICD-10-CM | POA: Diagnosis not present

## 2023-05-10 DIAGNOSIS — N1831 Chronic kidney disease, stage 3a: Secondary | ICD-10-CM | POA: Diagnosis not present

## 2023-05-10 DIAGNOSIS — R2689 Other abnormalities of gait and mobility: Secondary | ICD-10-CM | POA: Diagnosis not present

## 2023-05-10 DIAGNOSIS — E1142 Type 2 diabetes mellitus with diabetic polyneuropathy: Secondary | ICD-10-CM | POA: Diagnosis not present

## 2023-05-10 DIAGNOSIS — E1122 Type 2 diabetes mellitus with diabetic chronic kidney disease: Secondary | ICD-10-CM | POA: Diagnosis not present

## 2023-05-10 DIAGNOSIS — E1140 Type 2 diabetes mellitus with diabetic neuropathy, unspecified: Secondary | ICD-10-CM | POA: Diagnosis not present

## 2023-05-10 DIAGNOSIS — R059 Cough, unspecified: Secondary | ICD-10-CM | POA: Diagnosis not present

## 2023-05-10 DIAGNOSIS — R634 Abnormal weight loss: Secondary | ICD-10-CM | POA: Diagnosis not present

## 2023-05-10 DIAGNOSIS — I129 Hypertensive chronic kidney disease with stage 1 through stage 4 chronic kidney disease, or unspecified chronic kidney disease: Secondary | ICD-10-CM | POA: Diagnosis not present

## 2023-05-10 DIAGNOSIS — G5711 Meralgia paresthetica, right lower limb: Secondary | ICD-10-CM | POA: Diagnosis not present

## 2023-05-10 DIAGNOSIS — R296 Repeated falls: Secondary | ICD-10-CM | POA: Diagnosis not present

## 2023-05-11 ENCOUNTER — Ambulatory Visit: Payer: Self-pay | Admitting: Licensed Clinical Social Worker

## 2023-05-11 NOTE — Patient Instructions (Signed)
Visit Information  Thank you for taking time to visit with me today. Please don't hesitate to contact me if I can be of assistance to you.   Following are the goals we discussed today:   Goals Addressed             This Visit's Progress    Care Coordination Activities       Care Coordination Interventions: Patient has assistance in the home and getting to his medical appointments but patient wants transportation so that he can go to chuch and get involved with church activities.  SW discussed with the POC [name] about access to [employer] and the SW mail the SACT Part A to the patient and [name] stated that she can fill out the paperwork and mail it in for the patient. The SW will send the PCP part B to complete. SW completed the SDOH and there were no other needs at this time. SW will follow up on 05/26/2023 at 10:30 am        Our next appointment is by telephone on 05/26/2023 at 10:30 am  Please call the care guide team at [phone] if you need to cancel or reschedule your appointment.   If you are experiencing a Mental Health or Behavioral Health Crisis or need someone to talk to, please call the Suicide and Crisis Lifeline: 988 go to [facility] [address] [place] [phone]) call 911  Patient verbalizes understanding of instructions and care plan provided today and agrees to view in MyChart. Active MyChart status and patient understanding of how to access instructions and care plan via MyChart confirmed with patient.     Jeanie Cooks, PhD [facility], [facility] Social Worker Direct Dial: [phone]  Fax: [phone]

## 2023-05-11 NOTE — Patient Outreach (Signed)
  Care Coordination   Initial Visit Note   05/11/2023 Name: Dustin Duffy MRN: [id number] DOB: April 21, 1955  [name] is a 68 y.o. year old male who sees Irena Reichmann, [place] for primary care. I spoke with  [name] and [name] by phone today.  What matters to the patients health and wellness today?  Transportation    Goals Addressed             This Visit's Progress    Care Coordination Activities       Care Coordination Interventions: Patient has assistance in the home and getting to his medical appointments but patient wants transportation so that he can go to chuch and get involved with church activities.  SW discussed with the POC [name] about access to [employer] and the SW mail the SACT Part A to the patient and [name] stated that she can fill out the paperwork and mail it in for the patient. The SW will send the PCP part B to complete. SW completed the SDOH and there were no other needs at this time. SW will follow up on 05/26/2023 at 10:30 am        SDOH assessments and interventions completed:  Yes  SDOH Interventions Today    Flowsheet Row Most Recent Value  SDOH Interventions   Food Insecurity Interventions Intervention Not Indicated  Housing Interventions Intervention Not Indicated  Transportation Interventions Intervention Not Indicated  Utilities Interventions Intervention Not Indicated  Social Connections Interventions Community Resources Provided  [Wabts transportation to church]        Care Coordination Interventions:  Yes, provided  Interventions Today    Flowsheet Row Most Recent Value  General Interventions   General Interventions Discussed/Reviewed General Interventions Discussed, [name] wants transportation so that he can go to [employer] and get involved with church activities.]        Follow up plan: Follow up call scheduled for 05/26/2023 at 10:30 am    Encounter Outcome:  Patient Visit Completed    Jeanie Cooks, PhD [facility], [facility] Social Worker Direct Dial: [phone]  Fax: [phone]

## 2023-05-15 DIAGNOSIS — E1122 Type 2 diabetes mellitus with diabetic chronic kidney disease: Secondary | ICD-10-CM | POA: Diagnosis not present

## 2023-05-15 DIAGNOSIS — E1140 Type 2 diabetes mellitus with diabetic neuropathy, unspecified: Secondary | ICD-10-CM | POA: Diagnosis not present

## 2023-05-15 DIAGNOSIS — N136 Pyonephrosis: Secondary | ICD-10-CM | POA: Diagnosis not present

## 2023-05-15 DIAGNOSIS — I129 Hypertensive chronic kidney disease with stage 1 through stage 4 chronic kidney disease, or unspecified chronic kidney disease: Secondary | ICD-10-CM | POA: Diagnosis not present

## 2023-05-15 DIAGNOSIS — N179 Acute kidney failure, unspecified: Secondary | ICD-10-CM | POA: Diagnosis not present

## 2023-05-15 DIAGNOSIS — N1831 Chronic kidney disease, stage 3a: Secondary | ICD-10-CM | POA: Diagnosis not present

## 2023-05-19 DIAGNOSIS — H4720 Unspecified optic atrophy: Secondary | ICD-10-CM | POA: Diagnosis not present

## 2023-05-19 DIAGNOSIS — J309 Allergic rhinitis, unspecified: Secondary | ICD-10-CM | POA: Diagnosis not present

## 2023-05-19 DIAGNOSIS — R296 Repeated falls: Secondary | ICD-10-CM | POA: Diagnosis not present

## 2023-05-19 DIAGNOSIS — E559 Vitamin D deficiency, unspecified: Secondary | ICD-10-CM | POA: Diagnosis not present

## 2023-05-19 DIAGNOSIS — R338 Other retention of urine: Secondary | ICD-10-CM | POA: Diagnosis not present

## 2023-05-19 DIAGNOSIS — E1122 Type 2 diabetes mellitus with diabetic chronic kidney disease: Secondary | ICD-10-CM | POA: Diagnosis not present

## 2023-05-19 DIAGNOSIS — Z7982 Long term (current) use of aspirin: Secondary | ICD-10-CM | POA: Diagnosis not present

## 2023-05-19 DIAGNOSIS — Z7984 Long term (current) use of oral hypoglycemic drugs: Secondary | ICD-10-CM | POA: Diagnosis not present

## 2023-05-19 DIAGNOSIS — E1140 Type 2 diabetes mellitus with diabetic neuropathy, unspecified: Secondary | ICD-10-CM | POA: Diagnosis not present

## 2023-05-19 DIAGNOSIS — E875 Hyperkalemia: Secondary | ICD-10-CM | POA: Diagnosis not present

## 2023-05-19 DIAGNOSIS — N1831 Chronic kidney disease, stage 3a: Secondary | ICD-10-CM | POA: Diagnosis not present

## 2023-05-19 DIAGNOSIS — I700 Atherosclerosis of aorta: Secondary | ICD-10-CM | POA: Diagnosis not present

## 2023-05-19 DIAGNOSIS — R2681 Unsteadiness on feet: Secondary | ICD-10-CM | POA: Diagnosis not present

## 2023-05-19 DIAGNOSIS — N179 Acute kidney failure, unspecified: Secondary | ICD-10-CM | POA: Diagnosis not present

## 2023-05-19 DIAGNOSIS — I951 Orthostatic hypotension: Secondary | ICD-10-CM | POA: Diagnosis not present

## 2023-05-19 DIAGNOSIS — I129 Hypertensive chronic kidney disease with stage 1 through stage 4 chronic kidney disease, or unspecified chronic kidney disease: Secondary | ICD-10-CM | POA: Diagnosis not present

## 2023-05-19 DIAGNOSIS — M47812 Spondylosis without myelopathy or radiculopathy, cervical region: Secondary | ICD-10-CM | POA: Diagnosis not present

## 2023-05-19 DIAGNOSIS — G5711 Meralgia paresthetica, right lower limb: Secondary | ICD-10-CM | POA: Diagnosis not present

## 2023-05-19 DIAGNOSIS — R531 Weakness: Secondary | ICD-10-CM | POA: Diagnosis not present

## 2023-05-19 DIAGNOSIS — N401 Enlarged prostate with lower urinary tract symptoms: Secondary | ICD-10-CM | POA: Diagnosis not present

## 2023-05-19 DIAGNOSIS — H548 Legal blindness, as defined in USA: Secondary | ICD-10-CM | POA: Diagnosis not present

## 2023-05-19 DIAGNOSIS — Z466 Encounter for fitting and adjustment of urinary device: Secondary | ICD-10-CM | POA: Diagnosis not present

## 2023-05-19 DIAGNOSIS — G8929 Other chronic pain: Secondary | ICD-10-CM | POA: Diagnosis not present

## 2023-05-19 DIAGNOSIS — E785 Hyperlipidemia, unspecified: Secondary | ICD-10-CM | POA: Diagnosis not present

## 2023-05-19 DIAGNOSIS — N136 Pyonephrosis: Secondary | ICD-10-CM | POA: Diagnosis not present

## 2023-05-22 DIAGNOSIS — N179 Acute kidney failure, unspecified: Secondary | ICD-10-CM | POA: Diagnosis not present

## 2023-05-22 DIAGNOSIS — N1831 Chronic kidney disease, stage 3a: Secondary | ICD-10-CM | POA: Diagnosis not present

## 2023-05-22 DIAGNOSIS — N136 Pyonephrosis: Secondary | ICD-10-CM | POA: Diagnosis not present

## 2023-05-22 DIAGNOSIS — E1165 Type 2 diabetes mellitus with hyperglycemia: Secondary | ICD-10-CM | POA: Diagnosis not present

## 2023-05-22 DIAGNOSIS — I129 Hypertensive chronic kidney disease with stage 1 through stage 4 chronic kidney disease, or unspecified chronic kidney disease: Secondary | ICD-10-CM | POA: Diagnosis not present

## 2023-05-22 DIAGNOSIS — E1140 Type 2 diabetes mellitus with diabetic neuropathy, unspecified: Secondary | ICD-10-CM | POA: Diagnosis not present

## 2023-05-22 DIAGNOSIS — E1122 Type 2 diabetes mellitus with diabetic chronic kidney disease: Secondary | ICD-10-CM | POA: Diagnosis not present

## 2023-05-23 DIAGNOSIS — N179 Acute kidney failure, unspecified: Secondary | ICD-10-CM | POA: Diagnosis not present

## 2023-05-23 DIAGNOSIS — E1140 Type 2 diabetes mellitus with diabetic neuropathy, unspecified: Secondary | ICD-10-CM | POA: Diagnosis not present

## 2023-05-23 DIAGNOSIS — N136 Pyonephrosis: Secondary | ICD-10-CM | POA: Diagnosis not present

## 2023-05-23 DIAGNOSIS — I129 Hypertensive chronic kidney disease with stage 1 through stage 4 chronic kidney disease, or unspecified chronic kidney disease: Secondary | ICD-10-CM | POA: Diagnosis not present

## 2023-05-23 DIAGNOSIS — N1831 Chronic kidney disease, stage 3a: Secondary | ICD-10-CM | POA: Diagnosis not present

## 2023-05-23 DIAGNOSIS — E1122 Type 2 diabetes mellitus with diabetic chronic kidney disease: Secondary | ICD-10-CM | POA: Diagnosis not present

## 2023-05-24 DIAGNOSIS — N1831 Chronic kidney disease, stage 3a: Secondary | ICD-10-CM | POA: Diagnosis not present

## 2023-05-24 DIAGNOSIS — N179 Acute kidney failure, unspecified: Secondary | ICD-10-CM | POA: Diagnosis not present

## 2023-05-24 DIAGNOSIS — E1122 Type 2 diabetes mellitus with diabetic chronic kidney disease: Secondary | ICD-10-CM | POA: Diagnosis not present

## 2023-05-24 DIAGNOSIS — N136 Pyonephrosis: Secondary | ICD-10-CM | POA: Diagnosis not present

## 2023-05-24 DIAGNOSIS — E1140 Type 2 diabetes mellitus with diabetic neuropathy, unspecified: Secondary | ICD-10-CM | POA: Diagnosis not present

## 2023-05-24 DIAGNOSIS — I129 Hypertensive chronic kidney disease with stage 1 through stage 4 chronic kidney disease, or unspecified chronic kidney disease: Secondary | ICD-10-CM | POA: Diagnosis not present

## 2023-05-26 ENCOUNTER — Ambulatory Visit: Payer: Self-pay | Admitting: Licensed Clinical Social Worker

## 2023-05-26 NOTE — Patient Instructions (Signed)
 Visit Information  Thank you for taking time to visit with me today. Please don't hesitate to contact me if I can be of assistance to you.   Following are the goals we discussed today:   Goals Addressed             This Visit's Progress    Care Coordination Activities   On track    Care Coordination Interventions: Patient has assistance in the home and getting to his medical appointments but patient wants transportation so that he can go to chuch and get involved with church activities.  SW discussed with the POC [name] about access to [employer] and the SW mail the SACT Part A to the patient and [name] stated that she can fill out the paperwork and mail it in for the patient. The SW will send the PCP part B to complete. SW completed the SDOH and there were no other needs at this time. SW will follow up on 06/12/2023 at 10:30 am        Our next appointment is by telephone on 06/12/2023 at 10:30 am  Please call the care guide team at [phone] if you need to cancel or reschedule your appointment.   If you are experiencing a Mental Health or Behavioral Health Crisis or need someone to talk to, please call the Suicide and Crisis Lifeline: 988 go to [facility] [address] [place] [phone]) call 911  Patient verbalizes understanding of instructions and care plan provided today and agrees to view in MyChart. Active MyChart status and patient understanding of how to access instructions and care plan via MyChart confirmed with patient.     Jeanie Cooks, PhD [facility], [facility] Social Worker Direct Dial: [phone]  Fax: [phone]

## 2023-05-26 NOTE — Patient Outreach (Signed)
 Care Coordination   Follow Up Visit Note   05/26/2023 Name: Dustin Duffy MRN: [id number] DOB: Aug 11, 1955  Dustin Duffy is a 68 y.o. year old male who sees Irena Reichmann, [place] for primary care. I spoke with  [name] friend [name]) by phone today.  What matters to the patients health and wellness today?  SCAT application     Goals Addressed             This Visit's Progress    Care Coordination Activities   On track    Care Coordination Interventions: Patient has assistance in the home and getting to his medical appointments but patient wants transportation so that he can go to chuch and get involved with church activities.  SW discussed with the POC [name] about access to [employer] and the SW mail the SACT Part A to the patient and [name] stated that she can fill out the paperwork and mail it in for the patient. The SW will send the PCP part B to complete. SW completed the SDOH and there were no other needs at this time. SW will follow up on 06/12/2023 at 10:30 am        SDOH assessments and interventions completed:  Yes  SDOH Interventions Today    Flowsheet Row Most Recent Value  SDOH Interventions   Food Insecurity Interventions Intervention Not Indicated  Housing Interventions Intervention Not Indicated  Transportation Interventions SCAT ([employer])  [Application pending]  Utilities Interventions Intervention Not Indicated        Care Coordination Interventions:  Yes, provided  Interventions Today    Flowsheet Row Most Recent Value  General Interventions   General Interventions Discussed/Reviewed General Interventions Reviewed, Community Resources  [Family has not completed the Part A but will complete it and mail to SCAT]        Follow up plan: Follow up call scheduled for 3//17/2025 at 10:30 am    Encounter Outcome:  Patient Visit Completed   Jeanie Cooks, PhD [facility], [facility] Social Worker Direct Dial: [phone]  Fax: [phone]

## 2023-05-31 DIAGNOSIS — E1140 Type 2 diabetes mellitus with diabetic neuropathy, unspecified: Secondary | ICD-10-CM | POA: Diagnosis not present

## 2023-05-31 DIAGNOSIS — N1831 Chronic kidney disease, stage 3a: Secondary | ICD-10-CM | POA: Diagnosis not present

## 2023-05-31 DIAGNOSIS — N179 Acute kidney failure, unspecified: Secondary | ICD-10-CM | POA: Diagnosis not present

## 2023-05-31 DIAGNOSIS — E1122 Type 2 diabetes mellitus with diabetic chronic kidney disease: Secondary | ICD-10-CM | POA: Diagnosis not present

## 2023-05-31 DIAGNOSIS — I129 Hypertensive chronic kidney disease with stage 1 through stage 4 chronic kidney disease, or unspecified chronic kidney disease: Secondary | ICD-10-CM | POA: Diagnosis not present

## 2023-05-31 DIAGNOSIS — N136 Pyonephrosis: Secondary | ICD-10-CM | POA: Diagnosis not present

## 2023-06-06 DIAGNOSIS — E1140 Type 2 diabetes mellitus with diabetic neuropathy, unspecified: Secondary | ICD-10-CM | POA: Diagnosis not present

## 2023-06-06 DIAGNOSIS — I129 Hypertensive chronic kidney disease with stage 1 through stage 4 chronic kidney disease, or unspecified chronic kidney disease: Secondary | ICD-10-CM | POA: Diagnosis not present

## 2023-06-06 DIAGNOSIS — N136 Pyonephrosis: Secondary | ICD-10-CM | POA: Diagnosis not present

## 2023-06-06 DIAGNOSIS — N1831 Chronic kidney disease, stage 3a: Secondary | ICD-10-CM | POA: Diagnosis not present

## 2023-06-06 DIAGNOSIS — N179 Acute kidney failure, unspecified: Secondary | ICD-10-CM | POA: Diagnosis not present

## 2023-06-06 DIAGNOSIS — E1122 Type 2 diabetes mellitus with diabetic chronic kidney disease: Secondary | ICD-10-CM | POA: Diagnosis not present

## 2023-06-07 DIAGNOSIS — E1140 Type 2 diabetes mellitus with diabetic neuropathy, unspecified: Secondary | ICD-10-CM | POA: Diagnosis not present

## 2023-06-07 DIAGNOSIS — E1122 Type 2 diabetes mellitus with diabetic chronic kidney disease: Secondary | ICD-10-CM | POA: Diagnosis not present

## 2023-06-07 DIAGNOSIS — N136 Pyonephrosis: Secondary | ICD-10-CM | POA: Diagnosis not present

## 2023-06-07 DIAGNOSIS — N1831 Chronic kidney disease, stage 3a: Secondary | ICD-10-CM | POA: Diagnosis not present

## 2023-06-07 DIAGNOSIS — N179 Acute kidney failure, unspecified: Secondary | ICD-10-CM | POA: Diagnosis not present

## 2023-06-07 DIAGNOSIS — I129 Hypertensive chronic kidney disease with stage 1 through stage 4 chronic kidney disease, or unspecified chronic kidney disease: Secondary | ICD-10-CM | POA: Diagnosis not present

## 2023-06-12 ENCOUNTER — Ambulatory Visit: Payer: Self-pay | Admitting: Licensed Clinical Social Worker

## 2023-06-12 DIAGNOSIS — G8929 Other chronic pain: Secondary | ICD-10-CM | POA: Diagnosis not present

## 2023-06-12 DIAGNOSIS — M7631 Iliotibial band syndrome, right leg: Secondary | ICD-10-CM | POA: Diagnosis not present

## 2023-06-12 DIAGNOSIS — G5711 Meralgia paresthetica, right lower limb: Secondary | ICD-10-CM | POA: Diagnosis not present

## 2023-06-12 DIAGNOSIS — M79651 Pain in right thigh: Secondary | ICD-10-CM | POA: Diagnosis not present

## 2023-06-12 DIAGNOSIS — M7061 Trochanteric bursitis, right hip: Secondary | ICD-10-CM | POA: Diagnosis not present

## 2023-06-12 NOTE — Patient Outreach (Signed)
 Care Coordination   Follow Up Visit Note   06/12/2023 Name: Dustin Duffy MRN: [id number] DOB: 1955-05-28  Dustin Duffy is a 68 y.o. year old male who sees Irena Reichmann, [place] for primary care. I spoke with  [name] by phone today.  What matters to the patients health and wellness today?  SCAT application     Goals Addressed             This Visit's Progress    Care Coordination Activities       Care Coordination Interventions: Patient has assistance in the home and getting to his medical appointments but patient wants transportation so that he can go to chuch and get involved with church activities.  SW discussed with the POC [name] about access to [employer] and the SW mail the SACT Part A to the patient and [name] stated that she can fill out the paperwork and mail it in for the patient. The SW will send the PCP part B to complete. SW completed the SDOH and there were no other needs at this time. SW will follow up on 06/26/2023 at 9:30 am        SDOH assessments and interventions completed:  Yes     Care Coordination Interventions:  Yes, provided  Interventions Today    Flowsheet Row Most Recent Value  General Interventions   General Interventions Discussed/Reviewed General Interventions Reviewed  [Patient heard back form SCAT and some places on the application needed to be fixed, SW will check with SCAT and PCP to see if Part B has been completed and submitted.]        Follow up plan: Follow up call scheduled for 06/26/2023  at 9:30 am  Encounter Outcome:  Patient Visit Completed   Jeanie Cooks, PhD [facility], [facility] Social Worker Direct Dial: [phone]  Fax: [phone]

## 2023-06-12 NOTE — Patient Outreach (Signed)
 Care Coordination   06/12/2023 Name: Dustin Duffy MRN: [id number] DOB: October 05, 1955   Care Coordination Outreach Attempts:  An unsuccessful outreach was attempted for an appointment today.  Follow Up Plan:  Additional outreach attempts will be made to offer the patient complex care management information and services.   Encounter Outcome:  No Answer   Care Coordination Interventions:  No, not indicated   SW will attempt another follow up call on 06/26/2023 at 9:30 am  .Jeanie Cooks, PhD [facility], [facility] Social Worker Direct Dial: [phone]  Fax: [phone]

## 2023-06-14 DIAGNOSIS — N1831 Chronic kidney disease, stage 3a: Secondary | ICD-10-CM | POA: Diagnosis not present

## 2023-06-14 DIAGNOSIS — N136 Pyonephrosis: Secondary | ICD-10-CM | POA: Diagnosis not present

## 2023-06-14 DIAGNOSIS — N179 Acute kidney failure, unspecified: Secondary | ICD-10-CM | POA: Diagnosis not present

## 2023-06-14 DIAGNOSIS — E1140 Type 2 diabetes mellitus with diabetic neuropathy, unspecified: Secondary | ICD-10-CM | POA: Diagnosis not present

## 2023-06-14 DIAGNOSIS — E1122 Type 2 diabetes mellitus with diabetic chronic kidney disease: Secondary | ICD-10-CM | POA: Diagnosis not present

## 2023-06-14 DIAGNOSIS — I129 Hypertensive chronic kidney disease with stage 1 through stage 4 chronic kidney disease, or unspecified chronic kidney disease: Secondary | ICD-10-CM | POA: Diagnosis not present

## 2023-06-20 ENCOUNTER — Inpatient Hospital Stay (HOSPITAL_COMMUNITY)
Admission: EM | Admit: 2023-06-20 | Discharge: 2023-06-27 | DRG: 981 | Disposition: A | Discharge status: Skilled Nursing Facility | Attending: Internal Medicine | Admitting: Internal Medicine

## 2023-06-20 ENCOUNTER — Other Ambulatory Visit: Payer: Self-pay

## 2023-06-20 ENCOUNTER — Observation Stay (HOSPITAL_COMMUNITY)

## 2023-06-20 ENCOUNTER — Emergency Department (HOSPITAL_COMMUNITY)

## 2023-06-20 DIAGNOSIS — Y9301 Activity, walking, marching and hiking: Secondary | ICD-10-CM | POA: Diagnosis present

## 2023-06-20 DIAGNOSIS — N390 Urinary tract infection, site not specified: Secondary | ICD-10-CM | POA: Diagnosis present

## 2023-06-20 DIAGNOSIS — R296 Repeated falls: Secondary | ICD-10-CM | POA: Diagnosis present

## 2023-06-20 DIAGNOSIS — Z7984 Long term (current) use of oral hypoglycemic drugs: Secondary | ICD-10-CM

## 2023-06-20 DIAGNOSIS — J9601 Acute respiratory failure with hypoxia: Secondary | ICD-10-CM | POA: Diagnosis not present

## 2023-06-20 DIAGNOSIS — S42292D Other displaced fracture of upper end of left humerus, subsequent encounter for fracture with routine healing: Secondary | ICD-10-CM | POA: Diagnosis not present

## 2023-06-20 DIAGNOSIS — Z9889 Other specified postprocedural states: Secondary | ICD-10-CM | POA: Diagnosis not present

## 2023-06-20 DIAGNOSIS — S8002XA Contusion of left knee, initial encounter: Secondary | ICD-10-CM | POA: Diagnosis present

## 2023-06-20 DIAGNOSIS — S42295A Other nondisplaced fracture of upper end of left humerus, initial encounter for closed fracture: Secondary | ICD-10-CM | POA: Diagnosis not present

## 2023-06-20 DIAGNOSIS — Z87891 Personal history of nicotine dependence: Secondary | ICD-10-CM

## 2023-06-20 DIAGNOSIS — E1143 Type 2 diabetes mellitus with diabetic autonomic (poly)neuropathy: Secondary | ICD-10-CM | POA: Diagnosis present

## 2023-06-20 DIAGNOSIS — A419 Sepsis, unspecified organism: Secondary | ICD-10-CM | POA: Diagnosis present

## 2023-06-20 DIAGNOSIS — W19XXXA Unspecified fall, initial encounter: Secondary | ICD-10-CM | POA: Diagnosis not present

## 2023-06-20 DIAGNOSIS — R319 Hematuria, unspecified: Secondary | ICD-10-CM | POA: Diagnosis present

## 2023-06-20 DIAGNOSIS — S42202A Unspecified fracture of upper end of left humerus, initial encounter for closed fracture: Secondary | ICD-10-CM | POA: Diagnosis not present

## 2023-06-20 DIAGNOSIS — R41841 Cognitive communication deficit: Secondary | ICD-10-CM | POA: Diagnosis not present

## 2023-06-20 DIAGNOSIS — R64 Cachexia: Secondary | ICD-10-CM | POA: Diagnosis present

## 2023-06-20 DIAGNOSIS — R531 Weakness: Secondary | ICD-10-CM

## 2023-06-20 DIAGNOSIS — E876 Hypokalemia: Secondary | ICD-10-CM | POA: Diagnosis not present

## 2023-06-20 DIAGNOSIS — R2689 Other abnormalities of gait and mobility: Secondary | ICD-10-CM | POA: Diagnosis not present

## 2023-06-20 DIAGNOSIS — N138 Other obstructive and reflux uropathy: Secondary | ICD-10-CM | POA: Diagnosis present

## 2023-06-20 DIAGNOSIS — J9811 Atelectasis: Secondary | ICD-10-CM | POA: Diagnosis not present

## 2023-06-20 DIAGNOSIS — E1122 Type 2 diabetes mellitus with diabetic chronic kidney disease: Secondary | ICD-10-CM | POA: Diagnosis not present

## 2023-06-20 DIAGNOSIS — N182 Chronic kidney disease, stage 2 (mild): Secondary | ICD-10-CM | POA: Diagnosis present

## 2023-06-20 DIAGNOSIS — N401 Enlarged prostate with lower urinary tract symptoms: Secondary | ICD-10-CM | POA: Diagnosis not present

## 2023-06-20 DIAGNOSIS — R636 Underweight: Secondary | ICD-10-CM | POA: Diagnosis present

## 2023-06-20 DIAGNOSIS — H547 Unspecified visual loss: Secondary | ICD-10-CM | POA: Diagnosis present

## 2023-06-20 DIAGNOSIS — W010XXA Fall on same level from slipping, tripping and stumbling without subsequent striking against object, initial encounter: Secondary | ICD-10-CM | POA: Diagnosis present

## 2023-06-20 DIAGNOSIS — D631 Anemia in chronic kidney disease: Secondary | ICD-10-CM | POA: Diagnosis present

## 2023-06-20 DIAGNOSIS — Z7982 Long term (current) use of aspirin: Secondary | ICD-10-CM

## 2023-06-20 DIAGNOSIS — I9589 Other hypotension: Secondary | ICD-10-CM | POA: Diagnosis present

## 2023-06-20 DIAGNOSIS — I452 Bifascicular block: Secondary | ICD-10-CM | POA: Diagnosis present

## 2023-06-20 DIAGNOSIS — I129 Hypertensive chronic kidney disease with stage 1 through stage 4 chronic kidney disease, or unspecified chronic kidney disease: Secondary | ICD-10-CM | POA: Diagnosis present

## 2023-06-20 DIAGNOSIS — Z681 Body mass index (BMI) 19 or less, adult: Secondary | ICD-10-CM

## 2023-06-20 DIAGNOSIS — J432 Centrilobular emphysema: Secondary | ICD-10-CM | POA: Diagnosis not present

## 2023-06-20 DIAGNOSIS — S42292A Other displaced fracture of upper end of left humerus, initial encounter for closed fracture: Secondary | ICD-10-CM | POA: Diagnosis present

## 2023-06-20 DIAGNOSIS — Z7401 Bed confinement status: Secondary | ICD-10-CM | POA: Diagnosis not present

## 2023-06-20 DIAGNOSIS — J952 Acute pulmonary insufficiency following nonthoracic surgery: Secondary | ICD-10-CM | POA: Diagnosis not present

## 2023-06-20 DIAGNOSIS — Z602 Problems related to living alone: Secondary | ICD-10-CM | POA: Diagnosis present

## 2023-06-20 DIAGNOSIS — S8001XA Contusion of right knee, initial encounter: Secondary | ICD-10-CM | POA: Diagnosis present

## 2023-06-20 DIAGNOSIS — Z604 Social exclusion and rejection: Secondary | ICD-10-CM | POA: Diagnosis present

## 2023-06-20 DIAGNOSIS — R739 Hyperglycemia, unspecified: Secondary | ICD-10-CM | POA: Diagnosis not present

## 2023-06-20 DIAGNOSIS — M6281 Muscle weakness (generalized): Secondary | ICD-10-CM | POA: Diagnosis not present

## 2023-06-20 DIAGNOSIS — M25512 Pain in left shoulder: Secondary | ICD-10-CM | POA: Diagnosis not present

## 2023-06-20 DIAGNOSIS — J99 Respiratory disorders in diseases classified elsewhere: Secondary | ICD-10-CM | POA: Diagnosis not present

## 2023-06-20 DIAGNOSIS — R278 Other lack of coordination: Secondary | ICD-10-CM | POA: Diagnosis not present

## 2023-06-20 DIAGNOSIS — R0902 Hypoxemia: Secondary | ICD-10-CM | POA: Diagnosis not present

## 2023-06-20 DIAGNOSIS — E872 Acidosis, unspecified: Secondary | ICD-10-CM | POA: Diagnosis present

## 2023-06-20 DIAGNOSIS — E119 Type 2 diabetes mellitus without complications: Secondary | ICD-10-CM | POA: Diagnosis not present

## 2023-06-20 DIAGNOSIS — Z87440 Personal history of urinary (tract) infections: Secondary | ICD-10-CM

## 2023-06-20 DIAGNOSIS — Z5982 Transportation insecurity: Secondary | ICD-10-CM

## 2023-06-20 DIAGNOSIS — E785 Hyperlipidemia, unspecified: Secondary | ICD-10-CM | POA: Diagnosis present

## 2023-06-20 DIAGNOSIS — T83511A Infection and inflammatory reaction due to indwelling urethral catheter, initial encounter: Principal | ICD-10-CM | POA: Diagnosis present

## 2023-06-20 DIAGNOSIS — Z79899 Other long term (current) drug therapy: Secondary | ICD-10-CM

## 2023-06-20 DIAGNOSIS — T8484XD Pain due to internal orthopedic prosthetic devices, implants and grafts, subsequent encounter: Secondary | ICD-10-CM | POA: Diagnosis not present

## 2023-06-20 DIAGNOSIS — J984 Other disorders of lung: Secondary | ICD-10-CM | POA: Diagnosis not present

## 2023-06-20 DIAGNOSIS — Y846 Urinary catheterization as the cause of abnormal reaction of the patient, or of later complication, without mention of misadventure at the time of the procedure: Secondary | ICD-10-CM | POA: Diagnosis present

## 2023-06-20 DIAGNOSIS — Z7952 Long term (current) use of systemic steroids: Secondary | ICD-10-CM

## 2023-06-20 DIAGNOSIS — I2510 Atherosclerotic heart disease of native coronary artery without angina pectoris: Secondary | ICD-10-CM | POA: Diagnosis not present

## 2023-06-20 DIAGNOSIS — S20219A Contusion of unspecified front wall of thorax, initial encounter: Secondary | ICD-10-CM | POA: Diagnosis not present

## 2023-06-20 DIAGNOSIS — S301XXA Contusion of abdominal wall, initial encounter: Secondary | ICD-10-CM | POA: Diagnosis present

## 2023-06-20 DIAGNOSIS — I10 Essential (primary) hypertension: Secondary | ICD-10-CM | POA: Diagnosis not present

## 2023-06-20 DIAGNOSIS — G8918 Other acute postprocedural pain: Secondary | ICD-10-CM | POA: Diagnosis not present

## 2023-06-20 LAB — LACTIC ACID, PLASMA
Lactic Acid, Venous: >9 mmol/L (ref 0.5–1.9)
Lactic Acid, Venous: 1.1 mmol/L (ref 0.5–1.9)

## 2023-06-20 LAB — URINALYSIS, W/ REFLEX TO CULTURE (INFECTION SUSPECTED)
Bilirubin Urine: NEGATIVE
Glucose, UA: >=500 mg/dL — AB
Ketones, ur: NEGATIVE mg/dL
Nitrite: NEGATIVE
Protein, ur: 100 mg/dL — AB
Specific Gravity, Urine: 1.013 (ref 1.005–1.030)
WBC, UA: >50 WBC/hpf (ref 0–5)
pH: 5 (ref 5.0–8.0)

## 2023-06-20 LAB — CBC WITH DIFFERENTIAL/PLATELET
Abs Immature Granulocytes: 0.01 10*3/uL (ref 0.00–0.07)
Basophils Absolute: 0.1 10*3/uL (ref 0.0–0.1)
Basophils Relative: 1 %
Eosinophils Absolute: 0 10*3/uL (ref 0.0–0.5)
Eosinophils Relative: 0 %
HCT: 42.9 % (ref 39.0–52.0)
Hemoglobin: 12.9 g/dL — ABNORMAL LOW (ref 13.0–17.0)
Immature Granulocytes: 0 %
Lymphocytes Relative: 8 %
Lymphs Abs: 0.7 10*3/uL (ref 0.7–4.0)
MCH: 26.1 pg (ref 26.0–34.0)
MCHC: 30.1 g/dL (ref 30.0–36.0)
MCV: 86.8 fL (ref 80.0–100.0)
Monocytes Absolute: 0.8 10*3/uL (ref 0.1–1.0)
Monocytes Relative: 9 %
Neutro Abs: 7.2 10*3/uL (ref 1.7–7.7)
Neutrophils Relative %: 82 %
Platelets: 234 10*3/uL (ref 150–400)
RBC: 4.94 MIL/uL (ref 4.22–5.81)
RDW: 15.3 % (ref 11.5–15.5)
WBC: 8.9 10*3/uL (ref 4.0–10.5)
nRBC: 0 % (ref 0.0–0.2)

## 2023-06-20 LAB — BASIC METABOLIC PANEL
Anion gap: 8 (ref 5–15)
BUN: 20 mg/dL (ref 8–23)
CO2: 32 mmol/L (ref 22–32)
Calcium: 9 mg/dL (ref 8.9–10.3)
Chloride: 101 mmol/L (ref 98–111)
Creatinine, Ser: 1.18 mg/dL (ref 0.61–1.24)
GFR, Estimated: >60 mL/min (ref >60)
Glucose, Bld: 181 mg/dL — ABNORMAL HIGH (ref 70–99)
Potassium: 4 mmol/L (ref 3.5–5.1)
Sodium: 141 mmol/L (ref 135–145)

## 2023-06-20 LAB — CBG MONITORING, ED
Glucose-Capillary: 106 mg/dL — ABNORMAL HIGH (ref 70–99)
Glucose-Capillary: 145 mg/dL — ABNORMAL HIGH (ref 70–99)

## 2023-06-20 LAB — HIV ANTIBODY (ROUTINE TESTING W REFLEX): HIV Screen 4th Generation wRfx: NONREACTIVE

## 2023-06-20 MED ORDER — MIRABEGRON ER 25 MG PO TB24
50.0000 mg | ORAL_TABLET | Freq: Every day | ORAL | Status: DC
  Administered 2023-06-21 – 2023-06-27 (×6): 50 mg via ORAL
  Filled 2023-06-20 (×7): qty 2

## 2023-06-20 MED ORDER — OXYCODONE HCL 5 MG PO TABS
5.0000 mg | ORAL_TABLET | ORAL | Status: DC | PRN
  Administered 2023-06-21 – 2023-06-22 (×4): 5 mg via ORAL
  Filled 2023-06-20 (×4): qty 1

## 2023-06-20 MED ORDER — IOHEXOL 300 MG/ML  SOLN
100.0000 mL | Freq: Once | INTRAMUSCULAR | Status: AC | PRN
  Administered 2023-06-20: 100 mL via INTRAVENOUS

## 2023-06-20 MED ORDER — SENNOSIDES-DOCUSATE SODIUM 8.6-50 MG PO TABS: 1.0000 | ORAL_TABLET | Freq: Every evening | ORAL | Status: DC | PRN

## 2023-06-20 MED ORDER — LACTATED RINGERS IV SOLN: INTRAVENOUS | Status: AC

## 2023-06-20 MED ORDER — LACTATED RINGERS IV BOLUS
1000.0000 mL | Freq: Once | INTRAVENOUS | Status: AC
  Administered 2023-06-20: 1000 mL via INTRAVENOUS

## 2023-06-20 MED ORDER — MORPHINE SULFATE (PF) 4 MG/ML IV SOLN
4.0000 mg | Freq: Once | INTRAVENOUS | Status: AC
  Administered 2023-06-20: 4 mg via INTRAVENOUS
  Filled 2023-06-20: qty 1

## 2023-06-20 MED ORDER — ACETAMINOPHEN 325 MG PO TABS: 650.0000 mg | ORAL_TABLET | Freq: Four times a day (QID) | ORAL | Status: DC | PRN

## 2023-06-20 MED ORDER — ONDANSETRON HCL 4 MG/2ML IJ SOLN
4.0000 mg | Freq: Once | INTRAMUSCULAR | Status: AC
  Administered 2023-06-20: 4 mg via INTRAVENOUS
  Filled 2023-06-20: qty 2

## 2023-06-20 MED ORDER — SIMVASTATIN 20 MG PO TABS
20.0000 mg | ORAL_TABLET | Freq: Every day | ORAL | Status: DC
  Administered 2023-06-20 – 2023-06-26 (×7): 20 mg via ORAL
  Filled 2023-06-20 (×7): qty 1

## 2023-06-20 MED ORDER — ACETAMINOPHEN 650 MG RE SUPP: 650.0000 mg | Freq: Four times a day (QID) | RECTAL | Status: DC | PRN

## 2023-06-20 MED ORDER — HYDROMORPHONE HCL 1 MG/ML IJ SOLN: 1.0000 mg | INTRAMUSCULAR | Status: DC | PRN

## 2023-06-20 MED ORDER — SODIUM CHLORIDE 0.9 % IV SOLN
1.0000 g | INTRAVENOUS | Status: DC
  Administered 2023-06-21 – 2023-06-25 (×5): 1 g via INTRAVENOUS
  Filled 2023-06-20 (×8): qty 10

## 2023-06-20 MED ORDER — SODIUM CHLORIDE 0.9 % IV SOLN
1.0000 g | Freq: Once | INTRAVENOUS | Status: AC
  Administered 2023-06-20: 1 g via INTRAVENOUS
  Filled 2023-06-20: qty 10

## 2023-06-20 MED ORDER — FLUCONAZOLE 100 MG PO TABS
200.0000 mg | ORAL_TABLET | Freq: Every day | ORAL | Status: DC
Start: 2023-06-20 — End: 2023-06-26
  Administered 2023-06-20 – 2023-06-25 (×6): 200 mg via ORAL
  Filled 2023-06-20 (×4): qty 2
  Filled 2023-06-20: qty 1
  Filled 2023-06-20: qty 2

## 2023-06-20 MED ORDER — ENOXAPARIN SODIUM 40 MG/0.4ML IJ SOSY: 40.0000 mg | PREFILLED_SYRINGE | INTRAMUSCULAR | Status: DC

## 2023-06-20 MED ORDER — METOPROLOL TARTRATE 5 MG/5ML IV SOLN
5.0000 mg | Freq: Once | INTRAVENOUS | Status: AC
  Administered 2023-06-20: 5 mg via INTRAVENOUS
  Filled 2023-06-20: qty 5

## 2023-06-20 NOTE — Progress Notes (Signed)
 Discussed care with EDP and reviewed, films.  OK for sling and office follow up this week.

## 2023-06-20 NOTE — H&P (Addendum)
 PREOPERATIVE H&P  Chief Complaint: Left shoulder pain  HPI: Dustin Duffy is a 68 y.o. male with history of BPH, diabetes, retention, visual impairment who was a long emergency department with left shoulder pain after a fall.  He states he was walking in his home earlier this morning and tripped falling into the couch.  He was unable to get up and had severe left shoulder pain. At this time pain is moderate, worse with movement and better with rest and pain medication. Denies paraesthesias. Denies chest pain, shortness of breath, nausea, or vomiting. Denies pain elsewhere. Does not walk with assistive device. He does not take a blood thinner.  Past Medical History:  Diagnosis Date   BPH without urinary obstruction    Diabetes mellitus type 2 in nonobese [facility])    Dyslipidemia    Essential hypertension    Renal impairment    Visual impairment    No past surgical history on file. Social History   Socioeconomic History   Marital status: Divorced    Spouse name: Not on file   Number of children: Not on file   Years of education: Not on file   Highest education level: Not on file  Occupational History   Occupation: disabled  Tobacco Use   Smoking status: Former    Current packs/day: 0.00    Average packs/day: 1 pack/day for 35.0 years (35.0 ttl pk-yrs)    Types: Cigarettes    Start date: 31    Quit date: 2012    Years since quitting: 13.2   Smokeless tobacco: Never  Substance and Sexual Activity   Alcohol use: Not on file    Comment: heavy use, quit in 2008   Drug use: Not Currently   Sexual activity: Not on file  Other Topics Concern   Not on file  Social History Narrative   Not on file   Social Drivers of Health   Financial Resource Strain: Not on file  Food Insecurity: No Food Insecurity (05/26/2023)   Hunger Vital Sign    Worried About Running Out of Food in the Last Year: Never true    Ran Out of Food in the Last Year: Never true  Transportation Needs: Unmet  Transportation Needs (05/26/2023)   PRAPARE - Administrator, Civil Service (Medical): Yes    Lack of Transportation (Non-Medical): Yes  Physical Activity: Not on file  Stress: Not on file  Social Connections: Socially Isolated (05/11/2023)   Social Connection and Isolation Panel [NHANES]    Frequency of Communication with Friends and Family: More than three times a week    Frequency of Social Gatherings with Friends and Family: Once a week    Attends Religious Services: Never    Database administrator or Organizations: No    Attends Banker Meetings: Never    Marital Status: Divorced   No family history on file. No Known Allergies Prior to Admission medications   Medication Sig Start Date End Date Taking? Authorizing Provider  acetaminophen (TYLENOL) 325 MG tablet Take 2 tablets (650 mg total) by mouth every 6 (six) hours as needed for mild pain (pain score 1-3) (or Fever >/= 101). 04/15/23   [name], MD  amitriptyline (ELAVIL) 50 MG tablet Take 50 mg by mouth at bedtime.    [provider]  aspirin EC 81 MG tablet Take 81 mg by mouth in the morning. Swallow whole.    [provider]  cholecalciferol (VITAMIN D3) 25 MCG (  1000 UNIT) tablet Take 1,000 Units by mouth every Monday, Wednesday, and Friday.    [provider]  fludrocortisone (FLORINEF) 0.1 MG tablet Take 1 tablet (0.1 mg total) by mouth daily. 06/09/22   Burnadette Pop, MD  gabapentin (NEURONTIN) 300 MG capsule Take 900 mg by mouth at bedtime. 02/05/21   [provider]  ipratropium (ATROVENT) 0.03 % nasal spray Place 2 sprays into both nostrils 2 (two) times daily as needed for rhinitis. Patient not taking: Reported on 04/17/2023    [provider]  midodrine (PROAMATINE) 5 MG tablet Take 1 tablet (5 mg total) by mouth 3 (three) times daily with meals. 06/09/22   Burnadette Pop, MD  MYRBETRIQ 50 MG TB24 tablet Take 50 mg by mouth in the morning.     [provider]  simvastatin (ZOCOR) 20 MG tablet Take 1 tablet (20 mg total) by mouth every evening. Do not take this medication until you finish Diflucan. Patient taking differently: Take 20 mg by mouth at bedtime. 05/02/22   Standley Brooking, MD  XIGDUO XR 10-500 MG TB24 Take 1 tablet by mouth daily before breakfast.    [provider]     Positive ROS: All other systems have been reviewed and were otherwise negative with the exception of those mentioned in the HPI and as above.  Physical Exam: General: Alert, pleasant, in no acute distress Cardiovascular: No pedal edema Respiratory: No cyanosis, no use of accessory musculature GI: No organomegaly, abdomen is soft and non-tender Skin: No lesions in the area of chief complaint Neurologic: Sensation intact distally Psychiatric: Patient is competent for consent with normal mood and affect Lymphatic: No axillary or cervical lymphadenopathy  MUSCULOSKELETAL: Ecchymotic left shoulder. No TTP to left elbow, wrist, or fingers. All fingers flex, extend and abduct. Sensation intact to all fingers. 2+ radial pulse.   Imaging: X-rays of left shoulder shows displaced left proximal humerus fracture  Assessment: Displaced left proximal humerus fracture   Plan: CT ordered for surgical planning.   Discussed plan with patient at bedside today. Operative vs. Non-operative treatment have been discussed and patient would like to move forward with definitive surgical management. Since patient is being admitted for acute UTI, we will likely be able to perform his surgery while he is inpatient. I will discuss his case with our shoulder specialists tomorrow and update patient with surgical plan based off of surgeon/OR availability.  In the meantime, continue left shoulder sling for comfort. Non-weight bearing LUE.    Armida Sans, PA-C    06/20/2023 5:32 PM

## 2023-06-20 NOTE — ED Triage Notes (Signed)
 Pt BIBA from home. C/o L shoulder pain w/deformity following a mech fall at 0800 today.  No head trauma, not on blood thinners.  AOx4

## 2023-06-20 NOTE — ED Provider Notes (Signed)
 [place] EMERGENCY DEPARTMENT AT [facility] Provider Note   CSN: [id number] Arrival date & time: 06/20/23  1155     History  Chief Complaint  Patient presents with   Fall   Shoulder Injury    Dustin Duffy is a 68 y.o. male presents today for left shoulder pain after mechanical fall today at 8 AM.  Patient denies head trauma, pain anywhere else, loss of consciousness, blood thinner use, weakness, or numbness.  Patient is ANO x 4. Pt is left hand dominant and lives alone.    Fall  Shoulder Injury       Home Medications Prior to Admission medications   Medication Sig Start Date End Date Taking? Authorizing Provider  acetaminophen (TYLENOL) 325 MG tablet Take 2 tablets (650 mg total) by mouth every 6 (six) hours as needed for mild pain (pain score 1-3) (or Fever >/= 101). 04/15/23  Yes [name], MD  amitriptyline (ELAVIL) 50 MG tablet Take 50 mg by mouth at bedtime.   Yes [provider]  aspirin EC 81 MG tablet Take 81 mg by mouth in the morning. Swallow whole.   Yes [provider]  cholecalciferol (VITAMIN D3) 25 MCG (1000 UNIT) tablet Take 1,000 Units by mouth every Monday, Wednesday, and Friday.   Yes [provider]  fludrocortisone (FLORINEF) 0.1 MG tablet Take 1 tablet (0.1 mg total) by mouth daily. 06/09/22  Yes Burnadette Pop, MD  gabapentin (NEURONTIN) 300 MG capsule Take 900 mg by mouth at bedtime. 02/05/21  Yes [provider]  ipratropium (ATROVENT) 0.03 % nasal spray Place 2 sprays into both nostrils 2 (two) times daily as needed for rhinitis.   Yes [provider]  JANUVIA 50 MG tablet Take 50 mg by mouth in the morning.   Yes [provider]  midodrine (PROAMATINE) 5 MG tablet Take 1 tablet (5 mg total) by mouth 3 (three) times daily with meals. Patient taking differently: Take 5 mg by mouth See admin instructions. Take 5 mg by mouth in the morning, at 2 PM, and bedtime 06/09/22  Yes  Adhikari, Amrit, MD  MYRBETRIQ 50 MG TB24 tablet Take 50 mg by mouth in the morning.   Yes [provider]  oxyCODONE (ROXICODONE) 5 MG immediate release tablet Take 1 tablet (5 mg total) by mouth every 4 (four) hours as needed for severe pain (pain score 7-10). 06/22/23  Yes Janine Ores K, PA-C  simvastatin (ZOCOR) 20 MG tablet Take 1 tablet (20 mg total) by mouth every evening. Do not take this medication until you finish Diflucan. Patient taking differently: Take 20 mg by mouth at bedtime. 05/02/22  Yes Standley Brooking, MD  Water For Irrigation, Sterile (STERILE WATER) Irrigate with as directed See admin instructions. Mix sterile water with white vinegar 4:1 and flush indwelling catheter nightly before bedtime   Yes [provider]  XIGDUO XR 10-500 MG TB24 Take 1 tablet by mouth daily before breakfast.   Yes [provider]      Allergies    Patient has no known allergies.    Review of Systems   Review of Systems  Constitutional:  Positive for fatigue.  Musculoskeletal:  Positive for arthralgias.    Physical Exam Updated Vital Signs BP (!) 144/81 (BP Location: Right Arm)   Pulse (!) 103   Temp 98.3 F (36.8 C)   Resp 17   Ht 5\' 8"  (1.727 m)   Wt 53.7 kg   SpO2 94%  BMI 18.00 kg/m  Physical Exam Vitals and nursing note reviewed.  Constitutional:      General: He is not in acute distress.    Appearance: He is well-developed. He is cachectic. He is not ill-appearing, toxic-appearing or diaphoretic.  HENT:     Head: Normocephalic and atraumatic.     Nose: Nose normal.     Mouth/Throat:     Mouth: Mucous membranes are moist.  Eyes:     Conjunctiva/sclera: Conjunctivae normal.  Cardiovascular:     Rate and Rhythm: Regular rhythm. Tachycardia present.     Pulses: Normal pulses.     Heart sounds: Normal heart sounds. No murmur heard. Pulmonary:     Effort: Pulmonary effort is normal. No respiratory distress.     Breath sounds: Normal breath  sounds.  Abdominal:     Palpations: Abdomen is soft.     Tenderness: There is no abdominal tenderness.  Genitourinary:    Comments: Patient has indwelling catheter with cloudy urine in catheter bag Musculoskeletal:        General: No swelling.     Cervical back: Neck supple.     Comments: Patient is obvious deformity to left shoulder.  Patient is neurovascularly intact.  +2 radial pulses.  Equal grip strength.  Skin:    General: Skin is warm and dry.     Capillary Refill: Capillary refill takes less than 2 seconds.     Findings: Bruising present.     Comments: Patient has mild bruising to bilateral knees, left ribs, and proximal humerus.  Neurological:     General: No focal deficit present.     Mental Status: He is alert and oriented to person, place, and time.     Cranial Nerves: No cranial nerve deficit.     Sensory: No sensory deficit.     Motor: No weakness.  Psychiatric:        Mood and Affect: Mood normal.     ED Results / Procedures / Treatments   Labs (all labs ordered are listed, but only abnormal results are displayed) Labs Reviewed  URINE CULTURE - Abnormal; Notable for the following components:      Result Value   Culture   (*)    Value: 70,000 COLONIES/mL YEAST Performed at [facility], [address] [place], [place] [address]    All other components within normal limits  URINALYSIS, W/ REFLEX TO CULTURE (INFECTION SUSPECTED) - Abnormal; Notable for the following components:   APPearance TURBID (*)    Glucose, UA >=500 (*)    Hgb urine dipstick MODERATE (*)    Protein, ur 100 (*)    Leukocytes,Ua LARGE (*)    Bacteria, UA MANY (*)    Non Squamous Epithelial 0-5 (*)    All other components within normal limits  BASIC METABOLIC PANEL - Abnormal; Notable for the following components:   Glucose, Bld 181 (*)    All other components within normal limits  CBC WITH DIFFERENTIAL/PLATELET - Abnormal; Notable for the following components:   Hemoglobin 12.9  (*)    All other components within normal limits  BASIC METABOLIC PANEL - Abnormal; Notable for the following components:   Glucose, Bld 112 (*)    Calcium 8.4 (*)    All other components within normal limits  CBC - Abnormal; Notable for the following components:   RBC 3.72 (*)    Hemoglobin 10.0 (*)    HCT 32.4 (*)    All other components within normal limits  LACTIC ACID, PLASMA - Abnormal; Notable for the following components:   Lactic Acid, Venous >9.0 (*)    All other components within normal limits  GLUCOSE, CAPILLARY - Abnormal; Notable for the following components:   Glucose-Capillary 237 (*)    All other components within normal limits  COMPREHENSIVE METABOLIC PANEL - Abnormal; Notable for the following components:   Glucose, Bld 167 (*)    BUN 25 (*)    Calcium 8.3 (*)    Total Protein 5.0 (*)    Albumin 2.1 (*)    AST 13 (*)    All other components within normal limits  CBC - Abnormal; Notable for the following components:   RBC 3.74 (*)    Hemoglobin 9.9 (*)    HCT 32.8 (*)    All other components within normal limits  MAGNESIUM - Abnormal; Notable for the following components:   Magnesium 1.6 (*)    All other components within normal limits  GLUCOSE, CAPILLARY - Abnormal; Notable for the following components:   Glucose-Capillary 294 (*)    All other components within normal limits  GLUCOSE, CAPILLARY - Abnormal; Notable for the following components:   Glucose-Capillary 124 (*)    All other components within normal limits  GLUCOSE, CAPILLARY - Abnormal; Notable for the following components:   Glucose-Capillary 115 (*)    All other components within normal limits  CBC - Abnormal; Notable for the following components:   RBC 3.36 (*)    Hemoglobin 9.0 (*)    HCT 30.3 (*)    MCHC 29.7 (*)    All other components within normal limits  BASIC METABOLIC PANEL WITH GFR - Abnormal; Notable for the following components:   Potassium 5.8 (*)    Glucose, Bld 260 (*)     BUN 24 (*)    Calcium 7.4 (*)    All other components within normal limits  GLUCOSE, CAPILLARY - Abnormal; Notable for the following components:   Glucose-Capillary 356 (*)    All other components within normal limits  GLUCOSE, CAPILLARY - Abnormal; Notable for the following components:   Glucose-Capillary 348 (*)    All other components within normal limits  GLUCOSE, CAPILLARY - Abnormal; Notable for the following components:   Glucose-Capillary 282 (*)    All other components within normal limits  CBG MONITORING, ED - Abnormal; Notable for the following components:   Glucose-Capillary 106 (*)    All other components within normal limits  CBG MONITORING, ED - Abnormal; Notable for the following components:   Glucose-Capillary 145 (*)    All other components within normal limits  CULTURE, BLOOD (ROUTINE X 2)  CULTURE, BLOOD (ROUTINE X 2)  SURGICAL PCR SCREEN  HIV ANTIBODY (ROUTINE TESTING W REFLEX)  LACTIC ACID, PLASMA  LACTIC ACID, PLASMA  CORTISOL-AM, BLOOD  GLUCOSE, CAPILLARY  PHOSPHORUS  POTASSIUM  CBG MONITORING, ED    EKG EKG Interpretation Date/Time:  Tuesday June 20 2023 13:16:51 EDT Ventricular Rate:  103 PR Interval:  187 QRS Duration:  129 QT Interval:  389 QTC Calculation: 510 R Axis:   269  Text Interpretation: Sinus tachycardia RBBB and LAFB When compared with ECG of 06/06/2022, has changed has increased Confirmed by Dione Booze ([id number]) on 06/20/2023 11:18:33 PM  Radiology DG Shoulder Left Port Result Date: 06/22/2023 CLINICAL DATA:  Closed fracture of left proximal humerus, postop. EXAM: LEFT SHOULDER COMPARISON:  Preoperative imaging. FINDINGS: Lateral plate and screw fixation of proximal humeral fracture. Improved fracture alignment from preoperative imaging. Recent  postsurgical change includes air and edema in the soft tissues. IMPRESSION: ORIF of proximal humeral fracture. Electronically Signed   By: Narda Rutherford M.D.   On: 06/22/2023 19:07    DG Shoulder Left Result Date: 06/22/2023 CLINICAL DATA:  Elective surgery. EXAM: LEFT SHOULDER - 2+ VIEW COMPARISON:  Radiograph 06/20/2023 FINDINGS: Ten fluoroscopic spot views of the left shoulder submitted from the operating room. Lateral plate and multi screw fixation of proximal humeral fracture. Fluoroscopy time 54 seconds. Dose 5.3 mGy. IMPRESSION: Intraoperative fluoroscopy during proximal humeral fracture fixation. Electronically Signed   By: Narda Rutherford M.D.   On: 06/22/2023 17:13   DG C-Arm 1-60 Min-No Report Result Date: 06/22/2023 Fluoroscopy was utilized by the requesting physician.  No radiographic interpretation.   DG C-Arm 1-60 Min-No Report Result Date: 06/22/2023 Fluoroscopy was utilized by the requesting physician.  No radiographic interpretation.    Procedures Procedures    Medications Ordered in ED Medications  senna-docusate (Senokot-S) tablet 1 tablet ( Oral MAR Unhold 06/22/23 1828)  cefTRIAXone (ROCEPHIN) 1 g in sodium chloride 0.9 % 100 mL IVPB ( Intravenous MAR Unhold 06/22/23 1828)  fluconazole (DIFLUCAN) tablet 200 mg (200 mg Oral Given 06/22/23 2102)  lactated ringers infusion (0 mLs Intravenous Stopped 06/21/23 0418)  mirabegron ER (MYRBETRIQ) tablet 50 mg ( Oral MAR Unhold 06/22/23 1828)  simvastatin (ZOCOR) tablet 20 mg (20 mg Oral Given 06/22/23 2103)  midodrine (PROAMATINE) tablet 5 mg (5 mg Oral Given 06/23/23 0742)  amitriptyline (ELAVIL) tablet 50 mg (50 mg Oral Given 06/22/23 2103)  fludrocortisone (FLORINEF) tablet 0.1 mg ( Oral MAR Unhold 06/22/23 1828)  gabapentin (NEURONTIN) capsule 900 mg (900 mg Oral Given 06/22/23 2103)  insulin aspart (novoLOG) injection 0-9 Units (3 Units Subcutaneous Given 06/23/23 0742)  insulin aspart (novoLOG) injection 0-5 Units (4 Units Subcutaneous Given 06/22/23 2304)  Chlorhexidine Gluconate Cloth 2 % PADS 6 each ( Topical MAR Unhold 06/22/23 1828)  mupirocin ointment (BACTROBAN) 2 % 1 Application (1 Application Nasal  Given 06/22/23 2114)  lip balm (CARMEX) ointment ( Topical MAR Unhold 06/22/23 1828)  diphenhydrAMINE (BENADRYL) 12.5 MG/5ML elixir 12.5-25 mg (has no administration in time range)  docusate sodium (COLACE) capsule 100 mg (100 mg Oral Given 06/22/23 2104)  bisacodyl (DULCOLAX) suppository 10 mg (has no administration in time range)  magnesium citrate solution 1 Bottle (has no administration in time range)  ondansetron (ZOFRAN) tablet 4 mg (has no administration in time range)    Or  ondansetron (ZOFRAN) injection 4 mg (has no administration in time range)  metoCLOPramide (REGLAN) tablet 5-10 mg (has no administration in time range)    Or  metoCLOPramide (REGLAN) injection 5-10 mg (has no administration in time range)  alum & mag hydroxide-simeth (MAALOX/MYLANTA) 200-200-20 MG/5ML suspension 30 mL (has no administration in time range)  menthol-cetylpyridinium (CEPACOL) lozenge 3 mg (has no administration in time range)    Or  phenol (CHLORASEPTIC) mouth spray 1 spray (has no administration in time range)  0.9 % NaCl with KCl 20 mEq/ L  infusion ( Intravenous New Bag/Given 06/22/23 2108)  acetaminophen (TYLENOL) tablet 325-650 mg (has no administration in time range)  HYDROmorphone (DILAUDID) injection 0.5-1 mg (has no administration in time range)  oxyCODONE (Oxy IR/ROXICODONE) immediate release tablet 5-10 mg (5 mg Oral Given 06/22/23 2103)  oxyCODONE (Oxy IR/ROXICODONE) immediate release tablet 10-15 mg (has no administration in time range)  acetaminophen (TYLENOL) tablet 1,000 mg (1,000 mg Oral Given 06/23/23 0740)  methocarbamol (ROBAXIN) tablet 500 mg ( Oral See  Alternative 06/22/23 2103)    Or  methocarbamol (ROBAXIN) injection 500 mg (500 mg Intravenous Given 06/22/23 2103)  morphine (PF) 4 MG/ML injection 4 mg (4 mg Intravenous Given 06/20/23 1306)  ondansetron (ZOFRAN) injection 4 mg (4 mg Intravenous Given 06/20/23 1306)  cefTRIAXone (ROCEPHIN) 1 g in sodium chloride 0.9 % 100 mL IVPB (0 g  Intravenous Stopped 06/20/23 1657)  morphine (PF) 4 MG/ML injection 4 mg (4 mg Intravenous Given 06/20/23 1542)  iohexol (OMNIPAQUE) 300 MG/ML solution 100 mL (100 mLs Intravenous Contrast Given 06/20/23 1714)  lactated ringers bolus 1,000 mL (0 mLs Intravenous Stopped 06/20/23 2005)  metoprolol tartrate (LOPRESSOR) injection 5 mg (5 mg Intravenous Given 06/20/23 1855)  lactated ringers bolus 1,000 mL (0 mLs Intravenous Stopped 06/20/23 2134)  chlorhexidine (HIBICLENS) 4 % liquid 4 Application (4 Applications Topical Given 06/22/23 0531)  acetaminophen (TYLENOL) tablet 1,000 mg (1,000 mg Oral Given 06/22/23 1204)  magnesium sulfate IVPB 2 g 50 mL (2 g Intravenous New Bag/Given 06/22/23 0944)  chlorhexidine (PERIDEX) 0.12 % solution 15 mL (15 mLs Mouth/Throat Given 06/22/23 1345)    ED Course/ Medical Decision Making/ A&P                                 Medical Decision Making Amount and/or Complexity of Data Reviewed Labs: ordered. Radiology: ordered.  Risk Prescription drug management. Decision regarding hospitalization.   This patient presents to the ED with chief complaint(s) of fall with pertinent past medical history of frequent falls which further complicates the presenting complaint. The complaint involves an extensive differential diagnosis and also carries with it a high risk of complications and morbidity.    The differential diagnosis includes shoulder dislocation, rib fracture, humeral fracture, UTI, AKI  Additional history obtained: Records reviewed previous admission documents  ED Course and Reassessment: Foley cath replaced  Independent labs interpretation:  The following labs were independently interpreted:  UA: Greater than 500 glucose, moderate hemoglobin, large leuks, 100 protein, many bacteria, greater than 50 WBCs CBC: Mild anemia BMP: No notable findings EKG: Sinus tachy  Independent visualization of imaging: - I independently visualized the following imaging  with scope of interpretation limited to determining acute life threatening conditions related to emergency care:  Left ribs x-ray: No acute cardiopulmonary process, no acute rib fractures Left shoulder x-ray: L humeral neck fracture  Consultation: - Consulted or discussed management/test interpretation w/ external professional: Ortho, Dr. Dion Saucier who recommended placing the patient in a sling and f/u in office for likely Sx intervention Hospitalist, Dr.  who was agreeable to admission for UTI  Consideration for admission or further workup: Admit for UTI and weakness.         Final Clinical Impression(s) / ED Diagnoses Final diagnoses:  Closed fracture of proximal end of left humerus, unspecified fracture morphology, initial encounter  Fall, initial encounter  Weakness  Urinary tract infection with hematuria, site unspecified    Rx / DC Orders ED Discharge Orders          Ordered    oxyCODONE (ROXICODONE) 5 MG immediate release tablet  Every 4 hours PRN        06/22/23 1724              Dolphus Jenny, PA-C 06/23/23 0847    Royanne Foots, DO 06/25/23 0710

## 2023-06-20 NOTE — Progress Notes (Signed)
 Orthopedic Tech Progress Note Patient Details:  Dustin Duffy 1955/04/21 [id number]  Ortho Devices Type of Ortho Device: Shoulder immobilizer Ortho Device/Splint Location: left Ortho Device/Splint Interventions: Ordered, Application, Adjustment   Post Interventions Patient Tolerated: Well Instructions Provided: Adjustment of device, Care of device  Kizzie Fantasia 06/20/2023, 2:04 PM

## 2023-06-20 NOTE — Hospital Course (Addendum)
 Dustin Duffy

## 2023-06-20 NOTE — H&P (Signed)
 History and Physical    Patient: Dustin Duffy:[id number] DOB: 22-Oct-1955 DOA: 06/20/2023 DOS: the patient was seen and examined on 06/20/2023 PCP: Irena Reichmann, DO  Patient coming from: Home  Chief Complaint:  Chief Complaint  Patient presents with   Fall   Shoulder Injury   HPI: Dustin Duffy is a 68 y.o. male with medical history significant of type 2 diabetes, hyperlipidemia, hypertension, frequent falls, BPH with urinary obstruction s/p chronic indwelling Foley catheter, CKD Stage 2 who presents to the ED after sustaining a fall.  Was in his usual state of health until earlier today. He was walking from his kitchen to his living room when his legs suddenly gave out on him and he fell down.  He began having left shoulder pain immediately thereafter.  Denies any lightheadedness, dizziness, blurry vision, chest pain, palpitations, SHOB prior to fall. Denies any fevers, chills, abdominal pain. He does have a chronic foley catheter in place due to BPH with urinary obstruction. Follows Dr. Alvester Morin, [facility].  Was admitted in January 2025 for fall suspected to be 2/2 UTI.  ED course: Vital signs notable for tachycardia, tachypnea, and elevated blood pressures.  CBC without leukocytosis, mild chronic anemia around his baseline.  BMP with glucose 181 and kidney function around his baseline.  Urinalysis with hematuria, large leukocytes, greater than 50 WBCs, many bacteria, budding yeast.  Urine culture pending.  Patient started on IV Rocephin by ED provider.  Left shoulder x-ray with acute displaced fracture of proximal left humerus.  Rib x-rays without any acute abnormalities noted.  ED provider consulted orthopedics who will evaluate patient.  [facility] hospitalist asked to evaluate patient for admission.  Review of Systems: As mentioned in the history of present illness. All other systems reviewed and are negative. Past Medical History:  Diagnosis Date   BPH without urinary obstruction     Diabetes mellitus type 2 in nonobese [facility])    Dyslipidemia    Essential hypertension    Renal impairment    Visual impairment    No past surgical history on file. Social History:  reports that he quit smoking about 13 years ago. His smoking use included cigarettes. He started smoking about 48 years ago. He has a 35 pack-year smoking history. He has never used smokeless tobacco. He reports that he does not currently use drugs. No history on file for alcohol use.  No Known Allergies  No family history on file.  Prior to Admission medications   Medication Sig Start Date End Date Taking? Authorizing Provider  acetaminophen (TYLENOL) 325 MG tablet Take 2 tablets (650 mg total) by mouth every 6 (six) hours as needed for mild pain (pain score 1-3) (or Fever >/= 101). 04/15/23   [name], MD  amitriptyline (ELAVIL) 50 MG tablet Take 50 mg by mouth at bedtime.    [provider]  aspirin EC 81 MG tablet Take 81 mg by mouth in the morning. Swallow whole.    [provider]  cholecalciferol (VITAMIN D3) 25 MCG (1000 UNIT) tablet Take 1,000 Units by mouth every Monday, Wednesday, and Friday.    [provider]  fludrocortisone (FLORINEF) 0.1 MG tablet Take 1 tablet (0.1 mg total) by mouth daily. 06/09/22   Burnadette Pop, MD  gabapentin (NEURONTIN) 300 MG capsule Take 900 mg by mouth at bedtime. 02/05/21   [provider]  ipratropium (ATROVENT) 0.03 % nasal spray Place 2 sprays into both nostrils 2 (two) times daily as needed for rhinitis.  Patient not taking: Reported on 04/17/2023    [provider]  midodrine (PROAMATINE) 5 MG tablet Take 1 tablet (5 mg total) by mouth 3 (three) times daily with meals. 06/09/22   Burnadette Pop, MD  MYRBETRIQ 50 MG TB24 tablet Take 50 mg by mouth in the morning.    [provider]  simvastatin (ZOCOR) 20 MG tablet Take 1 tablet (20 mg total) by mouth every evening. Do not take this medication until you  finish Diflucan. Patient taking differently: Take 20 mg by mouth at bedtime. 05/02/22   Standley Brooking, MD  XIGDUO XR 10-500 MG TB24 Take 1 tablet by mouth daily before breakfast.    [provider]    Physical Exam: Vitals:   06/20/23 1530 06/20/23 1700 06/20/23 1845 06/20/23 1846  BP: (!) 183/109 (!) 113/56 (!) 189/126   Pulse: (!) 110 (!) 117 (!) 139   Resp: (!) 29 (!) 31 (!) 24   Temp:    99.1 F (37.3 C)  TempSrc:      SpO2: 98% 98% 99%   Weight:      Height:       Physical Exam Constitutional:      Comments: Chronically ill-appearing elderly male, sitting in bed, NAD.  HENT:     Head: Normocephalic and atraumatic.     Mouth/Throat:     Mouth: Mucous membranes are moist.     Pharynx: Oropharynx is clear. No oropharyngeal exudate.  Eyes:     General: No scleral icterus.    Extraocular Movements: Extraocular movements intact.     Pupils: Pupils are equal, round, and reactive to light.  Cardiovascular:     Rate and Rhythm: Regular rhythm. Tachycardia present.     Heart sounds: Normal heart sounds. No murmur heard.    No friction rub. No gallop.  Pulmonary:     Effort: Pulmonary effort is normal. No respiratory distress.     Breath sounds: Normal breath sounds. No wheezing, rhonchi or rales.  Abdominal:     General: Bowel sounds are normal. There is no distension.     Palpations: Abdomen is soft.     Tenderness: There is no abdominal tenderness. There is no right CVA tenderness, left CVA tenderness, guarding or rebound.  Musculoskeletal:        General: No swelling.     Cervical back: Normal range of motion.     Comments: Left arm in sling. Mild ecchymosis noted on left upper arm. Limited ROM at left shoulder, limited due to pain. ROM at left elbow mildly decreased due to pain.   Skin:    General: Skin is warm and dry.  Neurological:     General: No focal deficit present.     Mental Status: He is alert and oriented to person, place, and time.   Psychiatric:        Mood and Affect: Mood normal.        Behavior: Behavior normal.     Data Reviewed:  There are no new results to review at this time.    Latest Ref Rng & Units 06/20/2023    1:16 PM 04/15/2023    5:07 AM 04/14/2023    4:41 AM  CBC  WBC 4.0 - 10.5 K/uL 8.9  7.6  8.3   Hemoglobin 13.0 - 17.0 g/dL 25.3  66.4  40.3   Hematocrit 39.0 - 52.0 % 42.9  36.4  38.7   Platelets 150 - 400 K/uL 234  278  320  Latest Ref Rng & Units 06/20/2023    1:16 PM 04/15/2023    5:07 AM 04/14/2023    4:41 AM  BMP  Glucose 70 - 99 mg/dL 409  811  914   BUN 8 - 23 mg/dL 20  29  45   Creatinine 0.61 - 1.24 mg/dL 7.82  9.56  2.13   Sodium 135 - 145 mmol/L 141  132  135   Potassium 3.5 - 5.1 mmol/L 4.0  3.0  3.1   Chloride 98 - 111 mmol/L 101  98  99   CO2 22 - 32 mmol/L 32  27  22   Calcium 8.9 - 10.3 mg/dL 9.0  8.0  8.4    Urinalysis    Component Value Date/Time   COLORURINE YELLOW 06/20/2023 1214   APPEARANCEUR TURBID (A) 06/20/2023 1214   LABSPEC 1.013 06/20/2023 1214   PHURINE 5.0 06/20/2023 1214   GLUCOSEU >=500 (A) 06/20/2023 1214   HGBUR MODERATE (A) 06/20/2023 1214   BILIRUBINUR NEGATIVE 06/20/2023 1214   BILIRUBINUR negative 06/23/2022 1731   KETONESUR NEGATIVE 06/20/2023 1214   PROTEINUR 100 (A) 06/20/2023 1214   UROBILINOGEN 0.2 06/23/2022 1731   NITRITE NEGATIVE 06/20/2023 1214   LEUKOCYTESUR LARGE (A) 06/20/2023 1214   Lactic Acid, Venous    Component Value Date/Time   LATICACIDVEN >9.0 (HH) 06/20/2023 1835     CT HUMERUS LEFT W CONTRAST Result Date: 06/20/2023 CLINICAL DATA:  Upper arm trauma EXAM: CT OF THE UPPER LEFT EXTREMITY WITH CONTRAST TECHNIQUE: Multidetector CT imaging of the upper left extremity was performed according to the standard protocol following intravenous contrast administration. RADIATION DOSE REDUCTION: This exam was performed according to the departmental dose-optimization program which includes automated exposure control,  adjustment of the mA and/or kV according to patient size and/or use of iterative reconstruction technique. CONTRAST:  OMNIPAQUE IOHEXOL 300 MG/ML  SOLN COMPARISON:  X-ray left shoulder 06/20/2023 FINDINGS: Bones/Joint/Cartilage Acute markedly medially displaced and comminuted left humeral head and neck fracture. No other acute fracture of the humerus. No dislocation of the humerus. Ligaments Suboptimally assessed by CT. Muscles and Tendons Grossly unremarkable. Soft tissues Subcutaneus soft tissue edema. IMPRESSION: Acute markedly displaced and comminuted left humeral head and neck fracture. Electronically Signed   By: Tish Frederickson M.D.   On: 06/20/2023 18:07   DG Ribs Unilateral W/Chest Left Result Date: 06/20/2023 CLINICAL DATA:  Fall.  Bruising. EXAM: LEFT RIBS AND CHEST - 3+ VIEW COMPARISON:  None Available. FINDINGS: Bilateral lung fields are clear. Bilateral costophrenic angles are clear. Normal cardio-mediastinal silhouette. No acute rib fracture or focal rib lesion. Proximal left humerus fracture again seen. The soft tissues are within normal limits. IMPRESSION: *No acute cardiopulmonary abnormality. *No acute rib fracture or focal rib lesion. *Proximal left humerus fracture again seen. Electronically Signed   By: Jules Schick M.D.   On: 06/20/2023 15:26   DG Shoulder Left Result Date: 06/20/2023 CLINICAL DATA:  Fall.  Limited mobility. EXAM: LEFT SHOULDER - 2+ VIEW COMPARISON:  None Available. FINDINGS: There is proximally migrated and displaced acute fracture of the proximal left humerus. No other acute fracture or dislocation. No aggressive osseous lesion. Glenohumeral and acromioclavicular joints are normal in alignment. No soft tissue swelling. No radiopaque foreign bodies. IMPRESSION: *Acute displaced fracture of the proximal left humerus. Electronically Signed   By: Jules Schick M.D.   On: 06/20/2023 15:24     Assessment and Plan: No notes have been filed under this hospital  service. Service: Hospitalist  Sepsis 2/2 UTI Patient presenting with tachycardia and tachypnea, found to have acute UTI.  Lactic acid resulted at greater than 9.  Patient does meet criteria for sepsis.  He is currently hemodynamically stable.  Urine culture is pending.  I have ordered blood cultures but patient has received a dose of Rocephin in the ED.  His urinalysis also revealed budding yeast and so I have added fluconazole to his regimen.  He has received 2 L IV fluids thus far for rehydration.  Per discussion with nursing staff, patient's Foley catheter has been switched to a new one on arrival to the ED. -IV Rocephin -Oral fluconazole 200 mg daily -Follow-up urine culture and blood cultures -Trend lactic acid -s/p 2L IVF, now on maintenance IVF at 75cc/hr for 8 hrs -SCDs for DVT ppx (holding off on pharmacological dvt ppx in anticipation for possible surgery with ortho) -Admit to inpatient/progressive  Proximal L humerus fracture -orthopedic surgery consulted, obtaining CT for surgical planning -continue left shoulder sling for comfort -Non-weight bearing LUE -will make NPO at midnight in case for possible surgery tomorrow -Tylenol every 6 hours as needed, oxycodone 5 mg every 4 hours as needed for moderate pain, Dilaudid 1 mg every 3 hours as needed for severe pain  Frequent falls -admitted in January for fall suspected to be 2/2 UTI -similar presentation for this admission -management of UTI as noted above -PT/OT eval  Sinus tachycardia Heart rates ranging in the 100s to 130s since arrival to the ED.  EKG showing sinus tachycardia.  Suspect secondary to underlying infection and pain from humerus fracture. -Management of sepsis as noted above -Pain control as noted above -telemetry  Hypertension History of hypotension Patient with history of hypotension thought to be 2/2 autonomic dysfunction stemming from T2DM. He is on midodrine 5 mg 3 times daily at home along with  fludrocortisone 0.1mg  daily.  His blood pressures have ranged in the 150s-180s/90s-120s since arrival to the ED.  Provided one-time dose of IV Lopressor 5 mg with improvement in his blood pressures. -Trend blood pressure curve -Holding home midodrine and fludrocortisone -f/u AM cortisol tomorrow morning  T2DM On xigduo 10-500mg  daily at home. Question whether SGLT-2i component is leading to recurrent UTIs. Will hold home xigduo. A1c 8.0% in 04/12/2023. -trend CBGs, goal 140-180 -consider adding SSI if CBGs above goal -holding home xigduo, consider discontinuation of SGLT-2i at discharge  HLD -resume home zocor 20mg  qhs  BPH with urinary obstruction s/p chronic indwelling Foley catheter -follows Dr. Alvester Morin, [facility] -foley catheter switched on arrival to ED -continue home mirabegron 50mg  daily  CKD Stage 2 -kidney function stable and at baseline   Advance Care Planning:   Code Status: Full Code   Consults: none  Family Communication: no family at bedside  Severity of Illness: The appropriate patient status for this patient is INPATIENT. Inpatient status is judged to be reasonable and necessary in order to provide the required intensity of service to ensure the patient's safety. The patient's presenting symptoms, physical exam findings, and initial radiographic and laboratory data in the context of their chronic comorbidities is felt to place them at high risk for further clinical deterioration. Furthermore, it is not anticipated that the patient will be medically stable for discharge from the hospital within 2 midnights of admission.   * I certify that at the point of admission it is my clinical judgment that the patient will require inpatient hospital care spanning beyond 2 midnights from the point of admission due to  high intensity of service, high risk for further deterioration and high frequency of surveillance required.*  Portions of this note were generated with Dragon  dictation software. Dictation errors may occur despite best attempts at proofreading.  Author: Briscoe Burns, MD 06/20/2023 8:26 PM  For on call review [email or web address].

## 2023-06-21 ENCOUNTER — Encounter (HOSPITAL_COMMUNITY): Payer: Self-pay | Admitting: Internal Medicine

## 2023-06-21 DIAGNOSIS — N390 Urinary tract infection, site not specified: Secondary | ICD-10-CM | POA: Diagnosis not present

## 2023-06-21 DIAGNOSIS — A419 Sepsis, unspecified organism: Secondary | ICD-10-CM | POA: Diagnosis not present

## 2023-06-21 LAB — CBC
HCT: 32.4 % — ABNORMAL LOW (ref 39.0–52.0)
Hemoglobin: 10 g/dL — ABNORMAL LOW (ref 13.0–17.0)
MCH: 26.9 pg (ref 26.0–34.0)
MCHC: 30.9 g/dL (ref 30.0–36.0)
MCV: 87.1 fL (ref 80.0–100.0)
Platelets: 202 10*3/uL (ref 150–400)
RBC: 3.72 MIL/uL — ABNORMAL LOW (ref 4.22–5.81)
RDW: 15.4 % (ref 11.5–15.5)
WBC: 6.6 10*3/uL (ref 4.0–10.5)
nRBC: 0 % (ref 0.0–0.2)

## 2023-06-21 LAB — GLUCOSE, CAPILLARY
Glucose-Capillary: 94 mg/dL (ref 70–99)
Glucose-Capillary: 237 mg/dL — ABNORMAL HIGH (ref 70–99)
Glucose-Capillary: 294 mg/dL — ABNORMAL HIGH (ref 70–99)

## 2023-06-21 LAB — BASIC METABOLIC PANEL
Anion gap: 9 (ref 5–15)
BUN: 22 mg/dL (ref 8–23)
CO2: 27 mmol/L (ref 22–32)
Calcium: 8.4 mg/dL — ABNORMAL LOW (ref 8.9–10.3)
Chloride: 101 mmol/L (ref 98–111)
Creatinine, Ser: 1.08 mg/dL (ref 0.61–1.24)
GFR, Estimated: >60 mL/min (ref >60)
Glucose, Bld: 112 mg/dL — ABNORMAL HIGH (ref 70–99)
Potassium: 4 mmol/L (ref 3.5–5.1)
Sodium: 137 mmol/L (ref 135–145)

## 2023-06-21 LAB — SURGICAL PCR SCREEN
MRSA, PCR: NEGATIVE
Staphylococcus aureus: NEGATIVE

## 2023-06-21 LAB — URINE CULTURE: Culture: 70000 — AB

## 2023-06-21 LAB — CBG MONITORING, ED: Glucose-Capillary: 87 mg/dL (ref 70–99)

## 2023-06-21 LAB — CORTISOL-AM, BLOOD: Cortisol - AM: 11.9 ug/dL (ref 6.7–22.6)

## 2023-06-21 LAB — LACTIC ACID, PLASMA: Lactic Acid, Venous: 0.9 mmol/L (ref 0.5–1.9)

## 2023-06-21 MED ORDER — MIDODRINE HCL 5 MG PO TABS
5.0000 mg | ORAL_TABLET | Freq: Three times a day (TID) | ORAL | Status: DC
  Administered 2023-06-21 – 2023-06-25 (×11): 5 mg via ORAL
  Filled 2023-06-21 (×11): qty 1

## 2023-06-21 MED ORDER — MUPIROCIN 2 % EX OINT
1.0000 | TOPICAL_OINTMENT | Freq: Two times a day (BID) | CUTANEOUS | Status: AC
  Administered 2023-06-21 – 2023-06-26 (×9): 1 via NASAL
  Filled 2023-06-21 (×3): qty 22

## 2023-06-21 MED ORDER — FLUDROCORTISONE ACETATE 0.1 MG PO TABS
0.1000 mg | ORAL_TABLET | Freq: Every day | ORAL | Status: DC
  Administered 2023-06-23 – 2023-06-27 (×5): 0.1 mg via ORAL
  Filled 2023-06-21 (×6): qty 1

## 2023-06-21 MED ORDER — GABAPENTIN 300 MG PO CAPS
900.0000 mg | ORAL_CAPSULE | Freq: Every day | ORAL | Status: DC
  Administered 2023-06-21 – 2023-06-26 (×6): 900 mg via ORAL
  Filled 2023-06-21 (×6): qty 3

## 2023-06-21 MED ORDER — ACETAMINOPHEN 500 MG PO TABS
1000.0000 mg | ORAL_TABLET | Freq: Once | ORAL | Status: AC
  Administered 2023-06-22: 1000 mg via ORAL
  Filled 2023-06-21: qty 2

## 2023-06-21 MED ORDER — POVIDONE-IODINE 10 % EX SWAB: 2.0000 | Freq: Once | CUTANEOUS | Status: DC

## 2023-06-21 MED ORDER — INSULIN ASPART 100 UNIT/ML IJ SOLN
0.0000 [IU] | Freq: Every day | INTRAMUSCULAR | Status: DC
  Administered 2023-06-21: 3 [IU] via SUBCUTANEOUS
  Administered 2023-06-22: 4 [IU] via SUBCUTANEOUS
  Administered 2023-06-23: 5 [IU] via SUBCUTANEOUS
  Administered 2023-06-24 – 2023-06-25 (×2): 4 [IU] via SUBCUTANEOUS
  Filled 2023-06-21: qty 0.05

## 2023-06-21 MED ORDER — CHLORHEXIDINE GLUCONATE CLOTH 2 % EX PADS
6.0000 | MEDICATED_PAD | Freq: Every day | CUTANEOUS | Status: DC
Start: 2023-06-21 — End: 2023-06-27
  Administered 2023-06-21 – 2023-06-26 (×5): 6 via TOPICAL

## 2023-06-21 MED ORDER — AMITRIPTYLINE HCL 25 MG PO TABS
50.0000 mg | ORAL_TABLET | Freq: Every day | ORAL | Status: DC
  Administered 2023-06-21 – 2023-06-26 (×6): 50 mg via ORAL
  Filled 2023-06-21 (×6): qty 2

## 2023-06-21 MED ORDER — INSULIN ASPART 100 UNIT/ML IJ SOLN
0.0000 [IU] | Freq: Three times a day (TID) | INTRAMUSCULAR | Status: DC
  Administered 2023-06-21: 5 [IU] via SUBCUTANEOUS
  Administered 2023-06-23: 7 [IU] via SUBCUTANEOUS
  Administered 2023-06-23: 5 [IU] via SUBCUTANEOUS
  Administered 2023-06-23: 3 [IU] via SUBCUTANEOUS
  Administered 2023-06-24: 5 [IU] via SUBCUTANEOUS
  Administered 2023-06-24: 3 [IU] via SUBCUTANEOUS
  Administered 2023-06-24 – 2023-06-25 (×2): 5 [IU] via SUBCUTANEOUS
  Administered 2023-06-25: 9 [IU] via SUBCUTANEOUS
  Administered 2023-06-25: 2 [IU] via SUBCUTANEOUS
  Administered 2023-06-26: 3 [IU] via SUBCUTANEOUS
  Administered 2023-06-26: 7 [IU] via SUBCUTANEOUS
  Administered 2023-06-26: 9 [IU] via SUBCUTANEOUS
  Administered 2023-06-27: 3 [IU] via SUBCUTANEOUS
  Filled 2023-06-21: qty 0.09

## 2023-06-21 MED ORDER — CHLORHEXIDINE GLUCONATE 4 % EX SOLN
60.0000 mL | Freq: Once | CUTANEOUS | Status: AC
  Administered 2023-06-22: 4 via TOPICAL
  Filled 2023-06-21 (×2): qty 60

## 2023-06-21 NOTE — Plan of Care (Signed)
  Problem: Education: Goal: Ability to describe self-care measures that may prevent or decrease complications (Diabetes Survival Skills Education) will improve Outcome: Progressing Goal: Individualized Educational Video(s) Outcome: Progressing   Problem: Coping: Goal: Ability to adjust to condition or change in health will improve Outcome: Progressing   Problem: Fluid Volume: Goal: Ability to maintain a balanced intake and output will improve Outcome: Progressing   Problem: Skin Integrity: Goal: Risk for impaired skin integrity will decrease Outcome: Progressing   Problem: Tissue Perfusion: Goal: Adequacy of tissue perfusion will improve Outcome: Progressing   Problem: Health Behavior/Discharge Planning: Goal: Ability to manage health-related needs will improve Outcome: Progressing

## 2023-06-21 NOTE — Progress Notes (Signed)
 Attempted to call into room/mobile listed in chart to discuss operative plan again with patient but no answer, will try back later. I did discuss risks, benefits, and alternative of left proximal humerus ORIF yesterday and he had elected to move forward with surgery.   Tentative plan is for ORIF of left proximal humerus tomorrow afternoon with Dr. Dion Saucier if medically optimized, please keep NPO after midnight.   Janine Ores, PA-C

## 2023-06-21 NOTE — Progress Notes (Signed)
 PROGRESS NOTE  Dustin Duffy:[id number] DOB: [birth year] DOA: 06/20/2023 PCP: Irena Reichmann, DO   LOS: 1 day   Brief Narrative / Interim history: 68 year old male with DM2, BPH with urinary obstruction with chronic Foley catheter, CKD 2, HTN, HLD comes into the hospital after a fall.  He was walking from his kitchen to the living room when all of a sudden his legs gave out and fell on the left side.  He had left shoulder pain afterwards.  He presented to the ER, was found to have a UTI and x-ray of the shoulder showed acute displaced fracture of the proximal left humerus.  Orthopedic surgery was consulted and he was admitted to the hospital  Subjective / 24h Interval events: Planes of being thirsty this morning.  He is hoping to have the surgery done today  Assesement and Plan: Principal problem Sepsis due to UTI -met sepsis criteria with tachycardia, tachypnea, elevated lactic acid, although latter could have been in the setting of recent fall.  Urine culture sent and pending, patient was started on ceftriaxone as well as fluconazole, continue for now -Sepsis physiology resolved  Active problems Proximal L humerus fracture -orthopedic surgery consulted, plan for operative repair tomorrow.  Continue sling, nonweightbearing left upper extremity.  N.p.o. past midnight tonight -Continue pain control   Frequent falls -admitted in January for fall suspected to be 2/2 UTI.  PT/OT consulted  Sinus tachycardia - Heart rates ranging in the 100s to 130s since arrival to the ED.  EKG showing sinus tachycardia.  Suspect secondary to underlying infection and pain from humerus fracture.  Improving with pain control and antibiotics   Hypertension, history of hypotension - Patient with history of hypotension thought to be 2/2 autonomic dysfunction stemming from T2DM. He is on midodrine 5 mg 3 times daily at home along with fludrocortisone 0.1mg  daily.  Resume here.  Has been slightly hypertensive in the  setting of pain   T2DM - On xigduo 10-500mg  daily at home. Question whether SGLT-2i component is leading to recurrent UTIs. Will hold home xigduo. A1c 8.0% in 04/12/2023.  Add sliding scale   HLD -resume home zocor 20mg  qhs   BPH with urinary obstruction s/p chronic indwelling Foley catheter -follows Dr. Alvester Morin, [facility]. Foley catheter switched on arrival to ED. Continue home mirabegron 50mg  daily    CKD Stage 2 -kidney function stable and at baseline  Scheduled Meds:  fluconazole  200 mg Oral QHS   mirabegron ER  50 mg Oral Daily   simvastatin  20 mg Oral QHS   Continuous Infusions:  cefTRIAXone (ROCEPHIN)  IV     PRN Meds:.acetaminophen **OR** acetaminophen, HYDROmorphone (DILAUDID) injection, oxyCODONE, senna-docusate  Current Outpatient Medications  Medication Instructions   acetaminophen (TYLENOL) 650 mg, Oral, Every 6 hours PRN   amitriptyline (ELAVIL) 50 mg, Oral, Daily at bedtime   aspirin EC 81 mg, Oral, Every morning, Swallow whole.   cholecalciferol (VITAMIN D3) 1,000 Units, Oral, Every M-W-F   fludrocortisone (FLORINEF) 0.1 mg, Oral, Daily   gabapentin (NEURONTIN) 900 mg, Oral, Daily at bedtime   ipratropium (ATROVENT) 0.03 % nasal spray 2 sprays, Each Nare, 2 times daily PRN   Januvia 50 mg, Every morning   midodrine (PROAMATINE) 5 mg, Oral, 3 times daily with meals   Myrbetriq 50 mg, Oral, Every morning   simvastatin (ZOCOR) 20 mg, Oral, Every evening, Do not take this medication until you finish Diflucan.   Water For Irrigation, Sterile (STERILE WATER) Irrigation, See admin instructions,  Mix sterile water with white vinegar 4:1 and flush indwelling catheter nightly before bedtime    XIGDUO XR 10-500 MG TB24 1 tablet, Daily before breakfast    Diet Orders (From admission, onward)     Start     Ordered   06/21/23 0733  Diet Carb Modified Fluid consistency: Thin; Room service appropriate? Yes  Diet effective now       Question Answer Comment  Diet-HS Snack?  Nothing   Calorie Level Medium 1600-2000   Fluid consistency: Thin   Room service appropriate? Yes      06/21/23 0732            DVT prophylaxis: Place and maintain sequential compression device Start: 06/20/23 2044   Lab Results  Component Value Date   PLT 202 06/21/2023      Code Status: Full Code  Family Communication: No family at bedside  Status is: Inpatient Remains inpatient appropriate because: severity of illness  Level of care: Telemetry  Consultants:  Orthopedic surgery   Objective: Vitals:   06/21/23 0500 06/21/23 0530 06/21/23 0630 06/21/23 0830  BP: 103/71 118/77 112/74 129/75  Pulse: 97 96 98 (!) 102  Resp: 16 17 16 17   Temp:   (!) 97.1 F (36.2 C)   TempSrc:   Oral   SpO2: 96% 99% 97% 96%  Weight:      Height:        Intake/Output Summary (Last 24 hours) at 06/21/2023 0954 Last data filed at 06/20/2023 2005 Gross per 24 hour  Intake 1100 ml  Output --  Net 1100 ml   Wt Readings from Last 3 Encounters:  06/20/23 53.5 kg  04/12/23 63 kg  06/06/22 63.5 kg    Examination:  Constitutional: NAD Eyes: no scleral icterus ENMT: Mucous membranes are moist.  Neck: normal, supple Respiratory: clear to auscultation bilaterally, no wheezing, no crackles. Normal respiratory effort. No accessory muscle use.  Cardiovascular: Regular rate and rhythm, no murmurs / rubs / gallops. No LE edema.  Abdomen: non distended, no tenderness. Bowel sounds positive.  Musculoskeletal: no clubbing / cyanosis.    Data Reviewed: I have independently reviewed following labs and imaging studies   CBC Recent Labs  Lab 06/20/23 1316 06/21/23 0504  WBC 8.9 6.6  HGB 12.9* 10.0*  HCT 42.9 32.4*  PLT 234 202  MCV 86.8 87.1  MCH 26.1 26.9  MCHC 30.1 30.9  RDW 15.3 15.4  LYMPHSABS 0.7  --   MONOABS 0.8  --   EOSABS 0.0  --   BASOSABS 0.1  --     Recent Labs  Lab 06/20/23 1316 06/20/23 1835 06/20/23 2138 06/21/23 0114 06/21/23 0504  NA 141  --   --    --  137  K 4.0  --   --   --  4.0  CL 101  --   --   --  101  CO2 32  --   --   --  27  GLUCOSE 181*  --   --   --  112*  BUN 20  --   --   --  22  CREATININE 1.18  --   --   --  1.08  CALCIUM 9.0  --   --   --  8.4*  LATICACIDVEN  --  >9.0* 1.1 0.9  --     ------------------------------------------------------------------------------------------------------------------ No results for input(s): "CHOL", "HDL", "LDLCALC", "TRIG", "CHOLHDL", "LDLDIRECT" in the last 72 hours.  Lab Results  Component Value Date  HGBA1C 8.0 (H) 04/12/2023   ------------------------------------------------------------------------------------------------------------------ No results for input(s): "TSH", "T4TOTAL", "T3FREE", "THYROIDAB" in the last 72 hours.  Invalid input(s): "FREET3"  Cardiac Enzymes No results for input(s): "CKMB", "TROPONINI", "MYOGLOBIN" in the last 168 hours.  Invalid input(s): "CK" ------------------------------------------------------------------------------------------------------------------ No results found for: "BNP"  CBG: Recent Labs  Lab 06/20/23 1743 06/20/23 2217 06/21/23 0848  GLUCAP 106* 145* 87    Recent Results (from the past 240 hours)  Culture, blood (Routine X 2) w Reflex to ID Panel     Status: None (Preliminary result)   Collection Time: 06/20/23  8:05 PM   Specimen: BLOOD  Result Value Ref Range Status   Specimen Description   Final    BLOOD BLOOD LEFT HAND Performed at [facility], [address], [place], [place] [address]    Special Requests   Final    BOTTLES DRAWN AEROBIC AND ANAEROBIC Blood Culture results may not be optimal due to an inadequate volume of blood received in culture bottles Performed at [facility], [address], [place], [place] [address]    Culture   Final    NO GROWTH < 12 HOURS Performed at [facility], [address], [place], [place] [address]    Report Status PENDING   Incomplete  Culture, blood (Routine X 2) w Reflex to ID Panel     Status: None (Preliminary result)   Collection Time: 06/20/23  8:05 PM   Specimen: BLOOD  Result Value Ref Range Status   Specimen Description   Final    BLOOD RIGHT ANTECUBITAL Performed at [facility], [address] [place], [place] [address]    Special Requests   Final    BOTTLES DRAWN AEROBIC AND ANAEROBIC Blood Culture results may not be optimal due to an inadequate volume of blood received in culture bottles Performed at [facility], [address] [place], [place] [address]    Culture   Final    NO GROWTH < 12 HOURS Performed at [facility], [address] [place], [place] [address]    Report Status PENDING  Incomplete     Radiology Studies: CT HUMERUS LEFT W CONTRAST Result Date: 06/20/2023 CLINICAL DATA:  Upper arm trauma EXAM: CT OF THE UPPER LEFT EXTREMITY WITH CONTRAST TECHNIQUE: Multidetector CT imaging of the upper left extremity was performed according to the standard protocol following intravenous contrast administration. RADIATION DOSE REDUCTION: This exam was performed according to the departmental dose-optimization program which includes automated exposure control, adjustment of the mA and/or kV according to patient size and/or use of iterative reconstruction technique. CONTRAST:  OMNIPAQUE IOHEXOL 300 MG/ML  SOLN COMPARISON:  X-ray left shoulder 06/20/2023 FINDINGS: Bones/Joint/Cartilage Acute markedly medially displaced and comminuted left humeral head and neck fracture. No other acute fracture of the humerus. No dislocation of the humerus. Ligaments Suboptimally assessed by CT. Muscles and Tendons Grossly unremarkable. Soft tissues Subcutaneus soft tissue edema. IMPRESSION: Acute markedly displaced and comminuted left humeral head and neck fracture. Electronically Signed   By: Tish Frederickson M.D.   On: 06/20/2023 18:07   DG Ribs Unilateral W/Chest  Left Result Date: 06/20/2023 CLINICAL DATA:  Fall.  Bruising. EXAM: LEFT RIBS AND CHEST - 3+ VIEW COMPARISON:  None Available. FINDINGS: Bilateral lung fields are clear. Bilateral costophrenic angles are clear. Normal cardio-mediastinal silhouette. No acute rib fracture or focal rib lesion. Proximal left humerus fracture again seen. The soft tissues are within normal limits.  IMPRESSION: *No acute cardiopulmonary abnormality. *No acute rib fracture or focal rib lesion. *Proximal left humerus fracture again seen. Electronically Signed   By: Jules Schick M.D.   On: 06/20/2023 15:26   DG Shoulder Left Result Date: 06/20/2023 CLINICAL DATA:  Fall.  Limited mobility. EXAM: LEFT SHOULDER - 2+ VIEW COMPARISON:  None Available. FINDINGS: There is proximally migrated and displaced acute fracture of the proximal left humerus. No other acute fracture or dislocation. No aggressive osseous lesion. Glenohumeral and acromioclavicular joints are normal in alignment. No soft tissue swelling. No radiopaque foreign bodies. IMPRESSION: *Acute displaced fracture of the proximal left humerus. Electronically Signed   By: Jules Schick M.D.   On: 06/20/2023 15:24     Pamella Pert, MD, PhD [facility]  Between 7 am - 7 pm I am available, please contact me via Amion (for emergencies) or Securechat (non urgent messages)  Between 7 pm - 7 am I am not available, please contact night coverage MD/APP via Amion

## 2023-06-21 NOTE — TOC Progression Note (Signed)
 Transition of Care [facility]) - Progression Note    Patient Details  Name: ICARUS PARTCH MRN: [id number] Date of Birth: January 20, 1956  [facility] of [facility]) CM/SW Contact  Justiss Gerbino, Olegario Messier, RN Phone Number: 06/21/2023, 2:31 PM  Clinical Narrative:  Patient declines ST SNF prefers returning back home w/[employer] rep Lynette-already active. Has own transport home.     Expected Discharge Plan: Home w Home Health Services Barriers to Discharge: Continued Medical Work up  Expected Discharge Plan and Services   Discharge Planning Services: CM Consult Post Acute Care Choice: Home Health Living arrangements for the past 2 months: Single Family Home                           HH Arranged: PT, OT HH Agency: [facility] Date [facility] Agency Contacted: 06/21/23 Time HH Agency Contacted: 1431 Representative spoke with at [facility] Agency: Haywood Lasso   Social Determinants of Health (SDOH) Interventions SDOH Screenings   Food Insecurity: No Food Insecurity (06/21/2023)  Housing: Low Risk  (06/21/2023)  Transportation Needs: Unmet Transportation Needs (06/21/2023)  Utilities: Not At Risk (06/21/2023)  Depression (PHQ2-9): Low Risk  (04/02/2021)  Social Connections: Socially Isolated (06/21/2023)  Tobacco Use: Medium Risk (04/12/2023)    Readmission Risk Interventions    05/02/2022   10:09 AM  Readmission Risk Prevention Plan  Transportation Screening Complete  PCP or Specialist Appt within 5-7 Days Complete  Home Care Screening Complete  Medication Review (RN CM) Complete

## 2023-06-21 NOTE — Evaluation (Signed)
 Physical Therapy Evaluation Patient Details Name: Dustin Duffy MRN: [id number] DOB: [birth year] Today's Date: 06/21/2023  History of Present Illness  Patient is a 68 year old male who presented after a fall at home resulting in L shoulder pain. Patient was admitted with UTI, proximal L humerus fracture, sinus tachycardia. Patient is pending shoulder surgery at this time.   PMH: urology 04/28/22 with UTI, DM II, HLD, BPH,T12 wedge compression fracture, legally blind,  Clinical Impression  Pt admitted with above diagnosis.  Pt currently with functional limitations due to the deficits listed below (see PT Problem List). Pt will benefit from acute skilled PT to increase their independence and safety with mobility to allow discharge.    Patient   reports multiple falls in past. Patient lives indpenendetly, uses cane in house and rollator outside.  Patient now  waiting on L humerus surgery.  .skileed. Patient  reports that he will return home, has a small dog .  Patient does not appear to acknowledge his limitations now that LUE is restricted. Continue PT for mobility    .      If plan is discharge home, recommend the following: A little help with walking and/or transfers;A little help with bathing/dressing/bathroom;Assistance with cooking/housework;Help with stairs or ramp for entrance;Assist for transportation   Can travel by private vehicle   Yes    Equipment Recommendations None recommended by PT  Recommendations for Other Services       Functional Status Assessment Patient has had a recent decline in their functional status and demonstrates the ability to make significant improvements in function in a reasonable and predictable amount of time.     Precautions / Restrictions Precautions Precautions: Fall Required Braces or Orthoses: Sling Restrictions LUE Weight Bearing Per Provider Order: Non weight bearing      Mobility  Bed Mobility Overal bed mobility: Needs Assistance              General bed mobility comments: assist trunk to sit upright    Transfers Overall transfer level: Needs assistance Equipment used: 1 person hand held assist Transfers: Sit to/from Stand Sit to Stand: Mod assist           General transfer comment: stood at stretcher and able to side step with HHA    Ambulation/Gait                  Stairs            Wheelchair Mobility     Tilt Bed    Modified Rankin (Stroke Patients Only)       Balance   Sitting-balance support: Single extremity supported, Feet supported Sitting balance-Leahy Scale: Fair     Standing balance support: During functional activity, Single extremity supported Standing balance-Leahy Scale: Poor                               Pertinent Vitals/Pain Pain Assessment Faces Pain Scale: Hurts even more Pain Location: L shoulder Pain Descriptors / Indicators: Grimacing, Guarding Pain Intervention(s): Limited activity within patient's tolerance, Monitored during session    Home Living Family/patient expects to be discharged to:: Private residence Living Arrangements: Alone Available Help at Discharge: Family;Friend(s);Available PRN/intermittently Type of Home: House Home Access: Stairs to enter Entrance Stairs-Rails: Right;Left;Can reach both Entrance Stairs-Number of Steps: 3+1   Home Layout: One level Home Equipment: Agricultural consultant (2 wheels);Rollator (4 wheels) Additional Comments: reported having a cane at home  Prior Function Prior Level of Function : Independent/Modified Independent             Mobility Comments: pt reports use of rollator or SPC ADLs Comments: takes care of small dog at home, friend assists with IADLs and med management     Extremity/Trunk Assessment   Upper Extremity Assessment Upper Extremity Assessment: Left hand dominant;LUE deficits/detail LUE Deficits / Details: humeral fracture. patient TD to adjust sling. LUE: Unable to  fully assess due to pain LUE Coordination: decreased fine motor;decreased gross motor    Lower Extremity Assessment Lower Extremity Assessment: Generalized weakness    Cervical / Trunk Assessment Cervical / Trunk Assessment: Normal  Communication   Communication Communication: No apparent difficulties    Cognition Arousal: Alert Behavior During Therapy: Flat affect   PT - Cognitive impairments: Awareness, Safety/Judgement                       PT - Cognition Comments: states that he can go home even though states that he needs assistance to feed him since LUE is dominant. Following commands: Intact       Cueing       General Comments      Exercises     Assessment/Plan    PT Assessment Patient needs continued PT services  PT Problem List Decreased strength;Decreased range of motion;Impaired sensation;Decreased activity tolerance;Decreased knowledge of use of DME;Decreased balance;Decreased safety awareness;Decreased mobility;Decreased skin integrity;Pain       PT Treatment Interventions DME instruction;Gait training;Functional mobility training;Therapeutic activities;Patient/family education;Therapeutic exercise    PT Goals (Current goals can be found in the Care Plan section)  Acute Rehab PT Goals Patient Stated Goal: go home to my  dog PT Goal Formulation: With patient Time For Goal Achievement: 07/05/23 Potential to Achieve Goals: Fair    Frequency Min 1X/week     Co-evaluation PT/OT/SLP Co-Evaluation/Treatment: Yes Reason for Co-Treatment: To address functional/ADL transfers PT goals addressed during session: Mobility/safety with mobility OT goals addressed during session: ADL's and self-care       AM-PAC PT "6 Clicks" Mobility  Outcome Measure Help needed turning from your back to your side while in a flat bed without using bedrails?: A Lot Help needed moving from lying on your back to sitting on the side of a flat bed without using  bedrails?: A Lot Help needed moving to and from a bed to a chair (including a wheelchair)?: A Lot Help needed standing up from a chair using your arms (e.g., wheelchair or bedside chair)?: A Lot Help needed to walk in hospital room?: Total Help needed climbing 3-5 steps with a railing? : Total 6 Click Score: 10    End of Session   Activity Tolerance: Patient tolerated treatment well Patient left: in bed;with bed alarm set Nurse Communication: Mobility status PT Visit Diagnosis: Unsteadiness on feet (R26.81)    Time: 4098-1191 PT Time Calculation (min) (ACUTE ONLY): 27 min   Charges:   PT Evaluation $PT Eval Low Complexity: 1 Low   PT General Charges $$ ACUTE PT VISIT: 1 Visit         Blanchard Kelch PT Acute Rehabilitation Services Office [phone] Weekend [phone]   Rada Hay 06/21/2023, 2:13 PM

## 2023-06-21 NOTE — Evaluation (Signed)
 Occupational Therapy Evaluation Patient Details Name: Dustin Duffy MRN: [id number] DOB: January 01, 1956 Today's Date: 06/21/2023   History of Present Illness   Patient is a 68 year old male who presented after a fall at home resulting in L shoulder pain. Patient was admitted with UTI, proximal L humerus fracture, sinus tachycardia. Patient is pending shoulder surgery at this time.   PMH: urology 04/28/22 with UTI, DM II, HLD, BPH,T12 wedge compression fracture, legally blind,     Clinical Impressions Patient is a 68 year old male who was admitted for above. Patient was living at home independently with dog and PRN support from friend. Currently, patient is TD for ADLs with limited LUE and h/o visual deficits. Patient is pending surgical interventions at this time for LUE fracture. Patient is not at a level to be able to transition home alone with limited support. Patient will benefit from continued inpatient follow up therapy, <3 hours/day.      If plan is discharge home, recommend the following:   Two people to help with walking and/or transfers;A lot of help with bathing/dressing/bathroom;Assistance with cooking/housework;Direct supervision/assist for medications management;Assist for transportation;Help with stairs or ramp for entrance;Assistance with feeding;Direct supervision/assist for financial management     Functional Status Assessment   Patient has had a recent decline in their functional status and demonstrates the ability to make significant improvements in function in a reasonable and predictable amount of time.     Equipment Recommendations   None recommended by OT      Precautions/Restrictions   Precautions Precautions: Fall Required Braces or Orthoses: Sling Restrictions Weight Bearing Restrictions Per Provider Order: Yes LUE Weight Bearing Per Provider Order: Non weight bearing     Mobility Bed Mobility Overal bed mobility: Needs Assistance Bed Mobility:  Supine to Sit, Sit to Supine     Supine to sit: +2 for physical assistance, +2 for safety/equipment, Max assist Sit to supine: Max assist, +2 for physical assistance, +2 for safety/equipment             Balance Overall balance assessment: Needs assistance Sitting-balance support: Single extremity supported, Feet supported Sitting balance-Leahy Scale: Fair     Standing balance support: During functional activity, Single extremity supported Standing balance-Leahy Scale: Poor                             ADL either performed or assessed with clinical judgement   ADL Overall ADL's : Needs assistance/impaired Eating/Feeding: Moderate assistance Eating/Feeding Details (indicate cue type and reason): patient reported that he is unable to feed himself with R hand. patient reported "i will just eat soft foods when i get home to make it easy" patient was able to pick up cup on table and take sips with no spillage. Grooming: Sitting;Moderate assistance   Upper Body Bathing: Sitting;Total assistance Upper Body Bathing Details (indicate cue type and reason): sitting EOB with increased time and education on proper positioning. Lower Body Bathing: Bed level;Total assistance   Upper Body Dressing : Bed level;Total assistance Upper Body Dressing Details (indicate cue type and reason): sitting EOB with increased time and education on proper positioning. Lower Body Dressing: Bed level;Total assistance   Toilet Transfer: +2 for physical assistance;+2 for safety/equipment;Moderate assistance Toilet Transfer Details (indicate cue type and reason): to take small steps on side of bed in ED with patient very unsteady. used RW during last admission but reported that he has been using SPC in L hand  since d/c. Toileting- Clothing Manipulation and Hygiene: Bed level;Total assistance         General ADL Comments: sling TD to adjust. ed on pillow placement to support arm     Vision  Baseline Vision/History: 2 Legally blind Additional Comments: has baseline visual deficits. patient reported "being able to see therapist but not being able to recognize in community" unable to further expand upon visual abilities even with pointed questions.            Pertinent Vitals/Pain Pain Assessment Pain Assessment: Faces Faces Pain Scale: Hurts even more Pain Location: L shoulder Pain Descriptors / Indicators: Grimacing, Guarding     Extremity/Trunk Assessment Upper Extremity Assessment Upper Extremity Assessment: Left hand dominant;LUE deficits/detail LUE Deficits / Details: humeral fracture. patient TD to adjust sling. LUE: Unable to fully assess due to pain LUE Coordination: decreased fine motor;decreased gross motor   Lower Extremity Assessment Lower Extremity Assessment: Defer to PT evaluation       Communication Communication Communication: No apparent difficulties   Cognition Arousal: Alert Behavior During Therapy: Flat affect Cognition: Cognition impaired     Awareness: Intellectual awareness impaired, Online awareness impaired   Attention impairment (select first level of impairment): Selective attention   OT - Cognition Comments: patient has decreased insight to deficits at baseline.                 Following commands: Intact                  Home Living Family/patient expects to be discharged to:: Private residence Living Arrangements: Alone Available Help at Discharge: Family;Friend(s);Available PRN/intermittently Type of Home: House Home Access: Stairs to enter [employer] of Steps: 3+1 Entrance Stairs-Rails: Right;Left;Can reach both Home Layout: One level     Bathroom Shower/Tub: Producer, television/film/video: Handicapped height Bathroom Accessibility: Yes   Home Equipment: Agricultural consultant (2 wheels);Rollator (4 wheels)   Additional Comments: reported having a cane at home      Prior  Functioning/Environment Prior Level of Function : Independent/Modified Independent             Mobility Comments: pt reports use of rollator or SPC ADLs Comments: takes care of small dog at home, friend assists with IADLs and med management    OT Problem List: Impaired balance (sitting and/or standing);Decreased cognition;Decreased knowledge of precautions;Pain;Increased edema;Impaired UE functional use;Decreased knowledge of use of DME or AE;Decreased activity tolerance;Decreased coordination;Decreased range of motion;Cardiopulmonary status limiting activity;Decreased safety awareness   OT Treatment/Interventions: Self-care/ADL training;DME and/or AE instruction;Therapeutic activities;Balance training;Therapeutic exercise;Energy conservation;Patient/family education      OT Goals(Current goals can be found in the care plan section)   Acute Rehab OT Goals OT Goal Formulation: With patient Time For Goal Achievement: 07/05/23 Potential to Achieve Goals: Fair   OT Frequency:  Min 1X/week    Co-evaluation PT/OT/SLP Co-Evaluation/Treatment: Yes Reason for Co-Treatment: To address functional/ADL transfers PT goals addressed during session: Mobility/safety with mobility OT goals addressed during session: ADL's and self-care      AM-PAC OT "6 Clicks" Daily Activity     Outcome Measure Help from another person eating meals?: A Little Help from another person taking care of personal grooming?: A Lot Help from another person toileting, which includes using toliet, bedpan, or urinal?: Total Help from another person bathing (including washing, rinsing, drying)?: Total Help from another person to put on and taking off regular upper body clothing?: Total Help from another person to put on and taking off  regular lower body clothing?: Total 6 Click Score: 9   End of Session Equipment Utilized During Treatment: Gait belt;Other (comment) (sling) Nurse Communication: Mobility  status  Activity Tolerance: Patient tolerated treatment well Patient left: in bed;with call bell/phone within reach (in ED)  OT Visit Diagnosis: Unsteadiness on feet (R26.81);Other abnormalities of gait and mobility (R26.89);Pain Pain - Right/Left: Left Pain - part of body: Shoulder                Time: 1610-9604 OT Time Calculation (min): 22 min Charges:  OT General Charges $OT Visit: 1 Visit OT Evaluation $OT Eval Moderate Complexity: 1 Mod  Hadas Jessop OTR/L, MS Acute Rehabilitation Department Office# [phone]   Selinda Flavin 06/21/2023, 1:19 PM

## 2023-06-21 NOTE — TOC Initial Note (Signed)
 Transition of Care [facility]) - Initial/Assessment Note    Patient Details  Name: Dustin Duffy MRN: [id number] Date of Birth: 26-Dec-1955  Transition of Care [facility]) CM/SW Contact:    Lanier Clam, RN Phone Number: 06/21/2023, 1:30 PM  Clinical Narrative: d/c plan home. Will confirm [employer] rep Haywood Lasso is providing HHC & if active-await response.                  Expected Discharge Plan: Home w Home Health Services Barriers to Discharge: Continued Medical Work up   Patient Goals and CMS Choice Patient states their goals for this hospitalization and ongoing recovery are:: Home CMS Medicare.gov Compare Post Acute Care list provided to:: Patient Choice offered to / list presented to : Patient [place] ownership interest in [facility].provided to:: Patient    Expected Discharge Plan and Services                                              Prior Living Arrangements/Services                       Activities of Daily Living   ADL Screening (condition at time of admission) Independently performs ADLs?: Yes (appropriate for developmental age) Is the patient deaf or have difficulty hearing?: No Does the patient have difficulty seeing, even when wearing glasses/contacts?: Yes (optic nerve atrophy) Does the patient have difficulty concentrating, remembering, or making decisions?: No  Permission Sought/Granted                  Emotional Assessment              Admission diagnosis:  UTI (urinary tract infection) [N39.0] Weakness [R53.1] Acute UTI [N39.0] Sepsis secondary to UTI (HCC) [A41.9, N39.0] Fall, initial encounter [W19.XXXA] Urinary tract infection with hematuria, site unspecified [N39.0, R31.9] Closed fracture of proximal end of left humerus, unspecified fracture morphology, initial encounter [S42.202A] Patient Active Problem List   Diagnosis Date Noted   UTI (urinary tract infection) 06/21/2023   Acute UTI 06/20/2023   Sepsis  secondary to UTI (HCC) 06/20/2023   Complicated UTI (urinary tract infection) 04/14/2023   Falls frequently 06/06/2022   Acute kidney injury (HCC) 04/29/2022   BPH with urinary obstruction 04/29/2022   Foley catheter in place prior to arrival 04/29/2022   Frequent falls 04/29/2022   Multiple fractures of ribs, left side, initial encounter for closed fracture 04/29/2022   Sacral fracture, closed (HCC) 04/29/2022   Closed wedge compression fracture of T12 vertebra (HCC) 04/29/2022   Diabetes mellitus type 2 in nonobese (HCC) 02/09/2021   Dyslipidemia 02/09/2021   Essential hypertension 02/09/2021   Renal impairment 02/09/2021   Non-traumatic rhabdomyolysis 02/09/2021   Visual impairment 02/09/2021   PCP:  Irena Reichmann, DO Pharmacy:   [facility] [phone] [place], [place] - [address] AT [facility] [address] [place] [address] Phone: [phone] Fax: [phone]     Social Drivers of Health (SDOH) Social History: SDOH Screenings   Food Insecurity: No Food Insecurity (06/21/2023)  Housing: Low Risk  (06/21/2023)  Transportation Needs: Unmet Transportation Needs (06/21/2023)  Utilities: Not At Risk (06/21/2023)  Depression (PHQ2-9): Low Risk  (04/02/2021)  Social Connections: Socially Isolated (06/21/2023)  Tobacco Use: Medium Risk (04/12/2023)   SDOH Interventions:     Readmission Risk Interventions  05/02/2022   10:09 AM  Readmission Risk Prevention Plan  Transportation Screening Complete  PCP or Specialist Appt within 5-7 Days Complete  Home Care Screening Complete  Medication Review (RN CM) Complete

## 2023-06-22 ENCOUNTER — Encounter (HOSPITAL_COMMUNITY): Payer: Self-pay | Admitting: Internal Medicine

## 2023-06-22 ENCOUNTER — Encounter (HOSPITAL_COMMUNITY)
Admission: EM | Disposition: A | Discharge status: Home Health | Payer: Self-pay | Source: Home / Self Care | Attending: Internal Medicine

## 2023-06-22 ENCOUNTER — Inpatient Hospital Stay (HOSPITAL_COMMUNITY): Admitting: Anesthesiology

## 2023-06-22 ENCOUNTER — Inpatient Hospital Stay (HOSPITAL_COMMUNITY)

## 2023-06-22 ENCOUNTER — Other Ambulatory Visit: Payer: Self-pay

## 2023-06-22 DIAGNOSIS — S42202A Unspecified fracture of upper end of left humerus, initial encounter for closed fracture: Secondary | ICD-10-CM | POA: Diagnosis not present

## 2023-06-22 DIAGNOSIS — A419 Sepsis, unspecified organism: Secondary | ICD-10-CM | POA: Diagnosis not present

## 2023-06-22 DIAGNOSIS — N390 Urinary tract infection, site not specified: Secondary | ICD-10-CM | POA: Diagnosis not present

## 2023-06-22 HISTORY — PX: ORIF HUMERUS FRACTURE: SHX2126

## 2023-06-22 LAB — GLUCOSE, CAPILLARY
Glucose-Capillary: 124 mg/dL — ABNORMAL HIGH (ref 70–99)
Glucose-Capillary: 115 mg/dL — ABNORMAL HIGH (ref 70–99)
Glucose-Capillary: 356 mg/dL — ABNORMAL HIGH (ref 70–99)
Glucose-Capillary: 348 mg/dL — ABNORMAL HIGH (ref 70–99)

## 2023-06-22 LAB — CBC
HCT: 32.8 % — ABNORMAL LOW (ref 39.0–52.0)
Hemoglobin: 9.9 g/dL — ABNORMAL LOW (ref 13.0–17.0)
MCH: 26.5 pg (ref 26.0–34.0)
MCHC: 30.2 g/dL (ref 30.0–36.0)
MCV: 87.7 fL (ref 80.0–100.0)
Platelets: 207 10*3/uL (ref 150–400)
RBC: 3.74 MIL/uL — ABNORMAL LOW (ref 4.22–5.81)
RDW: 15 % (ref 11.5–15.5)
WBC: 7.7 10*3/uL (ref 4.0–10.5)
nRBC: 0 % (ref 0.0–0.2)

## 2023-06-22 LAB — COMPREHENSIVE METABOLIC PANEL WITH GFR
ALT: 13 U/L (ref 0–44)
AST: 13 U/L — ABNORMAL LOW (ref 15–41)
Albumin: 2.1 g/dL — ABNORMAL LOW (ref 3.5–5.0)
Alkaline Phosphatase: 52 U/L (ref 38–126)
Anion gap: 8 (ref 5–15)
BUN: 25 mg/dL — ABNORMAL HIGH (ref 8–23)
CO2: 28 mmol/L (ref 22–32)
Calcium: 8.3 mg/dL — ABNORMAL LOW (ref 8.9–10.3)
Chloride: 100 mmol/L (ref 98–111)
Creatinine, Ser: 0.97 mg/dL (ref 0.61–1.24)
GFR, Estimated: >60 mL/min (ref >60)
Glucose, Bld: 167 mg/dL — ABNORMAL HIGH (ref 70–99)
Potassium: 3.7 mmol/L (ref 3.5–5.1)
Sodium: 136 mmol/L (ref 135–145)
Total Bilirubin: 0.6 mg/dL (ref 0.0–1.2)
Total Protein: 5 g/dL — ABNORMAL LOW (ref 6.5–8.1)

## 2023-06-22 LAB — MAGNESIUM: Magnesium: 1.6 mg/dL — ABNORMAL LOW (ref 1.7–2.4)

## 2023-06-22 LAB — PHOSPHORUS: Phosphorus: 3.4 mg/dL (ref 2.5–4.6)

## 2023-06-22 SURGERY — OPEN REDUCTION INTERNAL FIXATION (ORIF) PROXIMAL HUMERUS FRACTURE
Anesthesia: Regional | Laterality: Left

## 2023-06-22 MED ORDER — METOCLOPRAMIDE HCL 10 MG PO TABS: 5.0000 mg | ORAL_TABLET | Freq: Three times a day (TID) | ORAL | Status: DC | PRN

## 2023-06-22 MED ORDER — PHENYLEPHRINE HCL-NACL 20-0.9 MG/250ML-% IV SOLN
INTRAVENOUS | Status: DC | PRN
  Administered 2023-06-22: 50 ug/min via INTRAVENOUS

## 2023-06-22 MED ORDER — INSULIN ASPART 100 UNIT/ML IJ SOLN: 0.0000 [IU] | INTRAMUSCULAR | Status: DC | PRN

## 2023-06-22 MED ORDER — ACETAMINOPHEN 500 MG PO TABS
1000.0000 mg | ORAL_TABLET | Freq: Four times a day (QID) | ORAL | Status: AC
  Administered 2023-06-22 – 2023-06-23 (×3): 1000 mg via ORAL
  Filled 2023-06-22 (×3): qty 2

## 2023-06-22 MED ORDER — ACETAMINOPHEN 325 MG PO TABS: 325.0000 mg | ORAL_TABLET | Freq: Four times a day (QID) | ORAL | Status: DC | PRN

## 2023-06-22 MED ORDER — BISACODYL 10 MG RE SUPP: 10.0000 mg | Freq: Every day | RECTAL | Status: DC | PRN

## 2023-06-22 MED ORDER — ALBUMIN HUMAN 5 % IV SOLN
INTRAVENOUS | Status: AC
  Filled 2023-06-22: qty 250

## 2023-06-22 MED ORDER — OXYCODONE HCL 5 MG PO TABS: 5.0000 mg | ORAL_TABLET | ORAL | 0 refills | Status: DC | PRN

## 2023-06-22 MED ORDER — ONDANSETRON HCL 4 MG/2ML IJ SOLN
INTRAMUSCULAR | Status: DC | PRN
  Administered 2023-06-22: 4 mg via INTRAVENOUS

## 2023-06-22 MED ORDER — BUPIVACAINE HCL (PF) 0.5 % IJ SOLN
INTRAMUSCULAR | Status: DC | PRN
Start: 2023-06-22 — End: 2023-06-22
  Administered 2023-06-22: 10 mL

## 2023-06-22 MED ORDER — DOCUSATE SODIUM 100 MG PO CAPS
100.0000 mg | ORAL_CAPSULE | Freq: Two times a day (BID) | ORAL | Status: DC
  Administered 2023-06-22 – 2023-06-26 (×4): 100 mg via ORAL
  Filled 2023-06-22 (×8): qty 1

## 2023-06-22 MED ORDER — MAGNESIUM SULFATE 2 GM/50ML IV SOLN
2.0000 g | Freq: Once | INTRAVENOUS | Status: AC
  Administered 2023-06-22: 2 g via INTRAVENOUS
  Filled 2023-06-22: qty 50

## 2023-06-22 MED ORDER — CARMEX CLASSIC LIP BALM EX OINT
TOPICAL_OINTMENT | CUTANEOUS | Status: DC | PRN
  Filled 2023-06-22: qty 10

## 2023-06-22 MED ORDER — CHLORHEXIDINE GLUCONATE 0.12 % MT SOLN
15.0000 mL | Freq: Once | OROMUCOSAL | Status: AC
  Administered 2023-06-22: 15 mL via OROMUCOSAL

## 2023-06-22 MED ORDER — LIDOCAINE HCL (PF) 2 % IJ SOLN
INTRAMUSCULAR | Status: DC | PRN
  Administered 2023-06-22: 60 mg via INTRADERMAL

## 2023-06-22 MED ORDER — PROPOFOL 10 MG/ML IV BOLUS
INTRAVENOUS | Status: DC | PRN
  Administered 2023-06-22: 100 mg via INTRAVENOUS

## 2023-06-22 MED ORDER — BUPIVACAINE LIPOSOME 1.3 % IJ SUSP
INTRAMUSCULAR | Status: DC | PRN
  Administered 2023-06-22: 10 mL

## 2023-06-22 MED ORDER — FENTANYL CITRATE (PF) 100 MCG/2ML IJ SOLN
INTRAMUSCULAR | Status: AC
  Filled 2023-06-22: qty 2

## 2023-06-22 MED ORDER — ONDANSETRON HCL 4 MG/2ML IJ SOLN: 4.0000 mg | Freq: Four times a day (QID) | INTRAMUSCULAR | Status: DC | PRN

## 2023-06-22 MED ORDER — OXYCODONE HCL 5 MG PO TABS
5.0000 mg | ORAL_TABLET | ORAL | Status: DC | PRN
  Administered 2023-06-22 – 2023-06-24 (×3): 5 mg via ORAL
  Filled 2023-06-22 (×2): qty 2
  Filled 2023-06-22 (×3): qty 1

## 2023-06-22 MED ORDER — METOCLOPRAMIDE HCL 5 MG/ML IJ SOLN: 5.0000 mg | Freq: Three times a day (TID) | INTRAMUSCULAR | Status: DC | PRN

## 2023-06-22 MED ORDER — LACTATED RINGERS IV SOLN: INTRAVENOUS | Status: DC | PRN

## 2023-06-22 MED ORDER — LACTATED RINGERS IV SOLN: INTRAVENOUS | Status: DC

## 2023-06-22 MED ORDER — ROCURONIUM BROMIDE 10 MG/ML (PF) SYRINGE
PREFILLED_SYRINGE | INTRAVENOUS | Status: AC
Start: 2023-06-22 — End: ?
  Filled 2023-06-22: qty 10

## 2023-06-22 MED ORDER — METHOCARBAMOL 500 MG PO TABS: 500.0000 mg | ORAL_TABLET | Freq: Four times a day (QID) | ORAL | Status: DC | PRN

## 2023-06-22 MED ORDER — METHOCARBAMOL 1000 MG/10ML IJ SOLN
500.0000 mg | Freq: Four times a day (QID) | INTRAMUSCULAR | Status: DC | PRN
  Administered 2023-06-22 – 2023-06-23 (×2): 500 mg via INTRAVENOUS
  Filled 2023-06-22 (×2): qty 10

## 2023-06-22 MED ORDER — HYDROMORPHONE HCL 1 MG/ML IJ SOLN
0.5000 mg | INTRAMUSCULAR | Status: DC | PRN
  Administered 2023-06-26: 1 mg via INTRAVENOUS
  Filled 2023-06-22: qty 1

## 2023-06-22 MED ORDER — MIDAZOLAM HCL 5 MG/5ML IJ SOLN
INTRAMUSCULAR | Status: DC | PRN
  Administered 2023-06-22: .5 mg via INTRAVENOUS

## 2023-06-22 MED ORDER — DEXAMETHASONE SODIUM PHOSPHATE 10 MG/ML IJ SOLN
INTRAMUSCULAR | Status: DC | PRN
  Administered 2023-06-22: 5 mg via INTRAVENOUS

## 2023-06-22 MED ORDER — ALUM & MAG HYDROXIDE-SIMETH 200-200-20 MG/5ML PO SUSP: 30.0000 mL | ORAL | Status: DC | PRN

## 2023-06-22 MED ORDER — PHENYLEPHRINE 80 MCG/ML (10ML) SYRINGE FOR IV PUSH (FOR BLOOD PRESSURE SUPPORT)
PREFILLED_SYRINGE | INTRAVENOUS | Status: DC | PRN
  Administered 2023-06-22: 240 ug via INTRAVENOUS
  Administered 2023-06-22: 160 ug via INTRAVENOUS
  Administered 2023-06-22: 240 ug via INTRAVENOUS
  Administered 2023-06-22: 160 ug via INTRAVENOUS

## 2023-06-22 MED ORDER — ALBUMIN HUMAN 5 % IV SOLN: INTRAVENOUS | Status: DC | PRN

## 2023-06-22 MED ORDER — MAGNESIUM CITRATE PO SOLN: 1.0000 | Freq: Once | ORAL | Status: DC | PRN

## 2023-06-22 MED ORDER — FENTANYL CITRATE (PF) 100 MCG/2ML IJ SOLN
INTRAMUSCULAR | Status: DC | PRN
  Administered 2023-06-22: 50 ug via INTRAVENOUS

## 2023-06-22 MED ORDER — LIDOCAINE HCL (PF) 2 % IJ SOLN
INTRAMUSCULAR | Status: AC
  Filled 2023-06-22: qty 5

## 2023-06-22 MED ORDER — MENTHOL 3 MG MT LOZG: 1.0000 | LOZENGE | OROMUCOSAL | Status: DC | PRN

## 2023-06-22 MED ORDER — SUGAMMADEX SODIUM 200 MG/2ML IV SOLN
INTRAVENOUS | Status: DC | PRN
  Administered 2023-06-22: 50 mg via INTRAVENOUS
  Administered 2023-06-22: 150 mg via INTRAVENOUS

## 2023-06-22 MED ORDER — DEXAMETHASONE SODIUM PHOSPHATE 10 MG/ML IJ SOLN
INTRAMUSCULAR | Status: AC
  Filled 2023-06-22: qty 1

## 2023-06-22 MED ORDER — PROPOFOL 10 MG/ML IV BOLUS
INTRAVENOUS | Status: AC
  Filled 2023-06-22: qty 20

## 2023-06-22 MED ORDER — DIPHENHYDRAMINE HCL 12.5 MG/5ML PO ELIX: 12.5000 mg | ORAL_SOLUTION | ORAL | Status: DC | PRN

## 2023-06-22 MED ORDER — ONDANSETRON HCL 4 MG/2ML IJ SOLN
INTRAMUSCULAR | Status: AC
  Filled 2023-06-22: qty 2

## 2023-06-22 MED ORDER — MIDAZOLAM HCL 2 MG/2ML IJ SOLN
INTRAMUSCULAR | Status: AC
  Filled 2023-06-22: qty 2

## 2023-06-22 MED ORDER — OXYCODONE HCL 5 MG PO TABS
10.0000 mg | ORAL_TABLET | ORAL | Status: DC | PRN
  Administered 2023-06-23 – 2023-06-24 (×2): 10 mg via ORAL
  Filled 2023-06-22: qty 2

## 2023-06-22 MED ORDER — ONDANSETRON HCL 4 MG PO TABS: 4.0000 mg | ORAL_TABLET | Freq: Four times a day (QID) | ORAL | Status: DC | PRN

## 2023-06-22 MED ORDER — PHENOL 1.4 % MT LIQD: 1.0000 | OROMUCOSAL | Status: DC | PRN

## 2023-06-22 MED ORDER — POTASSIUM CHLORIDE IN NACL 20-0.9 MEQ/L-% IV SOLN
INTRAVENOUS | Status: AC
  Filled 2023-06-22 (×2): qty 1000

## 2023-06-22 MED ORDER — ROCURONIUM BROMIDE 10 MG/ML (PF) SYRINGE
PREFILLED_SYRINGE | INTRAVENOUS | Status: DC | PRN
  Administered 2023-06-22: 10 mg via INTRAVENOUS
  Administered 2023-06-22: 50 mg via INTRAVENOUS

## 2023-06-22 SURGICAL SUPPLY — 69 items
BAG COUNTER SPONGE SURGICOUNT (BAG) IMPLANT
BENZOIN TINCTURE PRP APPL 2/3 (GAUZE/BANDAGES/DRESSINGS) ×2 IMPLANT
BIT DRILL 3.2XCALB NS DISP (BIT) IMPLANT
BIT DRILL CALIBRATED 2.7 (BIT) IMPLANT
BIT DRL 3.2XCALB NS DISP (BIT) ×1 IMPLANT
BNDG COHESIVE 4X5 TAN STRL (GAUZE/BANDAGES/DRESSINGS) IMPLANT
BNDG GAUZE DERMACEA FLUFF 4 (GAUZE/BANDAGES/DRESSINGS) ×2 IMPLANT
COVER MAYO STAND STRL (DRAPES) IMPLANT
COVER SURGICAL LIGHT HANDLE (MISCELLANEOUS) ×1 IMPLANT
DRAPE C-ARM 42X120 X-RAY (DRAPES) ×1 IMPLANT
DRAPE INCISE IOBAN 66X45 STRL (DRAPES) IMPLANT
DRAPE SURG 17X11 SM STRL (DRAPES) ×2 IMPLANT
DRAPE U-SHAPE 47X51 STRL (DRAPES) ×1 IMPLANT
DRSG ADAPTIC 3X8 NADH LF (GAUZE/BANDAGES/DRESSINGS) ×1 IMPLANT
DRSG MEPILEX POST OP 4X8 (GAUZE/BANDAGES/DRESSINGS) ×1 IMPLANT
DURAPREP 26ML APPLICATOR (WOUND CARE) IMPLANT
ELECT REM PT RETURN 15FT ADLT (MISCELLANEOUS) ×1 IMPLANT
EVACUATOR 1/8 PVC DRAIN (DRAIN) IMPLANT
FACESHIELD WRAPAROUND (MASK) ×2 IMPLANT
FACESHIELD WRAPAROUND OR TEAM (MASK) IMPLANT
GAUZE PAD ABD 8X10 STRL (GAUZE/BANDAGES/DRESSINGS) ×2 IMPLANT
GAUZE SPONGE 4X4 12PLY STRL (GAUZE/BANDAGES/DRESSINGS) ×2 IMPLANT
GLOVE BIO SURGEON STRL SZ 6.5 (GLOVE) IMPLANT
GLOVE BIO SURGEON STRL SZ7.5 (GLOVE) ×1 IMPLANT
GLOVE BIOGEL PI IND STRL 7.0 (GLOVE) IMPLANT
GLOVE BIOGEL PI IND STRL 8 (GLOVE) ×1 IMPLANT
GOWN SRG XL LVL 4 BRTHBL STRL (GOWNS) IMPLANT
GOWN STRL REUS W/ TWL LRG LVL3 (GOWN DISPOSABLE) ×2 IMPLANT
K-WIRE 2X5 SS THRDED S3 (WIRE) ×3 IMPLANT
KIT BASIN OR (CUSTOM PROCEDURE TRAY) ×1 IMPLANT
KIT TURNOVER KIT A (KITS) IMPLANT
KWIRE 2X5 SS THRDED S3 (WIRE) IMPLANT
MANIFOLD NEPTUNE II (INSTRUMENTS) ×1 IMPLANT
NS IRRIG 1000ML POUR BTL (IV SOLUTION) ×1 IMPLANT
PACK ORTHO EXTREMITY (CUSTOM PROCEDURE TRAY) ×1 IMPLANT
PACK SHOULDER (CUSTOM PROCEDURE TRAY) IMPLANT
PEG LOCKING 3.2MMX46 (Peg) IMPLANT
PEG LOCKING 3.2MMX54 (Peg) IMPLANT
PEG LOCKING 3.2X32 (Peg) IMPLANT
PEG LOCKING 3.2X38 (Screw) IMPLANT
PEG LOCKING 3.2X42 (Screw) IMPLANT
PENCIL SMOKE EVACUATOR (MISCELLANEOUS) IMPLANT
PLATE HUMERUS LP PROX L 3H (Plate) IMPLANT
PROTECTOR NERVE ULNAR (MISCELLANEOUS) ×1 IMPLANT
RESTRAINT HEAD UNIVERSAL NS (MISCELLANEOUS) IMPLANT
SCREW LOCK CORT STAR 3.5X24 (Screw) IMPLANT
SCREW LOCK CORT STAR 3.5X26 (Screw) IMPLANT
SCREW LP NL T15 3.5X26 (Screw) IMPLANT
SLEEVE MEASURING 3.2 (BIT) IMPLANT
SLING ARM IMMOBILIZER LRG (SOFTGOODS) ×1 IMPLANT
SLING ARM IMMOBILIZER MED (SOFTGOODS) IMPLANT
SOL PREP POV-IOD 4OZ 10% (MISCELLANEOUS) ×1 IMPLANT
SOL SCRUB PVP POV-IOD 4OZ 7.5% (MISCELLANEOUS) IMPLANT
SOLUTION SCRB POV-IOD 4OZ 7.5% (MISCELLANEOUS) ×1 IMPLANT
SPONGE T-LAP 18X18 ~~LOC~~+RFID (SPONGE) IMPLANT
STAPLER SKIN PROX WIDE 3.9 (STAPLE) IMPLANT
STOCKINETTE 8 INCH (MISCELLANEOUS) ×1 IMPLANT
STRIP CLOSURE SKIN 1/2X4 (GAUZE/BANDAGES/DRESSINGS) ×2 IMPLANT
SUCTION TUBE FRAZIER 10FR DISP (SUCTIONS) ×1 IMPLANT
SUPPORT WRAP ARM LG (MISCELLANEOUS) IMPLANT
SUT ETHIBOND 2 0 SH 36X2 (SUTURE) ×1 IMPLANT
SUT MAXBRAID #2 CVD NDL (SUTURE) IMPLANT
SUT MNCRL AB 4-0 PS2 18 (SUTURE) ×1 IMPLANT
SUT VIC AB 0 CT1 36 (SUTURE) ×1 IMPLANT
SUT VIC AB 3-0 SH 18 (SUTURE) ×1 IMPLANT
SUT VIC AB 4-0 PS2 27 (SUTURE) ×1 IMPLANT
TOWEL OR 17X26 10 PK STRL BLUE (TOWEL DISPOSABLE) ×2 IMPLANT
TRAY FOLEY MTR SLVR 16FR STAT (SET/KITS/TRAYS/PACK) IMPLANT
WATER STERILE IRR 1000ML POUR (IV SOLUTION) ×1 IMPLANT

## 2023-06-22 NOTE — Anesthesia Preprocedure Evaluation (Addendum)
 Anesthesia Evaluation  Patient identified by MRN, date of birth, ID band Patient awake    Reviewed: Allergy & Precautions, H&P , NPO status , Patient's Chart, lab work & pertinent test results  Airway Mallampati: II  TM Distance: >3 FB Neck ROM: Full    Dental no notable dental hx.    Pulmonary former smoker   Pulmonary exam normal breath sounds clear to auscultation       Cardiovascular hypertension, Normal cardiovascular exam Rhythm:Regular Rate:Normal     Neuro/Psych negative neurological ROS  negative psych ROS   GI/Hepatic negative GI ROS, Neg liver ROS,,,  Endo/Other  diabetes, Type 2    Renal/GU negative Renal ROS  negative genitourinary   Musculoskeletal negative musculoskeletal ROS (+)    Abdominal   Peds negative pediatric ROS (+)  Hematology negative hematology ROS (+)   Anesthesia Other Findings   Reproductive/Obstetrics negative OB ROS                              Anesthesia Physical Anesthesia Plan  ASA: 2  Anesthesia Plan: General and Regional   Post-op Pain Management: Regional block* and Tylenol PO (pre-op)*   Induction: Intravenous  PONV Risk Score and Plan: 2 and Ondansetron and Dexamethasone  Airway Management Planned: Oral ETT  Additional Equipment: None  Intra-op Plan:   Post-operative Plan: Extubation in OR  Informed Consent: I have reviewed the patients History and Physical, chart, labs and discussed the procedure including the risks, benefits and alternatives for the proposed anesthesia with the patient or authorized representative who has indicated his/her understanding and acceptance.     Dental advisory given  Plan Discussed with: CRNA  Anesthesia Plan Comments:          Anesthesia Quick Evaluation

## 2023-06-22 NOTE — Discharge Instructions (Signed)
 Diet: As you were doing prior to hospitalization   Shower/Dressing:  May shower but keep the wounds dry, use an occlusive plastic wrap, NO SOAKING IN TUB.  If the bandage gets wet, change with a clean dry gauze. There are sticky tapes (steri-strips) on your wounds and all the stitches are absorbable.  Leave the steri-strips in place if changing your dressings, they will peel off with time, usually 2-3 weeks.  Activity:  Increase activity slowly as tolerated, but follow the weight bearing instructions below.  The rules on driving is that you can not be taking narcotics while you drive, and you must feel in control of the vehicle.    Weight Bearing:   No bearing weight with left arm.   To prevent constipation: you may use a stool softener such as -  Colace (over the counter) 100 mg by mouth twice a day  Drink plenty of fluids (prune juice may be helpful) and high fiber foods Miralax (over the counter) for constipation as needed.    Itching:  If you experience itching with your medications, try taking only a single pain pill, or even half a pain pill at a time.  You may take up to 10 pain pills per day, and you can also use benadryl over the counter for itching or also to help with sleep.   Precautions:  If you experience chest pain or shortness of breath - call 911 immediately for transfer to the hospital emergency department!!  If you develop a fever greater that 101 F, purulent drainage from wound, increased redness or drainage from wound, or calf pain -- Call the office at [phone]                                                Follow- Up Appointment:  Please call for an appointment to be seen in 2 weeks [place] - [phone]

## 2023-06-22 NOTE — Transfer of Care (Signed)
 Immediate Anesthesia Transfer of Care Note  Patient: Dustin Duffy  Procedure(s) Performed: OPEN REDUCTION INTERNAL FIXATION (ORIF) PROXIMAL HUMERUS FRACTURE (Left)  Patient Location: PACU  Anesthesia Type:General  Level of Consciousness: awake, alert , and oriented  Airway & Oxygen Therapy: Patient Spontanous Breathing and Patient connected to face mask oxygen  Post-op Assessment: Report given to RN and Post -op Vital signs reviewed and stable  Post vital signs: Reviewed and stable  Last Vitals:  Vitals Value Taken Time  BP 111/71 06/22/23 1734  Temp    Pulse 94 06/22/23 1735  Resp 16   SpO2 99 % 06/22/23 1735  Vitals shown include unfiled device data.  Last Pain:  Vitals:   06/22/23 1426  TempSrc:   PainSc: 0-No pain      Patients Stated Pain Goal: 3 (06/21/23 2024)  Complications: No notable events documented.

## 2023-06-22 NOTE — Anesthesia Procedure Notes (Signed)
 Procedure Name: Intubation Date/Time: 06/22/2023 3:09 PM  Performed by: Doran Clay, CRNAPre-anesthesia Checklist: Patient identified, Emergency Drugs available, Suction available, Patient being monitored and Timeout performed Patient Re-evaluated:Patient Re-evaluated prior to induction Oxygen Delivery Method: Circle system utilized Preoxygenation: Pre-oxygenation with 100% oxygen Induction Type: IV induction Laryngoscope Size: Mac and 4 Grade View: Grade I Tube type: Oral Tube size: 7.5 mm Number of attempts: 1 Airway Equipment and Method: Stylet Placement Confirmation: ETT inserted through vocal cords under direct vision, positive ETCO2 and breath sounds checked- equal and bilateral Secured at: 23 cm Tube secured with: Tape Dental Injury: Teeth and Oropharynx as per pre-operative assessment

## 2023-06-22 NOTE — Progress Notes (Signed)
 The risks benefits and alternatives were discussed with the patient including but not limited to the risks of nonoperative treatment, versus surgical intervention including infection, bleeding, nerve injury, malunion, nonunion, the need for revision surgery, hardware prominence, hardware failure, the need for hardware removal, blood clots, cardiopulmonary complications, morbidity, mortality, among others, and they were willing to proceed.    Plan left proximal humerus ORIF.  Eulas Post, MD

## 2023-06-22 NOTE — Anesthesia Postprocedure Evaluation (Signed)
 Anesthesia Post Note  Patient: Dustin Duffy  Procedure(s) Performed: OPEN REDUCTION INTERNAL FIXATION (ORIF) PROXIMAL HUMERUS FRACTURE (Left)     Patient location during evaluation: PACU Anesthesia Type: Regional and General Level of consciousness: awake and alert Pain management: pain level controlled Vital Signs Assessment: post-procedure vital signs reviewed and stable Respiratory status: spontaneous breathing, nonlabored ventilation, respiratory function stable and patient connected to nasal cannula oxygen Cardiovascular status: blood pressure returned to baseline and stable Postop Assessment: no apparent nausea or vomiting Anesthetic complications: no   No notable events documented.  Last Vitals:  Vitals:   06/22/23 1430 06/22/23 1734  BP: 107/73 111/71  Pulse: 93 95  Resp: 11 12  Temp:  36.4 C  SpO2: 97% 99%    Last Pain:  Vitals:   06/22/23 1734  TempSrc:   PainSc: 0-No pain                 Prince Nation

## 2023-06-22 NOTE — Anesthesia Procedure Notes (Signed)
 Anesthesia Regional Block: Interscalene brachial plexus block   Pre-Anesthetic Checklist: , timeout performed,  Correct Patient, Correct Site, Correct Laterality,  Correct Procedure, Correct Position, site marked,  Risks and benefits discussed,  Surgical consent,  Pre-op evaluation,  At surgeon's request and post-op pain management  Laterality: Left  Prep: chloraprep       Needles:  Injection technique: Single-shot  Needle Type: Echogenic Stimulator Needle     Needle Length: 9cm  Needle Gauge: 21     Additional Needles:   Procedures:,,,, ultrasound used (permanent image in chart),,    Narrative:  Start time: 06/22/2023 2:20 PM End time: 06/22/2023 2:25 PM Injection made incrementally with aspirations every 5 mL.  Performed by: Personally  Anesthesiologist: Kennedy Nation, MD  Additional Notes: Discussed risks and benefits of the nerve block in detail, including but not limited vascular injury, permanent nerve damage and infection.   Patient tolerated the procedure well. Local anesthetic introduced in an incremental fashion under minimal resistance after negative aspirations. No paresthesias were elicited. After completion of the procedure, no acute issues were identified and patient continued to be monitored by RN.

## 2023-06-22 NOTE — Op Note (Signed)
 06/20/2023 - 06/22/2023  4:57 PM  PATIENT:  Dustin Duffy    PRE-OPERATIVE DIAGNOSIS:  Traumatic closed displaced fracture of proximal end of left humerus  POST-OPERATIVE DIAGNOSIS:  Same  PROCEDURE:  OPEN REDUCTION INTERNAL FIXATION (ORIF) PROXIMAL HUMERUS FRACTURE  SURGEON:  Eulas Post, MD  PHYSICIAN ASSISTANT: Janine Ores, PA-C, present and scrubbed throughout the case, critical for completion in a timely fashion, and for retraction, instrumentation, and closure.  ANESTHESIA:   General  ESTIMATED BLOOD LOSS: 150 ml  PREOPERATIVE INDICATIONS:  [name] is a  68 y.o. male with a diagnosis of Traumatic closed displaced fracture of proximal end of left humerus who elected for surgical management.    The risks benefits and alternatives were discussed with the patient including but not limited to the risks of nonoperative treatment, versus surgical intervention including infection, bleeding, nerve injury, malunion, nonunion, the need for revision surgery, hardware prominence, hardware failure, the need for hardware removal, blood clots, cardiopulmonary complications, conversion to arthroplasty, morbidity, mortality, among others, and they were willing to proceed.  Predicted outcome is good, although there will be at least a six to nine month expected recovery.   OPERATIVE IMPLANTS: Biomet ALPS proximal humerus locking plate.  OPERATIVE FINDINGS: Displaced proximal humerus fracture.  UNIQUE ASPECTS OF THE CASE: The humeral head was in 1 piece, and was extremely displaced, and there was some of the humeral shaft that was buttonholed through the deltoid.  I was able to get it reduced anatomically.  The bone quality was poor, it was quite easy to drill bicortically through the humeral head on some of the pegs, so I kept the pegs shorter than the subchondral bone to minimize future risk of subsidence.  OPERATIVE PROCEDURE: The patient was brought to the operating room and placed in the  supine position. General anesthesia was administered. IV antibiotics were given. He was placed in the beach chair position. All bony prominences were padded. The upper extremity was prepped and draped in usual sterile fashion. Deltopectoral incision was performed.  I exposed the fracture site, and placed deep retractors. I did not tenotomize the biceps tendon. This was left in place.  I placed supraspinatus and subscapularis stitches, and then reduced the head onto the shaft. This was maintained in satisfactory position.  I applied the plate and secured it into the sliding hole first. I confirmed position of the reduction and the plate with C-arm, and I placed a total of 2 guidewires into the appropriate position in the head. I was satisfied that the plate was distal appropriately, and then secured the plate proximally with smooth pegs, taking care to prevent penetration into the arch articular surface, using C-arm, as well as manual feel using a hand drill.  I then secured the plate distally using another cortical screw. I then passed the FiberWire sutures from the subscapularis and supraspinatus through the plate and secured the tuberosities. Once complete fixation and reduction of been achieved, took final C-arm pictures, and irrigated the wounds copiously, and repaired the deltopectoral interval with Vicryl followed by Vicryl for the subcutaneous tissue with Monocryl and Steri-Strips for the skin. He was placed in a sling. He had a preoperative regional block as well. He tolerated the procedure well with no complications.

## 2023-06-22 NOTE — Progress Notes (Addendum)
 PROGRESS NOTE  Dustin Duffy:[id number] DOB: 1956/01/19 DOA: 06/20/2023 PCP: Irena Reichmann, DO   LOS: 2 days   Brief Narrative / Interim history: 68 year old male with DM2, BPH with urinary obstruction with chronic Foley catheter, CKD 2, HTN, HLD comes into the hospital after a fall.  He was walking from his kitchen to the living room when all of a sudden his legs gave out and fell on the left side.  He had left shoulder pain afterwards.  He presented to the ER, was found to have a UTI and x-ray of the shoulder showed acute displaced fracture of the proximal left humerus.  Orthopedic surgery was consulted and he was admitted to the hospital  Subjective / 24h Interval events: He is awaiting surgery today.  Denies any chest pain, denies any shortness of breath.  No abdominal discomfort, no nausea or vomiting  Assesement and Plan: Principal problem Sepsis due to UTI -met sepsis criteria with tachycardia, tachypnea, elevated lactic acid, although latter could have been in the setting of recent fall.  Urine culture sent and growing 70^5 yeast, patient was started on ceftriaxone as well as fluconazole, continue for now perioperatively.  Will plan for a total of 5 days -He is afebrile, white count is normal  Active problems Proximal L humerus fracture -orthopedic surgery consulted, plan for operative repair this afternoon   Frequent falls -admitted in January for fall suspected to be 2/2 UTI.  PT/OT consulted, recommendations were for SNF however patient is choosing to go home postoperatively.  Will maximize home health  Sinus tachycardia - Heart rates ranging in the 100s to 130s since arrival to the ED.  EKG showing sinus tachycardia.  Suspect secondary to underlying infection and pain from humerus fracture.  Improved now   Hypertension, history of hypotension - Patient with history of hypotension thought to be 2/2 autonomic dysfunction stemming from T2DM. He is on midodrine 5 mg 3 times daily  at home along with fludrocortisone 0.1mg  daily.  Resume here.  Has been slightly hypertensive in the setting of pain.  Monitor perioperatively   T2DM - On xigduo 10-500mg  daily at home. Question whether SGLT-2i component is leading to recurrent UTIs. Will hold home xigduo. A1c 8.0% in 04/12/2023.  Add sliding scale   HLD -resume home zocor 20mg  qhs   BPH with urinary obstruction s/p chronic indwelling Foley catheter -follows Dr. Alvester Morin, [facility]. Foley catheter switched on arrival to ED. Continue home mirabegron 50mg  daily  Hypomagnesemia-replace magnesium and continue to monitor  CKD Stage 2 -kidney function stable and at baseline  Anemia of chronic disease-hemoglobin stable  Scheduled Meds:  acetaminophen  1,000 mg Oral Once   amitriptyline  50 mg Oral QHS   Chlorhexidine Gluconate Cloth  6 each Topical Daily   fluconazole  200 mg Oral QHS   fludrocortisone  0.1 mg Oral Daily   gabapentin  900 mg Oral QHS   insulin aspart  0-5 Units Subcutaneous QHS   insulin aspart  0-9 Units Subcutaneous TID WC   midodrine  5 mg Oral TID WC   mirabegron ER  50 mg Oral Daily   mupirocin ointment  1 Application Nasal BID   povidone-iodine  2 Application Topical Once   simvastatin  20 mg Oral QHS   Continuous Infusions:  cefTRIAXone (ROCEPHIN)  IV 1 g (06/21/23 1431)   PRN Meds:.acetaminophen **OR** acetaminophen, HYDROmorphone (DILAUDID) injection, oxyCODONE, senna-docusate  Current Outpatient Medications  Medication Instructions   acetaminophen (TYLENOL) 650 mg, Oral, Every  6 hours PRN   amitriptyline (ELAVIL) 50 mg, Oral, Daily at bedtime   aspirin EC 81 mg, Oral, Every morning, Swallow whole.   cholecalciferol (VITAMIN D3) 1,000 Units, Oral, Every M-W-F   fludrocortisone (FLORINEF) 0.1 mg, Oral, Daily   gabapentin (NEURONTIN) 900 mg, Oral, Daily at bedtime   ipratropium (ATROVENT) 0.03 % nasal spray 2 sprays, Each Nare, 2 times daily PRN   Januvia 50 mg, Every morning   midodrine  (PROAMATINE) 5 mg, Oral, 3 times daily with meals   Myrbetriq 50 mg, Oral, Every morning   simvastatin (ZOCOR) 20 mg, Oral, Every evening, Do not take this medication until you finish Diflucan.   Water For Irrigation, Sterile (STERILE WATER) Irrigation, See admin instructions, Mix sterile water with white vinegar 4:1 and flush indwelling catheter nightly before bedtime    XIGDUO XR 10-500 MG TB24 1 tablet, Daily before breakfast    Diet Orders (From admission, onward)     Start     Ordered   06/22/23 0430  Diet NPO time specified  Diet effective ____        06/21/23 1941            DVT prophylaxis: Sequential compression device to OR Start: 06/21/23 1941 Place and maintain sequential compression device Start: 06/20/23 2044   Lab Results  Component Value Date   PLT 207 06/22/2023      Code Status: Full Code  Family Communication: No family at bedside  Status is: Inpatient Remains inpatient appropriate because: severity of illness  Level of care: Telemetry  Consultants:  Orthopedic surgery   Objective: Vitals:   06/21/23 2016 06/22/23 0044 06/22/23 0352 06/22/23 0737  BP: 139/89 (!) 172/99 118/72 (!) 120/58  Pulse: 95 92 (!) 105 97  Resp: 18 19 18 16   Temp: 98.6 F (37 C) 99 F (37.2 C) 98.1 F (36.7 C)   TempSrc: Oral Oral Oral   SpO2: 96% 94% 94%   Weight:      Height:        Intake/Output Summary (Last 24 hours) at 06/22/2023 8295 Last data filed at 06/22/2023 0600 Gross per 24 hour  Intake 860 ml  Output 1075 ml  Net -215 ml   Wt Readings from Last 3 Encounters:  06/21/23 53.7 kg  04/12/23 63 kg  06/06/22 63.5 kg    Examination:  Constitutional: NAD Eyes: lids and conjunctivae normal, no scleral icterus ENMT: mmm Neck: normal, supple Respiratory: clear to auscultation bilaterally, no wheezing, no crackles. Cardiovascular: Regular rate and rhythm, no murmurs / rubs / gallops. No LE edema. Abdomen: soft, no distention, no tenderness. Bowel  sounds positive.     Data Reviewed: I have independently reviewed following labs and imaging studies   CBC Recent Labs  Lab 06/20/23 1316 06/21/23 0504 06/22/23 0521  WBC 8.9 6.6 7.7  HGB 12.9* 10.0* 9.9*  HCT 42.9 32.4* 32.8*  PLT 234 202 207  MCV 86.8 87.1 87.7  MCH 26.1 26.9 26.5  MCHC 30.1 30.9 30.2  RDW 15.3 15.4 15.0  LYMPHSABS 0.7  --   --   MONOABS 0.8  --   --   EOSABS 0.0  --   --   BASOSABS 0.1  --   --     Recent Labs  Lab 06/20/23 1316 06/20/23 1835 06/20/23 2138 06/21/23 0114 06/21/23 0504 06/22/23 0521  NA 141  --   --   --  137 136  K 4.0  --   --   --  4.0 3.7  CL 101  --   --   --  101 100  CO2 32  --   --   --  27 28  GLUCOSE 181*  --   --   --  112* 167*  BUN 20  --   --   --  22 25*  CREATININE 1.18  --   --   --  1.08 0.97  CALCIUM 9.0  --   --   --  8.4* 8.3*  AST  --   --   --   --   --  13*  ALT  --   --   --   --   --  13  ALKPHOS  --   --   --   --   --  52  BILITOT  --   --   --   --   --  0.6  ALBUMIN  --   --   --   --   --  2.1*  MG  --   --   --   --   --  1.6*  LATICACIDVEN  --  >9.0* 1.1 0.9  --   --     ------------------------------------------------------------------------------------------------------------------ No results for input(s): "CHOL", "HDL", "LDLCALC", "TRIG", "CHOLHDL", "LDLDIRECT" in the last 72 hours.  Lab Results  Component Value Date   HGBA1C 8.0 (H) 04/12/2023   ------------------------------------------------------------------------------------------------------------------ No results for input(s): "TSH", "T4TOTAL", "T3FREE", "THYROIDAB" in the last 72 hours.  Invalid input(s): "FREET3"  Cardiac Enzymes No results for input(s): "CKMB", "TROPONINI", "MYOGLOBIN" in the last 168 hours.  Invalid input(s): "CK" ------------------------------------------------------------------------------------------------------------------ No results found for: "BNP"  CBG: Recent Labs  Lab 06/20/23 2217  06/21/23 0848 06/21/23 1216 06/21/23 1608 06/21/23 2111  GLUCAP 145* 87 94 237* 294*    Recent Results (from the past 240 hours)  Urine Culture     Status: Abnormal   Collection Time: 06/20/23 12:14 PM   Specimen: Urine, Random  Result Value Ref Range Status   Specimen Description   Final    URINE, RANDOM Performed at [facility], [address] [place], [place] [address]    Special Requests   Final    NONE Reflexed from [id number] Performed at [facility], [address], [place], [place] [address]    Culture (A)  Final    70,000 COLONIES/mL YEAST Performed at [facility], [address], [place], [place] [address]    Report Status 06/21/2023 FINAL  Final  Culture, blood (Routine X 2) w Reflex to ID Panel     Status: None (Preliminary result)   Collection Time: 06/20/23  8:05 PM   Specimen: BLOOD  Result Value Ref Range Status   Specimen Description   Final    BLOOD BLOOD LEFT HAND Performed at [facility], [address], [place], [place] [address]    Special Requests   Final    BOTTLES DRAWN AEROBIC AND ANAEROBIC Blood Culture results may not be optimal due to an inadequate volume of blood received in culture bottles Performed at [facility], [address], [place], [place] [address]    Culture   Final    NO GROWTH < 12 HOURS Performed at [facility], [address], [place], [place] [address]    Report Status PENDING  Incomplete  Culture, blood (Routine X 2) w Reflex to ID Panel     Status: None (Preliminary result)  Collection Time: 06/20/23  8:05 PM   Specimen: BLOOD  Result Value Ref Range Status   Specimen Description   Final    BLOOD RIGHT ANTECUBITAL Performed at [facility], [address] [place], [place] [address]    Special Requests   Final    BOTTLES DRAWN AEROBIC AND ANAEROBIC Blood Culture results may not be optimal due to an inadequate  volume of blood received in culture bottles Performed at [facility], [address], [place], [place] [address]    Culture   Final    NO GROWTH < 12 HOURS Performed at [facility], [address], [place], [place] [address]    Report Status PENDING  Incomplete  Surgical PCR screen     Status: None   Collection Time: 06/21/23  3:24 PM   Specimen: Nasal Mucosa; Nasal Swab  Result Value Ref Range Status   MRSA, PCR NEGATIVE NEGATIVE Final   Staphylococcus aureus NEGATIVE NEGATIVE Final    Comment: (NOTE) The Xpert SA Assay (FDA approved for NASAL specimens in patients 27 years of age and older), is one component of a comprehensive surveillance program. It is not intended to diagnose infection nor to guide or monitor treatment. Performed at [facility], [address] [place], [place] [address]      Radiology Studies: No results found.    Pamella Pert, MD, PhD [facility]  Between 7 am - 7 pm I am available, please contact me via Amion (for emergencies) or Securechat (non urgent messages)  Between 7 pm - 7 am I am not available, please contact night coverage MD/APP via Amion

## 2023-06-23 ENCOUNTER — Encounter (HOSPITAL_COMMUNITY): Payer: Self-pay | Admitting: Orthopedic Surgery

## 2023-06-23 DIAGNOSIS — N390 Urinary tract infection, site not specified: Secondary | ICD-10-CM | POA: Diagnosis not present

## 2023-06-23 DIAGNOSIS — A419 Sepsis, unspecified organism: Secondary | ICD-10-CM | POA: Diagnosis not present

## 2023-06-23 LAB — CBC
HCT: 30.3 % — ABNORMAL LOW (ref 39.0–52.0)
Hemoglobin: 9 g/dL — ABNORMAL LOW (ref 13.0–17.0)
MCH: 26.8 pg (ref 26.0–34.0)
MCHC: 29.7 g/dL — ABNORMAL LOW (ref 30.0–36.0)
MCV: 90.2 fL (ref 80.0–100.0)
Platelets: 197 10*3/uL (ref 150–400)
RBC: 3.36 MIL/uL — ABNORMAL LOW (ref 4.22–5.81)
RDW: 15 % (ref 11.5–15.5)
WBC: 6.1 10*3/uL (ref 4.0–10.5)
nRBC: 0 % (ref 0.0–0.2)

## 2023-06-23 LAB — BASIC METABOLIC PANEL WITH GFR
Anion gap: 8 (ref 5–15)
BUN: 24 mg/dL — ABNORMAL HIGH (ref 8–23)
CO2: 22 mmol/L (ref 22–32)
Calcium: 7.4 mg/dL — ABNORMAL LOW (ref 8.9–10.3)
Chloride: 106 mmol/L (ref 98–111)
Creatinine, Ser: 0.87 mg/dL (ref 0.61–1.24)
GFR, Estimated: >60 mL/min (ref >60)
Glucose, Bld: 260 mg/dL — ABNORMAL HIGH (ref 70–99)
Potassium: 5.8 mmol/L — ABNORMAL HIGH (ref 3.5–5.1)
Sodium: 136 mmol/L (ref 135–145)

## 2023-06-23 LAB — POTASSIUM: Potassium: 4.7 mmol/L (ref 3.5–5.1)

## 2023-06-23 LAB — GLUCOSE, CAPILLARY
Glucose-Capillary: 282 mg/dL — ABNORMAL HIGH (ref 70–99)
Glucose-Capillary: 314 mg/dL — ABNORMAL HIGH (ref 70–99)
Glucose-Capillary: 282 mg/dL — ABNORMAL HIGH (ref 70–99)
Glucose-Capillary: 360 mg/dL — ABNORMAL HIGH (ref 70–99)

## 2023-06-23 NOTE — NC FL2 (Signed)
 [place] MEDICAID FL2 LEVEL OF CARE FORM     IDENTIFICATION  Patient Name: Dustin Duffy Birthdate: [birth year] Sex: male Admission Date (Current Location): 06/20/2023  [facility] and [place] Number:  Producer, television/film/video and Address:  [facility],  [address], [place] [address]      Provider Number: [id number]  Attending Physician Name and Address:  Leatha Gilding, MD  Relative Name and Phone Number:  Genevie Cheshire) [phone]    Current Level of Care: Hospital Recommended Level of Care: Skilled Nursing Facility Prior Approval Number:    Date Approved/Denied:   PASRR Number: [id number]  Discharge Plan: SNF    Current Diagnoses: Patient Active Problem List   Diagnosis Date Noted   UTI (urinary tract infection) 06/21/2023   Acute UTI 06/20/2023   Sepsis secondary to UTI (HCC) 06/20/2023   Complicated UTI (urinary tract infection) 04/14/2023   Falls frequently 06/06/2022   Acute kidney injury (HCC) 04/29/2022   BPH with urinary obstruction 04/29/2022   Foley catheter in place prior to arrival 04/29/2022   Frequent falls 04/29/2022   Multiple fractures of ribs, left side, initial encounter for closed fracture 04/29/2022   Sacral fracture, closed (HCC) 04/29/2022   Closed wedge compression fracture of T12 vertebra (HCC) 04/29/2022   Diabetes mellitus type 2 in nonobese (HCC) 02/09/2021   Dyslipidemia 02/09/2021   Essential hypertension 02/09/2021   Renal impairment 02/09/2021   Non-traumatic rhabdomyolysis 02/09/2021   Visual impairment 02/09/2021    Orientation RESPIRATION BLADDER Height & Weight     Self, Time, Situation, Place  O2 Continent Weight: 53.7 kg Height:  5\' 8"  (172.7 cm)  BEHAVIORAL SYMPTOMS/MOOD NEUROLOGICAL BOWEL NUTRITION STATUS      Continent Diet (CHO MOD)  AMBULATORY STATUS COMMUNICATION OF NEEDS Skin   Limited Assist Verbally Normal                       Personal Care Assistance Level of Assistance   Bathing, Feeding, Dressing Bathing Assistance: Limited assistance Feeding assistance: Limited assistance Dressing Assistance: Limited assistance     Functional Limitations Info  Sight, Hearing, Speech Sight Info: Impaired (legally blind) Hearing Info: Adequate Speech Info: Adequate    SPECIAL CARE FACTORS FREQUENCY  PT (By licensed PT), OT (By licensed OT)     PT Frequency: 5x week OT Frequency: 5x week            Contractures Contractures Info: Not present    Additional Factors Info  Code Status, Allergies Code Status Info: Full Allergies Info: NKDA           Current Medications (06/23/2023):  This is the current hospital active medication list Current Facility-Administered Medications  Medication Dose Route Frequency Provider Last Rate Last Admin   0.9 % NaCl with KCl 20 mEq/ L  infusion   Intravenous Continuous Armida Sans, PA-C 75 mL/hr at 06/22/23 2108 New Bag at 06/22/23 2108   acetaminophen (TYLENOL) tablet 1,000 mg  1,000 mg Oral Q6H Janine Ores K, PA-C   1,000 mg at 06/23/23 1240   acetaminophen (TYLENOL) tablet 325-650 mg  325-650 mg Oral Q6H PRN Janine Ores K, PA-C       alum & mag hydroxide-simeth (MAALOX/MYLANTA) 200-200-20 MG/5ML suspension 30 mL  30 mL Oral Q4H PRN Janine Ores K, PA-C       amitriptyline (ELAVIL) tablet 50 mg  50 mg Oral QHS Janine Ores K, PA-C   50 mg at 06/22/23 2103  bisacodyl (DULCOLAX) suppository 10 mg  10 mg Rectal Daily PRN Janine Ores K, PA-C       cefTRIAXone (ROCEPHIN) 1 g in sodium chloride 0.9 % 100 mL IVPB  1 g Intravenous Q24H [name], Blaine K, PA-C 200 mL/hr at 06/23/23 1432 1 g at 06/23/23 1432   Chlorhexidine Gluconate Cloth 2 % PADS 6 each  6 each Topical Daily Armida Sans, PA-C   6 each at 06/22/23 0855   diphenhydrAMINE (BENADRYL) 12.5 MG/5ML elixir 12.5-25 mg  12.5-25 mg Oral Q4H PRN Janine Ores K, PA-C       docusate sodium (COLACE) capsule 100 mg  100 mg Oral BID [name], Blaine K, PA-C   100 mg at  06/23/23 1000   fluconazole (DIFLUCAN) tablet 200 mg  200 mg Oral QHS Janine Ores K, PA-C   200 mg at 06/22/23 2102   fludrocortisone (FLORINEF) tablet 0.1 mg  0.1 mg Oral Daily Armida Sans, PA-C   0.1 mg at 06/23/23 1000   gabapentin (NEURONTIN) capsule 900 mg  900 mg Oral QHS Armida Sans, PA-C   900 mg at 06/22/23 2103   HYDROmorphone (DILAUDID) injection 0.5-1 mg  0.5-1 mg Intravenous Q4H PRN Janine Ores K, PA-C       insulin aspart (novoLOG) injection 0-5 Units  0-5 Units Subcutaneous QHS Armida Sans, PA-C   4 Units at 06/22/23 2304   insulin aspart (novoLOG) injection 0-9 Units  0-9 Units Subcutaneous TID WC Janine Ores K, PA-C   7 Units at 06/23/23 1244   lip balm (CARMEX) ointment   Topical PRN Janine Ores K, PA-C       magnesium citrate solution 1 Bottle  1 Bottle Oral Once PRN Janine Ores K, PA-C       menthol-cetylpyridinium (CEPACOL) lozenge 3 mg  1 lozenge Oral PRN Janine Ores K, PA-C       Or   phenol (CHLORASEPTIC) mouth spray 1 spray  1 spray Mouth/Throat PRN Janine Ores K, PA-C       methocarbamol (ROBAXIN) tablet 500 mg  500 mg Oral Q6H PRN Janine Ores K, PA-C       Or   methocarbamol (ROBAXIN) injection 500 mg  500 mg Intravenous Q6H PRN Janine Ores K, PA-C   500 mg at 06/23/23 1426   metoCLOPramide (REGLAN) tablet 5-10 mg  5-10 mg Oral Q8H PRN Janine Ores K, PA-C       Or   metoCLOPramide (REGLAN) injection 5-10 mg  5-10 mg Intravenous Q8H PRN [name], Blaine K, PA-C       midodrine (PROAMATINE) tablet 5 mg  5 mg Oral TID WC Janine Ores K, PA-C   5 mg at 06/23/23 1243   mirabegron ER (MYRBETRIQ) tablet 50 mg  50 mg Oral Daily Janine Ores K, PA-C   50 mg at 06/22/23 7829   mupirocin ointment (BACTROBAN) 2 % 1 Application  1 Application Nasal BID Janine Ores K, PA-C   1 Application at 06/22/23 2114   ondansetron (ZOFRAN) tablet 4 mg  4 mg Oral Q6H PRN Janine Ores K, PA-C       Or   ondansetron [facility]) injection 4 mg  4 mg Intravenous Q6H  PRN Janine Ores K, PA-C       oxyCODONE (Oxy IR/ROXICODONE) immediate release tablet 10-15 mg  10-15 mg Oral Q4H PRN Janine Ores K, PA-C       oxyCODONE (Oxy IR/ROXICODONE) immediate release tablet 5-10 mg  5-10 mg Oral Q4H PRN  Janine Ores K, PA-C   5 mg at 06/23/23 1426   senna-docusate (Senokot-S) tablet 1 tablet  1 tablet Oral QHS PRN Janine Ores K, PA-C       simvastatin (ZOCOR) tablet 20 mg  20 mg Oral QHS Janine Ores K, PA-C   20 mg at 06/22/23 2103     Discharge Medications: Please see discharge summary for a list of discharge medications.  Relevant Imaging Results:  Relevant Lab Results:   Additional Information SS#[id number]  Domenica Weightman, Olegario Messier, RN

## 2023-06-23 NOTE — NC FL2 (Deleted)
 [place] MEDICAID FL2 LEVEL OF CARE FORM     IDENTIFICATION  Patient Name: Dustin Duffy Birthdate: 25-Aug-1955 Sex: male Admission Date (Current Location): 06/20/2023  [facility] and [place] Number:  Producer, television/film/video and Address:  [facility],  [address] [place] [address]      Provider Number: [id number]  Attending Physician Name and Address:  Leatha Gilding, MD  Relative Name and Phone Number:  Genevie Cheshire) [phone]    Current Level of Care: Hospital Recommended Level of Care: Skilled Nursing Facility Prior Approval Number:    Date Approved/Denied:   PASRR Number: [id number]  Discharge Plan: SNF    Current Diagnoses: Patient Active Problem List   Diagnosis Date Noted   UTI (urinary tract infection) 06/21/2023   Acute UTI 06/20/2023   Sepsis secondary to UTI (HCC) 06/20/2023   Complicated UTI (urinary tract infection) 04/14/2023   Falls frequently 06/06/2022   Acute kidney injury (HCC) 04/29/2022   BPH with urinary obstruction 04/29/2022   Foley catheter in place prior to arrival 04/29/2022   Frequent falls 04/29/2022   Multiple fractures of ribs, left side, initial encounter for closed fracture 04/29/2022   Sacral fracture, closed (HCC) 04/29/2022   Closed wedge compression fracture of T12 vertebra (HCC) 04/29/2022   Diabetes mellitus type 2 in nonobese (HCC) 02/09/2021   Dyslipidemia 02/09/2021   Essential hypertension 02/09/2021   Renal impairment 02/09/2021   Non-traumatic rhabdomyolysis 02/09/2021   Visual impairment 02/09/2021    Orientation RESPIRATION BLADDER Height & Weight     Self, Time, Situation, Place  Normal Continent Weight: 53.7 kg Height:  5\' 8"  (172.7 cm)  BEHAVIORAL SYMPTOMS/MOOD NEUROLOGICAL BOWEL NUTRITION STATUS      Continent Diet (CHO MOD)  AMBULATORY STATUS COMMUNICATION OF NEEDS Skin   Limited Assist Verbally Normal                       Personal Care Assistance Level of Assistance   Bathing, Feeding, Dressing Bathing Assistance: Limited assistance Feeding assistance: Limited assistance Dressing Assistance: Limited assistance     Functional Limitations Info  Sight, Hearing, Speech Sight Info: Impaired (legally blind) Hearing Info: Adequate Speech Info: Adequate    SPECIAL CARE FACTORS FREQUENCY  PT (By licensed PT), OT (By licensed OT)     PT Frequency: 5x week OT Frequency: 5x week            Contractures Contractures Info: Not present    Additional Factors Info  Code Status, Allergies Code Status Info: Full Allergies Info: NKDA           Current Medications (06/23/2023):  This is the current hospital active medication list Current Facility-Administered Medications  Medication Dose Route Frequency Provider Last Rate Last Admin   0.9 % NaCl with KCl 20 mEq/ L  infusion   Intravenous Continuous Armida Sans, PA-C 75 mL/hr at 06/22/23 2108 New Bag at 06/22/23 2108   acetaminophen (TYLENOL) tablet 1,000 mg  1,000 mg Oral Q6H Janine Ores K, PA-C   1,000 mg at 06/23/23 1240   acetaminophen (TYLENOL) tablet 325-650 mg  325-650 mg Oral Q6H PRN Janine Ores K, PA-C       alum & mag hydroxide-simeth (MAALOX/MYLANTA) 200-200-20 MG/5ML suspension 30 mL  30 mL Oral Q4H PRN Janine Ores K, PA-C       amitriptyline (ELAVIL) tablet 50 mg  50 mg Oral QHS Janine Ores K, PA-C   50 mg at 06/22/23 2103  bisacodyl (DULCOLAX) suppository 10 mg  10 mg Rectal Daily PRN Janine Ores K, PA-C       cefTRIAXone (ROCEPHIN) 1 g in sodium chloride 0.9 % 100 mL IVPB  1 g Intravenous Q24H [name], Blaine K, PA-C 200 mL/hr at 06/23/23 1432 1 g at 06/23/23 1432   Chlorhexidine Gluconate Cloth 2 % PADS 6 each  6 each Topical Daily Armida Sans, PA-C   6 each at 06/22/23 0855   diphenhydrAMINE (BENADRYL) 12.5 MG/5ML elixir 12.5-25 mg  12.5-25 mg Oral Q4H PRN Janine Ores K, PA-C       docusate sodium (COLACE) capsule 100 mg  100 mg Oral BID [name], Blaine K, PA-C   100 mg at  06/23/23 1000   fluconazole (DIFLUCAN) tablet 200 mg  200 mg Oral QHS Janine Ores K, PA-C   200 mg at 06/22/23 2102   fludrocortisone (FLORINEF) tablet 0.1 mg  0.1 mg Oral Daily Armida Sans, PA-C   0.1 mg at 06/23/23 1000   gabapentin (NEURONTIN) capsule 900 mg  900 mg Oral QHS Armida Sans, PA-C   900 mg at 06/22/23 2103   HYDROmorphone (DILAUDID) injection 0.5-1 mg  0.5-1 mg Intravenous Q4H PRN Janine Ores K, PA-C       insulin aspart (novoLOG) injection 0-5 Units  0-5 Units Subcutaneous QHS Armida Sans, PA-C   4 Units at 06/22/23 2304   insulin aspart (novoLOG) injection 0-9 Units  0-9 Units Subcutaneous TID WC Janine Ores K, PA-C   7 Units at 06/23/23 1244   lip balm (CARMEX) ointment   Topical PRN Janine Ores K, PA-C       magnesium citrate solution 1 Bottle  1 Bottle Oral Once PRN Janine Ores K, PA-C       menthol-cetylpyridinium (CEPACOL) lozenge 3 mg  1 lozenge Oral PRN Janine Ores K, PA-C       Or   phenol (CHLORASEPTIC) mouth spray 1 spray  1 spray Mouth/Throat PRN Janine Ores K, PA-C       methocarbamol (ROBAXIN) tablet 500 mg  500 mg Oral Q6H PRN Janine Ores K, PA-C       Or   methocarbamol (ROBAXIN) injection 500 mg  500 mg Intravenous Q6H PRN Janine Ores K, PA-C   500 mg at 06/23/23 1426   metoCLOPramide (REGLAN) tablet 5-10 mg  5-10 mg Oral Q8H PRN Janine Ores K, PA-C       Or   metoCLOPramide (REGLAN) injection 5-10 mg  5-10 mg Intravenous Q8H PRN [name], Blaine K, PA-C       midodrine (PROAMATINE) tablet 5 mg  5 mg Oral TID WC Janine Ores K, PA-C   5 mg at 06/23/23 1243   mirabegron ER (MYRBETRIQ) tablet 50 mg  50 mg Oral Daily Janine Ores K, PA-C   50 mg at 06/22/23 1610   mupirocin ointment (BACTROBAN) 2 % 1 Application  1 Application Nasal BID Janine Ores K, PA-C   1 Application at 06/22/23 2114   ondansetron (ZOFRAN) tablet 4 mg  4 mg Oral Q6H PRN Janine Ores K, PA-C       Or   ondansetron [facility]) injection 4 mg  4 mg Intravenous Q6H  PRN Janine Ores K, PA-C       oxyCODONE (Oxy IR/ROXICODONE) immediate release tablet 10-15 mg  10-15 mg Oral Q4H PRN Janine Ores K, PA-C       oxyCODONE (Oxy IR/ROXICODONE) immediate release tablet 5-10 mg  5-10 mg Oral Q4H PRN  Janine Ores K, PA-C   5 mg at 06/23/23 1426   senna-docusate (Senokot-S) tablet 1 tablet  1 tablet Oral QHS PRN Janine Ores K, PA-C       simvastatin (ZOCOR) tablet 20 mg  20 mg Oral QHS Janine Ores K, PA-C   20 mg at 06/22/23 2103     Discharge Medications: Please see discharge summary for a list of discharge medications.  Relevant Imaging Results:  Relevant Lab Results:   Additional Information [id number]  Oluwatobi Ruppe, Olegario Messier, RN

## 2023-06-23 NOTE — Progress Notes (Signed)
 Physical Therapy Treatment Patient Details Name: Dustin Duffy MRN: [id number] DOB: 1955/10/08 Today's Date: 06/23/2023   History of Present Illness Patient is a 68 year old male who presented after a fall at home resulting in L shoulder pain. Patient was admitted with UTI, proximal L humerus fracture, sinus tachycardia. Patient is s/p L UE ORIF on 06/22/23.   PMH: urology 04/28/22 with UTI, DM II, HLD, BPH,T12 wedge compression fracture, legally blind,    PT Comments  Pt with gradual progress but still poor insight to deficits and poor balance.  He required mod A to prevent falls for multiple loss of balance during PT session.  He ambulated 66' with quad cane and mod A.  Pt convinced/adamant on return home but does not have support  for mobility and ADLs.  Encouraged him to think about how he would perform ADLs (toileting, etc) without using dominant UE or how he would manage balance when he required assist to prevent at least 5 falls during  PT session.  Pt reports has a friend who can assist and that he "knows when to sit still."  Still strongly recommend Patient will benefit from continued inpatient follow up therapy, <3 hours/day  - notified TOC.  Will need to confirm level of support available, or potentially family/friends could convince pt of inabiltiy to return home safely if support is not available.     If plan is discharge home, recommend the following: A lot of help with walking and/or transfers;A lot of help with bathing/dressing/bathroom;Assistance with cooking/housework;Help with stairs or ramp for entrance   Can travel by private vehicle     Yes  Equipment Recommendations  Other (comment) (Hemiwalker vs quad cane - needs further practice)    Recommendations for Other Services       Precautions / Restrictions Precautions Precautions: Fall Required Braces or Orthoses: Sling Restrictions LUE Weight Bearing Per Provider Order: Non weight bearing     Mobility  Bed  Mobility Overal bed mobility: Needs Assistance Bed Mobility: Supine to Sit     Supine to sit: Mod assist     General bed mobility comments: Assist to lift trunk and scoot forward.  Pt asking for assist when encouraged to try on his own.  Asked pt how he would do at home and said "i'll manage"    Transfers Overall transfer level: Needs assistance Equipment used: 1 person hand held assist, Quad cane Transfers: Sit to/from Stand Sit to Stand: Mod assist           General transfer comment: Only min A to rise but then with posterior LOB requiring mod A to recover.  Performed x 3 (HHA then quad cane) each time with posterior LOB    Ambulation/Gait Ambulation/Gait assistance: Mod assist Gait Distance (Feet): 40 Feet Assistive device: Quad cane Gait Pattern/deviations: Step-to pattern, Decreased stride length, Shuffle, Narrow base of support Gait velocity: decreased Gait velocity interpretation: <1.8 ft/sec, indicate of risk for recurrent falls   General Gait Details: Pt with very short steps and decreased speed. He needed cues and assist to keep quad cane level.  Pt had 5 LOB posterior requrining mod A to recover   Stairs             Wheelchair Mobility     Tilt Bed    Modified Rankin (Stroke Patients Only)       Balance Overall balance assessment: Needs assistance Sitting-balance support: Single extremity supported Sitting balance-Leahy Scale: Poor Sitting balance - Comments: Needs R  UE support and leaning to R   Standing balance support: Single extremity supported Standing balance-Leahy Scale: Poor Standing balance comment: R UE on quad cane; multiple LOB static and walking                            Communication    Cognition Arousal: Alert Behavior During Therapy: WFL for tasks assessed/performed   PT - Cognitive impairments: Awareness, Safety/Judgement                       PT - Cognition Comments: Decreased insight to  deficits/safety in regards to ability to return home        Cueing    Exercises      General Comments General comments (skin integrity, edema, etc.): Pt on 3 L O2 with sats 95% rest and 89% walking but quickly recovered.      Pertinent Vitals/Pain Pain Assessment Pain Assessment: No/denies pain (L shoulder still numb, had block)    Home Living                          Prior Function            PT Goals (current goals can now be found in the care plan section) Progress towards PT goals: Progressing toward goals    Frequency    Min 3X/week      PT Plan      Co-evaluation              AM-PAC PT "6 Clicks" Mobility   Outcome Measure  Help needed turning from your back to your side while in a flat bed without using bedrails?: A Lot Help needed moving from lying on your back to sitting on the side of a flat bed without using bedrails?: A Lot Help needed moving to and from a bed to a chair (including a wheelchair)?: A Lot Help needed standing up from a chair using your arms (e.g., wheelchair or bedside chair)?: A Lot Help needed to walk in hospital room?: A Lot Help needed climbing 3-5 steps with a railing? : Total 6 Click Score: 11    End of Session Equipment Utilized During Treatment: Gait belt;Other (comment) (sling) Activity Tolerance: Patient tolerated treatment well Patient left: with chair alarm set;in chair;with call bell/phone within reach;with SCD's reapplied Nurse Communication: Mobility status PT Visit Diagnosis: Unsteadiness on feet (R26.81);History of falling (Z91.81);Muscle weakness (generalized) (M62.81)     Time: 7253-6644 PT Time Calculation (min) (ACUTE ONLY): 34 min  Charges:    $Gait Training: 8-22 mins $Therapeutic Activity: 8-22 mins PT General Charges $$ ACUTE PT VISIT: 1 Visit                     Anise Salvo, PT Acute Rehab Services [facility] [phone]    Rayetta Humphrey 06/23/2023, 11:59 AM

## 2023-06-23 NOTE — Progress Notes (Signed)
 PROGRESS NOTE  SHON INDELICATO UUV:[id number] DOB: Sep 16, 1955 DOA: 06/20/2023 PCP: Irena Reichmann, DO   LOS: 3 days   Brief Narrative / Interim history: 68 year old male with DM2, BPH with urinary obstruction with chronic Foley catheter, CKD 2, HTN, HLD comes into the hospital after a fall.  He was walking from his kitchen to the living room when all of a sudden his legs gave out and fell on the left side.  He had left shoulder pain afterwards.  He presented to the ER, was found to have a UTI and x-ray of the shoulder showed acute displaced fracture of the proximal left humerus.  Orthopedic surgery was consulted and he was admitted to the hospital  Subjective / 24h Interval events: Doing well this morning, reports that he was a bit confused this morning but clearing up.  Thought he was someplace else  Assesement and Plan: Principal problem Sepsis due to UTI -met sepsis criteria with tachycardia, tachypnea, elevated lactic acid, although latter could have been in the setting of recent fall.  Urine culture sent and growing 70^5 yeast, patient was started on ceftriaxone as well as fluconazole, continue for now perioperatively.  Will plan for a total of 5 days, continue antibiotics -He is afebrile, white count is normal  Active problems Proximal L humerus fracture -orthopedic surgery consulted, status post-ORIF 3/27.  PT/OT consulted today   Frequent falls -admitted in January for fall suspected to be 2/2 UTI.  PT/OT consulted, recommendations were for SNF however patient is choosing to go home postoperatively.  Will maximize home health  Sinus tachycardia - Heart rates ranging in the 100s to 130s since arrival to the ED.  EKG showing sinus tachycardia.  Suspect secondary to underlying infection and pain from humerus fracture.  Improved now   Hypertension, history of hypotension - Patient with history of hypotension thought to be 2/2 autonomic dysfunction stemming from T2DM. He is on midodrine 5 mg  3 times daily at home along with fludrocortisone 0.1mg  daily.  Resume here.  Blood pressure better and now overall stable and acceptable   T2DM - On xigduo 10-500mg  daily at home. Question whether SGLT-2i component is leading to recurrent UTIs. Will hold home xigduo. A1c 8.0% in 04/12/2023.  Continue sliding scale   HLD -resume home zocor 20mg  qhs   BPH with urinary obstruction s/p chronic indwelling Foley catheter -follows Dr. Alvester Morin, [facility]. Foley catheter switched on arrival to ED. Continue home mirabegron 50mg  daily  Hypomagnesemia-replace magnesium and continue to monitor  CKD Stage 2 -kidney function stable and at baseline  Anemia of chronic disease-hemoglobin stable  Scheduled Meds:  acetaminophen  1,000 mg Oral Q6H   amitriptyline  50 mg Oral QHS   Chlorhexidine Gluconate Cloth  6 each Topical Daily   docusate sodium  100 mg Oral BID   fluconazole  200 mg Oral QHS   fludrocortisone  0.1 mg Oral Daily   gabapentin  900 mg Oral QHS   insulin aspart  0-5 Units Subcutaneous QHS   insulin aspart  0-9 Units Subcutaneous TID WC   midodrine  5 mg Oral TID WC   mirabegron ER  50 mg Oral Daily   mupirocin ointment  1 Application Nasal BID   simvastatin  20 mg Oral QHS   Continuous Infusions:  0.9 % NaCl with KCl 20 mEq / L 75 mL/hr at 06/22/23 2108   cefTRIAXone (ROCEPHIN)  IV 1 g (06/21/23 1431)   PRN Meds:.acetaminophen, alum & mag hydroxide-simeth, bisacodyl, diphenhydrAMINE,  HYDROmorphone (DILAUDID) injection, lip balm, magnesium citrate, menthol-cetylpyridinium **OR** phenol, methocarbamol **OR** methocarbamol (ROBAXIN) injection, metoCLOPramide **OR** metoCLOPramide (REGLAN) injection, ondansetron **OR** ondansetron (ZOFRAN) IV, oxyCODONE, oxyCODONE, senna-docusate  Current Outpatient Medications  Medication Instructions   acetaminophen (TYLENOL) 650 mg, Oral, Every 6 hours PRN   amitriptyline (ELAVIL) 50 mg, Oral, Daily at bedtime   aspirin EC 81 mg, Oral, Every  morning, Swallow whole.   cholecalciferol (VITAMIN D3) 1,000 Units, Oral, Every M-W-F   fludrocortisone (FLORINEF) 0.1 mg, Oral, Daily   gabapentin (NEURONTIN) 900 mg, Oral, Daily at bedtime   ipratropium (ATROVENT) 0.03 % nasal spray 2 sprays, Each Nare, 2 times daily PRN   Januvia 50 mg, Every morning   midodrine (PROAMATINE) 5 mg, Oral, 3 times daily with meals   Myrbetriq 50 mg, Oral, Every morning   oxyCODONE (ROXICODONE) 5 mg, Oral, Every 4 hours PRN   simvastatin (ZOCOR) 20 mg, Oral, Every evening, Do not take this medication until you finish Diflucan.   Water For Irrigation, Sterile (STERILE WATER) Irrigation, See admin instructions, Mix sterile water with white vinegar 4:1 and flush indwelling catheter nightly before bedtime    XIGDUO XR 10-500 MG TB24 1 tablet, Daily before breakfast    Diet Orders (From admission, onward)     Start     Ordered   06/23/23 0621  Diet Carb Modified Fluid consistency: Thin; Room service appropriate? Yes  Diet effective now       Question Answer Comment  Diet-HS Snack? Nothing   Calorie Level Medium 1600-2000   Fluid consistency: Thin   Room service appropriate? Yes      06/23/23 0620            DVT prophylaxis: SCD's Start: 06/22/23 1829 Place and maintain sequential compression device Start: 06/20/23 2044   Lab Results  Component Value Date   PLT 197 06/23/2023      Code Status: Full Code  Family Communication: No family at bedside  Status is: Inpatient Remains inpatient appropriate because: severity of illness  Level of care: Telemetry  Consultants:  Orthopedic surgery   Objective: Vitals:   06/22/23 1939 06/23/23 0013 06/23/23 0438 06/23/23 0913  BP:  117/77 (!) 144/81 128/81  Pulse:  97 (!) 103 (!) 110  Resp:  16 17 (!) 22  Temp:  98.7 F (37.1 C) 98.3 F (36.8 C) 98.3 F (36.8 C)  TempSrc:  Oral  Oral  SpO2: 92% 94% 94% 91%  Weight:      Height:        Intake/Output Summary (Last 24 hours) at 06/23/2023  1044 Last data filed at 06/23/2023 0930 Gross per 24 hour  Intake 3653.68 ml  Output 1850 ml  Net 1803.68 ml   Wt Readings from Last 3 Encounters:  06/22/23 53.7 kg  04/12/23 63 kg  06/06/22 63.5 kg    Examination:  Constitutional: NAD Eyes: lids and conjunctivae normal, no scleral icterus ENMT: mmm Neck: normal, supple Respiratory: clear to auscultation bilaterally, no wheezing, no crackles.  Cardiovascular: Regular rate and rhythm, no murmurs / rubs / gallops. No LE edema. Abdomen: soft, no distention, no tenderness. Bowel sounds positive.  Skin: no rashes  Data Reviewed: I have independently reviewed following labs and imaging studies   CBC Recent Labs  Lab 06/20/23 1316 06/21/23 0504 06/22/23 0521 06/23/23 0432  WBC 8.9 6.6 7.7 6.1  HGB 12.9* 10.0* 9.9* 9.0*  HCT 42.9 32.4* 32.8* 30.3*  PLT 234 202 207 197  MCV 86.8 87.1 87.7 90.2  MCH 26.1 26.9 26.5 26.8  MCHC 30.1 30.9 30.2 29.7*  RDW 15.3 15.4 15.0 15.0  LYMPHSABS 0.7  --   --   --   MONOABS 0.8  --   --   --   EOSABS 0.0  --   --   --   BASOSABS 0.1  --   --   --     Recent Labs  Lab 06/20/23 1316 06/20/23 1835 06/20/23 2138 06/21/23 0114 06/21/23 0504 06/22/23 0521 06/23/23 0432 06/23/23 0641  NA 141  --   --   --  137 136 136  --   K 4.0  --   --   --  4.0 3.7 5.8* 4.7  CL 101  --   --   --  101 100 106  --   CO2 32  --   --   --  27 28 22   --   GLUCOSE 181*  --   --   --  112* 167* 260*  --   BUN 20  --   --   --  22 25* 24*  --   CREATININE 1.18  --   --   --  1.08 0.97 0.87  --   CALCIUM 9.0  --   --   --  8.4* 8.3* 7.4*  --   AST  --   --   --   --   --  13*  --   --   ALT  --   --   --   --   --  13  --   --   ALKPHOS  --   --   --   --   --  52  --   --   BILITOT  --   --   --   --   --  0.6  --   --   ALBUMIN  --   --   --   --   --  2.1*  --   --   MG  --   --   --   --   --  1.6*  --   --   LATICACIDVEN  --  >9.0* 1.1 0.9  --   --   --   --      ------------------------------------------------------------------------------------------------------------------ No results for input(s): "CHOL", "HDL", "LDLCALC", "TRIG", "CHOLHDL", "LDLDIRECT" in the last 72 hours.  Lab Results  Component Value Date   HGBA1C 8.0 (H) 04/12/2023   ------------------------------------------------------------------------------------------------------------------ No results for input(s): "TSH", "T4TOTAL", "T3FREE", "THYROIDAB" in the last 72 hours.  Invalid input(s): "FREET3"  Cardiac Enzymes No results for input(s): "CKMB", "TROPONINI", "MYOGLOBIN" in the last 168 hours.  Invalid input(s): "CK" ------------------------------------------------------------------------------------------------------------------ No results found for: "BNP"  CBG: Recent Labs  Lab 06/22/23 1326 06/22/23 1751 06/22/23 2007 06/22/23 2301 06/23/23 0721  GLUCAP 124* 115* 356* 348* 282*    Recent Results (from the past 240 hours)  Urine Culture     Status: Abnormal   Collection Time: 06/20/23 12:14 PM   Specimen: Urine, Random  Result Value Ref Range Status   Specimen Description   Final    URINE, RANDOM Performed at [facility], [address], [place], [place] [address]    Special Requests   Final    NONE Reflexed from [id number] Performed at [facility], [address] [place], [place] [address]    Culture (A)  Final    70,000 COLONIES/mL YEAST Performed at  [facility], [address], [place], [place] [address]    Report Status 06/21/2023 FINAL  Final  Culture, blood (Routine X 2) w Reflex to ID Panel     Status: None (Preliminary result)   Collection Time: 06/20/23  8:05 PM   Specimen: BLOOD  Result Value Ref Range Status   Specimen Description   Final    BLOOD BLOOD LEFT HAND Performed at [facility], [address] [place], [place] [address]    Special Requests   Final     BOTTLES DRAWN AEROBIC AND ANAEROBIC Blood Culture results may not be optimal due to an inadequate volume of blood received in culture bottles Performed at [facility], [address] [place], [place] [address]    Culture   Final    NO GROWTH 3 DAYS Performed at [facility], [address] [place], [place] [address]    Report Status PENDING  Incomplete  Culture, blood (Routine X 2) w Reflex to ID Panel     Status: None (Preliminary result)   Collection Time: 06/20/23  8:05 PM   Specimen: BLOOD  Result Value Ref Range Status   Specimen Description   Final    BLOOD RIGHT ANTECUBITAL Performed at [facility], [address] [place], [place] [address]    Special Requests   Final    BOTTLES DRAWN AEROBIC AND ANAEROBIC Blood Culture results may not be optimal due to an inadequate volume of blood received in culture bottles Performed at [facility], [address], [place], [place] [address]    Culture   Final    NO GROWTH 3 DAYS Performed at [facility], [address] [place], [place] [address]    Report Status PENDING  Incomplete  Surgical PCR screen     Status: None   Collection Time: 06/21/23  3:24 PM   Specimen: Nasal Mucosa; Nasal Swab  Result Value Ref Range Status   MRSA, PCR NEGATIVE NEGATIVE Final   Staphylococcus aureus NEGATIVE NEGATIVE Final    Comment: (NOTE) The Xpert SA Assay (FDA approved for NASAL specimens in patients 63 years of age and older), is one component of a comprehensive surveillance program. It is not intended to diagnose infection nor to guide or monitor treatment. Performed at [facility], [address], [place], [place] [address]      Radiology Studies: DG Shoulder Left Port Result Date: 06/22/2023 CLINICAL DATA:  Closed fracture of left proximal humerus, postop. EXAM: LEFT SHOULDER COMPARISON:  Preoperative imaging. FINDINGS: Lateral plate and screw  fixation of proximal humeral fracture. Improved fracture alignment from preoperative imaging. Recent postsurgical change includes air and edema in the soft tissues. IMPRESSION: ORIF of proximal humeral fracture. Electronically Signed   By: Narda Rutherford M.D.   On: 06/22/2023 19:07   DG Shoulder Left Result Date: 06/22/2023 CLINICAL DATA:  Elective surgery. EXAM: LEFT SHOULDER - 2+ VIEW COMPARISON:  Radiograph 06/20/2023 FINDINGS: Ten fluoroscopic spot views of the left shoulder submitted from the operating room. Lateral plate and multi screw fixation of proximal humeral fracture. Fluoroscopy time 54 seconds. Dose 5.3 mGy. IMPRESSION: Intraoperative fluoroscopy during proximal humeral fracture fixation. Electronically Signed   By: Narda Rutherford M.D.   On: 06/22/2023 17:13   DG C-Arm 1-60 Min-No Report Result Date: 06/22/2023 Fluoroscopy was utilized by the requesting physician.  No radiographic interpretation.   DG C-Arm 1-60 Min-No Report Result Date: 06/22/2023 Fluoroscopy  was utilized by the requesting physician.  No radiographic interpretation.      Pamella Pert, MD, PhD [facility]  Between 7 am - 7 pm I am available, please contact me via Amion (for emergencies) or Securechat (non urgent messages)  Between 7 pm - 7 am I am not available, please contact night coverage MD/APP via Amion

## 2023-06-23 NOTE — TOC Progression Note (Signed)
 Transition of Care [facility]) - Progression Note    Patient Details  Name: Dustin Duffy MRN: [id number] Date of Birth: [birth year]  Transition of Care [facility]) CM/SW Contact  Clemie General, Olegario Messier, RN Phone Number: 06/23/2023, 3:50 PM  Clinical Narrative:  PT recc ST SNF;patient now in agreement to ST SNF-faxed out await bed offers, then choice,     Expected Discharge Plan: Skilled Nursing Facility Barriers to Discharge: Continued Medical Work up  Expected Discharge Plan and Services   Discharge Planning Services: CM Consult Post Acute Care Choice: Home Health Living arrangements for the past 2 months: Single Family Home                           HH Arranged: PT, OT [facility] Agency: [facility] Date [facility] Agency Contacted: 06/21/23 Time HH Agency Contacted: 1431 Representative spoke with at [facility] Agency: Haywood Lasso   Social Determinants of Health (SDOH) Interventions SDOH Screenings   Food Insecurity: No Food Insecurity (06/21/2023)  Housing: Low Risk  (06/21/2023)  Transportation Needs: Unmet Transportation Needs (06/21/2023)  Utilities: Not At Risk (06/21/2023)  Depression (PHQ2-9): Low Risk  (04/02/2021)  Social Connections: Socially Isolated (06/21/2023)  Tobacco Use: Medium Risk (06/22/2023)    Readmission Risk Interventions    05/02/2022   10:09 AM  Readmission Risk Prevention Plan  Transportation Screening Complete  PCP or Specialist Appt within 5-7 Days Complete  Home Care Screening Complete  Medication Review (RN CM) Complete

## 2023-06-23 NOTE — Evaluation (Signed)
 Occupational Therapy Evaluation Patient Details Name: Dustin Duffy MRN: [id number] DOB: 1955-03-30 Today's Date: 06/23/2023   History of Present Illness   Patient is a 68 year old male who presented after a fall at home resulting in L shoulder pain. Patient was admitted with UTI, proximal L humerus fracture, sinus tachycardia. Patient is s/p L UE ORIF on 06/22/23.   PMH: urology 04/28/22 with UTI, DM II, HLD, BPH,T12 wedge compression fracture, legally blind,     Clinical Impressions Patient is currently requiring as high as Total assistance with basic ADLs, as well as  minimal assist with bed mobility and moderate assist with functional transfers from recliner to EOB with use of hemiwalker.   Current level of function is below patient's typical baseline.    During this evaluation, patient was limited by cognitive impairment with poor awareness of deficits and safety, h/o "At least 50 falls" in past 6 months, frailty and generalized weakness, impaired activity tolerance, and blindness, all of which has the potential to impact patient's and/or caregivers' safety and independence during functional mobility, as well as performance for ADLs.    Patient lives alone with a friend who would be unable to safely provide the necessary supervision and assistance that pt currently requires.  Patient demonstrates fair rehab potential, and should benefit from continued skilled occupational therapy services while in acute care to maximize safety, independence and quality of life at home.  Continued occupational therapy services in acute care are recommended. Patient will benefit from continued inpatient follow up therapy, <3 hours/day.    ?      If plan is discharge home, recommend the following:   Two people to help with walking and/or transfers;A lot of help with bathing/dressing/bathroom;Assistance with cooking/housework;Direct supervision/assist for medications management;Assist for transportation;Help  with stairs or ramp for entrance;Assistance with feeding;Direct supervision/assist for financial management     Functional Status Assessment   Patient has had a recent decline in their functional status and demonstrates the ability to make significant improvements in function in a reasonable and predictable amount of time.     Equipment Recommendations   None recommended by OT     Recommendations for Other Services         Precautions/Restrictions   Precautions Precautions: Fall;Shoulder Type of Shoulder Precautions: see below Precaution Booklet Issued:  (ORIF, not TSA) Precaution/Restrictions Comments: Sling at all times except ADL/exercise Yes  Non weight bearing Yes  AROM elbow, wrist and hand to tolerance Yes  PROM of shoulder No  AROM of shoulder No Required Braces or Orthoses: Sling Restrictions Weight Bearing Restrictions Per Provider Order: No LUE Weight Bearing Per Provider Order: Non weight bearing     Mobility Bed Mobility   Bed Mobility: Sit to Supine       Sit to supine: Min assist, HOB elevated   General bed mobility comments: Pt recevied in recliner.    Transfers                          Balance Overall balance assessment: Needs assistance Sitting-balance support: Single extremity supported Sitting balance-Leahy Scale: Poor Sitting balance - Comments: Pt received in recliner.     Standing balance-Leahy Scale: Poor Standing balance comment: R UE on hemiwalker.                           ADL either performed or assessed with clinical judgement   ADL Overall ADL's :  Needs assistance/impaired Eating/Feeding: Moderate assistance Eating/Feeding Details (indicate cue type and reason): patient reported that he is unable to feed himself with R hand. Pt is doing this but very messy and struggling. Would benefit from plate guard. Grooming: Sitting;Moderate assistance   Upper Body Bathing: Maximal assistance;Moderate  assistance Upper Body Bathing Details (indicate cue type and reason): Supported in Medical illustrator with Mod As. Lower Body Bathing: Total assistance;Sit to/from stand;Sitting/lateral leans   Upper Body Dressing : Total assistance;Sitting (supported sitting) Upper Body Dressing Details (indicate cue type and reason): sitting EOB with increased time and education on proper positioning. Lower Body Dressing: Bed level;Total assistance   Toilet Transfer: +2 for physical assistance;+2 for safety/equipment;Moderate assistance;Cueing for sequencing;Cueing for safety;Stand-pivot Toilet Transfer Details (indicate cue type and reason): use of hemi walker. Pt reported "My knee is going" but then asked to ambulate. Pt was directed to EOB for safety with Mod As and hemiwalker. Toileting- Clothing Manipulation and Hygiene: Total assistance;Maximal assistance       Functional mobility during ADLs: Cueing for safety;Cueing for sequencing (hemi-walker)       Vision Baseline Vision/History: 2 Legally blind       Perception         Praxis         Pertinent Vitals/Pain Pain Assessment Pain Assessment: 0-10 Pain Score: 5  Pain Location: L shoulder Pain Descriptors / Indicators: Grimacing, Guarding Pain Intervention(s): Limited activity within patient's tolerance, Monitored during session, Premedicated before session, Repositioned     Extremity/Trunk Assessment Upper Extremity Assessment Upper Extremity Assessment: Left hand dominant LUE Deficits / Details: humeral fracture. patient TD to adjust sling. LUE Coordination: decreased fine motor;decreased gross motor (Pt reports till somewhat numb from surgery)   Lower Extremity Assessment Lower Extremity Assessment: Generalized weakness   Cervical / Trunk Assessment Cervical / Trunk Assessment: Normal   Communication Communication Communication: No apparent difficulties Factors Affecting Communication: Hearing impaired   Cognition Arousal:  Alert Behavior During Therapy: WFL for tasks assessed/performed Cognition: Cognition impaired     Awareness: Intellectual awareness impaired, Online awareness impaired   Attention impairment (select first level of impairment): Selective attention Executive functioning impairment (select all impairments): Problem solving OT - Cognition Comments: patient has decreased insight to deficits at baseline.                 Following commands: Intact       Cueing  General Comments      Pt on 3 L O2 with sats 95% rest and 89% walking but quickly recovered.   Exercises Other Exercises Other Exercises: Pt's orders per Dr. Osie Bond re: okay for elbow->hand ROM but noshoulder movement. NWB explained. Positioning of sling explained. Pt yelling it is too much information. Pt reassured that he would receive repition here and in SNF.   Shoulder Instructions      Home Living Family/patient expects to be discharged to:: Skilled nursing facility Living Arrangements: Alone Available Help at Discharge: Family;Friend(s);Available PRN/intermittently Type of Home: House Home Access: Stairs to enter [employer] of Steps: 3+1 Entrance Stairs-Rails: Right;Left;Can reach both Home Layout: One level     Bathroom Shower/Tub: Producer, television/film/video: Handicapped height Bathroom Accessibility: Yes   Home Equipment: Agricultural consultant (2 wheels);Rollator (4 wheels);Cane - quad;Hand held shower head   Additional Comments: reported having a Quad cane at home      Prior Functioning/Environment Prior Level of Function : Independent/Modified Independent;History of Falls (last six months) (Pt endorses "50" fall in 6 months. Pt  stated he is not being sarcastic. Has had minor injuries but no other previous fractures.)             Mobility Comments: pt reports use of rollator or SPC. 50+ falls in 6 month. Pt reports this is from his sedentary lifestyle during the pandemic. Pt reports  he did not take care of himself and did not eat well and his friend who is a Engineer, civil (consulting), kept him alive, "without her, I wouldn't be here today". Pt reports since then his legs go out. ADLs Comments: takes care of small dog at home, friend, who is a Engineer, civil (consulting), assists with IADLs, transportation, and med management.    OT Problem List: Impaired balance (sitting and/or standing);Decreased cognition;Decreased knowledge of precautions;Pain;Increased edema;Impaired UE functional use;Decreased knowledge of use of DME or AE;Decreased activity tolerance;Decreased coordination;Decreased range of motion;Cardiopulmonary status limiting activity;Decreased safety awareness   OT Treatment/Interventions: Self-care/ADL training;DME and/or AE instruction;Therapeutic activities;Balance training;Therapeutic exercise;Energy conservation;Patient/family education      OT Goals(Current goals can be found in the care plan section)   Acute Rehab OT Goals Patient Stated Goal: Pt now agreeable to SNF with [facility] as 1st choice OT Goal Formulation: With patient Time For Goal Achievement: 07/05/23 Potential to Achieve Goals: Fair ADL Goals Pt Will Perform Eating: with set-up;sitting;with adaptive utensils (Plate guard now in room) Pt Will Perform Upper Body Dressing: with supervision;with set-up;sitting;with adaptive equipment (following shoulder prec) Pt Will Transfer to Toilet: with supervision;bedside commode;ambulating Pt Will Perform Toileting - Clothing Manipulation and hygiene: with supervision;with adaptive equipment;sitting/lateral leans Pt/caregiver will Perform Home Exercise Program: Left upper extremity (Per Dr. Farrell Ours orders) Additional ADL Goal #1: Pt will demonstrate UE/LE dressing, donning/doffing of sling, correct positioning of LT UE, and compensatory strategies for LT axilla hygiene, as well as use of Ice, while correctly following all shoulder post-op precautions/restrictions with verbal cues as needed.    OT Frequency:  Min 1X/week    Co-evaluation   Reason for Co-Treatment: To address functional/ADL transfers PT goals addressed during session: Mobility/safety with mobility OT goals addressed during session: ADL's and self-care      AM-PAC OT "6 Clicks" Daily Activity     Outcome Measure Help from another person eating meals?: A Lot Help from another person taking care of personal grooming?: A Lot Help from another person toileting, which includes using toliet, bedpan, or urinal?: A Lot Help from another person bathing (including washing, rinsing, drying)?: A Lot Help from another person to put on and taking off regular upper body clothing?: A Lot Help from another person to put on and taking off regular lower body clothing?: Total 6 Click Score: 11   End of Session Equipment Utilized During Treatment: Gait belt;Other (comment) (sling, hemiwalker) Nurse Communication: Mobility status  Activity Tolerance: Patient tolerated treatment well Patient left: in bed;with call bell/phone within reach;with bed alarm set;with SCD's reapplied  OT Visit Diagnosis: Unsteadiness on feet (R26.81);Other abnormalities of gait and mobility (R26.89);Pain;Muscle weakness (generalized) (M62.81);History of falling (Z91.81);Repeated falls (R29.6);Low vision, both eyes (H54.2) Pain - Right/Left: Left Pain - part of body: Shoulder                Time: 1326-1406 OT Time Calculation (min): 40 min Charges:  OT General Charges $OT Visit: 1 Visit OT Evaluation $OT Eval Low Complexity: 1 Low OT Treatments $Self Care/Home Management : 8-22 mins $Therapeutic Activity: 8-22 mins  Victorino Dike, OT Acute Rehab Services Office: [phone] 06/23/2023   Theodoro Clock 06/23/2023, 2:26 PM

## 2023-06-23 NOTE — Progress Notes (Addendum)
 Subjective: 1 Day Post-Op s/p Procedure(s): OPEN REDUCTION INTERNAL FIXATION (ORIF) PROXIMAL HUMERUS FRACTURE   Patient is alert, states pain at left shoulder is well controlled. He unsure why he thought he was having hip surgery yesterday, but denies any hip pain.  No nausea/vomiting. No other complaints.    Objective:  PE: VITALS:   Vitals:   06/22/23 1939 06/23/23 0013 06/23/23 0438 06/23/23 0913  BP:  117/77 (!) 144/81 128/81  Pulse:  97 (!) 103 (!) 110  Resp:  16 17 (!) 22  Temp:  98.7 F (37.1 C) 98.3 F (36.8 C) 98.3 F (36.8 C)  TempSrc:  Oral  Oral  SpO2: 92% 94% 94% 91%  Weight:      Height:       LUE - in sling, all fingers flex, extend and abduct. Surgical dressing CDI. Distal sensation intact. Reports sensation intact to axillary nerve distribution. 2+ radial pulse   LABS  Results for orders placed or performed during the hospital encounter of 06/20/23 (from the past 24 hours)  Glucose, capillary     Status: Abnormal   Collection Time: 06/22/23  1:26 PM  Result Value Ref Range   Glucose-Capillary 124 (H) 70 - 99 mg/dL   Comment 1 Notify RN   Glucose, capillary     Status: Abnormal   Collection Time: 06/22/23  5:51 PM  Result Value Ref Range   Glucose-Capillary 115 (H) 70 - 99 mg/dL  Glucose, capillary     Status: Abnormal   Collection Time: 06/22/23  8:07 PM  Result Value Ref Range   Glucose-Capillary 356 (H) 70 - 99 mg/dL  Glucose, capillary     Status: Abnormal   Collection Time: 06/22/23 11:01 PM  Result Value Ref Range   Glucose-Capillary 348 (H) 70 - 99 mg/dL  CBC     Status: Abnormal   Collection Time: 06/23/23  4:32 AM  Result Value Ref Range   WBC 6.1 4.0 - 10.5 K/uL   RBC 3.36 (L) 4.22 - 5.81 MIL/uL   Hemoglobin 9.0 (L) 13.0 - 17.0 g/dL   HCT 95.6 (L) 21.3 - 08.6 %   MCV 90.2 80.0 - 100.0 fL   MCH 26.8 26.0 - 34.0 pg   MCHC 29.7 (L) 30.0 - 36.0 g/dL   RDW 57.8 46.9 - 62.9 %   Platelets 197 150 - 400 K/uL   nRBC 0.0 0.0 - 0.2 %   Basic metabolic panel     Status: Abnormal   Collection Time: 06/23/23  4:32 AM  Result Value Ref Range   Sodium 136 135 - 145 mmol/L   Potassium 5.8 (H) 3.5 - 5.1 mmol/L   Chloride 106 98 - 111 mmol/L   CO2 22 22 - 32 mmol/L   Glucose, Bld 260 (H) 70 - 99 mg/dL   BUN 24 (H) 8 - 23 mg/dL   Creatinine, Ser 5.28 0.61 - 1.24 mg/dL   Calcium 7.4 (L) 8.9 - 10.3 mg/dL   GFR, Estimated >41 >32 mL/min   Anion gap 8 5 - 15  Potassium     Status: None   Collection Time: 06/23/23  6:41 AM  Result Value Ref Range   Potassium 4.7 3.5 - 5.1 mmol/L  Glucose, capillary     Status: Abnormal   Collection Time: 06/23/23  7:21 AM  Result Value Ref Range   Glucose-Capillary 282 (H) 70 - 99 mg/dL    DG Shoulder Left Port Result Date: 06/22/2023 CLINICAL DATA:  Closed  fracture of left proximal humerus, postop. EXAM: LEFT SHOULDER COMPARISON:  Preoperative imaging. FINDINGS: Lateral plate and screw fixation of proximal humeral fracture. Improved fracture alignment from preoperative imaging. Recent postsurgical change includes air and edema in the soft tissues. IMPRESSION: ORIF of proximal humeral fracture. Electronically Signed   By: Narda Rutherford M.D.   On: 06/22/2023 19:07   DG Shoulder Left Result Date: 06/22/2023 CLINICAL DATA:  Elective surgery. EXAM: LEFT SHOULDER - 2+ VIEW COMPARISON:  Radiograph 06/20/2023 FINDINGS: Ten fluoroscopic spot views of the left shoulder submitted from the operating room. Lateral plate and multi screw fixation of proximal humeral fracture. Fluoroscopy time 54 seconds. Dose 5.3 mGy. IMPRESSION: Intraoperative fluoroscopy during proximal humeral fracture fixation. Electronically Signed   By: Narda Rutherford M.D.   On: 06/22/2023 17:13   DG C-Arm 1-60 Min-No Report Result Date: 06/22/2023 Fluoroscopy was utilized by the requesting physician.  No radiographic interpretation.   DG C-Arm 1-60 Min-No Report Result Date: 06/22/2023 Fluoroscopy was utilized by the requesting  physician.  No radiographic interpretation.    Assessment/Plan: Left proximal humerus fracture   1 Day Post-Op s/p Procedure(s): OPEN REDUCTION INTERNAL FIXATION (ORIF) PROXIMAL HUMERUS FRACTURE  Weightbearing: NWB LUE, up with PT/OT Insicional and dressing care: Reinforce dressings as needed Orthopedic device(s): sling at all times VTE prophylaxis: none needed on discharge from ortho respective Pain control: continue current regimen, will try to decrease narcotics as much as possible due to confusion Follow - up plan: 2 weeks with Dr. Dion Saucier Dispo: pending PT/OT eval today  Addendum: PT/OT eval completed. Ok to discharge to SNF from ortho standpoint when cleared by medicine. Will see again on Monday if still inpatient, otherwise please reach out to on call provider over the weekend if any orthopedic questions or concerns.   Contact information:   Janine Ores, Cordelia Poche [id number]  After hours and holidays please check [email or web address] for group call information for [facility]  Armida Sans 06/23/2023, 11:06 AM

## 2023-06-24 DIAGNOSIS — N390 Urinary tract infection, site not specified: Secondary | ICD-10-CM | POA: Diagnosis not present

## 2023-06-24 DIAGNOSIS — A419 Sepsis, unspecified organism: Secondary | ICD-10-CM | POA: Diagnosis not present

## 2023-06-24 LAB — COMPREHENSIVE METABOLIC PANEL WITH GFR
ALT: 16 U/L (ref 0–44)
AST: 14 U/L — ABNORMAL LOW (ref 15–41)
Albumin: 2.4 g/dL — ABNORMAL LOW (ref 3.5–5.0)
Alkaline Phosphatase: 61 U/L (ref 38–126)
Anion gap: 7 (ref 5–15)
BUN: 25 mg/dL — ABNORMAL HIGH (ref 8–23)
CO2: 29 mmol/L (ref 22–32)
Calcium: 8.3 mg/dL — ABNORMAL LOW (ref 8.9–10.3)
Chloride: 99 mmol/L (ref 98–111)
Creatinine, Ser: 0.89 mg/dL (ref 0.61–1.24)
GFR, Estimated: >60 mL/min (ref >60)
Glucose, Bld: 240 mg/dL — ABNORMAL HIGH (ref 70–99)
Potassium: 4.4 mmol/L (ref 3.5–5.1)
Sodium: 135 mmol/L (ref 135–145)
Total Bilirubin: 0.4 mg/dL (ref 0.0–1.2)
Total Protein: 5.3 g/dL — ABNORMAL LOW (ref 6.5–8.1)

## 2023-06-24 LAB — CBC
HCT: 33.1 % — ABNORMAL LOW (ref 39.0–52.0)
Hemoglobin: 9.9 g/dL — ABNORMAL LOW (ref 13.0–17.0)
MCH: 26.6 pg (ref 26.0–34.0)
MCHC: 29.9 g/dL — ABNORMAL LOW (ref 30.0–36.0)
MCV: 89 fL (ref 80.0–100.0)
Platelets: 266 10*3/uL (ref 150–400)
RBC: 3.72 MIL/uL — ABNORMAL LOW (ref 4.22–5.81)
RDW: 14.7 % (ref 11.5–15.5)
WBC: 11.5 10*3/uL — ABNORMAL HIGH (ref 4.0–10.5)
nRBC: 0 % (ref 0.0–0.2)

## 2023-06-24 LAB — PHOSPHORUS: Phosphorus: 2 mg/dL — ABNORMAL LOW (ref 2.5–4.6)

## 2023-06-24 LAB — GLUCOSE, CAPILLARY
Glucose-Capillary: 245 mg/dL — ABNORMAL HIGH (ref 70–99)
Glucose-Capillary: 257 mg/dL — ABNORMAL HIGH (ref 70–99)
Glucose-Capillary: 252 mg/dL — ABNORMAL HIGH (ref 70–99)
Glucose-Capillary: 349 mg/dL — ABNORMAL HIGH (ref 70–99)

## 2023-06-24 LAB — MAGNESIUM: Magnesium: 1.8 mg/dL (ref 1.7–2.4)

## 2023-06-24 MED ORDER — FLUCONAZOLE 200 MG PO TABS: 200.0000 mg | ORAL_TABLET | Freq: Every day | ORAL | Status: DC

## 2023-06-24 MED ORDER — SODIUM PHOSPHATES 45 MMOLE/15ML IV SOLN
15.0000 mmol | Freq: Once | INTRAVENOUS | Status: AC
  Administered 2023-06-24: 15 mmol via INTRAVENOUS
  Filled 2023-06-24: qty 5

## 2023-06-24 MED ORDER — CEFPODOXIME PROXETIL 200 MG PO TABS: 200.0000 mg | ORAL_TABLET | Freq: Two times a day (BID) | ORAL | Status: DC

## 2023-06-24 MED ORDER — OXYCODONE HCL 5 MG PO TABS: 5.0000 mg | ORAL_TABLET | ORAL | 0 refills | Status: AC | PRN

## 2023-06-24 NOTE — Progress Notes (Signed)
  2030 Bladder irrigated per order.  removed.  Flow noted at foley catheter.  Continue to monitor.

## 2023-06-24 NOTE — Progress Notes (Signed)
 PROGRESS NOTE  Dustin Duffy XBJ:[id number] DOB: 07-19-1955 DOA: 06/20/2023 PCP: Irena Reichmann, DO   LOS: 4 days   Brief Narrative / Interim history: 68 year old male with DM2, BPH with urinary obstruction with chronic Foley catheter, CKD 2, HTN, HLD comes into the hospital after a fall.  He was walking from his kitchen to the living room when all of a sudden his legs gave out and fell on the left side.  He had left shoulder pain afterwards.  He presented to the ER, was found to have a UTI and x-ray of the shoulder showed acute displaced fracture of the proximal left humerus.  Orthopedic surgery was consulted and he was admitted to the hospital  Subjective / 24h Interval events: Doing well, awaiting SNF  Assesement and Plan: Principal problem Sepsis due to UTI -met sepsis criteria with tachycardia, tachypnea, elevated lactic acid, although latter could have been in the setting of recent fall.  Urine culture sent and growing 70^5 yeast, patient was started on ceftriaxone as well as fluconazole, continue for now perioperatively.  Will plan for a total of 5 days, continue antibiotics, today is day #4 -remains afebrile -change foley today due to leak  Active problems Proximal L humerus fracture -orthopedic surgery consulted, status post-ORIF 3/27.  PT/OT recommends SNF, he is stable for discharge   Frequent falls -admitted in January for fall suspected to be 2/2 UTI.  PT/OT consulted, SNF pending.  Sinus tachycardia - Heart rates ranging in the 100s to 130s since arrival to the ED.  EKG showing sinus tachycardia.  Suspect secondary to underlying infection and pain from humerus fracture.  Improved    Hypertension, history of hypotension - Patient with history of hypotension thought to be 2/2 autonomic dysfunction stemming from T2DM. He is on midodrine 5 mg 3 times daily at home along with fludrocortisone 0.1mg  daily.  Continue. BP stable   T2DM - On xigduo 10-500mg  daily at home. Question  whether SGLT-2i component is leading to recurrent UTIs. Will hold home xigduo. A1c 8.0% in 04/12/2023.  Continue sliding scale   HLD -resume home zocor 20mg  qhs   BPH with urinary obstruction s/p chronic indwelling Foley catheter -follows Dr. Alvester Morin, [facility]. Foley catheter switched on arrival to ED. Continue home mirabegron 50mg  daily  Hypomagnesemia-replace magnesium and continue to monitor  CKD Stage 2 -kidney function stable and at baseline  Anemia of chronic disease-hemoglobin stable  Scheduled Meds:  amitriptyline  50 mg Oral QHS   Chlorhexidine Gluconate Cloth  6 each Topical Daily   docusate sodium  100 mg Oral BID   fluconazole  200 mg Oral QHS   fludrocortisone  0.1 mg Oral Daily   gabapentin  900 mg Oral QHS   insulin aspart  0-5 Units Subcutaneous QHS   insulin aspart  0-9 Units Subcutaneous TID WC   midodrine  5 mg Oral TID WC   mirabegron ER  50 mg Oral Daily   mupirocin ointment  1 Application Nasal BID   simvastatin  20 mg Oral QHS   Continuous Infusions:  cefTRIAXone (ROCEPHIN)  IV 1 g (06/24/23 1247)   sodium phosphate 15 mmol in sodium chloride 0.9 % 250 mL infusion 15 mmol (06/24/23 1050)   PRN Meds:.acetaminophen, alum & mag hydroxide-simeth, bisacodyl, diphenhydrAMINE, HYDROmorphone (DILAUDID) injection, lip balm, magnesium citrate, menthol-cetylpyridinium **OR** phenol, methocarbamol **OR** methocarbamol (ROBAXIN) injection, metoCLOPramide **OR** metoCLOPramide (REGLAN) injection, ondansetron **OR** ondansetron (ZOFRAN) IV, oxyCODONE, oxyCODONE, senna-docusate  Current Outpatient Medications  Medication Instructions   acetaminophen (  TYLENOL) 650 mg, Oral, Every 6 hours PRN   amitriptyline (ELAVIL) 50 mg, Oral, Daily at bedtime   aspirin EC 81 mg, Oral, Every morning, Swallow whole.   cefpodoxime (VANTIN) 200 mg, Oral, 2 times daily   cholecalciferol (VITAMIN D3) 1,000 Units, Oral, Every M-W-F   fluconazole (DIFLUCAN) 200 mg, Oral, Daily at bedtime    fludrocortisone (FLORINEF) 0.1 mg, Oral, Daily   gabapentin (NEURONTIN) 900 mg, Oral, Daily at bedtime   ipratropium (ATROVENT) 0.03 % nasal spray 2 sprays, Each Nare, 2 times daily PRN   Januvia 50 mg, Every morning   midodrine (PROAMATINE) 5 mg, Oral, 3 times daily with meals   Myrbetriq 50 mg, Oral, Every morning   oxyCODONE (ROXICODONE) 5 mg, Oral, Every 4 hours PRN   simvastatin (ZOCOR) 20 mg, Oral, Every evening, Do not take this medication until you finish Diflucan.   Water For Irrigation, Sterile (STERILE WATER) Irrigation, See admin instructions, Mix sterile water with white vinegar 4:1 and flush indwelling catheter nightly before bedtime    XIGDUO XR 10-500 MG TB24 1 tablet, Daily before breakfast    Diet Orders (From admission, onward)     Start     Ordered   06/23/23 0621  Diet Carb Modified Fluid consistency: Thin; Room service appropriate? Yes  Diet effective now       Question Answer Comment  Diet-HS Snack? Nothing   Calorie Level Medium 1600-2000   Fluid consistency: Thin   Room service appropriate? Yes      06/23/23 0620            DVT prophylaxis: SCD's Start: 06/22/23 1829 Place and maintain sequential compression device Start: 06/20/23 2044   Lab Results  Component Value Date   PLT 266 06/24/2023      Code Status: Full Code  Family Communication: No family at bedside  Status is: Inpatient Remains inpatient appropriate because: severity of illness  Level of care: Telemetry  Consultants:  Orthopedic surgery   Objective: Vitals:   06/23/23 2000 06/24/23 0530 06/24/23 1227 06/24/23 1247  BP: 132/83 (!) 135/90 (!) 187/99 (!) 146/80  Pulse: 99 96 99   Resp: 20 (!) 22 19 20   Temp: 98 F (36.7 C) 98.2 F (36.8 C) 98.4 F (36.9 C)   TempSrc: Oral Oral Oral   SpO2: 97% 96% 94% 94%  Weight:      Height:        Intake/Output Summary (Last 24 hours) at 06/24/2023 1628 Last data filed at 06/24/2023 1500 Gross per 24 hour  Intake 756.74 ml   Output 3300 ml  Net -2543.26 ml   Wt Readings from Last 3 Encounters:  06/22/23 53.7 kg  04/12/23 63 kg  06/06/22 63.5 kg    Examination:  Constitutional: NAD Eyes: lids and conjunctivae normal, no scleral icterus ENMT: mmm Neck: normal, supple Respiratory: clear to auscultation bilaterally, no wheezing, no crackles.  Cardiovascular: Regular rate and rhythm, no murmurs / rubs / gallops. No LE edema. Abdomen: soft, no distention, no tenderness. Bowel sounds positive.   Data Reviewed: I have independently reviewed following labs and imaging studies   CBC Recent Labs  Lab 06/20/23 1316 06/21/23 0504 06/22/23 0521 06/23/23 0432 06/24/23 0612  WBC 8.9 6.6 7.7 6.1 11.5*  HGB 12.9* 10.0* 9.9* 9.0* 9.9*  HCT 42.9 32.4* 32.8* 30.3* 33.1*  PLT 234 202 207 197 266  MCV 86.8 87.1 87.7 90.2 89.0  MCH 26.1 26.9 26.5 26.8 26.6  MCHC 30.1 30.9 30.2 29.7* 29.9*  RDW 15.3 15.4 15.0 15.0 14.7  LYMPHSABS 0.7  --   --   --   --   MONOABS 0.8  --   --   --   --   EOSABS 0.0  --   --   --   --   BASOSABS 0.1  --   --   --   --     Recent Labs  Lab 06/20/23 1316 06/20/23 1835 06/20/23 2138 06/21/23 0114 06/21/23 0504 06/22/23 0521 06/23/23 0432 06/23/23 0641 06/24/23 0612  NA 141  --   --   --  137 136 136  --  135  K 4.0  --   --   --  4.0 3.7 5.8* 4.7 4.4  CL 101  --   --   --  101 100 106  --  99  CO2 32  --   --   --  27 28 22   --  29  GLUCOSE 181*  --   --   --  112* 167* 260*  --  240*  BUN 20  --   --   --  22 25* 24*  --  25*  CREATININE 1.18  --   --   --  1.08 0.97 0.87  --  0.89  CALCIUM 9.0  --   --   --  8.4* 8.3* 7.4*  --  8.3*  AST  --   --   --   --   --  13*  --   --  14*  ALT  --   --   --   --   --  13  --   --  16  ALKPHOS  --   --   --   --   --  52  --   --  61  BILITOT  --   --   --   --   --  0.6  --   --  0.4  ALBUMIN  --   --   --   --   --  2.1*  --   --  2.4*  MG  --   --   --   --   --  1.6*  --   --  1.8  LATICACIDVEN  --  >9.0* 1.1 0.9  --    --   --   --   --     ------------------------------------------------------------------------------------------------------------------ No results for input(s): "CHOL", "HDL", "LDLCALC", "TRIG", "CHOLHDL", "LDLDIRECT" in the last 72 hours.  Lab Results  Component Value Date   HGBA1C 8.0 (H) 04/12/2023   ------------------------------------------------------------------------------------------------------------------ No results for input(s): "TSH", "T4TOTAL", "T3FREE", "THYROIDAB" in the last 72 hours.  Invalid input(s): "FREET3"  Cardiac Enzymes No results for input(s): "CKMB", "TROPONINI", "MYOGLOBIN" in the last 168 hours.  Invalid input(s): "CK" ------------------------------------------------------------------------------------------------------------------ No results found for: "BNP"  CBG: Recent Labs  Lab 06/23/23 1129 06/23/23 1653 06/23/23 2133 06/24/23 0739 06/24/23 1136  GLUCAP 314* 282* 360* 245* 257*    Recent Results (from the past 240 hours)  Urine Culture     Status: Abnormal   Collection Time: 06/20/23 12:14 PM   Specimen: Urine, Random  Result Value Ref Range Status   Specimen Description   Final    URINE, RANDOM Performed at [facility], [address] [place] [address] [place], [place] [address]    Special Requests   Final    NONE Reflexed from [id number] Performed at [facility], [address], [place], [place] [address]    Culture (A)  Final    70,000 COLONIES/mL YEAST Performed at [facility], [address] [place], [place] [address]    Report Status 06/21/2023 FINAL  Final  Culture, blood (Routine X 2) w Reflex to ID Panel     Status: None (Preliminary result)   Collection Time: 06/20/23  8:05 PM   Specimen: BLOOD  Result Value Ref Range Status   Specimen Description   Final    BLOOD BLOOD LEFT HAND Performed at [facility], [address], [place], [place] [address]    Special  Requests   Final    BOTTLES DRAWN AEROBIC AND ANAEROBIC Blood Culture results may not be optimal due to an inadequate volume of blood received in culture bottles Performed at [facility], [address], [place], [place] [address]    Culture   Final    NO GROWTH 4 DAYS Performed at [facility], [address] [place], [place] [address]    Report Status PENDING  Incomplete  Culture, blood (Routine X 2) w Reflex to ID Panel     Status: None (Preliminary result)   Collection Time: 06/20/23  8:05 PM   Specimen: BLOOD  Result Value Ref Range Status   Specimen Description   Final    BLOOD RIGHT ANTECUBITAL Performed at [facility], [address], [place], [place] [address]    Special Requests   Final    BOTTLES DRAWN AEROBIC AND ANAEROBIC Blood Culture results may not be optimal due to an inadequate volume of blood received in culture bottles Performed at [facility], [address] [place], [place] [address]    Culture   Final    NO GROWTH 4 DAYS Performed at [facility], [address] [place], [place] [address]    Report Status PENDING  Incomplete  Surgical PCR screen     Status: None   Collection Time: 06/21/23  3:24 PM   Specimen: Nasal Mucosa; Nasal Swab  Result Value Ref Range Status   MRSA, PCR NEGATIVE NEGATIVE Final   Staphylococcus aureus NEGATIVE NEGATIVE Final    Comment: (NOTE) The Xpert SA Assay (FDA approved for NASAL specimens in patients 29 years of age and older), is one component of a comprehensive surveillance program. It is not intended to diagnose infection nor to guide or monitor treatment. Performed at [facility], [address] [place], [place] [address]      Radiology Studies: No results found.     Pamella Pert, MD, PhD [facility]  Between 7 am - 7 pm I am available, please contact me via Amion (for emergencies) or Securechat (non urgent  messages)  Between 7 pm - 7 am I am not available, please contact night coverage MD/APP via Amion

## 2023-06-24 NOTE — Progress Notes (Signed)
 Foley catheter leaking. Balloon was deflated and than re-inflated, however is still leaking. Notified MD, order placed to be exchanged. New Foley placed, patient report no discomfort,clear and yellow urine is draining. Will continue to monitor.

## 2023-06-24 NOTE — TOC Progression Note (Addendum)
 Transition of Care [facility]) - Progression Note    Patient Details  Name: Dustin Duffy MRN: [id number] Date of Birth: [birth year]  Transition of Care [facility] CM/SW Contact  Adrian Prows, RN Phone Number: 06/24/2023, 5:45 PM  Clinical Narrative:    Pt given list of bed offers; pt says he will call his friend to discuss bed offers: -[facility]  -[facility]  -[facility]  -[facility]  -[facility]  -[facility]  -[facility]  -[facility]  -[facility]  -[facility]  -[facility]   awaiting choice. Expected Discharge Plan: Skilled Nursing Facility Barriers to Discharge: Continued Medical Work up  Expected Discharge Plan and Services   Discharge Planning Services: CM Consult Post Acute Care Choice: Home Health Living arrangements for the past 2 months: Single Family Home                           HH Arranged: PT, OT HH Agency: [facility] Date [facility] Agency Contacted: 06/21/23 Time HH Agency Contacted: 1431 Representative spoke with at [facility] Agency: Haywood Lasso   Social Determinants of Health (SDOH) Interventions SDOH Screenings   Food Insecurity: No Food Insecurity (06/21/2023)  Housing: Low Risk  (06/21/2023)  Transportation Needs: Unmet Transportation Needs (06/21/2023)  Utilities: Not At Risk (06/21/2023)  Depression (PHQ2-9): Low Risk  (04/02/2021)  Social Connections: Socially Isolated (06/21/2023)  Tobacco Use: Medium Risk (06/22/2023)    Readmission Risk Interventions    05/02/2022   10:09 AM  Readmission Risk Prevention Plan  Transportation Screening Complete  PCP or Specialist Appt within 5-7 Days Complete  Home Care Screening Complete  Medication Review (RN CM) Complete

## 2023-06-24 NOTE — Progress Notes (Signed)
 Physical Therapy Treatment Patient Details Name: Dustin Duffy MRN: [id number] DOB: 1955/09/29 Today's Date: 06/24/2023   History of Present Illness Patient is a 68 year old male who presented after a fall at home resulting in L shoulder pain. Patient was admitted with UTI, proximal L humerus fracture, sinus tachycardia. Patient is s/p L UE ORIF on 06/22/23.   PMH: urology 04/28/22 with UTI, DM II, HLD, BPH,T12 wedge compression fracture, legally blind,    PT Comments  Pt making good progress today but OOB activity was limited due to foley catheter leaking.  He did ambulate 24' with hemiwalker and min A for balance then had to return to bed due to leaking catheter.  Did require multiple transfers for cleaning and getting bed changed.  Pt does remain high fall risk and limited support at home.  Continue to recommend Patient will benefit from continued inpatient follow up therapy, <3 hours/day at d/c.  Pt is now agreeable.     If plan is discharge home, recommend the following: A lot of help with walking and/or transfers;A lot of help with bathing/dressing/bathroom;Assistance with cooking/housework;Help with stairs or ramp for entrance   Can travel by private vehicle     Yes  Equipment Recommendations  Other (comment) Psychologist, educational)    Recommendations for Other Services       Precautions / Restrictions Precautions Precautions: Fall;Shoulder Precaution/Restrictions Comments: Sling at all times except ADL/exercise Yes  Non weight bearing Yes  AROM elbow, wrist and hand to tolerance Yes  PROM of shoulder No  AROM of shoulder No Restrictions LUE Weight Bearing Per Provider Order: Non weight bearing     Mobility  Bed Mobility Overal bed mobility: Needs Assistance Bed Mobility: Supine to Sit, Sit to Supine     Supine to sit: Min assist, Used rails Sit to supine: Min assist, Used rails   General bed mobility comments: Increased time and effort but only min A; cues to not push with L UE     Transfers Overall transfer level: Needs assistance Equipment used: 1 person hand held assist, Hemi-walker Transfers: Sit to/from Stand, Bed to chair/wheelchair/BSC Sit to Stand: Min assist   Step pivot transfers: Mod assist       General transfer comment: STS x 4 during session with cues for hand placement.  Stood with HHA x2 and hemiwalkerx2.  Performed step pivot to and from recliner with HHA and mod A for balance    Ambulation/Gait Ambulation/Gait assistance: Min assist Gait Distance (Feet): 40 Feet Assistive device: Hemi-walker Gait Pattern/deviations: Step-to pattern, Decreased stride length Gait velocity: decreased     General Gait Details: Cues for hemiwalker placement and getting legs flat on floor; min A for balance; improved from yesterday with no major posterior loss of balance but still unsteady; distance limited due to foley catheter leaking and had to return to room.   Stairs             Wheelchair Mobility     Tilt Bed    Modified Rankin (Stroke Patients Only)       Balance Overall balance assessment: Needs assistance Sitting-balance support: No upper extremity supported Sitting balance-Leahy Scale: Fair Sitting balance - Comments: could static sit; posterior lean with challenges   Standing balance support: Single extremity supported Standing balance-Leahy Scale: Poor Standing balance comment: R UE support and min A  Communication    Cognition Arousal: Alert Behavior During Therapy: WFL for tasks assessed/performed   PT - Cognitive impairments: Sequencing                       PT - Cognition Comments: cues for sequencing wtih transfers not using L UE        Cueing    Exercises General Exercises - Lower Extremity Ankle Circles/Pumps: AROM, Both, 10 reps, Seated Long Arc Quad: AROM, Both, 10 reps, Seated Hip Flexion/Marching: AROM, Both, 10 reps, Seated Other Exercises Other  Exercises: 10 reps AROM : finger flex/ext, wrist flex/ext, supination/pronation; 10 reps AAROM elbow flex/ext with cues for full ext    General Comments General comments (skin integrity, edema, etc.): Pt on 3 L O2      Pertinent Vitals/Pain Pain Assessment Pain Assessment: Faces Faces Pain Scale: Hurts a little bit Pain Location: L shoulder Pain Descriptors / Indicators: Guarding Pain Intervention(s): Limited activity within patient's tolerance, Monitored during session, Repositioned    Home Living                          Prior Function            PT Goals (current goals can now be found in the care plan section) Progress towards PT goals: Progressing toward goals    Frequency    Min 3X/week      PT Plan      Co-evaluation              AM-PAC PT "6 Clicks" Mobility   Outcome Measure  Help needed turning from your back to your side while in a flat bed without using bedrails?: A Lot Help needed moving from lying on your back to sitting on the side of a flat bed without using bedrails?: A Lot Help needed moving to and from a bed to a chair (including a wheelchair)?: A Lot Help needed standing up from a chair using your arms (e.g., wheelchair or bedside chair)?: A Lot Help needed to walk in hospital room?: A Lot Help needed climbing 3-5 steps with a railing? : Total 6 Click Score: 11    End of Session Equipment Utilized During Treatment: Gait belt;Other (comment) (sling) Activity Tolerance: Patient tolerated treatment well;Other (comment) (Distance and standing activity limited due to foley catheter leaking) Patient left: with call bell/phone within reach;in bed;with bed alarm set (SCD not applied b/c soiled in urine , notified RN) Nurse Communication: Mobility status PT Visit Diagnosis: Unsteadiness on feet (R26.81);History of falling (Z91.81);Muscle weakness (generalized) (M62.81)     Time: 1355-1430 PT Time Calculation (min) (ACUTE ONLY): 35  min  Charges:    $Gait Training: 8-22 mins $Therapeutic Activity: 8-22 mins PT General Charges $$ ACUTE PT VISIT: 1 Visit                     Anise Salvo, PT Acute Rehab [facility] [phone]    Rayetta Humphrey 06/24/2023, 2:52 PM

## 2023-06-25 ENCOUNTER — Inpatient Hospital Stay (HOSPITAL_COMMUNITY)

## 2023-06-25 DIAGNOSIS — A419 Sepsis, unspecified organism: Secondary | ICD-10-CM | POA: Diagnosis not present

## 2023-06-25 DIAGNOSIS — N390 Urinary tract infection, site not specified: Secondary | ICD-10-CM | POA: Diagnosis not present

## 2023-06-25 LAB — BASIC METABOLIC PANEL WITH GFR
Anion gap: 7 (ref 5–15)
BUN: 26 mg/dL — ABNORMAL HIGH (ref 8–23)
CO2: 33 mmol/L — ABNORMAL HIGH (ref 22–32)
Calcium: 8.4 mg/dL — ABNORMAL LOW (ref 8.9–10.3)
Chloride: 97 mmol/L — ABNORMAL LOW (ref 98–111)
Creatinine, Ser: 0.98 mg/dL (ref 0.61–1.24)
GFR, Estimated: >60 mL/min
Glucose, Bld: 334 mg/dL — ABNORMAL HIGH (ref 70–99)
Potassium: 4.1 mmol/L (ref 3.5–5.1)
Sodium: 137 mmol/L (ref 135–145)

## 2023-06-25 LAB — GLUCOSE, CAPILLARY
Glucose-Capillary: 172 mg/dL — ABNORMAL HIGH (ref 70–99)
Glucose-Capillary: 353 mg/dL — ABNORMAL HIGH (ref 70–99)
Glucose-Capillary: 265 mg/dL — ABNORMAL HIGH (ref 70–99)
Glucose-Capillary: 320 mg/dL — ABNORMAL HIGH (ref 70–99)

## 2023-06-25 LAB — CULTURE, BLOOD (ROUTINE X 2)
Culture: NO GROWTH
Culture: NO GROWTH

## 2023-06-25 LAB — D-DIMER, QUANTITATIVE: D-Dimer, Quant: 3.45 ug{FEU}/mL — ABNORMAL HIGH (ref 0.00–0.50)

## 2023-06-25 MED ORDER — SODIUM CHLORIDE (PF) 0.9 % IJ SOLN
INTRAMUSCULAR | Status: AC
  Filled 2023-06-25: qty 50

## 2023-06-25 MED ORDER — IOHEXOL 350 MG/ML SOLN
100.0000 mL | Freq: Once | INTRAVENOUS | Status: AC | PRN
  Administered 2023-06-26: 100 mL via INTRAVENOUS

## 2023-06-25 NOTE — Progress Notes (Signed)
 PROGRESS NOTE  Dustin PETROSINO ZOX:[id number] DOB: January 27, 1956 DOA: 06/20/2023 PCP: Irena Reichmann, DO   LOS: 5 days   Brief Narrative / Interim history: 68 year old male with DM2, BPH with urinary obstruction with chronic Foley catheter, CKD 2, HTN, HLD comes into the hospital after a fall.  He was walking from his kitchen to the living room when all of a sudden his legs gave out and fell on the left side.  He had left shoulder pain afterwards.  He presented to the ER, was found to have a UTI and x-ray of the shoulder showed acute displaced fracture of the proximal left humerus.  Orthopedic surgery was consulted and he was admitted to the hospital  Subjective / 24h Interval events: Denies any chest pain, denies any shortness of breath.  Assesement and Plan: Principal problem Sepsis due to UTI -met sepsis criteria with tachycardia, tachypnea, elevated lactic acid, although latter could have been in the setting of recent fall.  Urine culture sent and growing 70^5 yeast, patient was started on ceftriaxone as well as fluconazole, continue for now perioperatively.  Will plan for a total of 5 days, continue antibiotics, today is day #4 -Remains afebrile  Active problems Proximal L humerus fracture -orthopedic surgery consulted, status post-ORIF 3/27.  PT/OT recommends SNF, he is stable for discharge  Acute hypoxic respiratory failure -required oxygen postoperatively, has slightly increased oxygen needs today up to 6 L now obtain chest x-ray, screening for PE with a D-dimer and may need CT angiogram if positive.  He is also more tachycardic.  He is on antibiotics as per #1   Frequent falls -admitted in January for fall suspected to be 2/2 UTI.  PT/OT consulted, SNF pending.  Sinus tachycardia - Heart rates ranging in the 100s to 130s since arrival to the ED.  EKG showing sinus tachycardia.  Suspect secondary to underlying infection and pain from humerus fracture.  More tachycardic today, rule out PE as  above   Hypertension, history of hypotension - Patient with history of hypotension thought to be 2/2 autonomic dysfunction stemming from T2DM. He is on midodrine 5 mg 3 times daily at home along with fludrocortisone 0.1mg  daily.  Continue. BP stable   T2DM - On xigduo 10-500mg  daily at home. Question whether SGLT-2i component is leading to recurrent UTIs. Will hold home xigduo. A1c 8.0% in 04/12/2023.  Continue sliding scale   HLD -resume home zocor 20mg  qhs   BPH with urinary obstruction s/p chronic indwelling Foley catheter -follows Dr. Alvester Morin, [facility]. Foley catheter switched on arrival to ED. Continue home mirabegron 50mg  daily  Hypomagnesemia-labs pending today  CKD Stage 2 -kidney function stable and at baseline, labs pending today, will follow-up  Anemia of chronic disease-hemoglobin stable  Scheduled Meds:  amitriptyline  50 mg Oral QHS   Chlorhexidine Gluconate Cloth  6 each Topical Daily   docusate sodium  100 mg Oral BID   fluconazole  200 mg Oral QHS   fludrocortisone  0.1 mg Oral Daily   gabapentin  900 mg Oral QHS   insulin aspart  0-5 Units Subcutaneous QHS   insulin aspart  0-9 Units Subcutaneous TID WC   midodrine  5 mg Oral TID WC   mirabegron ER  50 mg Oral Daily   mupirocin ointment  1 Application Nasal BID   simvastatin  20 mg Oral QHS   Continuous Infusions:  cefTRIAXone (ROCEPHIN)  IV 1 g (06/25/23 1423)   PRN Meds:.acetaminophen, alum & mag hydroxide-simeth, bisacodyl, diphenhydrAMINE,  HYDROmorphone (DILAUDID) injection, lip balm, magnesium citrate, menthol-cetylpyridinium **OR** phenol, methocarbamol **OR** methocarbamol (ROBAXIN) injection, metoCLOPramide **OR** metoCLOPramide (REGLAN) injection, ondansetron **OR** ondansetron (ZOFRAN) IV, oxyCODONE, oxyCODONE, senna-docusate  Current Outpatient Medications  Medication Instructions   acetaminophen (TYLENOL) 650 mg, Oral, Every 6 hours PRN   amitriptyline (ELAVIL) 50 mg, Oral, Daily at bedtime    aspirin EC 81 mg, Oral, Every morning, Swallow whole.   cefpodoxime (VANTIN) 200 mg, Oral, 2 times daily   cholecalciferol (VITAMIN D3) 1,000 Units, Oral, Every M-W-F   fluconazole (DIFLUCAN) 200 mg, Oral, Daily at bedtime   fludrocortisone (FLORINEF) 0.1 mg, Oral, Daily   gabapentin (NEURONTIN) 900 mg, Oral, Daily at bedtime   ipratropium (ATROVENT) 0.03 % nasal spray 2 sprays, Each Nare, 2 times daily PRN   Januvia 50 mg, Every morning   midodrine (PROAMATINE) 5 mg, Oral, 3 times daily with meals   Myrbetriq 50 mg, Oral, Every morning   oxyCODONE (ROXICODONE) 5 mg, Oral, Every 4 hours PRN   simvastatin (ZOCOR) 20 mg, Oral, Every evening, Do not take this medication until you finish Diflucan.   Water For Irrigation, Sterile (STERILE WATER) Irrigation, See admin instructions, Mix sterile water with white vinegar 4:1 and flush indwelling catheter nightly before bedtime    XIGDUO XR 10-500 MG TB24 1 tablet, Daily before breakfast    Diet Orders (From admission, onward)     Start     Ordered   06/23/23 0621  Diet Carb Modified Fluid consistency: Thin; Room service appropriate? Yes  Diet effective now       Question Answer Comment  Diet-HS Snack? Nothing   Calorie Level Medium 1600-2000   Fluid consistency: Thin   Room service appropriate? Yes      06/23/23 0620            DVT prophylaxis: SCD's Start: 06/22/23 1829 Place and maintain sequential compression device Start: 06/20/23 2044   Lab Results  Component Value Date   PLT 266 06/24/2023      Code Status: Full Code  Family Communication: No family at bedside  Status is: Inpatient Remains inpatient appropriate because: severity of illness  Level of care: Telemetry  Consultants:  Orthopedic surgery   Objective: Vitals:   06/24/23 1247 06/24/23 2054 06/25/23 0443 06/25/23 1340  BP: (!) 146/80 127/81 (!) 135/92 (!) 147/81  Pulse:  (!) 115 (!) 106 (!) 128  Resp: 20 16 16  (!) 22  Temp:  98.3 F (36.8 C) 98.4 F  (36.9 C) 98.9 F (37.2 C)  TempSrc:  Oral Oral Oral  SpO2: 94% 96% 92% 91%  Weight:      Height:        Intake/Output Summary (Last 24 hours) at 06/25/2023 1426 Last data filed at 06/25/2023 1000 Gross per 24 hour  Intake 756.74 ml  Output 3450 ml  Net -2693.26 ml   Wt Readings from Last 3 Encounters:  06/22/23 53.7 kg  04/12/23 63 kg  06/06/22 63.5 kg    Examination:  Constitutional: NAD Eyes: lids and conjunctivae normal, no scleral icterus ENMT: mmm Neck: normal, supple Respiratory: clear to auscultation bilaterally, no wheezing, no crackles. Normal respiratory effort.  Cardiovascular: Regular rate and rhythm, no murmurs / rubs / gallops. No LE edema. Abdomen: soft, no distention, no tenderness. Bowel sounds positive.   Data Reviewed: I have independently reviewed following labs and imaging studies   CBC Recent Labs  Lab 06/20/23 1316 06/21/23 0504 06/22/23 0521 06/23/23 0432 06/24/23 0612  WBC 8.9 6.6 7.7  6.1 11.5*  HGB 12.9* 10.0* 9.9* 9.0* 9.9*  HCT 42.9 32.4* 32.8* 30.3* 33.1*  PLT 234 202 207 197 266  MCV 86.8 87.1 87.7 90.2 89.0  MCH 26.1 26.9 26.5 26.8 26.6  MCHC 30.1 30.9 30.2 29.7* 29.9*  RDW 15.3 15.4 15.0 15.0 14.7  LYMPHSABS 0.7  --   --   --   --   MONOABS 0.8  --   --   --   --   EOSABS 0.0  --   --   --   --   BASOSABS 0.1  --   --   --   --     Recent Labs  Lab 06/20/23 1316 06/20/23 1835 06/20/23 2138 06/21/23 0114 06/21/23 0504 06/22/23 0521 06/23/23 0432 06/23/23 0641 06/24/23 0612  NA 141  --   --   --  137 136 136  --  135  K 4.0  --   --   --  4.0 3.7 5.8* 4.7 4.4  CL 101  --   --   --  101 100 106  --  99  CO2 32  --   --   --  27 28 22   --  29  GLUCOSE 181*  --   --   --  112* 167* 260*  --  240*  BUN 20  --   --   --  22 25* 24*  --  25*  CREATININE 1.18  --   --   --  1.08 0.97 0.87  --  0.89  CALCIUM 9.0  --   --   --  8.4* 8.3* 7.4*  --  8.3*  AST  --   --   --   --   --  13*  --   --  14*  ALT  --   --   --   --    --  13  --   --  16  ALKPHOS  --   --   --   --   --  52  --   --  61  BILITOT  --   --   --   --   --  0.6  --   --  0.4  ALBUMIN  --   --   --   --   --  2.1*  --   --  2.4*  MG  --   --   --   --   --  1.6*  --   --  1.8  LATICACIDVEN  --  >9.0* 1.1 0.9  --   --   --   --   --     ------------------------------------------------------------------------------------------------------------------ No results for input(s): "CHOL", "HDL", "LDLCALC", "TRIG", "CHOLHDL", "LDLDIRECT" in the last 72 hours.  Lab Results  Component Value Date   HGBA1C 8.0 (H) 04/12/2023   ------------------------------------------------------------------------------------------------------------------ No results for input(s): "TSH", "T4TOTAL", "T3FREE", "THYROIDAB" in the last 72 hours.  Invalid input(s): "FREET3"  Cardiac Enzymes No results for input(s): "CKMB", "TROPONINI", "MYOGLOBIN" in the last 168 hours.  Invalid input(s): "CK" ------------------------------------------------------------------------------------------------------------------ No results found for: "BNP"  CBG: Recent Labs  Lab 06/24/23 1136 06/24/23 1723 06/24/23 2053 06/25/23 0731 06/25/23 1134  GLUCAP 257* 252* 349* 172* 353*    Recent Results (from the past 240 hours)  Urine Culture     Status: Abnormal   Collection Time: 06/20/23 12:14 PM   Specimen: Urine, Random  Result Value Ref Range Status   Specimen  Description   Final    URINE, RANDOM Performed at [facility], [address], [place], [place] [address]    Special Requests   Final    NONE Reflexed from [phone] Performed at [facility], [address], [place], [place] [address]    Culture (A)  Final    70,000 COLONIES/mL YEAST Performed at [facility], [address] [place], [place] [address]    Report Status 06/21/2023 FINAL  Final  Culture, blood (Routine X 2) w Reflex to ID Panel     Status: None    Collection Time: 06/20/23  8:05 PM   Specimen: BLOOD  Result Value Ref Range Status   Specimen Description   Final    BLOOD BLOOD LEFT HAND Performed at [facility], [address] [place], [place] [address]    Special Requests   Final    BOTTLES DRAWN AEROBIC AND ANAEROBIC Blood Culture results may not be optimal due to an inadequate volume of blood received in culture bottles Performed at [facility], [address], [place], [place] [address]    Culture   Final    NO GROWTH 5 DAYS Performed at [facility], [address] [place], [place] [address]    Report Status 06/25/2023 FINAL  Final  Culture, blood (Routine X 2) w Reflex to ID Panel     Status: None   Collection Time: 06/20/23  8:05 PM   Specimen: BLOOD  Result Value Ref Range Status   Specimen Description   Final    BLOOD RIGHT ANTECUBITAL Performed at [facility], [address] [place], [place] [address]    Special Requests   Final    BOTTLES DRAWN AEROBIC AND ANAEROBIC Blood Culture results may not be optimal due to an inadequate volume of blood received in culture bottles Performed at [facility], [address], [place], [place] [address]    Culture   Final    NO GROWTH 5 DAYS Performed at [facility], [address], [place], [place] [address]    Report Status 06/25/2023 FINAL  Final  Surgical PCR screen     Status: None   Collection Time: 06/21/23  3:24 PM   Specimen: Nasal Mucosa; Nasal Swab  Result Value Ref Range Status   MRSA, PCR NEGATIVE NEGATIVE Final   Staphylococcus aureus NEGATIVE NEGATIVE Final    Comment: (NOTE) The Xpert SA Assay (FDA approved for NASAL specimens in patients 48 years of age and older), is one component of a comprehensive surveillance program. It is not intended to diagnose infection nor to guide or monitor treatment. Performed at [facility], [address] [place], [place] [address]      Radiology Studies: DG CHEST PORT 1 VIEW Result Date: 06/25/2023 CLINICAL DATA:  59563 Hypoxemia [id number] EXAM: PORTABLE CHEST 1 VIEW COMPARISON:  June 20, 2023 FINDINGS: The cardiomediastinal silhouette is unchanged in contour. No pleural effusion. No pneumothorax. New band like opacity at the LEFT lung base and RIGHT cardiac border. Status post ORIF of the LEFT humerus. IMPRESSION: New band like opacity at the LEFT lung base and RIGHT cardiac border. Findings are favored to reflect atelectasis. Electronically Signed   By: Meda Klinefelter M.D.   On: 06/25/2023 14:14       Pamella Pert, MD, PhD [facility]  Between 7 am - 7 pm I am available, please contact me  via Amion (for emergencies) or Securechat (non urgent messages)  Between 7 pm - 7 am I am not available, please contact night coverage MD/APP via Amion

## 2023-06-25 NOTE — Progress Notes (Signed)
   06/25/23 1340  Assess: MEWS Score  Temp 98.9 F (37.2 C)  BP (!) 147/81  MAP (mmHg) 96  Pulse Rate (!) 128  Resp (!) 22  SpO2 91 %  O2 Device Nasal Cannula  O2 Flow Rate (L/min) 6 L/min  Assess: MEWS Score  MEWS Temp 0  MEWS Systolic 0  MEWS Pulse 2  MEWS RR 1  MEWS LOC 0  MEWS Score 3  MEWS Score Color Yellow  Assess: if the MEWS score is Yellow or Red  Were vital signs accurate and taken at a resting state? Yes  Does the patient meet 2 or more of the SIRS criteria? Yes  Does the patient have a confirmed or suspected source of infection? Yes  MEWS guidelines implemented  Yes, yellow  Treat  MEWS Interventions Considered administering scheduled or prn medications/treatments as ordered  Take Vital Signs  Increase Vital Sign Frequency  Yellow: Q2hr x1, continue Q4hrs until patient remains green for 12hrs  Escalate  MEWS: Escalate Yellow: Discuss with charge nurse and consider notifying provider and/or RRT  Notify: Charge Nurse/RN  Name of Charge Nurse/RN Notified Annabelle Harman, RN  Provider Notification  Provider Name/Title Dr. Elvera Lennox  Date Provider Notified 06/25/23  Time Provider Notified 1350  Method of Notification Page  Notification Reason Change in status  Provider response At bedside;See new orders  Date of Provider Response 06/25/23  Time of Provider Response 1355  Assess: SIRS CRITERIA  SIRS Temperature  0  SIRS Respirations  1  SIRS Pulse 1  SIRS WBC 0  SIRS Score Sum  2

## 2023-06-25 NOTE — Progress Notes (Signed)
 Occupational Therapy Treatment Patient Details Name: Dustin Duffy MRN: [id number] DOB: 12/07/1955 Today's Date: 06/25/2023   History of present illness Patient is a 68 year old male who presented after a fall at home resulting in L shoulder pain. Patient was admitted with UTI, proximal L humerus fracture, sinus tachycardia. Patient is s/p L UE ORIF on 06/22/23.   PMH: urology 04/28/22 with UTI, DM II, HLD, BPH,T12 wedge compression fracture, legally blind,   OT comments  Patient and nursing staff were educated on proper sling positioning and pillow support for shoulder. Patient continues to have poor insight to deficits during session. Patient was also educated on bathing/dressing while adhering to No ROM restrictions for shoulder. Patient and NT verbalized understanding. Patient is not at a ADL level to be able to transition home with current shoulder restrictions independently. Patient will benefit from continued inpatient follow up therapy, <3 hours/day.       If plan is discharge home, recommend the following:  Two people to help with walking and/or transfers;A lot of help with bathing/dressing/bathroom;Assistance with cooking/housework;Direct supervision/assist for medications management;Assist for transportation;Help with stairs or ramp for entrance;Assistance with feeding;Direct supervision/assist for financial management   Equipment Recommendations  None recommended by OT       Precautions / Restrictions Precautions Precautions: Shoulder;Fall Shoulder Interventions: Shoulder sling/immobilizer;Off for dressing/bathing/exercises;At all times Precaution/Restrictions Comments: Sling at all times except ADL/exercise, ok for  AROM elbow, wrist and hand to tolerance, no AROM/PROM of shoulder. Required Braces or Orthoses: Sling Restrictions Weight Bearing Restrictions Per Provider Order: Yes LUE Weight Bearing Per Provider Order: Non weight bearing              ADL either performed or  assessed with clinical judgement   ADL Overall ADL's : Needs assistance/impaired                 Upper Body Dressing : Total assistance;Sitting Upper Body Dressing Details (indicate cue type and reason): educated nurse and NT and patient on proper sling positioning, use of thumb strap and pillow positioning for UE                                  Communication Communication Communication: No apparent difficulties Factors Affecting Communication: Hearing impaired   Cognition Arousal: Alert Behavior During Therapy: WFL for tasks assessed/performed Cognition: Cognition impaired     Awareness: Intellectual awareness impaired, Online awareness impaired Memory impairment (select all impairments): Short-term memory, Working memory Attention impairment (select first level of impairment): Selective attention Executive functioning impairment (select all impairments): Problem solving, Reasoning OT - Cognition Comments: patient has decreased insight to deficits at baseline.                 Following commands: Intact              Shoulder Instructions Shoulder Instructions Donning/doffing shirt without moving shoulder: Caregiver independent with task;Maximal assistance (education provided to nurse and NT) Method for sponge bathing under operated UE: Caregiver independent with task (education provided today to nurse and NT) Correct positioning of sling/immobilizer: Maximal assistance;Caregiver independent with task (education provided today to nurse and NT) ROM for elbow, wrist and digits of operated UE: Supervision/safety (10 reps each) Sling wearing schedule (on at all times/off for ADL's): Caregiver independent with task (nurse and NT) Positioning of UE while sleeping: Caregiver independent with task (education provided on pillow placement in chair and bed today)  General Comments      Pertinent Vitals/ Pain       Pain Assessment Pain Assessment:  Faces Faces Pain Scale: Hurts a little bit Pain Location: L shoulder Pain Descriptors / Indicators: Guarding Pain Intervention(s): Limited activity within patient's tolerance, Monitored during session, Repositioned     Prior Functioning/Environment              Frequency  Min 2X/week        Progress Toward Goals  OT Goals(current goals can now be found in the care plan section)  Progress towards OT goals: Progressing toward goals     Plan      Co-evaluation                 AM-PAC OT "6 Clicks" Daily Activity     Outcome Measure   Help from another person eating meals?: A Lot Help from another person taking care of personal grooming?: A Lot Help from another person toileting, which includes using toliet, bedpan, or urinal?: A Lot Help from another person bathing (including washing, rinsing, drying)?: A Lot Help from another person to put on and taking off regular upper body clothing?: A Lot Help from another person to put on and taking off regular lower body clothing?: Total 6 Click Score: 11    End of Session Equipment Utilized During Treatment: Other (comment) (sling)  OT Visit Diagnosis: Unsteadiness on feet (R26.81);Other abnormalities of gait and mobility (R26.89);Pain;Muscle weakness (generalized) (M62.81);History of falling (Z91.81);Repeated falls (R29.6);Low vision, both eyes (H54.2) Pain - Right/Left: Left Pain - part of body: Shoulder   Activity Tolerance Patient tolerated treatment well   Patient Left with call bell/phone within reach;in chair;with chair alarm set;Other (comment) (NT in room)   Nurse Communication Mobility status;Other (comment) (education on care for ORIF with no shoulder movement)        Time: 1610-9604 OT Time Calculation (min): 17 min  Charges: OT General Charges $OT Visit: 1 Visit OT Treatments $Self Care/Home Management : 8-22 mins  Rosalio Loud, MS Acute Rehabilitation Department Office#  [phone]   Selinda Flavin 06/25/2023, 10:39 AM

## 2023-06-25 NOTE — Progress Notes (Signed)
 Mobility Specialist - Progress Note   06/25/23 0833  Mobility  Activity Transferred from bed to chair  Level of Assistance Minimal assist, patient does 75% or more  Assistive Device Front wheel walker  Range of Motion/Exercises Active  LUE Weight Bearing Per Provider Order NWB  Activity Response Tolerated well  Mobility Referral Yes  Mobility visit 1 Mobility  Mobility Specialist Start Time (ACUTE ONLY) 0820  Mobility Specialist Stop Time (ACUTE ONLY) [id number]  Mobility Specialist Time Calculation (min) (ACUTE ONLY) 13 min   Pt was found in bed and agreeable to mobilize. Was left on recliner chair with all needs met. Call bell in reach and NT notified. Chair alarm on.  Dustin Duffy Mobility Specialist

## 2023-06-25 NOTE — Progress Notes (Signed)
 Patient is  currently refusing CT scan and prefers to delay until morning.Patient educated on the importance of completing CT scan now, but still continues to decline.On call provider and charge nurse notified.

## 2023-06-26 ENCOUNTER — Encounter: Payer: Self-pay | Admitting: Licensed Clinical Social Worker

## 2023-06-26 ENCOUNTER — Inpatient Hospital Stay (HOSPITAL_COMMUNITY)

## 2023-06-26 DIAGNOSIS — N390 Urinary tract infection, site not specified: Secondary | ICD-10-CM | POA: Diagnosis not present

## 2023-06-26 DIAGNOSIS — A419 Sepsis, unspecified organism: Secondary | ICD-10-CM | POA: Diagnosis not present

## 2023-06-26 LAB — COMPREHENSIVE METABOLIC PANEL WITH GFR
ALT: 21 U/L (ref 0–44)
AST: 22 U/L (ref 15–41)
Albumin: 2.4 g/dL — ABNORMAL LOW (ref 3.5–5.0)
Alkaline Phosphatase: 60 U/L (ref 38–126)
Anion gap: 8 (ref 5–15)
BUN: 21 mg/dL (ref 8–23)
CO2: 33 mmol/L — ABNORMAL HIGH (ref 22–32)
Calcium: 8.1 mg/dL — ABNORMAL LOW (ref 8.9–10.3)
Chloride: 94 mmol/L — ABNORMAL LOW (ref 98–111)
Creatinine, Ser: 0.65 mg/dL (ref 0.61–1.24)
GFR, Estimated: >60 mL/min (ref >60)
Glucose, Bld: 218 mg/dL — ABNORMAL HIGH (ref 70–99)
Potassium: 3 mmol/L — ABNORMAL LOW (ref 3.5–5.1)
Sodium: 135 mmol/L (ref 135–145)
Total Bilirubin: 0.6 mg/dL (ref 0.0–1.2)
Total Protein: 5.4 g/dL — ABNORMAL LOW (ref 6.5–8.1)

## 2023-06-26 LAB — GLUCOSE, CAPILLARY
Glucose-Capillary: 237 mg/dL — ABNORMAL HIGH (ref 70–99)
Glucose-Capillary: 321 mg/dL — ABNORMAL HIGH (ref 70–99)
Glucose-Capillary: 404 mg/dL — ABNORMAL HIGH (ref 70–99)
Glucose-Capillary: 311 mg/dL — ABNORMAL HIGH (ref 70–99)
Glucose-Capillary: 146 mg/dL — ABNORMAL HIGH (ref 70–99)

## 2023-06-26 LAB — CBC
HCT: 33.2 % — ABNORMAL LOW (ref 39.0–52.0)
Hemoglobin: 10.2 g/dL — ABNORMAL LOW (ref 13.0–17.0)
MCH: 26.5 pg (ref 26.0–34.0)
MCHC: 30.7 g/dL (ref 30.0–36.0)
MCV: 86.2 fL (ref 80.0–100.0)
Platelets: 304 10*3/uL (ref 150–400)
RBC: 3.85 MIL/uL — ABNORMAL LOW (ref 4.22–5.81)
RDW: 14.7 % (ref 11.5–15.5)
WBC: 9.1 10*3/uL (ref 4.0–10.5)
nRBC: 0 % (ref 0.0–0.2)

## 2023-06-26 LAB — PHOSPHORUS: Phosphorus: 2 mg/dL — ABNORMAL LOW (ref 2.5–4.6)

## 2023-06-26 LAB — MAGNESIUM: Magnesium: 1.4 mg/dL — ABNORMAL LOW (ref 1.7–2.4)

## 2023-06-26 MED ORDER — POTASSIUM CHLORIDE CRYS ER 20 MEQ PO TBCR
40.0000 meq | EXTENDED_RELEASE_TABLET | Freq: Once | ORAL | Status: AC
  Administered 2023-06-26: 40 meq via ORAL
  Filled 2023-06-26: qty 2

## 2023-06-26 MED ORDER — INSULIN ASPART 100 UNIT/ML IJ SOLN
6.0000 [IU] | Freq: Once | INTRAMUSCULAR | Status: AC
  Administered 2023-06-26: 6 [IU] via SUBCUTANEOUS

## 2023-06-26 MED ORDER — MAGNESIUM SULFATE 2 GM/50ML IV SOLN
2.0000 g | Freq: Once | INTRAVENOUS | Status: AC
  Administered 2023-06-26: 2 g via INTRAVENOUS
  Filled 2023-06-26: qty 50

## 2023-06-26 NOTE — Progress Notes (Signed)
 Mobility Specialist - Progress Note  ([place] 2L) Pre-mobility: 111 bpm HR, 96% SpO2 During mobility: 118 bpm HR, 96% SpO2 Post-mobility: 108 bpm HR, 94% SPO2   06/26/23 1358  Mobility  Activity Ambulated with assistance in hallway  Level of Assistance Minimal assist, patient does 75% or more  Assistive Device Cane (4pt cane)  Distance Ambulated (ft) 60 ft  Range of Motion/Exercises Active  LUE Weight Bearing Per Provider Order NWB  Activity Response Tolerated well  Mobility Referral Yes  Mobility visit 1 Mobility  Mobility Specialist Start Time (ACUTE ONLY) 1338  Mobility Specialist Stop Time (ACUTE ONLY) 1358  Mobility Specialist Time Calculation (min) (ACUTE ONLY) 20 min   Pt was found on recliner chair and agreeable to ambulate. No complaints with session. At EOS returned to bed with all needs met. Call bell in reach.  Billey Chang Mobility Specialist

## 2023-06-26 NOTE — TOC Progression Note (Signed)
 Transition of Care [facility]) - Progression Note    Patient Details  Name: Dustin Duffy MRN: [id number] Date of Birth: [birth year]  Transition of [facility]) CM/SW Contact  Saketh Daubert, Olegario Messier, RN Phone Number: 06/26/2023, 2:57 PM  Clinical Narrative: Patient chose [facility] rep Darrian aware of d/c tomorrow.      Expected Discharge Plan: Skilled Nursing Facility Barriers to Discharge: Continued Medical Work up  Expected Discharge Plan and Services   Discharge Planning Services: CM Consult Post Acute Care Choice: Home Health Living arrangements for the past 2 months: Single Family Home                           HH Arranged: PT, OT HH Agency: [facility] Date [facility] Agency Contacted: 06/21/23 Time HH Agency Contacted: 1431 Representative spoke with at [facility] Agency: Haywood Lasso   Social Determinants of Health (SDOH) Interventions SDOH Screenings   Food Insecurity: No Food Insecurity (06/21/2023)  Housing: Low Risk  (06/21/2023)  Transportation Needs: Unmet Transportation Needs (06/21/2023)  Utilities: Not At Risk (06/21/2023)  Depression (PHQ2-9): Low Risk  (04/02/2021)  Social Connections: Socially Isolated (06/21/2023)  Tobacco Use: Medium Risk (06/22/2023)    Readmission Risk Interventions    05/02/2022   10:09 AM  Readmission Risk Prevention Plan  Transportation Screening Complete  PCP or Specialist Appt within 5-7 Days Complete  Home Care Screening Complete  Medication Review (RN CM) Complete

## 2023-06-26 NOTE — Progress Notes (Addendum)
 PROGRESS NOTE  Dustin Duffy JXB:[id number] DOB: 1955-08-31 DOA: 06/20/2023 PCP: Irena Reichmann, DO   LOS: 6 days   Brief Narrative / Interim history: 68 year old male with DM2, BPH with urinary obstruction with chronic Foley catheter, CKD 2, HTN, HLD comes into the hospital after a fall.  He was walking from his kitchen to the living room when all of a sudden his legs gave out and fell on the left side.  He had left shoulder pain afterwards.  He presented to the ER, was found to have a UTI and x-ray of the shoulder showed acute displaced fracture of the proximal left humerus.  Orthopedic surgery was consulted and he was admitted to the hospital.  Patient course complicated by development and worsening of his hypoxia requiring 6 L, as well as worsening of sinus tachycardia 3/30  Subjective / 24h Interval events: No chest pain, no shortness of breath.  He did not want to have the CT scan last night  Assesement and Plan: Principal problem Sepsis due to UTI -met sepsis criteria with tachycardia, tachypnea, elevated lactic acid, although latter could have been in the setting of recent fall.  Urine culture sent and growing 70^5 yeast, patient was started on ceftriaxone as well as fluconazole, continue for now perioperatively.  Plan for total of 5 days.  Remains afebrile  Active problems Proximal L humerus fracture -orthopedic surgery consulted, status post-ORIF 3/27.  PT/OT recommends SNF, he is stable for discharge  Acute hypoxic respiratory failure -required oxygen postoperatively, has slightly increased oxygen needs today up to 6 L now, chest x-ray with some atelectasis but no clear-cut pneumonia.  D-dimer was positive, obtain CT angiogram, refused study last night.  Agreeable to have it done this morning.  Will follow-up  Frequent falls -admitted in January for fall suspected to be 2/2 UTI.  PT/OT consulted, SNF pending.  Sinus tachycardia - Heart rates ranging in the 100s to 130s since arrival  to the ED.  EKG showing sinus tachycardia.  Suspect secondary to underlying infection and pain from humerus fracture.  More tachycardic today, rule out PE as above   Hypertension, history of hypotension - Patient with history of hypotension thought to be 2/2 autonomic dysfunction stemming from T2DM. He is on midodrine 5 mg 3 times daily at home along with fludrocortisone 0.1mg  daily.  Blood pressure has remained on the high side, hold midodrine   T2DM - On xigduo 10-500mg  daily at home. Question whether SGLT-2i component is leading to recurrent UTIs. Will hold home xigduo. A1c 8.0% in 04/12/2023.  Continue sliding scale   HLD -resume home zocor 20mg  qhs   BPH with urinary obstruction s/p chronic indwelling Foley catheter -follows Dr. Alvester Morin, [facility]. Foley catheter switched on arrival to ED. Continue home mirabegron 50mg  daily  Hypokalemia, hypomagnesemia-continue to monitor his replace as indicated, redosed today  CKD Stage 2 -continue overall stable  Anemia of chronic disease-hemoglobin stable  Scheduled Meds:  amitriptyline  50 mg Oral QHS   Chlorhexidine Gluconate Cloth  6 each Topical Daily   docusate sodium  100 mg Oral BID   fluconazole  200 mg Oral QHS   fludrocortisone  0.1 mg Oral Daily   gabapentin  900 mg Oral QHS   insulin aspart  0-5 Units Subcutaneous QHS   insulin aspart  0-9 Units Subcutaneous TID WC   midodrine  5 mg Oral TID WC   mirabegron ER  50 mg Oral Daily   mupirocin ointment  1 Application Nasal BID  simvastatin  20 mg Oral QHS   Continuous Infusions:  cefTRIAXone (ROCEPHIN)  IV 1 g (06/25/23 1423)   PRN Meds:.acetaminophen, alum & mag hydroxide-simeth, bisacodyl, diphenhydrAMINE, HYDROmorphone (DILAUDID) injection, iohexol, lip balm, magnesium citrate, menthol-cetylpyridinium **OR** phenol, methocarbamol **OR** methocarbamol (ROBAXIN) injection, metoCLOPramide **OR** metoCLOPramide (REGLAN) injection, ondansetron **OR** ondansetron (ZOFRAN) IV,  oxyCODONE, oxyCODONE, senna-docusate  Current Outpatient Medications  Medication Instructions   acetaminophen (TYLENOL) 650 mg, Oral, Every 6 hours PRN   amitriptyline (ELAVIL) 50 mg, Oral, Daily at bedtime   aspirin EC 81 mg, Oral, Every morning, Swallow whole.   cefpodoxime (VANTIN) 200 mg, Oral, 2 times daily   cholecalciferol (VITAMIN D3) 1,000 Units, Oral, Every M-W-F   fluconazole (DIFLUCAN) 200 mg, Oral, Daily at bedtime   fludrocortisone (FLORINEF) 0.1 mg, Oral, Daily   gabapentin (NEURONTIN) 900 mg, Oral, Daily at bedtime   ipratropium (ATROVENT) 0.03 % nasal spray 2 sprays, Each Nare, 2 times daily PRN   Januvia 50 mg, Every morning   midodrine (PROAMATINE) 5 mg, Oral, 3 times daily with meals   Myrbetriq 50 mg, Oral, Every morning   oxyCODONE (ROXICODONE) 5 mg, Oral, Every 4 hours PRN   simvastatin (ZOCOR) 20 mg, Oral, Every evening, Do not take this medication until you finish Diflucan.   Water For Irrigation, Sterile (STERILE WATER) Irrigation, See admin instructions, Mix sterile water with white vinegar 4:1 and flush indwelling catheter nightly before bedtime    XIGDUO XR 10-500 MG TB24 1 tablet, Daily before breakfast    Diet Orders (From admission, onward)     Start     Ordered   06/23/23 0621  Diet Carb Modified Fluid consistency: Thin; Room service appropriate? Yes  Diet effective now       Question Answer Comment  Diet-HS Snack? Nothing   Calorie Level Medium 1600-2000   Fluid consistency: Thin   Room service appropriate? Yes      06/23/23 0620            DVT prophylaxis: SCD's Start: 06/22/23 1829 Place and maintain sequential compression device Start: 06/20/23 2044   Lab Results  Component Value Date   PLT 304 06/26/2023      Code Status: Full Code  Family Communication: No family at bedside  Status is: Inpatient Remains inpatient appropriate because: severity of illness  Level of care: Telemetry  Consultants:  Orthopedic surgery    Objective: Vitals:   06/25/23 2023 06/25/23 2100 06/25/23 2357 06/26/23 0517  BP:  (!) 170/86 (!) 155/92 (!) 155/95  Pulse: (!) 115 (!) 105 90 88  Resp:   14 15  Temp: 98.5 F (36.9 C)  98 F (36.7 C) (!) 97.5 F (36.4 C)  TempSrc: Oral  Oral Oral  SpO2: 93%  98% 98%  Weight:      Height:        Intake/Output Summary (Last 24 hours) at 06/26/2023 8295 Last data filed at 06/26/2023 0830 Gross per 24 hour  Intake 960 ml  Output 4875 ml  Net -3915 ml   Wt Readings from Last 3 Encounters:  06/22/23 53.7 kg  04/12/23 63 kg  06/06/22 63.5 kg    Examination:  Constitutional: NAD Eyes: lids and conjunctivae normal, no scleral icterus ENMT: mmm Neck: normal, supple Respiratory: clear to auscultation bilaterally, no wheezing, no crackles.  Cardiovascular: Regular rate and rhythm, no murmurs / rubs / gallops. No LE edema. Abdomen: soft, no distention, no tenderness. Bowel sounds positive.   Data Reviewed: I have independently reviewed following  labs and imaging studies   CBC Recent Labs  Lab 06/20/23 1316 06/21/23 0504 06/22/23 0521 06/23/23 0432 06/24/23 0612 06/26/23 0456  WBC 8.9 6.6 7.7 6.1 11.5* 9.1  HGB 12.9* 10.0* 9.9* 9.0* 9.9* 10.2*  HCT 42.9 32.4* 32.8* 30.3* 33.1* 33.2*  PLT 234 202 207 197 266 304  MCV 86.8 87.1 87.7 90.2 89.0 86.2  MCH 26.1 26.9 26.5 26.8 26.6 26.5  MCHC 30.1 30.9 30.2 29.7* 29.9* 30.7  RDW 15.3 15.4 15.0 15.0 14.7 14.7  LYMPHSABS 0.7  --   --   --   --   --   MONOABS 0.8  --   --   --   --   --   EOSABS 0.0  --   --   --   --   --   BASOSABS 0.1  --   --   --   --   --     Recent Labs  Lab 06/20/23 1835 06/20/23 2138 06/21/23 0114 06/21/23 0504 06/22/23 0521 06/23/23 0432 06/23/23 0641 06/24/23 0612 06/25/23 1440 06/26/23 0456  NA  --   --   --    < > 136 136  --  135 137 135  K  --   --   --    < > 3.7 5.8* 4.7 4.4 4.1 3.0*  CL  --   --   --    < > 100 106  --  99 97* 94*  CO2  --   --   --    < > 28 22  --  29 33*  33*  GLUCOSE  --   --   --    < > 167* 260*  --  240* 334* 218*  BUN  --   --   --    < > 25* 24*  --  25* 26* 21  CREATININE  --   --   --    < > 0.97 0.87  --  0.89 0.98 0.65  CALCIUM  --   --   --    < > 8.3* 7.4*  --  8.3* 8.4* 8.1*  AST  --   --   --   --  13*  --   --  14*  --  22  ALT  --   --   --   --  13  --   --  16  --  21  ALKPHOS  --   --   --   --  52  --   --  61  --  60  BILITOT  --   --   --   --  0.6  --   --  0.4  --  0.6  ALBUMIN  --   --   --   --  2.1*  --   --  2.4*  --  2.4*  MG  --   --   --   --  1.6*  --   --  1.8  --  1.4*  DDIMER  --   --   --   --   --   --   --   --  3.45*  --   LATICACIDVEN >9.0* 1.1 0.9  --   --   --   --   --   --   --    < > = values in this interval not displayed.    ------------------------------------------------------------------------------------------------------------------ No results for input(s): "  CHOL", "HDL", "LDLCALC", "TRIG", "CHOLHDL", "LDLDIRECT" in the last 72 hours.  Lab Results  Component Value Date   HGBA1C 8.0 (H) 04/12/2023   ------------------------------------------------------------------------------------------------------------------ No results for input(s): "TSH", "T4TOTAL", "T3FREE", "THYROIDAB" in the last 72 hours.  Invalid input(s): "FREET3"  Cardiac Enzymes No results for input(s): "CKMB", "TROPONINI", "MYOGLOBIN" in the last 168 hours.  Invalid input(s): "CK" ------------------------------------------------------------------------------------------------------------------ No results found for: "BNP"  CBG: Recent Labs  Lab 06/25/23 0731 06/25/23 1134 06/25/23 1614 06/25/23 2207 06/26/23 0734  GLUCAP 172* 353* 265* 320* 237*    Recent Results (from the past 240 hours)  Urine Culture     Status: Abnormal   Collection Time: 06/20/23 12:14 PM   Specimen: Urine, Random  Result Value Ref Range Status   Specimen Description   Final    URINE, RANDOM Performed at [facility], [address], [place], [place] [address]    Special Requests   Final    NONE Reflexed from [phone] Performed at [facility], [address], [place], [place] [address]    Culture (A)  Final    70,000 COLONIES/mL YEAST Performed at [facility], [address] [place], [place] [address]    Report Status 06/21/2023 FINAL  Final  Culture, blood (Routine X 2) w Reflex to ID Panel     Status: None   Collection Time: 06/20/23  8:05 PM   Specimen: BLOOD  Result Value Ref Range Status   Specimen Description   Final    BLOOD BLOOD LEFT HAND Performed at [facility], [address], [place], [place] [address]    Special Requests   Final    BOTTLES DRAWN AEROBIC AND ANAEROBIC Blood Culture results may not be optimal due to an inadequate volume of blood received in culture bottles Performed at [facility], [address] [place], [place] [address]    Culture   Final    NO GROWTH 5 DAYS Performed at [facility], [address] [place], [place] [address]    Report Status 06/25/2023 FINAL  Final  Culture, blood (Routine X 2) w Reflex to ID Panel     Status: None   Collection Time: 06/20/23  8:05 PM   Specimen: BLOOD  Result Value Ref Range Status   Specimen Description   Final    BLOOD RIGHT ANTECUBITAL Performed at [facility], [address] [place], [place] [address]    Special Requests   Final    BOTTLES DRAWN AEROBIC AND ANAEROBIC Blood Culture results may not be optimal due to an inadequate volume of blood received in culture bottles Performed at [facility], [address], [place], [place] [address]    Culture   Final    NO GROWTH 5 DAYS Performed at [facility], [address] [place], [place] [address]    Report Status 06/25/2023 FINAL  Final  Surgical PCR screen     Status: None   Collection Time: 06/21/23  3:24 PM   Specimen: Nasal  Mucosa; Nasal Swab  Result Value Ref Range Status   MRSA, PCR NEGATIVE NEGATIVE Final   Staphylococcus aureus NEGATIVE NEGATIVE Final    Comment: (NOTE) The Xpert SA Assay (FDA approved for NASAL specimens in patients 77 years of age and older), is one component of a comprehensive surveillance program. It is not intended to diagnose infection nor to guide or monitor treatment. Performed at [facility], [address], [place], [place] [address]      Radiology Studies: DG CHEST PORT 1 VIEW Result Date: 06/25/2023 CLINICAL DATA:  60454 Hypoxemia [id number] EXAM: PORTABLE CHEST 1 VIEW COMPARISON:  June 20, 2023 FINDINGS: The cardiomediastinal silhouette is unchanged in contour. No pleural effusion. No pneumothorax. New band like opacity at the LEFT lung base and RIGHT cardiac border. Status post ORIF of the LEFT humerus. IMPRESSION: New band like opacity at the LEFT lung base and RIGHT cardiac border. Findings are favored to reflect atelectasis. Electronically Signed   By: Meda Klinefelter M.D.   On: 06/25/2023 14:14       Pamella Pert, MD, PhD [facility]  Between 7 am - 7 pm I am available, please contact me via Amion (for emergencies) or Securechat (non urgent messages)  Between 7 pm - 7 am I am not available, please contact night coverage MD/APP via Amion

## 2023-06-26 NOTE — Inpatient Diabetes Management (Signed)
 Inpatient Diabetes Program Recommendations  AACE/ADA: New Consensus Statement on Inpatient Glycemic Control (2015)  Target Ranges:  Prepandial:   less than 140 mg/dL      Peak postprandial:   less than 180 mg/dL (1-2 hours)      Critically ill patients:  140 - 180 mg/dL   Lab Results  Component Value Date   GLUCAP 321 (H) 06/26/2023   HGBA1C 8.0 (H) 04/12/2023    Review of Glycemic Control  Latest Reference Range & Units 06/25/23 07:31 06/25/23 11:34 06/25/23 16:14 06/25/23 22:07 06/26/23 07:34 06/26/23 11:21  Glucose-Capillary 70 - 99 mg/dL 161 (H) 096 (H) 045 (H) 320 (H) 237 (H) 321 (H)  (H): Data is abnormally high  Diabetes history: DM2 Outpatient Diabetes medications: Januvia 50 mg QD Current orders for Inpatient glycemic control: Novolog 0-9 units TID and 0-5 units at bedtime, Florinef 0.1 mg QD  Inpatient Diabetes Program Recommendations:     Might consider:  Novolog 3 units TID with meals if he consumes at least 50%.  Will continue to follow while inpatient.  Thank you, Dulce Sellar, MSN, CDCES Diabetes Coordinator Inpatient Diabetes Program [phone] (team pager from 8a-5p)

## 2023-06-26 NOTE — Progress Notes (Signed)
   06/26/23 1335  Assess: MEWS Score  Temp 98.3 F (36.8 C)  BP 131/86  MAP (mmHg) 99  Pulse Rate (!) 113  Resp (!) 24  SpO2 97 %  O2 Device Nasal Cannula  Assess: MEWS Score  MEWS Temp 0  MEWS Systolic 0  MEWS Pulse 2  MEWS RR 1  MEWS LOC 0  MEWS Score 3  MEWS Score Color Yellow  Assess: if the MEWS score is Yellow or Red  Were vital signs accurate and taken at a resting state? Yes  Does the patient meet 2 or more of the SIRS criteria? Yes  Does the patient have a confirmed or suspected source of infection? Yes  MEWS guidelines implemented  No, previously yellow, continue vital signs every 4 hours  Assess: SIRS CRITERIA  SIRS Temperature  0  SIRS Respirations  1  SIRS Pulse 1  SIRS WBC 0  SIRS Score Sum  2

## 2023-06-27 DIAGNOSIS — S42292D Other displaced fracture of upper end of left humerus, subsequent encounter for fracture with routine healing: Secondary | ICD-10-CM | POA: Diagnosis not present

## 2023-06-27 DIAGNOSIS — E43 Unspecified severe protein-calorie malnutrition: Secondary | ICD-10-CM | POA: Diagnosis not present

## 2023-06-27 DIAGNOSIS — D649 Anemia, unspecified: Secondary | ICD-10-CM | POA: Diagnosis not present

## 2023-06-27 DIAGNOSIS — S42292A Other displaced fracture of upper end of left humerus, initial encounter for closed fracture: Secondary | ICD-10-CM | POA: Diagnosis not present

## 2023-06-27 DIAGNOSIS — M6281 Muscle weakness (generalized): Secondary | ICD-10-CM | POA: Diagnosis not present

## 2023-06-27 DIAGNOSIS — N182 Chronic kidney disease, stage 2 (mild): Secondary | ICD-10-CM | POA: Diagnosis not present

## 2023-06-27 DIAGNOSIS — A419 Sepsis, unspecified organism: Secondary | ICD-10-CM | POA: Diagnosis not present

## 2023-06-27 DIAGNOSIS — R41841 Cognitive communication deficit: Secondary | ICD-10-CM | POA: Diagnosis not present

## 2023-06-27 DIAGNOSIS — R531 Weakness: Secondary | ICD-10-CM | POA: Diagnosis not present

## 2023-06-27 DIAGNOSIS — R2689 Other abnormalities of gait and mobility: Secondary | ICD-10-CM | POA: Diagnosis not present

## 2023-06-27 DIAGNOSIS — Z7401 Bed confinement status: Secondary | ICD-10-CM | POA: Diagnosis not present

## 2023-06-27 DIAGNOSIS — J99 Respiratory disorders in diseases classified elsewhere: Secondary | ICD-10-CM | POA: Diagnosis not present

## 2023-06-27 DIAGNOSIS — R296 Repeated falls: Secondary | ICD-10-CM | POA: Diagnosis not present

## 2023-06-27 DIAGNOSIS — R000 Tachycardia, unspecified: Secondary | ICD-10-CM | POA: Diagnosis not present

## 2023-06-27 DIAGNOSIS — S42302A Unspecified fracture of shaft of humerus, left arm, initial encounter for closed fracture: Secondary | ICD-10-CM | POA: Diagnosis not present

## 2023-06-27 DIAGNOSIS — D849 Immunodeficiency, unspecified: Secondary | ICD-10-CM | POA: Diagnosis not present

## 2023-06-27 DIAGNOSIS — T8484XD Pain due to internal orthopedic prosthetic devices, implants and grafts, subsequent encounter: Secondary | ICD-10-CM | POA: Diagnosis not present

## 2023-06-27 DIAGNOSIS — E119 Type 2 diabetes mellitus without complications: Secondary | ICD-10-CM | POA: Diagnosis not present

## 2023-06-27 DIAGNOSIS — J952 Acute pulmonary insufficiency following nonthoracic surgery: Secondary | ICD-10-CM | POA: Diagnosis not present

## 2023-06-27 DIAGNOSIS — N401 Enlarged prostate with lower urinary tract symptoms: Secondary | ICD-10-CM | POA: Diagnosis not present

## 2023-06-27 DIAGNOSIS — R278 Other lack of coordination: Secondary | ICD-10-CM | POA: Diagnosis not present

## 2023-06-27 DIAGNOSIS — N390 Urinary tract infection, site not specified: Secondary | ICD-10-CM | POA: Diagnosis not present

## 2023-06-27 LAB — BASIC METABOLIC PANEL WITH GFR
Anion gap: 7 (ref 5–15)
BUN: 21 mg/dL (ref 8–23)
CO2: 31 mmol/L (ref 22–32)
Calcium: 8 mg/dL — ABNORMAL LOW (ref 8.9–10.3)
Chloride: 94 mmol/L — ABNORMAL LOW (ref 98–111)
Creatinine, Ser: 0.56 mg/dL — ABNORMAL LOW (ref 0.61–1.24)
GFR, Estimated: >60 mL/min (ref >60)
Glucose, Bld: 233 mg/dL — ABNORMAL HIGH (ref 70–99)
Potassium: 3.5 mmol/L (ref 3.5–5.1)
Sodium: 132 mmol/L — ABNORMAL LOW (ref 135–145)

## 2023-06-27 LAB — MAGNESIUM: Magnesium: 1.6 mg/dL — ABNORMAL LOW (ref 1.7–2.4)

## 2023-06-27 LAB — GLUCOSE, CAPILLARY: Glucose-Capillary: 219 mg/dL — ABNORMAL HIGH (ref 70–99)

## 2023-06-27 NOTE — TOC Transition Note (Signed)
 Transition of Care [facility]) - Discharge Note   Patient Details  Name: Dustin Duffy MRN: [id number] Date of Birth: [birth year]  Transition [facility] CM/SW Contact:  Lanier Clam, RN Phone Number: 06/27/2023, 9:33 AM   Clinical Narrative: d/c today to [facility] rep Darrian accepted-going to rm#[id number] report #[phone]. PTAR called. No further CM needs.      Final next level of care: Skilled Nursing Facility Barriers to Discharge: No Barriers Identified   Patient Goals and CMS Choice Patient states their goals for this hospitalization and ongoing recovery are:: Rehab CMS Medicare.gov Compare Post Acute Care list provided to:: Patient Choice offered to / list presented to : Patient [place] ownership interest in [facility].provided to:: Patient    Discharge Placement PASRR number recieved: 06/26/23            Patient chooses bed at: [facility] Patient to be transferred to facility by: PTAR Name of family member notified: Patient Patient and family notified of of transfer: 06/27/23  Discharge Plan and Services Additional resources added to the After Visit Summary for     Discharge Planning Services: CM Consult Post Acute Care Choice: Skilled Nursing Facility                    HH Arranged: PT, OT [facility] Agency: [facility] Date [facility] Agency Contacted: 06/21/23 Time HH Agency Contacted: 1431 Representative spoke with at [facility] Agency: Haywood Lasso  Social Drivers of Health (SDOH) Interventions SDOH Screenings   Food Insecurity: No Food Insecurity (06/21/2023)  Housing: Low Risk  (06/21/2023)  Transportation Needs: Unmet Transportation Needs (06/21/2023)  Utilities: Not At Risk (06/21/2023)  Depression (PHQ2-9): Low Risk  (04/02/2021)  Social Connections: Socially Isolated (06/21/2023)  Tobacco Use: Medium Risk (06/22/2023)     Readmission Risk Interventions    05/02/2022   10:09 AM  Readmission Risk Prevention Plan  Transportation Screening  Complete  PCP or Specialist Appt within 5-7 Days Complete  Home Care Screening Complete  Medication Review (RN CM) Complete

## 2023-06-27 NOTE — Plan of Care (Signed)

## 2023-06-27 NOTE — Discharge Summary (Signed)
 Physician Discharge Summary  Dustin Duffy VFI:[id number] DOB: 1955/08/05 DOA: 06/20/2023  PCP: Irena Reichmann, DO  Admit date: 06/20/2023 Discharge date: 06/27/2023  Admitted From: home Disposition:  home  Recommendations for Outpatient Follow-up:  Follow up with PCP in 1-2 weeks  Home Health: none Equipment/Devices: none  Discharge Condition: stable CODE STATUS: Full code Diet Orders (From admission, onward)     Start     Ordered   06/23/23 0621  Diet Carb Modified Fluid consistency: Thin; Room service appropriate? Yes  Diet effective now       Question Answer Comment  Diet-HS Snack? Nothing   Calorie Level Medium 1600-2000   Fluid consistency: Thin   Room service appropriate? Yes      06/23/23 0620            HPI: Per admitting MD, Dustin Duffy is a 68 y.o. male with medical history significant of type 2 diabetes, hyperlipidemia, hypertension, frequent falls, BPH with urinary obstruction s/p chronic indwelling Foley catheter, CKD Stage 2 who presents to the ED after sustaining a fall. Was in his usual state of health until earlier today. He was walking from his kitchen to his living room when his legs suddenly gave out on him and he fell down.  He began having left shoulder pain immediately thereafter.  Denies any lightheadedness, dizziness, blurry vision, chest pain, palpitations, SHOB prior to fall. Denies any fevers, chills, abdominal pain. He does have a chronic foley catheter in place due to BPH with urinary obstruction. Follows Dr. Alvester Morin, [facility]. Was admitted in January 2025 for fall suspected to be 2/2 UTI.  Hospital Course / Discharge diagnoses: Sepsis due to UTI -met sepsis criteria with tachycardia, tachypnea, elevated lactic acid, although latter could have been in the setting of recent fall.  Urine culture sent and growing 70^5 yeast, patient completed a course with fluconazole and empiric ceftriaxone.   Active problems Proximal L humerus fracture  -orthopedic surgery consulted, status post-ORIF 3/27.  PT/OT recommends SNF, he is stable for discharge Acute hypoxic respiratory failure -required oxygen postoperatively, chest x-ray with some atelectasis but no clear-cut pneumonia.  D-dimer was positive, CT angiogram negative for PE. Improved Frequent falls -admitted in January for fall suspected to be 2/2 UTI.  PT/OT consulted, SNF pending. Sinus tachycardia - Heart rates ranging in the 100s to 130s since arrival to the ED.  EKG showing sinus tachycardia.  Suspect secondary to underlying infection and pain from humerus fracture. Improved Hypertension, history of hypotension - Patient with history of hypotension thought to be 2/2 autonomic dysfunction stemming from T2DM. He is on midodrine 5 mg 3 times daily at home along with fludrocortisone 0.1mg  daily.  Continue  T2DM - On xigduo 10-500mg  daily at home. Question whether SGLT-2i component is leading to recurrent UTI. Outpatient follow up   HLD -resume home zocor 20mg  qhs BPH with urinary obstruction s/p chronic indwelling Foley catheter -follows Dr. Alvester Morin, [facility]. Foley catheter switched on arrival to ED. Continue home mirabegron 50mg  daily Hypokalemia, hypomagnesemia-continue to monitor his replace as indicated CKD Stage 2 -continue overall stable Anemia of chronic disease-hemoglobin stable  Discharge Instructions   Allergies as of 06/27/2023   No Known Allergies      Medication List     TAKE these medications    acetaminophen 325 MG tablet Commonly known as: TYLENOL Take 2 tablets (650 mg total) by mouth every 6 (six) hours as needed for mild pain (pain score 1-3) (or Fever >/= 101).  amitriptyline 50 MG tablet Commonly known as: ELAVIL Take 50 mg by mouth at bedtime.   aspirin EC 81 MG tablet Take 81 mg by mouth in the morning. Swallow whole.   cholecalciferol 25 MCG (1000 UNIT) tablet Commonly known as: VITAMIN D3 Take 1,000 Units by mouth every Monday,  Wednesday, and Friday.   fludrocortisone 0.1 MG tablet Commonly known as: FLORINEF Take 1 tablet (0.1 mg total) by mouth daily.   gabapentin 300 MG capsule Commonly known as: NEURONTIN Take 900 mg by mouth at bedtime.   ipratropium 0.03 % nasal spray Commonly known as: ATROVENT Place 2 sprays into both nostrils 2 (two) times daily as needed for rhinitis.   Januvia 50 MG tablet Generic drug: sitaGLIPtin Take 50 mg by mouth in the morning.   midodrine 5 MG tablet Commonly known as: PROAMATINE Take 1 tablet (5 mg total) by mouth 3 (three) times daily with meals. What changed:  when to take this additional instructions   Myrbetriq 50 MG Tb24 tablet Generic drug: mirabegron ER Take 50 mg by mouth in the morning.   oxyCODONE 5 MG immediate release tablet Commonly known as: Roxicodone Take 1 tablet (5 mg total) by mouth every 4 (four) hours as needed for severe pain (pain score 7-10).   simvastatin 20 MG tablet Commonly known as: ZOCOR Take 1 tablet (20 mg total) by mouth every evening. Do not take this medication until you finish Diflucan. What changed:  when to take this additional instructions   sterile water Irrigate with as directed See admin instructions. Mix sterile water with white vinegar 4:1 and flush indwelling catheter nightly before bedtime   Xigduo XR 10-500 MG Tb24 Generic drug: Dapagliflozin Pro-metFORMIN ER Take 1 tablet by mouth daily before breakfast.        Contact information for follow-up providers     [facility] Follow up.   Specialty: Home Health Services Why: [facility] physical/occupational therapy Contact information: [address] [place] [address] [phone]         Teryl Lucy, MD. Schedule an appointment as soon as possible for a visit in 2 week(s).   Specialty: Orthopedic Surgery Contact information: [address] [place] [address] [phone]               Contact information for after-discharge care     Destination     [facility] Preferred SNF .   Service: Skilled Nursing Contact information: [address] [place] [address] [phone]                    Consultations: Orthopedic surgery   Procedures/Studies:  CT Angio Chest Pulmonary Embolism (PE) W or WO Contrast Result Date: 06/26/2023 CLINICAL DATA:  Elevated D-dimer level.  Hypoxia. EXAM: CT ANGIOGRAPHY CHEST WITH CONTRAST TECHNIQUE: Multidetector CT imaging of the chest was performed using the standard protocol during bolus administration of intravenous contrast. Multiplanar CT image reconstructions and MIPs were obtained to evaluate the vascular anatomy. RADIATION DOSE REDUCTION: This exam was performed according to the departmental dose-optimization program which includes automated exposure control, adjustment of the mA and/or kV according to patient size and/or use of iterative reconstruction technique. CONTRAST:  OMNIPAQUE IOHEXOL 350 MG/ML SOLN COMPARISON:  Chest radiograph 06/25/2023 FINDINGS: Cardiovascular: Motion artifact obscures the lower lung bases. No filling defect is identified in the pulmonary arterial tree to suggest pulmonary embolus. Atherosclerotic aortic arch and left  anterior descending coronary artery. Mediastinum/Nodes: Unremarkable Lungs/Pleura: Centrilobular emphysema. Moderate atelectasis in both lower lobes. There is some dependent mucus in the trachea right mainstem bronchus. Nonspecific 6 by 6 by 6 mm (volume = 100 mm^3) nodule lesion in the right upper lobe, image 80 series 6. Based on adjacent lesser nodularity this is probably inflammatory, but warrants follow up/surveillance. Upper Abdomen: Unremarkable Musculoskeletal: Prior ORIF of left proximal humeral fracture with plate and screw fixator. Old left rib fractures including nonunited fractures of the left eleventh and twelfth ribs  posteromedially. Chronic burst fracture T12 with 90% loss of height as shown on 04/13/2023, with about 3 mm posterior bony retropulsion which is chronic. Review of the MIP images confirms the above findings. IMPRESSION: 1. No filling defect is identified in the pulmonary arterial tree to suggest pulmonary embolus. 2. Moderate atelectasis in both lower lobes. 3. Nonspecific 6 by 6 by 6 mm (volume = 100 mm^ 3) nodule lesion in the right upper lobe. Based on adjacent lesser nodularity this is probably inflammatory, but warrants follow up/surveillance. Non-contrast chest CT at 6-12 months is recommended. If the nodule is stable at time of repeat CT, then future CT at 18-24 months (from today's scan) is considered optional for low-risk patients, but is recommended for high-risk patients. This recommendation follows the consensus statement: Guidelines for Management of Incidental Pulmonary Nodules Detected on CT Images: From the Fleischner Society 2017; Radiology 2017; 284:228-243. 4. Chronic burst fracture T12 with 90% loss of height as shown on 04/13/2023, with about 3 mm posterior bony retropulsion which is chronic. 5. Old left rib fractures including nonunited fractures of the left eleventh and twelfth ribs posteromedially. 6. Prior ORIF of left proximal humeral fracture with plate and screw fixator. 7.  Aortic Atherosclerosis (ICD10-I70.0).  Coronary atherosclerosis. Electronically Signed   By: Gaylyn Rong M.D.   On: 06/26/2023 14:53   DG CHEST PORT 1 VIEW Result Date: 06/25/2023 CLINICAL DATA:  62952 Hypoxemia 84132 EXAM: PORTABLE CHEST 1 VIEW COMPARISON:  June 20, 2023 FINDINGS: The cardiomediastinal silhouette is unchanged in contour. No pleural effusion. No pneumothorax. New band like opacity at the LEFT lung base and RIGHT cardiac border. Status post ORIF of the LEFT humerus. IMPRESSION: New band like opacity at the LEFT lung base and RIGHT cardiac border. Findings are favored to reflect atelectasis.  Electronically Signed   By: Meda Klinefelter M.D.   On: 06/25/2023 14:14   DG Shoulder Left Port Result Date: 06/22/2023 CLINICAL DATA:  Closed fracture of left proximal humerus, postop. EXAM: LEFT SHOULDER COMPARISON:  Preoperative imaging. FINDINGS: Lateral plate and screw fixation of proximal humeral fracture. Improved fracture alignment from preoperative imaging. Recent postsurgical change includes air and edema in the soft tissues. IMPRESSION: ORIF of proximal humeral fracture. Electronically Signed   By: Narda Rutherford M.D.   On: 06/22/2023 19:07   DG Shoulder Left Result Date: 06/22/2023 CLINICAL DATA:  Elective surgery. EXAM: LEFT SHOULDER - 2+ VIEW COMPARISON:  Radiograph 06/20/2023 FINDINGS: Ten fluoroscopic spot views of the left shoulder submitted from the operating room. Lateral plate and multi screw fixation of proximal humeral fracture. Fluoroscopy time 54 seconds. Dose 5.3 mGy. IMPRESSION: Intraoperative fluoroscopy during proximal humeral fracture fixation. Electronically Signed   By: Narda Rutherford M.D.   On: 06/22/2023 17:13   DG C-Arm 1-60 Min-No Report Result Date: 06/22/2023 Fluoroscopy was utilized by the requesting physician.  No radiographic interpretation.   DG C-Arm 1-60 Min-No Report Result Date: 06/22/2023 Fluoroscopy was utilized by the requesting  physician.  No radiographic interpretation.   CT HUMERUS LEFT W CONTRAST Result Date: 06/20/2023 CLINICAL DATA:  Upper arm trauma EXAM: CT OF THE UPPER LEFT EXTREMITY WITH CONTRAST TECHNIQUE: Multidetector CT imaging of the upper left extremity was performed according to the standard protocol following intravenous contrast administration. RADIATION DOSE REDUCTION: This exam was performed according to the departmental dose-optimization program which includes automated exposure control, adjustment of the mA and/or kV according to patient size and/or use of iterative reconstruction technique. CONTRAST:  OMNIPAQUE  IOHEXOL 300 MG/ML  SOLN COMPARISON:  X-ray left shoulder 06/20/2023 FINDINGS: Bones/Joint/Cartilage Acute markedly medially displaced and comminuted left humeral head and neck fracture. No other acute fracture of the humerus. No dislocation of the humerus. Ligaments Suboptimally assessed by CT. Muscles and Tendons Grossly unremarkable. Soft tissues Subcutaneus soft tissue edema. IMPRESSION: Acute markedly displaced and comminuted left humeral head and neck fracture. Electronically Signed   By: Tish Frederickson M.D.   On: 06/20/2023 18:07   DG Ribs Unilateral W/Chest Left Result Date: 06/20/2023 CLINICAL DATA:  Fall.  Bruising. EXAM: LEFT RIBS AND CHEST - 3+ VIEW COMPARISON:  None Available. FINDINGS: Bilateral lung fields are clear. Bilateral costophrenic angles are clear. Normal cardio-mediastinal silhouette. No acute rib fracture or focal rib lesion. Proximal left humerus fracture again seen. The soft tissues are within normal limits. IMPRESSION: *No acute cardiopulmonary abnormality. *No acute rib fracture or focal rib lesion. *Proximal left humerus fracture again seen. Electronically Signed   By: Jules Schick M.D.   On: 06/20/2023 15:26   DG Shoulder Left Result Date: 06/20/2023 CLINICAL DATA:  Fall.  Limited mobility. EXAM: LEFT SHOULDER - 2+ VIEW COMPARISON:  None Available. FINDINGS: There is proximally migrated and displaced acute fracture of the proximal left humerus. No other acute fracture or dislocation. No aggressive osseous lesion. Glenohumeral and acromioclavicular joints are normal in alignment. No soft tissue swelling. No radiopaque foreign bodies. IMPRESSION: *Acute displaced fracture of the proximal left humerus. Electronically Signed   By: Jules Schick M.D.   On: 06/20/2023 15:24     Subjective: - no chest pain, shortness of breath, no abdominal pain, nausea or vomiting.   Discharge Exam: BP (!) 146/92   Pulse 94   Temp 98.3 F (36.8 C) (Oral)   Resp 15   Ht 5\' 8"  (1.727 m)    Wt 53.7 kg   SpO2 90%   BMI 18.00 kg/m   General: Pt is alert, awake, not in acute distress Cardiovascular: RRR, S1/S2 +, no rubs, no gallops Respiratory: CTA bilaterally, no wheezing, no rhonchi Abdominal: Soft, NT, ND, bowel sounds + Extremities: no edema, no cyanosis    The results of significant diagnostics from this hospitalization (including imaging, microbiology, ancillary and laboratory) are listed below for reference.     Microbiology: Recent Results (from the past 240 hours)  Urine Culture     Status: Abnormal   Collection Time: 06/20/23 12:14 PM   Specimen: Urine, Random  Result Value Ref Range Status   Specimen Description   Final    URINE, RANDOM Performed at [facility], [address], [place], [place] [address]    Special Requests   Final    NONE Reflexed from [id number] Performed at [facility], [address], [place], [place] [address]    Culture (A)  Final    70,000 COLONIES/mL YEAST Performed at [facility], [address], [place], [place] [address]    Report Status 06/21/2023 FINAL  Final  Culture, blood (Routine X 2) w Reflex to ID Panel     Status: None   Collection Time: 06/20/23  8:05 PM   Specimen: BLOOD  Result Value Ref Range Status   Specimen Description   Final    BLOOD BLOOD LEFT HAND Performed at [facility], [address] [place], [place] [address]    Special Requests   Final    BOTTLES DRAWN AEROBIC AND ANAEROBIC Blood Culture results may not be optimal due to an inadequate volume of blood received in culture bottles Performed at [facility], [address], [place], [place] [address]    Culture   Final    NO GROWTH 5 DAYS Performed at [facility], [address], [place], [place] [address]    Report Status 06/25/2023 FINAL  Final  Culture, blood (Routine X 2) w Reflex to ID Panel     Status: None   Collection Time: 06/20/23  8:05 PM    Specimen: BLOOD  Result Value Ref Range Status   Specimen Description   Final    BLOOD RIGHT ANTECUBITAL Performed at [facility], [address], [place], [place] [address]    Special Requests   Final    BOTTLES DRAWN AEROBIC AND ANAEROBIC Blood Culture results may not be optimal due to an inadequate volume of blood received in culture bottles Performed at [facility], [address], [place], [place] [address]    Culture   Final    NO GROWTH 5 DAYS Performed at [facility], [address], [place], [place] [address]    Report Status 06/25/2023 FINAL  Final  Surgical PCR screen     Status: None   Collection Time: 06/21/23  3:24 PM   Specimen: Nasal Mucosa; Nasal Swab  Result Value Ref Range Status   MRSA, PCR NEGATIVE NEGATIVE Final   Staphylococcus aureus NEGATIVE NEGATIVE Final    Comment: (NOTE) The Xpert SA Assay (FDA approved for NASAL specimens in patients 11 years of age and older), is one component of a comprehensive surveillance program. It is not intended to diagnose infection nor to guide or monitor treatment. Performed at [facility], [address], [place], [place] [address]      Labs: Basic Metabolic Panel: Recent Labs  Lab 06/22/23 0521 06/23/23 0432 06/23/23 0641 06/24/23 0612 06/25/23 1440 06/26/23 0456 06/27/23 0512  NA 136 136  --  135 137 135 132*  K 3.7 5.8* 4.7 4.4 4.1 3.0* 3.5  CL 100 106  --  99 97* 94* 94*  CO2 28 22  --  29 33* 33* 31  GLUCOSE 167* 260*  --  240* 334* 218* 233*  BUN 25* 24*  --  25* 26* 21 21  CREATININE 0.97 0.87  --  0.89 0.98 0.65 0.56*  CALCIUM 8.3* 7.4*  --  8.3* 8.4* 8.1* 8.0*  MG 1.6*  --   --  1.8  --  1.4* 1.6*  PHOS 3.4  --   --  2.0*  --  2.0*  --    Liver Function Tests: Recent Labs  Lab 06/22/23 0521 06/24/23 0612 06/26/23 0456  AST 13* 14* 22  ALT 13 16 21   ALKPHOS 52 61 60  BILITOT 0.6 0.4 0.6  PROT 5.0* 5.3* 5.4*  ALBUMIN 2.1*  2.4* 2.4*   CBC: Recent Labs  Lab 06/20/23 1316 06/21/23 0504 06/22/23 5638 06/23/23 0432 06/24/23 7564  06/26/23 0456  WBC 8.9 6.6 7.7 6.1 11.5* 9.1  NEUTROABS 7.2  --   --   --   --   --   HGB 12.9* 10.0* 9.9* 9.0* 9.9* 10.2*  HCT 42.9 32.4* 32.8* 30.3* 33.1* 33.2*  MCV 86.8 87.1 87.7 90.2 89.0 86.2  PLT 234 202 207 197 266 304   CBG: Recent Labs  Lab 06/26/23 1121 06/26/23 1619 06/26/23 1742 06/26/23 2030 06/27/23 0729  GLUCAP 321* 404* 311* 146* 219*   Hgb A1c No results for input(s): "HGBA1C" in the last 72 hours. Lipid Profile No results for input(s): "CHOL", "HDL", "LDLCALC", "TRIG", "CHOLHDL", "LDLDIRECT" in the last 72 hours. Thyroid function studies No results for input(s): "TSH", "T4TOTAL", "T3FREE", "THYROIDAB" in the last 72 hours.  Invalid input(s): "FREET3" Urinalysis    Component Value Date/Time   COLORURINE YELLOW 06/20/2023 1214   APPEARANCEUR TURBID (A) 06/20/2023 1214   LABSPEC 1.013 06/20/2023 1214   PHURINE 5.0 06/20/2023 1214   GLUCOSEU >=500 (A) 06/20/2023 1214   HGBUR MODERATE (A) 06/20/2023 1214   BILIRUBINUR NEGATIVE 06/20/2023 1214   BILIRUBINUR negative 06/23/2022 1731   KETONESUR NEGATIVE 06/20/2023 1214   PROTEINUR 100 (A) 06/20/2023 1214   UROBILINOGEN 0.2 06/23/2022 1731   NITRITE NEGATIVE 06/20/2023 1214   LEUKOCYTESUR LARGE (A) 06/20/2023 1214    FURTHER DISCHARGE INSTRUCTIONS:   Get Medicines reviewed and adjusted: Please take all your medications with you for your next visit with your Primary MD   Laboratory/radiological data: Please request your Primary MD to go over all hospital tests and procedure/radiological results at the follow up, please ask your Primary MD to get all Hospital records sent to his/her office.   In some cases, they will be blood work, cultures and biopsy results pending at the time of your discharge. Please request that your primary care M.D. goes through all the records of your hospital data and  follows up on these results.   Also Note the following: If you experience worsening of your admission symptoms, develop shortness of breath, life threatening emergency, suicidal or homicidal thoughts you must seek medical attention immediately by calling 911 or calling your MD immediately  if symptoms less severe.   You must read complete instructions/literature along with all the possible adverse reactions/side effects for all the Medicines you take and that have been prescribed to you. Take any new Medicines after you have completely understood and accpet all the possible adverse reactions/side effects.    Do not drive when taking Pain medications or sleeping medications (Benzodaizepines)   Do not take more than prescribed Pain, Sleep and Anxiety Medications. It is not advisable to combine anxiety,sleep and pain medications without talking with your primary care practitioner   Special Instructions: If you have smoked or chewed Tobacco  in the last 2 yrs please stop smoking, stop any regular Alcohol  and or any Recreational drug use.   Wear Seat belts while driving.   Please note: You were cared for by a hospitalist during your hospital stay. Once you are discharged, your primary care physician will handle any further medical issues. Please note that NO REFILLS for any discharge medications will be authorized once you are discharged, as it is imperative that you return to your primary care physician (or establish a relationship with a primary care physician if you do not have one) for your post hospital discharge needs so that they can reassess your need for medications and monitor your lab values.  Time coordinating discharge:  40 minutes  SIGNED:  Pamella Pert, MD, PhD 06/27/2023, 8:25 AM

## 2023-06-27 NOTE — Care Management Important Message (Signed)
 Important Message  Patient Details IM Letter given to the Patient. Name: Dustin Duffy MRN: [id number] Date of Birth: 1956-01-18   Important Message Given:  Yes - Medicare IM     Caren Macadam 06/27/2023, 9:45 AM

## 2023-06-27 NOTE — Progress Notes (Addendum)
 Attempted to call report x1 to [facility]. Report called successfully to Amilia.

## 2023-06-28 DIAGNOSIS — E119 Type 2 diabetes mellitus without complications: Secondary | ICD-10-CM | POA: Diagnosis not present

## 2023-06-28 DIAGNOSIS — N182 Chronic kidney disease, stage 2 (mild): Secondary | ICD-10-CM | POA: Diagnosis not present

## 2023-06-28 DIAGNOSIS — N401 Enlarged prostate with lower urinary tract symptoms: Secondary | ICD-10-CM | POA: Diagnosis not present

## 2023-06-28 DIAGNOSIS — D649 Anemia, unspecified: Secondary | ICD-10-CM | POA: Diagnosis not present

## 2023-06-28 DIAGNOSIS — A419 Sepsis, unspecified organism: Secondary | ICD-10-CM | POA: Diagnosis not present

## 2023-06-28 DIAGNOSIS — N390 Urinary tract infection, site not specified: Secondary | ICD-10-CM | POA: Diagnosis not present

## 2023-06-28 DIAGNOSIS — R000 Tachycardia, unspecified: Secondary | ICD-10-CM | POA: Diagnosis not present

## 2023-06-28 DIAGNOSIS — S42302A Unspecified fracture of shaft of humerus, left arm, initial encounter for closed fracture: Secondary | ICD-10-CM | POA: Diagnosis not present

## 2023-06-30 DIAGNOSIS — M6281 Muscle weakness (generalized): Secondary | ICD-10-CM | POA: Diagnosis not present

## 2023-06-30 DIAGNOSIS — E43 Unspecified severe protein-calorie malnutrition: Secondary | ICD-10-CM | POA: Diagnosis not present

## 2023-06-30 DIAGNOSIS — R278 Other lack of coordination: Secondary | ICD-10-CM | POA: Diagnosis not present

## 2023-06-30 DIAGNOSIS — N401 Enlarged prostate with lower urinary tract symptoms: Secondary | ICD-10-CM | POA: Diagnosis not present

## 2023-06-30 DIAGNOSIS — R296 Repeated falls: Secondary | ICD-10-CM | POA: Diagnosis not present

## 2023-06-30 DIAGNOSIS — R2689 Other abnormalities of gait and mobility: Secondary | ICD-10-CM | POA: Diagnosis not present

## 2023-06-30 DIAGNOSIS — S42302A Unspecified fracture of shaft of humerus, left arm, initial encounter for closed fracture: Secondary | ICD-10-CM | POA: Diagnosis not present

## 2023-06-30 DIAGNOSIS — A419 Sepsis, unspecified organism: Secondary | ICD-10-CM | POA: Diagnosis not present

## 2023-06-30 DIAGNOSIS — D649 Anemia, unspecified: Secondary | ICD-10-CM | POA: Diagnosis not present

## 2023-06-30 DIAGNOSIS — S42292D Other displaced fracture of upper end of left humerus, subsequent encounter for fracture with routine healing: Secondary | ICD-10-CM | POA: Diagnosis not present

## 2023-06-30 DIAGNOSIS — E119 Type 2 diabetes mellitus without complications: Secondary | ICD-10-CM | POA: Diagnosis not present

## 2023-06-30 DIAGNOSIS — N390 Urinary tract infection, site not specified: Secondary | ICD-10-CM | POA: Diagnosis not present

## 2023-06-30 DIAGNOSIS — N182 Chronic kidney disease, stage 2 (mild): Secondary | ICD-10-CM | POA: Diagnosis not present

## 2023-06-30 DIAGNOSIS — R000 Tachycardia, unspecified: Secondary | ICD-10-CM | POA: Diagnosis not present

## 2023-06-30 DIAGNOSIS — D849 Immunodeficiency, unspecified: Secondary | ICD-10-CM | POA: Diagnosis not present

## 2023-07-03 DIAGNOSIS — S42302A Unspecified fracture of shaft of humerus, left arm, initial encounter for closed fracture: Secondary | ICD-10-CM | POA: Diagnosis not present

## 2023-07-03 DIAGNOSIS — D649 Anemia, unspecified: Secondary | ICD-10-CM | POA: Diagnosis not present

## 2023-07-03 DIAGNOSIS — N390 Urinary tract infection, site not specified: Secondary | ICD-10-CM | POA: Diagnosis not present

## 2023-07-03 DIAGNOSIS — E119 Type 2 diabetes mellitus without complications: Secondary | ICD-10-CM | POA: Diagnosis not present

## 2023-07-03 DIAGNOSIS — R000 Tachycardia, unspecified: Secondary | ICD-10-CM | POA: Diagnosis not present

## 2023-07-03 DIAGNOSIS — A419 Sepsis, unspecified organism: Secondary | ICD-10-CM | POA: Diagnosis not present

## 2023-07-03 DIAGNOSIS — N182 Chronic kidney disease, stage 2 (mild): Secondary | ICD-10-CM | POA: Diagnosis not present

## 2023-07-03 DIAGNOSIS — N401 Enlarged prostate with lower urinary tract symptoms: Secondary | ICD-10-CM | POA: Diagnosis not present

## 2023-07-04 DIAGNOSIS — R2689 Other abnormalities of gait and mobility: Secondary | ICD-10-CM | POA: Diagnosis not present

## 2023-07-04 DIAGNOSIS — E119 Type 2 diabetes mellitus without complications: Secondary | ICD-10-CM | POA: Diagnosis not present

## 2023-07-04 DIAGNOSIS — R296 Repeated falls: Secondary | ICD-10-CM | POA: Diagnosis not present

## 2023-07-04 DIAGNOSIS — N182 Chronic kidney disease, stage 2 (mild): Secondary | ICD-10-CM | POA: Diagnosis not present

## 2023-07-04 DIAGNOSIS — N390 Urinary tract infection, site not specified: Secondary | ICD-10-CM | POA: Diagnosis not present

## 2023-07-04 DIAGNOSIS — D649 Anemia, unspecified: Secondary | ICD-10-CM | POA: Diagnosis not present

## 2023-07-04 DIAGNOSIS — S42292D Other displaced fracture of upper end of left humerus, subsequent encounter for fracture with routine healing: Secondary | ICD-10-CM | POA: Diagnosis not present

## 2023-07-04 DIAGNOSIS — N401 Enlarged prostate with lower urinary tract symptoms: Secondary | ICD-10-CM | POA: Diagnosis not present

## 2023-07-04 DIAGNOSIS — A419 Sepsis, unspecified organism: Secondary | ICD-10-CM | POA: Diagnosis not present

## 2023-07-04 DIAGNOSIS — R000 Tachycardia, unspecified: Secondary | ICD-10-CM | POA: Diagnosis not present

## 2023-07-04 DIAGNOSIS — R278 Other lack of coordination: Secondary | ICD-10-CM | POA: Diagnosis not present

## 2023-07-04 DIAGNOSIS — D849 Immunodeficiency, unspecified: Secondary | ICD-10-CM | POA: Diagnosis not present

## 2023-07-04 DIAGNOSIS — M6281 Muscle weakness (generalized): Secondary | ICD-10-CM | POA: Diagnosis not present

## 2023-07-04 DIAGNOSIS — S42302A Unspecified fracture of shaft of humerus, left arm, initial encounter for closed fracture: Secondary | ICD-10-CM | POA: Diagnosis not present

## 2023-07-04 DIAGNOSIS — E43 Unspecified severe protein-calorie malnutrition: Secondary | ICD-10-CM | POA: Diagnosis not present

## 2023-07-06 DIAGNOSIS — S42302A Unspecified fracture of shaft of humerus, left arm, initial encounter for closed fracture: Secondary | ICD-10-CM | POA: Diagnosis not present

## 2023-07-06 DIAGNOSIS — N401 Enlarged prostate with lower urinary tract symptoms: Secondary | ICD-10-CM | POA: Diagnosis not present

## 2023-07-06 DIAGNOSIS — A419 Sepsis, unspecified organism: Secondary | ICD-10-CM | POA: Diagnosis not present

## 2023-07-06 DIAGNOSIS — N390 Urinary tract infection, site not specified: Secondary | ICD-10-CM | POA: Diagnosis not present

## 2023-07-06 DIAGNOSIS — N182 Chronic kidney disease, stage 2 (mild): Secondary | ICD-10-CM | POA: Diagnosis not present

## 2023-07-06 DIAGNOSIS — E119 Type 2 diabetes mellitus without complications: Secondary | ICD-10-CM | POA: Diagnosis not present

## 2023-07-06 DIAGNOSIS — D649 Anemia, unspecified: Secondary | ICD-10-CM | POA: Diagnosis not present

## 2023-07-06 DIAGNOSIS — R000 Tachycardia, unspecified: Secondary | ICD-10-CM | POA: Diagnosis not present

## 2023-07-07 DIAGNOSIS — S42292A Other displaced fracture of upper end of left humerus, initial encounter for closed fracture: Secondary | ICD-10-CM | POA: Diagnosis not present

## 2023-07-07 DIAGNOSIS — D849 Immunodeficiency, unspecified: Secondary | ICD-10-CM | POA: Diagnosis not present

## 2023-07-07 DIAGNOSIS — M6281 Muscle weakness (generalized): Secondary | ICD-10-CM | POA: Diagnosis not present

## 2023-07-07 DIAGNOSIS — R296 Repeated falls: Secondary | ICD-10-CM | POA: Diagnosis not present

## 2023-07-07 DIAGNOSIS — S42292D Other displaced fracture of upper end of left humerus, subsequent encounter for fracture with routine healing: Secondary | ICD-10-CM | POA: Diagnosis not present

## 2023-07-07 DIAGNOSIS — E43 Unspecified severe protein-calorie malnutrition: Secondary | ICD-10-CM | POA: Diagnosis not present

## 2023-07-07 DIAGNOSIS — R2689 Other abnormalities of gait and mobility: Secondary | ICD-10-CM | POA: Diagnosis not present

## 2023-07-07 DIAGNOSIS — R278 Other lack of coordination: Secondary | ICD-10-CM | POA: Diagnosis not present

## 2023-07-10 DIAGNOSIS — D649 Anemia, unspecified: Secondary | ICD-10-CM | POA: Diagnosis not present

## 2023-07-10 DIAGNOSIS — N390 Urinary tract infection, site not specified: Secondary | ICD-10-CM | POA: Diagnosis not present

## 2023-07-10 DIAGNOSIS — A419 Sepsis, unspecified organism: Secondary | ICD-10-CM | POA: Diagnosis not present

## 2023-07-10 DIAGNOSIS — R000 Tachycardia, unspecified: Secondary | ICD-10-CM | POA: Diagnosis not present

## 2023-07-10 DIAGNOSIS — E119 Type 2 diabetes mellitus without complications: Secondary | ICD-10-CM | POA: Diagnosis not present

## 2023-07-10 DIAGNOSIS — S42302A Unspecified fracture of shaft of humerus, left arm, initial encounter for closed fracture: Secondary | ICD-10-CM | POA: Diagnosis not present

## 2023-07-10 DIAGNOSIS — N401 Enlarged prostate with lower urinary tract symptoms: Secondary | ICD-10-CM | POA: Diagnosis not present

## 2023-07-10 DIAGNOSIS — N182 Chronic kidney disease, stage 2 (mild): Secondary | ICD-10-CM | POA: Diagnosis not present

## 2023-07-11 DIAGNOSIS — R278 Other lack of coordination: Secondary | ICD-10-CM | POA: Diagnosis not present

## 2023-07-11 DIAGNOSIS — R2689 Other abnormalities of gait and mobility: Secondary | ICD-10-CM | POA: Diagnosis not present

## 2023-07-11 DIAGNOSIS — D849 Immunodeficiency, unspecified: Secondary | ICD-10-CM | POA: Diagnosis not present

## 2023-07-11 DIAGNOSIS — M6281 Muscle weakness (generalized): Secondary | ICD-10-CM | POA: Diagnosis not present

## 2023-07-11 DIAGNOSIS — R296 Repeated falls: Secondary | ICD-10-CM | POA: Diagnosis not present

## 2023-07-11 DIAGNOSIS — E43 Unspecified severe protein-calorie malnutrition: Secondary | ICD-10-CM | POA: Diagnosis not present

## 2023-07-11 DIAGNOSIS — S42292D Other displaced fracture of upper end of left humerus, subsequent encounter for fracture with routine healing: Secondary | ICD-10-CM | POA: Diagnosis not present

## 2023-07-12 DIAGNOSIS — D649 Anemia, unspecified: Secondary | ICD-10-CM | POA: Diagnosis not present

## 2023-07-12 DIAGNOSIS — N182 Chronic kidney disease, stage 2 (mild): Secondary | ICD-10-CM | POA: Diagnosis not present

## 2023-07-12 DIAGNOSIS — R000 Tachycardia, unspecified: Secondary | ICD-10-CM | POA: Diagnosis not present

## 2023-07-12 DIAGNOSIS — A419 Sepsis, unspecified organism: Secondary | ICD-10-CM | POA: Diagnosis not present

## 2023-07-12 DIAGNOSIS — E119 Type 2 diabetes mellitus without complications: Secondary | ICD-10-CM | POA: Diagnosis not present

## 2023-07-12 DIAGNOSIS — N390 Urinary tract infection, site not specified: Secondary | ICD-10-CM | POA: Diagnosis not present

## 2023-07-12 DIAGNOSIS — S42302A Unspecified fracture of shaft of humerus, left arm, initial encounter for closed fracture: Secondary | ICD-10-CM | POA: Diagnosis not present

## 2023-07-12 DIAGNOSIS — N401 Enlarged prostate with lower urinary tract symptoms: Secondary | ICD-10-CM | POA: Diagnosis not present

## 2023-07-14 DIAGNOSIS — M6281 Muscle weakness (generalized): Secondary | ICD-10-CM | POA: Diagnosis not present

## 2023-07-14 DIAGNOSIS — R296 Repeated falls: Secondary | ICD-10-CM | POA: Diagnosis not present

## 2023-07-14 DIAGNOSIS — R278 Other lack of coordination: Secondary | ICD-10-CM | POA: Diagnosis not present

## 2023-07-14 DIAGNOSIS — D849 Immunodeficiency, unspecified: Secondary | ICD-10-CM | POA: Diagnosis not present

## 2023-07-14 DIAGNOSIS — E43 Unspecified severe protein-calorie malnutrition: Secondary | ICD-10-CM | POA: Diagnosis not present

## 2023-07-14 DIAGNOSIS — S42292D Other displaced fracture of upper end of left humerus, subsequent encounter for fracture with routine healing: Secondary | ICD-10-CM | POA: Diagnosis not present

## 2023-07-14 DIAGNOSIS — R2689 Other abnormalities of gait and mobility: Secondary | ICD-10-CM | POA: Diagnosis not present

## 2023-07-18 DIAGNOSIS — M6281 Muscle weakness (generalized): Secondary | ICD-10-CM | POA: Diagnosis not present

## 2023-07-18 DIAGNOSIS — R296 Repeated falls: Secondary | ICD-10-CM | POA: Diagnosis not present

## 2023-07-18 DIAGNOSIS — S42292D Other displaced fracture of upper end of left humerus, subsequent encounter for fracture with routine healing: Secondary | ICD-10-CM | POA: Diagnosis not present

## 2023-07-18 DIAGNOSIS — R278 Other lack of coordination: Secondary | ICD-10-CM | POA: Diagnosis not present

## 2023-07-18 DIAGNOSIS — R2689 Other abnormalities of gait and mobility: Secondary | ICD-10-CM | POA: Diagnosis not present

## 2023-07-18 DIAGNOSIS — E43 Unspecified severe protein-calorie malnutrition: Secondary | ICD-10-CM | POA: Diagnosis not present

## 2023-07-18 DIAGNOSIS — D849 Immunodeficiency, unspecified: Secondary | ICD-10-CM | POA: Diagnosis not present

## 2023-07-19 DIAGNOSIS — N182 Chronic kidney disease, stage 2 (mild): Secondary | ICD-10-CM | POA: Diagnosis not present

## 2023-07-19 DIAGNOSIS — S42302A Unspecified fracture of shaft of humerus, left arm, initial encounter for closed fracture: Secondary | ICD-10-CM | POA: Diagnosis not present

## 2023-07-19 DIAGNOSIS — N401 Enlarged prostate with lower urinary tract symptoms: Secondary | ICD-10-CM | POA: Diagnosis not present

## 2023-07-19 DIAGNOSIS — R000 Tachycardia, unspecified: Secondary | ICD-10-CM | POA: Diagnosis not present

## 2023-07-19 DIAGNOSIS — N390 Urinary tract infection, site not specified: Secondary | ICD-10-CM | POA: Diagnosis not present

## 2023-07-19 DIAGNOSIS — D649 Anemia, unspecified: Secondary | ICD-10-CM | POA: Diagnosis not present

## 2023-07-19 DIAGNOSIS — A419 Sepsis, unspecified organism: Secondary | ICD-10-CM | POA: Diagnosis not present

## 2023-07-19 DIAGNOSIS — E119 Type 2 diabetes mellitus without complications: Secondary | ICD-10-CM | POA: Diagnosis not present

## 2023-07-20 DIAGNOSIS — A419 Sepsis, unspecified organism: Secondary | ICD-10-CM | POA: Diagnosis not present

## 2023-07-20 DIAGNOSIS — N182 Chronic kidney disease, stage 2 (mild): Secondary | ICD-10-CM | POA: Diagnosis not present

## 2023-07-20 DIAGNOSIS — N401 Enlarged prostate with lower urinary tract symptoms: Secondary | ICD-10-CM | POA: Diagnosis not present

## 2023-07-20 DIAGNOSIS — R000 Tachycardia, unspecified: Secondary | ICD-10-CM | POA: Diagnosis not present

## 2023-07-20 DIAGNOSIS — D649 Anemia, unspecified: Secondary | ICD-10-CM | POA: Diagnosis not present

## 2023-07-20 DIAGNOSIS — S42302A Unspecified fracture of shaft of humerus, left arm, initial encounter for closed fracture: Secondary | ICD-10-CM | POA: Diagnosis not present

## 2023-07-20 DIAGNOSIS — N390 Urinary tract infection, site not specified: Secondary | ICD-10-CM | POA: Diagnosis not present

## 2023-07-20 DIAGNOSIS — E119 Type 2 diabetes mellitus without complications: Secondary | ICD-10-CM | POA: Diagnosis not present

## 2023-07-25 ENCOUNTER — Telehealth: Payer: Self-pay | Admitting: *Deleted

## 2023-07-25 DIAGNOSIS — I129 Hypertensive chronic kidney disease with stage 1 through stage 4 chronic kidney disease, or unspecified chronic kidney disease: Secondary | ICD-10-CM | POA: Diagnosis not present

## 2023-07-25 DIAGNOSIS — D8481 Immunodeficiency due to conditions classified elsewhere: Secondary | ICD-10-CM | POA: Diagnosis not present

## 2023-07-25 DIAGNOSIS — E785 Hyperlipidemia, unspecified: Secondary | ICD-10-CM | POA: Diagnosis not present

## 2023-07-25 DIAGNOSIS — S42292D Other displaced fracture of upper end of left humerus, subsequent encounter for fracture with routine healing: Secondary | ICD-10-CM | POA: Diagnosis not present

## 2023-07-25 DIAGNOSIS — A419 Sepsis, unspecified organism: Secondary | ICD-10-CM | POA: Diagnosis not present

## 2023-07-25 DIAGNOSIS — E876 Hypokalemia: Secondary | ICD-10-CM | POA: Diagnosis not present

## 2023-07-25 DIAGNOSIS — N138 Other obstructive and reflux uropathy: Secondary | ICD-10-CM | POA: Diagnosis not present

## 2023-07-25 DIAGNOSIS — W19XXXD Unspecified fall, subsequent encounter: Secondary | ICD-10-CM | POA: Diagnosis not present

## 2023-07-25 DIAGNOSIS — D631 Anemia in chronic kidney disease: Secondary | ICD-10-CM | POA: Diagnosis not present

## 2023-07-25 DIAGNOSIS — N182 Chronic kidney disease, stage 2 (mild): Secondary | ICD-10-CM | POA: Diagnosis not present

## 2023-07-25 DIAGNOSIS — F32A Depression, unspecified: Secondary | ICD-10-CM | POA: Diagnosis not present

## 2023-07-25 DIAGNOSIS — E1122 Type 2 diabetes mellitus with diabetic chronic kidney disease: Secondary | ICD-10-CM | POA: Diagnosis not present

## 2023-07-25 DIAGNOSIS — E43 Unspecified severe protein-calorie malnutrition: Secondary | ICD-10-CM | POA: Diagnosis not present

## 2023-07-25 DIAGNOSIS — I959 Hypotension, unspecified: Secondary | ICD-10-CM | POA: Diagnosis not present

## 2023-07-25 DIAGNOSIS — Z466 Encounter for fitting and adjustment of urinary device: Secondary | ICD-10-CM | POA: Diagnosis not present

## 2023-07-25 DIAGNOSIS — H548 Legal blindness, as defined in USA: Secondary | ICD-10-CM | POA: Diagnosis not present

## 2023-07-25 DIAGNOSIS — Z7982 Long term (current) use of aspirin: Secondary | ICD-10-CM | POA: Diagnosis not present

## 2023-07-25 DIAGNOSIS — M6281 Muscle weakness (generalized): Secondary | ICD-10-CM | POA: Diagnosis not present

## 2023-07-25 DIAGNOSIS — J9811 Atelectasis: Secondary | ICD-10-CM | POA: Diagnosis not present

## 2023-07-25 DIAGNOSIS — J9601 Acute respiratory failure with hypoxia: Secondary | ICD-10-CM | POA: Diagnosis not present

## 2023-07-25 DIAGNOSIS — R000 Tachycardia, unspecified: Secondary | ICD-10-CM | POA: Diagnosis not present

## 2023-07-25 DIAGNOSIS — N401 Enlarged prostate with lower urinary tract symptoms: Secondary | ICD-10-CM | POA: Diagnosis not present

## 2023-07-25 DIAGNOSIS — K219 Gastro-esophageal reflux disease without esophagitis: Secondary | ICD-10-CM | POA: Diagnosis not present

## 2023-07-25 DIAGNOSIS — N390 Urinary tract infection, site not specified: Secondary | ICD-10-CM | POA: Diagnosis not present

## 2023-07-25 NOTE — Progress Notes (Signed)
 Complex Care Management Care Guide Note  07/25/2023 Name: Dustin Duffy MRN: [id number] DOB: 07-20-1955  Dustin Duffy is a 68 y.o. year old male who is a primary care patient of Collins, Dana, DO and is actively engaged with the care management team. I reached out to Deneen Finical by phone today to assist with re-scheduling  with the BSW.  Follow up plan: Telephone appointment with complex care management team member scheduled for:  5/6  Dustin Duffy  [facility]  [facility], [facility] Guide  Direct Dial: [phone]  Fax [phone]

## 2023-07-26 DIAGNOSIS — N182 Chronic kidney disease, stage 2 (mild): Secondary | ICD-10-CM | POA: Diagnosis not present

## 2023-07-26 DIAGNOSIS — E1122 Type 2 diabetes mellitus with diabetic chronic kidney disease: Secondary | ICD-10-CM | POA: Diagnosis not present

## 2023-07-26 DIAGNOSIS — N390 Urinary tract infection, site not specified: Secondary | ICD-10-CM | POA: Diagnosis not present

## 2023-07-26 DIAGNOSIS — S42292D Other displaced fracture of upper end of left humerus, subsequent encounter for fracture with routine healing: Secondary | ICD-10-CM | POA: Diagnosis not present

## 2023-07-26 DIAGNOSIS — A419 Sepsis, unspecified organism: Secondary | ICD-10-CM | POA: Diagnosis not present

## 2023-07-26 DIAGNOSIS — I129 Hypertensive chronic kidney disease with stage 1 through stage 4 chronic kidney disease, or unspecified chronic kidney disease: Secondary | ICD-10-CM | POA: Diagnosis not present

## 2023-07-28 DIAGNOSIS — W19XXXA Unspecified fall, initial encounter: Secondary | ICD-10-CM | POA: Diagnosis not present

## 2023-07-28 DIAGNOSIS — R0989 Other specified symptoms and signs involving the circulatory and respiratory systems: Secondary | ICD-10-CM | POA: Diagnosis not present

## 2023-07-28 DIAGNOSIS — H548 Legal blindness, as defined in USA: Secondary | ICD-10-CM | POA: Diagnosis not present

## 2023-07-28 DIAGNOSIS — H4720 Unspecified optic atrophy: Secondary | ICD-10-CM | POA: Diagnosis not present

## 2023-07-28 DIAGNOSIS — Z09 Encounter for follow-up examination after completed treatment for conditions other than malignant neoplasm: Secondary | ICD-10-CM | POA: Diagnosis not present

## 2023-07-28 DIAGNOSIS — Z978 Presence of other specified devices: Secondary | ICD-10-CM | POA: Diagnosis not present

## 2023-07-28 DIAGNOSIS — R000 Tachycardia, unspecified: Secondary | ICD-10-CM | POA: Diagnosis not present

## 2023-07-28 DIAGNOSIS — R296 Repeated falls: Secondary | ICD-10-CM | POA: Diagnosis not present

## 2023-07-28 DIAGNOSIS — J189 Pneumonia, unspecified organism: Secondary | ICD-10-CM | POA: Diagnosis not present

## 2023-07-28 DIAGNOSIS — G5711 Meralgia paresthetica, right lower limb: Secondary | ICD-10-CM | POA: Diagnosis not present

## 2023-07-31 DIAGNOSIS — A419 Sepsis, unspecified organism: Secondary | ICD-10-CM | POA: Diagnosis not present

## 2023-07-31 DIAGNOSIS — I129 Hypertensive chronic kidney disease with stage 1 through stage 4 chronic kidney disease, or unspecified chronic kidney disease: Secondary | ICD-10-CM | POA: Diagnosis not present

## 2023-07-31 DIAGNOSIS — S42292D Other displaced fracture of upper end of left humerus, subsequent encounter for fracture with routine healing: Secondary | ICD-10-CM | POA: Diagnosis not present

## 2023-07-31 DIAGNOSIS — N390 Urinary tract infection, site not specified: Secondary | ICD-10-CM | POA: Diagnosis not present

## 2023-07-31 DIAGNOSIS — N182 Chronic kidney disease, stage 2 (mild): Secondary | ICD-10-CM | POA: Diagnosis not present

## 2023-07-31 DIAGNOSIS — E1122 Type 2 diabetes mellitus with diabetic chronic kidney disease: Secondary | ICD-10-CM | POA: Diagnosis not present

## 2023-08-01 ENCOUNTER — Other Ambulatory Visit: Payer: Self-pay | Admitting: *Deleted

## 2023-08-01 ENCOUNTER — Other Ambulatory Visit: Payer: Self-pay

## 2023-08-01 DIAGNOSIS — N390 Urinary tract infection, site not specified: Secondary | ICD-10-CM | POA: Diagnosis not present

## 2023-08-01 DIAGNOSIS — A419 Sepsis, unspecified organism: Secondary | ICD-10-CM | POA: Diagnosis not present

## 2023-08-01 DIAGNOSIS — S42292D Other displaced fracture of upper end of left humerus, subsequent encounter for fracture with routine healing: Secondary | ICD-10-CM | POA: Diagnosis not present

## 2023-08-01 NOTE — Patient Outreach (Signed)
 Complex Care Management   Visit Note  08/01/2023  Name:  Dustin Duffy MRN: [id number] DOB: September 19, 1955  Situation: Referral received for Complex Care Management related to  falls  I obtained verbal consent from Caregiver Patient.  Visit completed with patient and [name], caregiver  on the phone  Background:   Past Medical History:  Diagnosis Date   BPH without urinary obstruction    Diabetes mellitus type 2 in nonobese (HCC)    Dyslipidemia    Essential hypertension    Renal impairment    Visual impairment     Assessment: Patient Reported Symptoms:  Cognitive Cognitive Status: Normal speech and language skills, Alert and oriented to person, place, and time Cognitive/Intellectual Conditions Management [RPT]: None reported or documented in medical history or problem list      Neurological Neurological Review of Symptoms: No symptoms reported    HEENT HEENT Symptoms Reported: No symptoms reported      Cardiovascular Cardiovascular Symptoms Reported: Swelling in legs or feet Does patient have uncontrolled Hypertension?: No    Respiratory Respiratory Symptoms Reported: No symptoms reported    Endocrine Patient reports the following symptoms related to hypoglycemia or hyperglycemia : No symptoms reported Is patient diabetic?: Yes Is patient checking blood sugars at home?: Yes Endocrine Conditions: Diabetes  Gastrointestinal Gastrointestinal Symptoms Reported: No symptoms reported      Genitourinary Genitourinary Symptoms Reported: Pain with urination Genitourinary Management Strategies: Catheter, indwelling Indwelling Catheter Inserted: 07/28/23  Integumentary Integumentary Symptoms Reported: Incision    Musculoskeletal Musculoskelatal Symptoms Reviewed: Difficulty walking        Psychosocial Psychosocial Symptoms Reported: No symptoms reported            08/01/2023    3:19 PM  Depression screen PHQ 2/9  Decreased Interest 0  Down, Depressed, Hopeless 1  PHQ - 2  Score 1    There were no vitals filed for this visit.  Medications Reviewed Today     Reviewed by Holland Lundborg, RN (Registered Nurse) on 08/01/23 at 1545  Med List Status: <None>   Medication Order Taking? Sig Documenting Provider Last Dose Status Informant  acetaminophen  (TYLENOL ) 325 MG tablet [id number] Yes Take 2 tablets (650 mg total) by mouth every 6 (six) hours as needed for mild pain (pain score 1-3) (or Fever >/= 101). Lorita Rosa, MD Taking Active Care Giver  amitriptyline  (ELAVIL ) 50 MG tablet [id number] Yes Take 50 mg by mouth at bedtime. [provider] Taking Active Care Giver  aspirin  EC 81 MG tablet [id number] Yes Take 81 mg by mouth in the morning. Swallow whole. [provider] Taking Active Care Giver  cholecalciferol (VITAMIN D3) 25 MCG (1000 UNIT) tablet [id number] Yes Take 1,000 Units by mouth every Monday, Wednesday, and Friday. [provider] Taking Active Care Giver  fludrocortisone  (FLORINEF ) 0.1 MG tablet [id number] Yes Take 1 tablet (0.1 mg total) by mouth daily. Leona Rake, MD Taking Active Care Giver  gabapentin  (NEURONTIN ) 300 MG capsule [id number] Yes Take 900 mg by mouth at bedtime. [provider] Taking Active Care Giver           Med Note Guido Leeks, Lavonia Powers   Tue Jun 20, 2023  9:53 PM)    ipratropium (ATROVENT ) 0.03 % nasal spray [id number] Yes Place 2 sprays into both nostrils 2 (two) times daily as needed for rhinitis. [provider] Taking Active Care Giver           Med Note Guido Leeks, Lavonia Powers   Tue Jun 20, 2023  9:54 PM)    JANUVIA 50 MG tablet [id number] Yes Take 50 mg by mouth in the morning. [provider] Taking Active Care Giver  midodrine  (PROAMATINE ) 5 MG tablet [id number] Yes Take 1 tablet (5 mg total) by mouth 3 (three) times daily with meals.  Patient taking differently: Take 5 mg by mouth See admin instructions. Take 5 mg by mouth in the morning, at 2 PM, and bedtime   Leona Rake, MD  Taking Active Care Giver  MYRBETRIQ  50 MG TB24 tablet [id number] Yes Take 50 mg by mouth in the morning. [provider] Taking Active Care Giver  oxyCODONE  (ROXICODONE ) 5 MG immediate release tablet [id number] Yes Take 1 tablet (5 mg total) by mouth every 4 (four) hours as needed for severe pain (pain score 7-10). Gherghe, Costin M, MD Taking Active   simvastatin  (ZOCOR ) 20 MG tablet [id number] Yes Take 1 tablet (20 mg total) by mouth every evening. Do not take this medication until you finish Diflucan .  Patient taking differently: Take 20 mg by mouth at bedtime.   Lonita Roach, MD Taking Active Care Giver  Water For Irrigation, Sterile (STERILE WATER) [id number] Yes Irrigate with as directed See admin instructions. Mix sterile water with white vinegar 4:1 and flush indwelling catheter nightly before bedtime [provider] Taking Active Care Giver  XIGDUO XR 10-500 MG TB24 [id number] Yes Take 1 tablet by mouth daily before breakfast. [provider] Taking Active Care Giver            Recommendation:   PCP Follow-up Home Health requests: Occupational Therapy Physical therapy  Follow Up Plan:   Telephone follow up appointment date/time:  5/20 at 2pm  [facility], RN, MSN, CCM [facility], [facility] RN Care Coordinator Direct Dial: [phone] / Main [phone] Fax [phone] Email: [email or web address]

## 2023-08-01 NOTE — Patient Instructions (Signed)
 Visit Information  Thank you for taking time to visit with me today. Please don't hesitate to contact me if I can be of assistance to you before our next scheduled appointment.  Our next appointment is by telephone on 5/20 at 2pm  Please call the care guide team at [phone] if you need to cancel or reschedule your appointment.   Following is a copy of your care plan:   Goals Addressed             This Visit's Progress    VBCI RN Care Plan       Problems:  Chronic Disease Management support and education needs related to recent fall and humerus fracture  Goal: Over the next 45 days the Caregiver Patient will attend all scheduled medical appointments: ortho on 5/9, endocrinology on 5/28 as evidenced by documented completion of visits in EMR        continue to work with RN Care Manager and/or Social Worker to address care management and care coordination needs related to fall with fracture as evidenced by adherence to care management team scheduled appointments     demonstrate ongoing self health care management ability perform all ADLs independently as evidenced by     not experience hospital admission as evidenced by review of electronic medical record. Hospital Admissions in last 6 months = 2 take all medications exactly as prescribed and will call provider for medication related questions as evidenced by patient and caregiver reported compliance    work with home health PT to increase strength as evidenced by review of electronic medical record and patient or care team member report    Interventions:   Falls Interventions: Advised patient of importance of notifying provider of falls Assessed for falls since last encounter Provided patient information for fall alert systems Screening for signs and symptoms of depression related to chronic disease state  Assessed social determinant of health barriers  Patient Self-Care Activities:  Attend all scheduled provider  appointments Call provider office for new concerns or questions  Perform all self care activities independently  Perform IADL's (shopping, preparing meals, housekeeping, managing finances) independently Take medications as prescribed    Plan:  The patient has been provided with contact information for the care management team and has been advised to call with any health related questions or concerns.              Please call the Suicide and Crisis Lifeline: 988 call the [place]  National Suicide Prevention Lifeline: 463-021-2359 or TTY: (520)286-8918 TTY 858-398-4745) to talk to a trained counselor call 1-800-273-TALK (toll free, 24 hour hotline) call 911 if you are experiencing a Mental Health or Behavioral Health Crisis or need someone to talk to.  Patient verbalizes understanding of instructions and care plan provided today and agrees to view in MyChart. Active MyChart status and patient understanding of how to access instructions and care plan via MyChart confirmed with patient.     Holland Lundborg, RN, MSN, CCM [facility], [facility] RN Care Coordinator Direct Dial: [phone] / Main [phone] Fax [phone] Email: [email or web address]: [email or web address]

## 2023-08-02 DIAGNOSIS — S42292D Other displaced fracture of upper end of left humerus, subsequent encounter for fracture with routine healing: Secondary | ICD-10-CM | POA: Diagnosis not present

## 2023-08-02 DIAGNOSIS — E1122 Type 2 diabetes mellitus with diabetic chronic kidney disease: Secondary | ICD-10-CM | POA: Diagnosis not present

## 2023-08-02 DIAGNOSIS — I129 Hypertensive chronic kidney disease with stage 1 through stage 4 chronic kidney disease, or unspecified chronic kidney disease: Secondary | ICD-10-CM | POA: Diagnosis not present

## 2023-08-02 DIAGNOSIS — N182 Chronic kidney disease, stage 2 (mild): Secondary | ICD-10-CM | POA: Diagnosis not present

## 2023-08-02 DIAGNOSIS — N390 Urinary tract infection, site not specified: Secondary | ICD-10-CM | POA: Diagnosis not present

## 2023-08-02 DIAGNOSIS — A419 Sepsis, unspecified organism: Secondary | ICD-10-CM | POA: Diagnosis not present

## 2023-08-04 DIAGNOSIS — S42292D Other displaced fracture of upper end of left humerus, subsequent encounter for fracture with routine healing: Secondary | ICD-10-CM | POA: Diagnosis not present

## 2023-08-07 DIAGNOSIS — I129 Hypertensive chronic kidney disease with stage 1 through stage 4 chronic kidney disease, or unspecified chronic kidney disease: Secondary | ICD-10-CM | POA: Diagnosis not present

## 2023-08-07 DIAGNOSIS — N390 Urinary tract infection, site not specified: Secondary | ICD-10-CM | POA: Diagnosis not present

## 2023-08-07 DIAGNOSIS — A419 Sepsis, unspecified organism: Secondary | ICD-10-CM | POA: Diagnosis not present

## 2023-08-07 DIAGNOSIS — E1122 Type 2 diabetes mellitus with diabetic chronic kidney disease: Secondary | ICD-10-CM | POA: Diagnosis not present

## 2023-08-07 DIAGNOSIS — S42292D Other displaced fracture of upper end of left humerus, subsequent encounter for fracture with routine healing: Secondary | ICD-10-CM | POA: Diagnosis not present

## 2023-08-07 DIAGNOSIS — N182 Chronic kidney disease, stage 2 (mild): Secondary | ICD-10-CM | POA: Diagnosis not present

## 2023-08-09 DIAGNOSIS — E1122 Type 2 diabetes mellitus with diabetic chronic kidney disease: Secondary | ICD-10-CM | POA: Diagnosis not present

## 2023-08-09 DIAGNOSIS — S42292D Other displaced fracture of upper end of left humerus, subsequent encounter for fracture with routine healing: Secondary | ICD-10-CM | POA: Diagnosis not present

## 2023-08-09 DIAGNOSIS — I129 Hypertensive chronic kidney disease with stage 1 through stage 4 chronic kidney disease, or unspecified chronic kidney disease: Secondary | ICD-10-CM | POA: Diagnosis not present

## 2023-08-09 DIAGNOSIS — N3942 Incontinence without sensory awareness: Secondary | ICD-10-CM | POA: Diagnosis not present

## 2023-08-09 DIAGNOSIS — R102 Pelvic and perineal pain: Secondary | ICD-10-CM | POA: Diagnosis not present

## 2023-08-09 DIAGNOSIS — R338 Other retention of urine: Secondary | ICD-10-CM | POA: Diagnosis not present

## 2023-08-09 DIAGNOSIS — A419 Sepsis, unspecified organism: Secondary | ICD-10-CM | POA: Diagnosis not present

## 2023-08-09 DIAGNOSIS — N390 Urinary tract infection, site not specified: Secondary | ICD-10-CM | POA: Diagnosis not present

## 2023-08-09 DIAGNOSIS — N182 Chronic kidney disease, stage 2 (mild): Secondary | ICD-10-CM | POA: Diagnosis not present

## 2023-08-11 DIAGNOSIS — N182 Chronic kidney disease, stage 2 (mild): Secondary | ICD-10-CM | POA: Diagnosis not present

## 2023-08-11 DIAGNOSIS — I129 Hypertensive chronic kidney disease with stage 1 through stage 4 chronic kidney disease, or unspecified chronic kidney disease: Secondary | ICD-10-CM | POA: Diagnosis not present

## 2023-08-11 DIAGNOSIS — S42292D Other displaced fracture of upper end of left humerus, subsequent encounter for fracture with routine healing: Secondary | ICD-10-CM | POA: Diagnosis not present

## 2023-08-11 DIAGNOSIS — E1122 Type 2 diabetes mellitus with diabetic chronic kidney disease: Secondary | ICD-10-CM | POA: Diagnosis not present

## 2023-08-11 DIAGNOSIS — A419 Sepsis, unspecified organism: Secondary | ICD-10-CM | POA: Diagnosis not present

## 2023-08-11 DIAGNOSIS — N390 Urinary tract infection, site not specified: Secondary | ICD-10-CM | POA: Diagnosis not present

## 2023-08-14 ENCOUNTER — Other Ambulatory Visit (HOSPITAL_COMMUNITY): Payer: Self-pay | Admitting: Urology

## 2023-08-14 DIAGNOSIS — E1122 Type 2 diabetes mellitus with diabetic chronic kidney disease: Secondary | ICD-10-CM | POA: Diagnosis not present

## 2023-08-14 DIAGNOSIS — T83518A Infection and inflammatory reaction due to other urinary catheter, initial encounter: Secondary | ICD-10-CM | POA: Diagnosis not present

## 2023-08-14 DIAGNOSIS — A419 Sepsis, unspecified organism: Secondary | ICD-10-CM | POA: Diagnosis not present

## 2023-08-14 DIAGNOSIS — N390 Urinary tract infection, site not specified: Secondary | ICD-10-CM | POA: Diagnosis not present

## 2023-08-14 DIAGNOSIS — R410 Disorientation, unspecified: Secondary | ICD-10-CM | POA: Diagnosis not present

## 2023-08-14 DIAGNOSIS — R339 Retention of urine, unspecified: Secondary | ICD-10-CM

## 2023-08-14 DIAGNOSIS — N3942 Incontinence without sensory awareness: Secondary | ICD-10-CM

## 2023-08-14 DIAGNOSIS — S91302A Unspecified open wound, left foot, initial encounter: Secondary | ICD-10-CM | POA: Diagnosis not present

## 2023-08-14 DIAGNOSIS — N182 Chronic kidney disease, stage 2 (mild): Secondary | ICD-10-CM | POA: Diagnosis not present

## 2023-08-14 DIAGNOSIS — I129 Hypertensive chronic kidney disease with stage 1 through stage 4 chronic kidney disease, or unspecified chronic kidney disease: Secondary | ICD-10-CM | POA: Diagnosis not present

## 2023-08-14 DIAGNOSIS — R5383 Other fatigue: Secondary | ICD-10-CM | POA: Diagnosis not present

## 2023-08-14 DIAGNOSIS — E119 Type 2 diabetes mellitus without complications: Secondary | ICD-10-CM | POA: Diagnosis not present

## 2023-08-14 DIAGNOSIS — S42292D Other displaced fracture of upper end of left humerus, subsequent encounter for fracture with routine healing: Secondary | ICD-10-CM | POA: Diagnosis not present

## 2023-08-15 ENCOUNTER — Other Ambulatory Visit: Payer: Self-pay | Admitting: *Deleted

## 2023-08-15 NOTE — Patient Instructions (Signed)
 Visit Information  Thank you for taking time to visit with me today. Please don't hesitate to contact me if I can be of assistance to you before our next scheduled appointment.  Telephone follow up appointment date/time:  6/3 at 10am with P. Slusher  Please call the care guide team at [phone] if you need to cancel, schedule, or reschedule an appointment.   Please call the Suicide and Crisis Lifeline: 988 call the [place]  National Suicide Prevention Lifeline: (551) 260-0347 or TTY: 256-161-3972 TTY 281-177-2664) to talk to a trained counselor call 1-800-273-TALK (toll free, 24 hour hotline) call 911 if you are experiencing a Mental Health or Behavioral Health Crisis or need someone to talk to.  Holland Lundborg, RN, MSN, CCM [facility], [facility] Health RN Care Coordinator Direct Dial: [phone] / Main [phone] Fax [phone] Email: [email or web address]

## 2023-08-15 NOTE — Patient Outreach (Signed)
 Complex Care Management   Visit Note  08/15/2023  Name:  Dustin Duffy MRN: [id number] DOB: 08/29/1955  Situation: Referral received for Complex Care Management related to Diabetes with Complications and recurrent falls I obtained verbal consent from Caregiver.  Visit completed with Dustin Duffy, caregiver  on the phone  Per Dustin Duffy, patient has had another fall, and was seen in the urgent care yesterday diagnosed with a UTI, now on antibiotics.  Continues to have home health for nursing and PT.  She state he has lost weight, down to 108 pounds, and she is concerned of potential underlying issue.  She has asked provider to add labs to tomorrows blood draw to determine if he has some form of cancer.  Discussed ability to patient to stay in his home, she state she will continue to provide as much support as she can to keep him there as he wishes to die in his own home.  If needed, she will consider home physicians and/or hospice.   Background:   Past Medical History:  Diagnosis Date   BPH without urinary obstruction    Diabetes mellitus type 2 in nonobese (HCC)    Dyslipidemia    Essential hypertension    Renal impairment    Visual impairment     Assessment: Patient Reported Symptoms:  Cognitive Cognitive Status: Alert and oriented to person, place, and time Cognitive/Intellectual Conditions Management [RPT]: None reported or documented in medical history or problem list      Neurological Neurological Review of Symptoms: No symptoms reported    HEENT HEENT Symptoms Reported: No symptoms reported      Cardiovascular Cardiovascular Symptoms Reported: No symptoms reported    Respiratory Respiratory Symptoms Reported: No symptoms reported    Endocrine Patient reports the following symptoms related to hypoglycemia or hyperglycemia : No symptoms reported Is patient diabetic?: Yes Is patient checking blood sugars at home?: Yes Endocrine Conditions: Diabetes  Gastrointestinal Gastrointestinal  Symptoms Reported: No symptoms reported   Nutrition Risk Screen (CP): Unintentional loss of 10 lbs or more in the past 2 months  Genitourinary Genitourinary Symptoms Reported: Pain with urination Additional Genitourinary Details: Has chronic catheter, recently diagnosed with UTI, will have suprapubic catheter placed next month Genitourinary Conditions: Difficulty voiding Genitourinary Management Strategies: Catheter, indwelling Indwelling Catheter Inserted: 08/15/23 Genitourinary Self-Management Outcome: 3 (uncertain)  Integumentary Integumentary Symptoms Reported: Wound Additional Integumentary Details: Dustin Duffy report wound on heel ans skin tear on elbow    Musculoskeletal Musculoskelatal Symptoms Reviewed: Difficulty walking Additional Musculoskeletal Details: has had another fall since last outreach Musculoskeletal Management Strategies: Medical device Falls in the past year?: Yes Number of falls in past year: 2 or more Was there an injury with Fall?: Yes Fall Risk Category Calculator: 3 Patient Fall Risk Level: High Fall Risk Patient at Risk for Falls Due to: History of fall(s), Impaired balance/gait  Psychosocial Psychosocial Symptoms Reported: No symptoms reported            08/01/2023    3:19 PM  Depression screen PHQ 2/9  Decreased Interest 0  Down, Depressed, Hopeless 1  PHQ - 2 Score 1    There were no vitals filed for this visit.  Medications Reviewed Today     Reviewed by Holland Lundborg, RN (Registered Nurse) on 08/15/23 at 1627  Med List Status: <None>   Medication Order Taking? Sig Documenting Provider Last Dose Status Informant  acetaminophen  (TYLENOL ) 325 MG tablet [id number] Yes Take 2 tablets (650 mg total) by mouth every 6 (six) hours as  needed for mild pain (pain score 1-3) (or Fever >/= 101). Lorita Rosa, MD Taking Active Care Giver  amitriptyline  (ELAVIL ) 50 MG tablet [id number] Yes Take 50 mg by mouth at bedtime. [provider] Taking Active  Care Giver  aspirin  EC 81 MG tablet [id number] Yes Take 81 mg by mouth in the morning. Swallow whole. [provider] Taking Active Care Giver  cholecalciferol (VITAMIN D3) 25 MCG (1000 UNIT) tablet [id number] Yes Take 1,000 Units by mouth every Monday, Wednesday, and Friday. [provider] Taking Active Care Giver  fludrocortisone  (FLORINEF ) 0.1 MG tablet [id number] Yes Take 1 tablet (0.1 mg total) by mouth daily. Leona Rake, MD Taking Active Care Giver  gabapentin  (NEURONTIN ) 300 MG capsule [id number] Yes Take 900 mg by mouth at bedtime. [provider] Taking Active Care Giver           Med Note Guido Leeks, Lavonia Powers   Tue Jun 20, 2023  9:53 PM)    ipratropium (ATROVENT ) 0.03 % nasal spray [id number] Yes Place 2 sprays into both nostrils 2 (two) times daily as needed for rhinitis. [provider] Taking Active Care Giver           Med Note Guido Leeks, Lavonia Powers   Tue Jun 20, 2023  9:54 PM)    JANUVIA 50 MG tablet [id number] Yes Take 50 mg by mouth in the morning. [provider] Taking Active Care Giver  midodrine  (PROAMATINE ) 5 MG tablet [id number] Yes Take 1 tablet (5 mg total) by mouth 3 (three) times daily with meals.  Patient taking differently: Take 5 mg by mouth See admin instructions. Take 5 mg by mouth in the morning, at 2 PM, and bedtime   Leona Rake, MD Taking Active Care Giver  MYRBETRIQ  50 MG TB24 tablet [id number] Yes Take 50 mg by mouth in the morning. [provider] Taking Active Care Giver  oxyCODONE  (ROXICODONE ) 5 MG immediate release tablet [id number] Yes Take 1 tablet (5 mg total) by mouth every 4 (four) hours as needed for severe pain (pain score 7-10). Gherghe, Costin M, MD Taking Active   simvastatin  (ZOCOR ) 20 MG tablet [id number] Yes Take 1 tablet (20 mg total) by mouth every evening. Do not take this medication until you finish Diflucan .  Patient taking differently: Take 20 mg by mouth at bedtime.   Lonita Roach, MD Taking  Active Care Giver  Water For Irrigation, Sterile (STERILE WATER) [id number] Yes Irrigate with as directed See admin instructions. Mix sterile water with white vinegar 4:1 and flush indwelling catheter nightly before bedtime [provider] Taking Active Care Giver  XIGDUO XR 10-500 MG TB24 [id number] Yes Take 1 tablet by mouth daily before breakfast. [provider] Taking Active Care Giver            Recommendation:   PCP Follow-up Specialty provider follow-up endocrinology labs tomorrow and office visit on 5/28  Follow Up Plan:   Telephone follow up appointment date/time:  6/3 at 10am with P. Slusher, RNCM  Holland Lundborg, RN, MSN, CCM [facility], [facility] RN Care Coordinator Direct Dial: [phone] / Main [phone] Fax [phone] Email: [email or web address]: [email or web address]

## 2023-08-16 DIAGNOSIS — E1122 Type 2 diabetes mellitus with diabetic chronic kidney disease: Secondary | ICD-10-CM | POA: Diagnosis not present

## 2023-08-17 DIAGNOSIS — R627 Adult failure to thrive: Secondary | ICD-10-CM | POA: Diagnosis not present

## 2023-08-17 DIAGNOSIS — N401 Enlarged prostate with lower urinary tract symptoms: Secondary | ICD-10-CM | POA: Diagnosis not present

## 2023-08-17 DIAGNOSIS — E1122 Type 2 diabetes mellitus with diabetic chronic kidney disease: Secondary | ICD-10-CM | POA: Diagnosis not present

## 2023-08-17 DIAGNOSIS — E1142 Type 2 diabetes mellitus with diabetic polyneuropathy: Secondary | ICD-10-CM | POA: Diagnosis not present

## 2023-08-17 DIAGNOSIS — R2681 Unsteadiness on feet: Secondary | ICD-10-CM | POA: Diagnosis not present

## 2023-08-17 DIAGNOSIS — Z978 Presence of other specified devices: Secondary | ICD-10-CM | POA: Diagnosis not present

## 2023-08-17 DIAGNOSIS — Z794 Long term (current) use of insulin: Secondary | ICD-10-CM | POA: Diagnosis not present

## 2023-08-17 DIAGNOSIS — R2689 Other abnormalities of gait and mobility: Secondary | ICD-10-CM | POA: Diagnosis not present

## 2023-08-17 DIAGNOSIS — E1140 Type 2 diabetes mellitus with diabetic neuropathy, unspecified: Secondary | ICD-10-CM | POA: Diagnosis not present

## 2023-08-17 DIAGNOSIS — H4720 Unspecified optic atrophy: Secondary | ICD-10-CM | POA: Diagnosis not present

## 2023-08-17 DIAGNOSIS — L8990 Pressure ulcer of unspecified site, unspecified stage: Secondary | ICD-10-CM | POA: Diagnosis not present

## 2023-08-17 DIAGNOSIS — G5711 Meralgia paresthetica, right lower limb: Secondary | ICD-10-CM | POA: Diagnosis not present

## 2023-08-18 ENCOUNTER — Telehealth: Payer: Self-pay

## 2023-08-18 ENCOUNTER — Telehealth: Payer: Self-pay | Admitting: *Deleted

## 2023-08-18 DIAGNOSIS — R296 Repeated falls: Secondary | ICD-10-CM

## 2023-08-18 DIAGNOSIS — I129 Hypertensive chronic kidney disease with stage 1 through stage 4 chronic kidney disease, or unspecified chronic kidney disease: Secondary | ICD-10-CM | POA: Diagnosis not present

## 2023-08-18 DIAGNOSIS — N390 Urinary tract infection, site not specified: Secondary | ICD-10-CM

## 2023-08-18 DIAGNOSIS — E1122 Type 2 diabetes mellitus with diabetic chronic kidney disease: Secondary | ICD-10-CM | POA: Diagnosis not present

## 2023-08-18 DIAGNOSIS — A419 Sepsis, unspecified organism: Secondary | ICD-10-CM | POA: Diagnosis not present

## 2023-08-18 DIAGNOSIS — N182 Chronic kidney disease, stage 2 (mild): Secondary | ICD-10-CM | POA: Diagnosis not present

## 2023-08-18 DIAGNOSIS — S42292D Other displaced fracture of upper end of left humerus, subsequent encounter for fracture with routine healing: Secondary | ICD-10-CM | POA: Diagnosis not present

## 2023-08-18 NOTE — Patient Outreach (Signed)
 Notified by CMA that patient has new referral received from PCP office to LCSW for placement.  Call placed to caregiver, Dustin Duffy, she state after our conversation the on 5/20, she went over to help him change his heel dressing and bathe.  She noticed at that time that he had additional wounds (heels and coccyx) that needed attention.  She scheduled appointment with PCP and asked for more help.  Patient still would like to stay in the home.  He does not have Medicaid and the personal care coverage would be out of pocket, [name] aware and state patient is willing to pay.  Patient currently has aide/nurse services for wound care through [facility].  List of private pay agencies for 24/7 care emailed to [place].  Newly assigned RNCM and LCSW both updated.   Holland Lundborg, RN, MSN, CCM [facility], [facility] RN Care Coordinator Direct Dial: [phone] / Main [phone] Fax [phone] Email: [email or web address]: [email or web address]

## 2023-08-23 ENCOUNTER — Other Ambulatory Visit: Payer: Self-pay | Admitting: *Deleted

## 2023-08-23 ENCOUNTER — Telehealth: Payer: Self-pay | Admitting: *Deleted

## 2023-08-23 NOTE — Patient Instructions (Signed)
 Visit Information  Thank you for taking time to visit with me today. Please don't hesitate to contact me if I can be of assistance to you before our next scheduled appointment.  Telephone follow up appointment date/time:  6/3 at 10am with P. Slusher  Please call the care guide team at [phone] if you need to cancel, schedule, or reschedule an appointment.   Please call the Suicide and Crisis Lifeline: 988 call the [place]  National Suicide Prevention Lifeline: (717)257-1766 or TTY: 906-791-2359 TTY 901-603-2502) to talk to a trained counselor call 1-800-273-TALK (toll free, 24 hour hotline) go to [facility] [address], [place] [phone]) call 911 if you are experiencing a Mental Health or Behavioral Health Crisis or need someone to talk to.  Holland Lundborg, RN, MSN, CCM [facility], [facility] RN Care Coordinator Direct Dial: [phone] / Main [phone] Fax [phone] Email: [email or web address] Website: [email or web address]

## 2023-08-23 NOTE — Patient Outreach (Addendum)
 Complex Care Management   Visit Note  08/23/2023  Name:  Dustin Duffy MRN: [id number] DOB: [birth year]  Situation: Referral received for Complex Care Management related to Diabetes with Complications and falls I obtained verbal consent from Guardian.  Visit completed with [name], caregiver  on the phone  Spoke with Odilia Bennett with [facility], unfortunately they are unable to open patient's case due to the severity of his condition and the need for higher level of care.  He is not adequately able to care for himself even with the support of [name].  Per [name], patient is adamant about staying in the home, list of private pay agencies sent to [name], which was forwarded to sister Adah Acron, reportedly sister does not feel patient is able to pay out of pocket.  He was recently in SNF at [facility], paid medicare days were used, and patient may need to pay out of pocket if he is placed for short term care.    Call placed to Adah Acron to discuss patient safety concerns directly as she is patient's POA, state she is on the way out to MD appointment and will call later this afternoon to discuss options and plan of care.   UPDATE: Call received back from sister.  She verbalizes understanding regarding the severity of patient's condition and the need for a higher level of care, but also state she would like to honer patient's wishes regarding decision for plan of care.  Advised that [facility] was not able to open case due to needing higher level of care.  Also advised that he has used his Medicare days and would have to pay out of pocket if he decides to go to facility.  He has not been willing to do this, nor has he been willing to pay out of pocket to have someone come into the home.  She will continue to talk to him about options over the next couple days.  This RNCM will follow up with her on Friday morning per her request.  PCP team updated.   Background:   Past Medical History:  Diagnosis Date   BPH without urinary  obstruction    Diabetes mellitus type 2 in nonobese (HCC)    Dyslipidemia    Essential hypertension    Renal impairment    Visual impairment     Assessment: Patient Reported Symptoms:  Cognitive Cognitive Status: Alert and oriented to person, place, and time, Normal speech and language skills, Requires Assistance Decision Making Cognitive/Intellectual Conditions Management [RPT]: None reported or documented in medical history or problem list      Neurological Neurological Review of Symptoms: No symptoms reported    HEENT HEENT Symptoms Reported: No symptoms reported      Cardiovascular Cardiovascular Symptoms Reported: No symptoms reported    Respiratory Respiratory Symptoms Reported: No symptoms reported    Endocrine Patient reports the following symptoms related to hypoglycemia or hyperglycemia : No symptoms reported    Gastrointestinal Gastrointestinal Symptoms Reported: No symptoms reported      Genitourinary Genitourinary Symptoms Reported: Pain with urination Additional Genitourinary Details: Remains on schedule for suprapubic catheter    Integumentary Integumentary Symptoms Reported: Wound Additional Integumentary Details: Wound on both heels and coccyx Skin Conditions: Wound  Musculoskeletal Musculoskelatal Symptoms Reviewed: Difficulty walking Additional Musculoskeletal Details: No additional falls reported in the last week        Psychosocial Psychosocial Symptoms Reported: No symptoms reported            08/01/2023    3:19  PM  Depression screen PHQ 2/9  Decreased Interest 0  Down, Depressed, Hopeless 1  PHQ - 2 Score 1    There were no vitals filed for this visit.  Medications Reviewed Today     Reviewed by Holland Lundborg, RN (Registered Nurse) on 08/23/23 at 1121  Med List Status: <None>   Medication Order Taking? Sig Documenting Provider Last Dose Status Informant  acetaminophen  (TYLENOL ) 325 MG tablet [id number] No Take 2 tablets (650 mg  total) by mouth every 6 (six) hours as needed for mild pain (pain score 1-3) (or Fever >/= 101). Lorita Rosa, MD Taking Active Care Giver  amitriptyline  (ELAVIL ) 50 MG tablet [id number] No Take 50 mg by mouth at bedtime. [provider] Taking Active Care Giver  aspirin  EC 81 MG tablet [id number] No Take 81 mg by mouth in the morning. Swallow whole. [provider] Taking Active Care Giver  cholecalciferol (VITAMIN D3) 25 MCG (1000 UNIT) tablet [id number] No Take 1,000 Units by mouth every Monday, Wednesday, and Friday. [provider] Taking Active Care Giver  fludrocortisone  (FLORINEF ) 0.1 MG tablet [id number] No Take 1 tablet (0.1 mg total) by mouth daily. Leona Rake, MD Taking Active Care Giver  gabapentin  (NEURONTIN ) 300 MG capsule [id number] No Take 900 mg by mouth at bedtime. [provider] Taking Active Care Giver           Med Note Guido Leeks, Lavonia Powers   Tue Jun 20, 2023  9:53 PM)    ipratropium (ATROVENT ) 0.03 % nasal spray [id number] No Place 2 sprays into both nostrils 2 (two) times daily as needed for rhinitis. [provider] Taking Active Care Giver           Med Note Guido Leeks, Lavonia Powers   Tue Jun 20, 2023  9:54 PM)    JANUVIA 50 MG tablet [id number] No Take 50 mg by mouth in the morning. [provider] Taking Active Care Giver  midodrine  (PROAMATINE ) 5 MG tablet [id number] No Take 1 tablet (5 mg total) by mouth 3 (three) times daily with meals.  Patient taking differently: Take 5 mg by mouth See admin instructions. Take 5 mg by mouth in the morning, at 2 PM, and bedtime   Leona Rake, MD Taking Active Care Giver  MYRBETRIQ  50 MG TB24 tablet [id number] No Take 50 mg by mouth in the morning. [provider] Taking Active Care Giver  oxyCODONE  (ROXICODONE ) 5 MG immediate release tablet [id number] No Take 1 tablet (5 mg total) by mouth every 4 (four) hours as needed for severe pain (pain score 7-10). Gherghe, Costin M, MD Taking  Active   simvastatin  (ZOCOR ) 20 MG tablet [id number] No Take 1 tablet (20 mg total) by mouth every evening. Do not take this medication until you finish Diflucan .  Patient taking differently: Take 20 mg by mouth at bedtime.   Lonita Roach, MD Taking Active Care Giver  Water For Irrigation, Sterile (STERILE WATER) [id number] No Irrigate with as directed See admin instructions. Mix sterile water with white vinegar 4:1 and flush indwelling catheter nightly before bedtime [provider] Taking Active Care Giver  XIGDUO XR 10-500 MG TB24 [id number] No Take 1 tablet by mouth daily before breakfast. [provider] Taking Active Care Giver            Recommendation:   PCP Follow-up Collaborate with family regarding placement or in home assistance  Follow Up Plan:   Telephone follow up appointment date/time:  6/3 at 10am If sister  does not call back today, will follow up with her tomorrow morning.   Holland Lundborg, RN, MSN, CCM [facility], [facility] RN Care Coordinator Direct Dial: [phone] / Main [phone] Fax [phone] Email: [email or web address]: [email or web address]

## 2023-08-23 NOTE — Progress Notes (Unsigned)
 Complex Care Management Note Care Guide Note  08/23/2023 Name: Dustin Duffy MRN: [id number] DOB: April 02, 1955   Complex Care Management Outreach Attempts: An unsuccessful telephone outreach was attempted today to offer the patient information about available complex care management services.  Follow Up Plan:  Additional outreach attempts will be made to offer the patient complex care management information and services.   Encounter Outcome:  No Answer  Kandis Ormond, CMA [place]  [facility], [facility] Guide Direct Dial: [phone]  Fax: [phone] Website: [email or web address]

## 2023-08-24 NOTE — Progress Notes (Unsigned)
 Complex Care Management Note Care Guide Note  08/24/2023 Name: Dustin Duffy MRN: [id number] DOB: 04-13-1955   Complex Care Management Outreach Attempts: A second unsuccessful outreach was attempted today to offer the patient with information about available complex care management services.  Follow Up Plan:  No further outreach attempts will be made at this time. We have been unable to contact the patient to offer or enroll patient in complex care management services.  Encounter Outcome:  No Answer  Kandis Ormond, CMA [place]  [facility], [facility] Guide Direct Dial: [phone]  Fax: [phone] Website: [email or web address]

## 2023-08-25 ENCOUNTER — Telehealth: Payer: Self-pay | Admitting: *Deleted

## 2023-08-25 NOTE — Patient Outreach (Addendum)
 Call placed to sister Adah Acron, state patient declined placement, but open to having someone come to the home.  He is not open to 24/7 out of pocket paid care, but she state he is willing to pay for a few days a week.  Confirmed she still has list of private pay agencies, and offered to assist with set up.  She declined, state she also talked to a nurse from [facility] and they are trying to work something out to be able to accept patient.  RNCM and LCSW will follow up with caregiver and/or sister next week.   Update at 1020: Call received back from [facility] stating patient has just been visited by hospice nurse and he is considering becoming active with them.  [facility]BCI team will follow up next week to confirm.   Holland Lundborg, RN, MSN, CCM [facility], [facility] RN Care Coordinator Direct Dial: [phone] / Main [phone] Fax [phone] Email: [email or web address]

## 2023-08-25 NOTE — Progress Notes (Signed)
 Pt scheduled with LCSW by RN

## 2023-08-29 ENCOUNTER — Other Ambulatory Visit: Payer: Self-pay | Admitting: Licensed Clinical Social Worker

## 2023-08-29 ENCOUNTER — Other Ambulatory Visit: Payer: Self-pay

## 2023-08-29 NOTE — Patient Outreach (Signed)
 Complex Care Management   Visit Note  08/29/2023  Name:  Dustin Duffy MRN: [id number] DOB: February 11, 1956  Situation: Referral received for Complex Care Management related to Diabetes with Complications and Hx of falls I obtained verbal consent from POA.  Visit completed with POA and pt's Caregiver, Dustin Duffy  on the phone  Background:   Past Medical History:  Diagnosis Date   BPH without urinary obstruction    Diabetes mellitus type 2 in nonobese (HCC)    Dyslipidemia    Essential hypertension    Renal impairment    Visual impairment     Assessment: Patient Reported Symptoms:  Cognitive Cognitive Status: Alert and oriented to person, place, and time, Normal speech and language skills, Requires Assistance Decision Making Cognitive/Intellectual Conditions Management [RPT]: None reported or documented in medical history or problem list      Neurological Neurological Review of Symptoms: No symptoms reported    HEENT HEENT Symptoms Reported: No symptoms reported HEENT Conditions: Vision problem(s) Vision Problems: blindness/vision loss HEENT Management Strategies: Routine screening Vision problem(s)  Cardiovascular Cardiovascular Symptoms Reported: No symptoms reported Does patient have uncontrolled Hypertension?: No Cardiovascular Conditions: High blood cholesterol Cardiovascular Management Strategies: Routine screening  Respiratory Respiratory Symptoms Reported: No symptoms reported    Endocrine Patient reports the following symptoms related to hypoglycemia or hyperglycemia : No symptoms reported Is patient diabetic?: Yes Is patient checking blood sugars at home?: Yes Endocrine Conditions: Diabetes Endocrine Management Strategies: Diet modification, Routine screening, Medication therapy  Gastrointestinal Gastrointestinal Symptoms Reported: No symptoms reported      Genitourinary Genitourinary Symptoms Reported: Not assessed    Integumentary Integumentary Symptoms Reported:  Wound Additional Integumentary Details: Dustin Duffy reports pt has wounds on bottom and both heels Skin Conditions: Wound Skin Management Strategies: Dressing changes  Musculoskeletal Musculoskelatal Symptoms Reviewed: Difficulty walking Musculoskeletal Conditions: Fracture Musculoskeletal Management Strategies: Medical device      Psychosocial Psychosocial Symptoms Reported: Depression - if selected complete PHQ 2-9 Additional Psychological Details: Dustin Duffy reports pt exhibits bouts of depression at times due to stress managing chronic health conditions and limited mobility. Healthy coping skills encouraged and pt receives strong support from family/friends Behavioral Health Conditions: Other Other Behavorial Health Conditions: Stress Behavioral Management Strategies: Coping strategies, Medication therapy Major Change/Loss/Stressor/Fears (CP): Medical condition, self Quality of Family Relationships: helpful, involved, supportive Do you feel physically threatened by others?: No      08/01/2023    3:19 PM  Depression screen PHQ 2/9  Decreased Interest 0  Down, Depressed, Hopeless 1  PHQ - 2 Score 1    There were no vitals filed for this visit.  Medications Reviewed Today     Reviewed by Adriana Albany, LCSW (Social Worker) on 08/29/23 at 1145  Med List Status: <None>   Medication Order Taking? Sig Documenting Provider Last Dose Status Informant  acetaminophen  (TYLENOL ) 325 MG tablet [id number] Yes Take 2 tablets (650 mg total) by mouth every 6 (six) hours as needed for mild pain (pain score 1-3) (or Fever >/= 101). Lorita Rosa, MD Taking Active Care Giver  amitriptyline  (ELAVIL ) 50 MG tablet [id number] Yes Take 50 mg by mouth at bedtime. [provider] Taking Active Care Giver  aspirin  EC 81 MG tablet [id number] Yes Take 81 mg by mouth in the morning. Swallow whole. [provider] Taking Active Care Giver  cholecalciferol (VITAMIN D3) 25 MCG (1000 UNIT) tablet  [id number] Yes Take 1,000 Units by mouth every Monday, Wednesday, and Friday. [provider] Taking Active Care Giver  fludrocortisone  (FLORINEF ) 0.1 MG tablet [id number] Yes Take 1 tablet (0.1 mg total) by mouth daily. Leona Rake, MD Taking Active Care Giver  gabapentin  (NEURONTIN ) 300 MG capsule [id number] Yes Take 900 mg by mouth at bedtime. [provider] Taking Active Care Giver           Med Note Guido Leeks, Lavonia Powers   Tue Jun 20, 2023  9:53 PM)    ipratropium (ATROVENT ) 0.03 % nasal spray [id number] Yes Place 2 sprays into both nostrils 2 (two) times daily as needed for rhinitis. [provider] Taking Active Care Giver           Med Note Guido Leeks, Lavonia Powers   Tue Jun 20, 2023  9:54 PM)    JANUVIA 50 MG tablet [id number] Yes Take 50 mg by mouth in the morning. [provider] Taking Active Care Giver  midodrine  (PROAMATINE ) 5 MG tablet [id number] Yes Take 1 tablet (5 mg total) by mouth 3 (three) times daily with meals.  Patient taking differently: Take 5 mg by mouth See admin instructions. Take 5 mg by mouth in the morning, at 2 PM, and bedtime   Leona Rake, MD Taking Active Care Giver  MYRBETRIQ  50 MG TB24 tablet [id number] Yes Take 50 mg by mouth in the morning. [provider] Taking Active Care Giver  oxyCODONE  (ROXICODONE ) 5 MG immediate release tablet [id number] Yes Take 1 tablet (5 mg total) by mouth every 4 (four) hours as needed for severe pain (pain score 7-10). Gherghe, Costin M, MD Taking Active   simvastatin  (ZOCOR ) 20 MG tablet [id number] Yes Take 1 tablet (20 mg total) by mouth every evening. Do not take this medication until you finish Diflucan .  Patient taking differently: Take 20 mg by mouth at bedtime.   Lonita Roach, MD Taking Active Care Giver  Water For Irrigation, Sterile (STERILE WATER) [id number] Yes Irrigate with as directed See admin instructions. Mix sterile water with white vinegar 4:1 and flush indwelling catheter nightly  before bedtime [provider] Taking Active Care Giver  XIGDUO XR 10-500 MG TB24 [id number] Yes Take 1 tablet by mouth daily before breakfast. [provider] Taking Active Care Giver            Recommendation:   Continue Current Plan of Care  Follow Up Plan:   Telephone follow-up 2-4 weeks  Alease Hunter, LCSW [facility]  [facility], [facility] Clinical Social Worker Direct Dial: [phone]  Fax: [phone] Website: [email or web address] 12:13 PM

## 2023-08-29 NOTE — Patient Outreach (Signed)
 Complex Care Management   Visit Note  08/29/2023  Name:  Dustin Duffy MRN: [id number] DOB: 09-06-1955  Situation: Referral received for Complex Care Management related to Diabetes with Complications and Falls I obtained verbal consent from Caregiver.  Visit completed with Dustin Duffy/caregiver  on the phone.  Per Dustin Duffy, patient lives alone, unable to care for himself, can transfer from bed to wheelchair but unable to ambulate on his own, has indwelling catheter, wounds to coccyx and both heels that need dressing changes, relies on Dustin Duffy/friend and Dustin Duffy/sister, for assistance.  He has agreed to receive in-home aide services two days a week, four hours a day for bathing assist, light-housekeeping.  They are looking to hire a nurse for dressing changes when Dustin Duffy isn't able to visit.  He declines going to SNF.   Background:   Past Medical History:  Diagnosis Date   BPH without urinary obstruction    Diabetes mellitus type 2 in nonobese (HCC)    Dyslipidemia    Essential hypertension    Renal impairment    Visual impairment     Assessment: Patient Reported Symptoms:  Cognitive Cognitive Status: Alert and oriented to person, place, and time, Normal speech and language skills, Requires Assistance Decision Making      Neurological Neurological Review of Symptoms: Not assessed    HEENT HEENT Symptoms Reported: No symptoms reported Vision Problems: blindness/vision loss    Cardiovascular Cardiovascular Symptoms Reported: No symptoms reported    Respiratory Respiratory Symptoms Reported: No symptoms reported    Endocrine Patient reports the following symptoms related to hypoglycemia or hyperglycemia : Not assessed    Gastrointestinal Gastrointestinal Symptoms Reported: Not assessed      Genitourinary Genitourinary Symptoms Reported: Other Other Genitourinary Symptoms: Dustin Duffy reports catheter leaks frequently during the night. Genitourinary Conditions: Difficulty voiding Genitourinary  Management Strategies: Catheter, indwelling  Integumentary Integumentary Symptoms Reported: Wound Additional Integumentary Details: Wound on both heels and coccyx Skin Management Strategies: Dressing changes  Musculoskeletal Musculoskelatal Symptoms Reviewed: Difficulty walking        Psychosocial Psychosocial Symptoms Reported: No symptoms reported     Quality of Family Relationships: involved, supportive, helpful      08/01/2023    3:19 PM  Depression screen PHQ 2/9  Decreased Interest 0  Down, Depressed, Hopeless 1  PHQ - 2 Score 1    There were no vitals filed for this visit.  Medications Reviewed Today   Medications were not reviewed in this encounter     Recommendation:   -Family/caregiver will continue discussion with patient to receive aide assistance more often than two days a week. Family/caregiver is looking into hiring a nurse to perform dressing changes when caregiver isn't able to visit.   Follow Up Plan:   Telephone follow-up two weeks  Dustin Duffy BSN, CCM Dustin Duffy  [facility] Population Health RN Care Manager Direct Dial: [phone]  Fax: [phone]

## 2023-08-29 NOTE — Patient Instructions (Signed)
 Visit Information  Thank you for taking time to visit with me today. Please don't hesitate to contact me if I can be of assistance to you before our next scheduled appointment.  Your next care management appointment is by telephone on Tuesday, June 17th at 10:45am.    Please call the care guide team at [phone] if you need to cancel, schedule, or reschedule an appointment.   A reminder to ALL patients/family/friends, please call the [place]  National Suicide Prevention Lifeline: 214-564-4347 or TTY: 639-086-7074 TTY 567-794-9429) to talk to a trained counselor if you are experiencing a Mental Health or Behavioral Health Crisis or need someone to talk to.  Jurline Olmsted BSN, CCM [place]  [facility] Population Health RN Care Manager Direct Dial: [phone]  Fax: [phone]

## 2023-08-29 NOTE — Patient Instructions (Signed)
 Visit Information  Thank you for taking time to visit with me today. Please don't hesitate to contact me if I can be of assistance to you before our next scheduled appointment.  Our next appointment is by telephone on 06/24 at 11 AM Please call the care guide team at [phone] if you need to cancel or reschedule your appointment.   Following is a copy of your care plan:   Goals Addressed             This Visit's Progress    LCSW [facility]BCI Social Work Care Plan   On track    Problems:   Level of Care Concerns:Inability to perform ADL's independently Inability to perform IADL's independently  CSW Clinical Goal(s):   Over the next 60 days the Patient will attend all scheduled medical appointments as evidenced by patient report and care team review of appointment completion in electronic MEDICAL RECORD NUMBERMyChart work with Social Worker to address concerns related to Level of Care.  Interventions:  Level of Care Concerns in a patient with [name] and Hx of Falls Current level of care: home, alone Evaluation of patient's unmet needs in current living environment ADL's Assessed needs, level of care concerns, how currently meeting needs and barriers to care Discussed family support and building support system : Pt receives strong support from sister, Adah Acron who is POA and friend, [name] Discussed private pay options for personal care needs (Pt and [name] has reached out to [employer], who will visit pt on Friday, June 6 to discuss personal care agencies)  Facility  Patient is not interested in placement at this time  Advance Directive Pt's sister is POA   Patient Goals/Self-Care Activities:  Continue taking your medication as prescribed.   Coordinate with PCP to assist with advisement on medications to assist with symptom management. [name] will discuss prescription of Cymbalta with PCP. Increase coping skills and healthy habits Patient met with hospice; however, is not interested in  establishing with them at this time  Plan:   Telephone follow up appointment with care management team member scheduled for:  2-3 weeks        Please call the Suicide and Crisis Lifeline: 988 go to [facility] [address] [place] [phone]) call 911 if you are experiencing a Mental Health or Behavioral Health Crisis or need someone to talk to.  Patient verbalizes understanding of instructions and care plan provided today and agrees to view in MyChart. Active MyChart status and patient understanding of how to access instructions and care plan via MyChart confirmed with patient.     Arlis Bent [facility]  [facility], [facility] Clinical Social Worker Direct Dial: [phone]  Fax: [phone] Website: [email or web address] 12:13 PM

## 2023-09-01 DIAGNOSIS — S42292D Other displaced fracture of upper end of left humerus, subsequent encounter for fracture with routine healing: Secondary | ICD-10-CM | POA: Diagnosis not present

## 2023-09-05 ENCOUNTER — Ambulatory Visit (HOSPITAL_COMMUNITY)

## 2023-09-05 DIAGNOSIS — D631 Anemia in chronic kidney disease: Secondary | ICD-10-CM | POA: Diagnosis not present

## 2023-09-05 DIAGNOSIS — J9811 Atelectasis: Secondary | ICD-10-CM | POA: Diagnosis not present

## 2023-09-05 DIAGNOSIS — Z7984 Long term (current) use of oral hypoglycemic drugs: Secondary | ICD-10-CM | POA: Diagnosis not present

## 2023-09-05 DIAGNOSIS — Z7982 Long term (current) use of aspirin: Secondary | ICD-10-CM | POA: Diagnosis not present

## 2023-09-05 DIAGNOSIS — Z604 Social exclusion and rejection: Secondary | ICD-10-CM | POA: Diagnosis not present

## 2023-09-05 DIAGNOSIS — R296 Repeated falls: Secondary | ICD-10-CM | POA: Diagnosis not present

## 2023-09-05 DIAGNOSIS — S42202D Unspecified fracture of upper end of left humerus, subsequent encounter for fracture with routine healing: Secondary | ICD-10-CM | POA: Diagnosis not present

## 2023-09-05 DIAGNOSIS — Z7952 Long term (current) use of systemic steroids: Secondary | ICD-10-CM | POA: Diagnosis not present

## 2023-09-05 DIAGNOSIS — I700 Atherosclerosis of aorta: Secondary | ICD-10-CM | POA: Diagnosis not present

## 2023-09-05 DIAGNOSIS — N138 Other obstructive and reflux uropathy: Secondary | ICD-10-CM | POA: Diagnosis not present

## 2023-09-05 DIAGNOSIS — F32A Depression, unspecified: Secondary | ICD-10-CM | POA: Diagnosis not present

## 2023-09-05 DIAGNOSIS — Z794 Long term (current) use of insulin: Secondary | ICD-10-CM | POA: Diagnosis not present

## 2023-09-05 DIAGNOSIS — E1122 Type 2 diabetes mellitus with diabetic chronic kidney disease: Secondary | ICD-10-CM | POA: Diagnosis not present

## 2023-09-05 DIAGNOSIS — I129 Hypertensive chronic kidney disease with stage 1 through stage 4 chronic kidney disease, or unspecified chronic kidney disease: Secondary | ICD-10-CM | POA: Diagnosis not present

## 2023-09-05 DIAGNOSIS — Z87440 Personal history of urinary (tract) infections: Secondary | ICD-10-CM | POA: Diagnosis not present

## 2023-09-05 DIAGNOSIS — N401 Enlarged prostate with lower urinary tract symptoms: Secondary | ICD-10-CM | POA: Diagnosis not present

## 2023-09-05 DIAGNOSIS — I2510 Atherosclerotic heart disease of native coronary artery without angina pectoris: Secondary | ICD-10-CM | POA: Diagnosis not present

## 2023-09-05 DIAGNOSIS — N182 Chronic kidney disease, stage 2 (mild): Secondary | ICD-10-CM | POA: Diagnosis not present

## 2023-09-05 DIAGNOSIS — H548 Legal blindness, as defined in USA: Secondary | ICD-10-CM | POA: Diagnosis not present

## 2023-09-05 DIAGNOSIS — Z993 Dependence on wheelchair: Secondary | ICD-10-CM | POA: Diagnosis not present

## 2023-09-05 DIAGNOSIS — Z9181 History of falling: Secondary | ICD-10-CM | POA: Diagnosis not present

## 2023-09-07 DIAGNOSIS — N182 Chronic kidney disease, stage 2 (mild): Secondary | ICD-10-CM | POA: Diagnosis not present

## 2023-09-07 DIAGNOSIS — E1122 Type 2 diabetes mellitus with diabetic chronic kidney disease: Secondary | ICD-10-CM | POA: Diagnosis not present

## 2023-09-07 DIAGNOSIS — S42202D Unspecified fracture of upper end of left humerus, subsequent encounter for fracture with routine healing: Secondary | ICD-10-CM | POA: Diagnosis not present

## 2023-09-07 DIAGNOSIS — I700 Atherosclerosis of aorta: Secondary | ICD-10-CM | POA: Diagnosis not present

## 2023-09-07 DIAGNOSIS — I129 Hypertensive chronic kidney disease with stage 1 through stage 4 chronic kidney disease, or unspecified chronic kidney disease: Secondary | ICD-10-CM | POA: Diagnosis not present

## 2023-09-07 DIAGNOSIS — Z794 Long term (current) use of insulin: Secondary | ICD-10-CM | POA: Diagnosis not present

## 2023-09-12 ENCOUNTER — Other Ambulatory Visit: Payer: Self-pay | Admitting: Student

## 2023-09-12 ENCOUNTER — Other Ambulatory Visit: Payer: Self-pay

## 2023-09-12 DIAGNOSIS — N138 Other obstructive and reflux uropathy: Secondary | ICD-10-CM

## 2023-09-12 NOTE — Patient Instructions (Signed)
 Visit Information  Thank you for taking time to visit with me today. Please don't hesitate to contact me if I can be of assistance to you before our next scheduled appointment.  Your next care management appointment is by telephone on Friday, June 20th at 11:45am for check in.    Please call the care guide team at [phone] if you need to cancel, schedule, or reschedule an appointment.   A reminder to ALL patients/family/friends, please call the [place]  National Suicide Prevention Lifeline: 860-032-3529 or TTY: 365-782-5504 TTY (845)221-5066) to talk to a trained counselor if you are experiencing a Mental Health or Behavioral Health Crisis or needs.  Jurline Olmsted BSN, CCM [place]  [facility] Population Health RN Care Manager Direct Dial: [phone]  Fax: [phone]

## 2023-09-12 NOTE — Patient Outreach (Signed)
 Complex Care Management   Visit Note  09/12/2023  Name:  Dustin Duffy MRN: [id number] DOB: [birth year]  Situation: Referral received for Complex Care Management related to Diabetes and Falls I obtained verbal consent from Caregiver.  Visit completed with caregiver, Dustin Duffy  on the phone.  Dustin Duffy is now getting personal care services three days a week for assistance with bathing, light housekeeping, companionship. He is scheduled for Suprapubic catheter placement 09/13/23.  Family will be meeting with Hospice for evaluation in the upcoming weeks. Had to keep call brief due to Dustin Duffy had to assist aide.   Background:   Past Medical History:  Diagnosis Date   BPH without urinary obstruction    Diabetes mellitus type 2 in nonobese (HCC)    Dyslipidemia    Essential hypertension    Renal impairment    Visual impairment     Assessment: Patient Reported Symptoms:  Cognitive Cognitive Status: Requires Assistance Decision Making, Alert and oriented to person, place, and time      Neurological Neurological Review of Symptoms: Not assessed    HEENT HEENT Symptoms Reported: Not assessed      Cardiovascular Cardiovascular Symptoms Reported: Not assessed    Respiratory Respiratory Symptoms Reported: No symptoms reported    Endocrine Patient reports the following symptoms related to hypoglycemia or hyperglycemia : Not assessed    Gastrointestinal Gastrointestinal Symptoms Reported: Not assessed      Genitourinary Additional Genitourinary Details: Remains on schedule for suprapubic catheter.    Integumentary Additional Integumentary Details: Dustin Duffy reports has wounds to right heel and buttocks, left heel has healed.  Home Health nurse is performing dressing changes, family is changing dressings on days nurse isn't there.    Musculoskeletal Additional Musculoskeletal Details: Recent fall, Dustin Duffy states he has a black eye from the fall.   Falls in the past year?: Yes    Psychosocial  Psychosocial Symptoms Reported: Not assessed            08/01/2023    3:19 PM  Depression screen PHQ 2/9  Decreased Interest 0  Down, Depressed, Hopeless 1  PHQ - 2 Score 1    There were no vitals filed for this visit.  Medications Reviewed Today   Medications were not reviewed in this encounter     Recommendation:   Continue Current Plan of Care -Patient will attend appointment for Suprapubic catheter placement 09/13/23 per Dustin Duffy.   -Patient will continue to receive personal care services -[facility] and family will continue to perform dressing changes to right heel and buttocks wounds.   Follow Up Plan:   Telephone follow-up two weeks  Dustin Duffy BSN, CCM [place]  [facility] RN Care Manager Direct Dial: [phone]  Fax: [phone]

## 2023-09-13 ENCOUNTER — Other Ambulatory Visit (HOSPITAL_COMMUNITY): Payer: Self-pay | Admitting: Urology

## 2023-09-13 ENCOUNTER — Encounter (HOSPITAL_COMMUNITY): Payer: Self-pay

## 2023-09-13 ENCOUNTER — Ambulatory Visit (HOSPITAL_COMMUNITY)
Admission: RE | Admit: 2023-09-13 | Discharge: 2023-09-13 | Disposition: A | Discharge status: Home / Self Care | Source: Ambulatory Visit | Attending: Urology | Admitting: Urology

## 2023-09-13 ENCOUNTER — Other Ambulatory Visit: Payer: Self-pay

## 2023-09-13 ENCOUNTER — Ambulatory Visit (HOSPITAL_COMMUNITY)
Admission: RE | Admit: 2023-09-13 | Discharge: 2023-09-13 | Disposition: A | Discharge status: Home / Self Care | Source: Ambulatory Visit | Attending: Urology

## 2023-09-13 DIAGNOSIS — N3942 Incontinence without sensory awareness: Secondary | ICD-10-CM

## 2023-09-13 DIAGNOSIS — R339 Retention of urine, unspecified: Secondary | ICD-10-CM

## 2023-09-13 DIAGNOSIS — N138 Other obstructive and reflux uropathy: Secondary | ICD-10-CM | POA: Insufficient documentation

## 2023-09-13 DIAGNOSIS — Z9181 History of falling: Secondary | ICD-10-CM | POA: Insufficient documentation

## 2023-09-13 DIAGNOSIS — N401 Enlarged prostate with lower urinary tract symptoms: Secondary | ICD-10-CM | POA: Diagnosis not present

## 2023-09-13 DIAGNOSIS — N400 Enlarged prostate without lower urinary tract symptoms: Secondary | ICD-10-CM | POA: Diagnosis not present

## 2023-09-13 DIAGNOSIS — Z9889 Other specified postprocedural states: Secondary | ICD-10-CM | POA: Insufficient documentation

## 2023-09-13 HISTORY — PX: IR CYSTOSTOMY TUBE PLACEMENT/BLADDER ASPIRATION: IMG1097

## 2023-09-13 LAB — CBC
HCT: 38.7 % — ABNORMAL LOW (ref 39.0–52.0)
Hemoglobin: 11.8 g/dL — ABNORMAL LOW (ref 13.0–17.0)
MCH: 25.5 pg — ABNORMAL LOW (ref 26.0–34.0)
MCHC: 30.5 g/dL (ref 30.0–36.0)
MCV: 83.6 fL (ref 80.0–100.0)
Platelets: 283 10*3/uL (ref 150–400)
RBC: 4.63 MIL/uL (ref 4.22–5.81)
RDW: 16.5 % — ABNORMAL HIGH (ref 11.5–15.5)
WBC: 6.8 10*3/uL (ref 4.0–10.5)
nRBC: 0 % (ref 0.0–0.2)

## 2023-09-13 LAB — GLUCOSE, CAPILLARY
Glucose-Capillary: 140 mg/dL — ABNORMAL HIGH (ref 70–99)
Glucose-Capillary: 109 mg/dL — ABNORMAL HIGH (ref 70–99)

## 2023-09-13 LAB — PROTIME-INR
INR: 1 (ref 0.8–1.2)
Prothrombin Time: 13.6 s (ref 11.4–15.2)

## 2023-09-13 MED ORDER — MIDAZOLAM HCL 2 MG/2ML IJ SOLN
INTRAMUSCULAR | Status: AC | PRN
  Administered 2023-09-13: .5 mg via INTRAVENOUS
  Administered 2023-09-13: 1 mg via INTRAVENOUS

## 2023-09-13 MED ORDER — LIDOCAINE HCL 1 % IJ SOLN
INTRAMUSCULAR | Status: AC
  Filled 2023-09-13: qty 20

## 2023-09-13 MED ORDER — FENTANYL CITRATE (PF) 100 MCG/2ML IJ SOLN
INTRAMUSCULAR | Status: AC
Start: 2023-09-13 — End: 2023-09-13
  Filled 2023-09-13: qty 2

## 2023-09-13 MED ORDER — LIDOCAINE HCL 1 % IJ SOLN
10.0000 mL | Freq: Once | INTRAMUSCULAR | Status: AC
  Administered 2023-09-13: 10 mL via INTRADERMAL

## 2023-09-13 MED ORDER — IOHEXOL 300 MG/ML  SOLN
50.0000 mL | Freq: Once | INTRAMUSCULAR | Status: AC | PRN
  Administered 2023-09-13: 20 mL

## 2023-09-13 MED ORDER — FENTANYL CITRATE (PF) 100 MCG/2ML IJ SOLN
INTRAMUSCULAR | Status: AC | PRN
  Administered 2023-09-13: 25 ug via INTRAVENOUS
  Administered 2023-09-13: 50 ug via INTRAVENOUS

## 2023-09-13 MED ORDER — LIDOCAINE VISCOUS HCL 2 % MT SOLN
OROMUCOSAL | Status: AC
Start: 2023-09-13 — End: 2023-09-13
  Filled 2023-09-13: qty 15

## 2023-09-13 MED ORDER — SODIUM CHLORIDE 0.9% FLUSH: 5.0000 mL | Freq: Three times a day (TID) | INTRAVENOUS | Status: DC

## 2023-09-13 MED ORDER — MIDAZOLAM HCL 2 MG/2ML IJ SOLN
INTRAMUSCULAR | Status: AC
  Filled 2023-09-13: qty 2

## 2023-09-13 NOTE — Procedures (Signed)
  Procedure:  Fluoro guided suprapubic catheter placement  65F [place] tip Preprocedure diagnosis: Diagnoses of Urinary retention, Incontinence without sensory awareness, and BPH with urinary obstruction were pertinent to this visit. Postprocedure diagnosis: same EBL:    minimal Complications:   none immediate  See full dictation in [employer].  Nicky Barrack MD Main # [phone] Pager  [phone] Mobile [phone]

## 2023-09-13 NOTE — Sedation Documentation (Signed)
 Pt. Arrived to IR with large bruise on R eye socket/face and large bump on R forehead. Per pt. He fell this week at home. Pt. Confirmed safety at home, stated feeling safe with caregivers at home, and denied any harm other than recent fall.

## 2023-09-13 NOTE — H&P (Signed)
 Chief Complaint: Patient was seen in consultation today for urinary retention-- suprapubic catheter placement at the request of Bell,Eugene D III  Referring Physician(s): Maralyn Sender III  Supervising Physician: Fernando Hoyer  Patient Status: [facility] - Out-pt  History of Present Illness: Dustin Duffy is a 68 y.o. male   FULL Code status per pt  Known BPH with urinary obstruction/retention--- chronic indwelling folwey catheter Follows with Dr Parke Boll Pt has HHRN- lives at home Recent falls and difficulty managing home  Request for suprapubic catheter placement  Planned for today in IR    Past Medical History:  Diagnosis Date   BPH without urinary obstruction    Diabetes mellitus type 2 in nonobese (HCC)    Dyslipidemia    Essential hypertension    Renal impairment    Visual impairment     Past Surgical History:  Procedure Laterality Date   ORIF HUMERUS FRACTURE Left 06/22/2023   Procedure: OPEN REDUCTION INTERNAL FIXATION (ORIF) PROXIMAL HUMERUS FRACTURE;  Surgeon: Osa Blase, MD;  Location: [facility] ORS;  Service: Orthopedics;  Laterality: Left;    Allergies: Patient has no known allergies.  Medications: Prior to Admission medications   Medication Sig Start Date End Date Taking? Authorizing Provider  acetaminophen  (TYLENOL ) 325 MG tablet Take 2 tablets (650 mg total) by mouth every 6 (six) hours as needed for mild pain (pain score 1-3) (or Fever >/= 101). 04/15/23  Yes Lorita Rosa, MD  amitriptyline  (ELAVIL ) 50 MG tablet Take 50 mg by mouth at bedtime.   Yes [provider]  aspirin  EC 81 MG tablet Take 81 mg by mouth in the morning. Swallow whole.    [provider]  cholecalciferol (VITAMIN D3) 25 MCG (1000 UNIT) tablet Take 1,000 Units by mouth every Monday, Wednesday, and Friday.    [provider]  fludrocortisone  (FLORINEF ) 0.1 MG tablet Take 1 tablet (0.1 mg total) by mouth daily. 06/09/22   Leona Rake, MD  gabapentin   (NEURONTIN ) 300 MG capsule Take 900 mg by mouth at bedtime. 02/05/21   [provider]  ipratropium (ATROVENT ) 0.03 % nasal spray Place 2 sprays into both nostrils 2 (two) times daily as needed for rhinitis.    [provider]  JANUVIA 50 MG tablet Take 50 mg by mouth in the morning.    [provider]  midodrine  (PROAMATINE ) 5 MG tablet Take 1 tablet (5 mg total) by mouth 3 (three) times daily with meals. Patient taking differently: Take 5 mg by mouth See admin instructions. Take 5 mg by mouth in the morning, at 2 PM, and bedtime 06/09/22   Leona Rake, MD  MYRBETRIQ  50 MG TB24 tablet Take 50 mg by mouth in the morning.    [provider]  oxyCODONE  (ROXICODONE ) 5 MG immediate release tablet Take 1 tablet (5 mg total) by mouth every 4 (four) hours as needed for severe pain (pain score 7-10). 06/24/23   Gherghe, Costin M, MD  simvastatin  (ZOCOR ) 20 MG tablet Take 1 tablet (20 mg total) by mouth every evening. Do not take this medication until you finish Diflucan . Patient taking differently: Take 20 mg by mouth at bedtime. 05/02/22   Lonita Roach, MD  Water For Irrigation, Sterile (STERILE WATER) Irrigate with as directed See admin instructions. Mix sterile water with white vinegar 4:1 and flush indwelling catheter nightly before bedtime    [provider]  XIGDUO XR 10-500 MG TB24 Take 1 tablet by mouth daily before breakfast.    [provider]     History reviewed. No pertinent family history.  Social History   Socioeconomic History   Marital status: Divorced    Spouse name: Not on file   Number of children: Not on file   Years of education: Not on file   Highest education level: Not on file  Occupational History   Occupation: disabled  Tobacco Use   Smoking status: Former    Current packs/day: 0.00    Average packs/day: 1 pack/day for 35.0 years (35.0 ttl pk-yrs)    Types: Cigarettes    Start date: 23    Quit date: 2012     Years since quitting: 13.4   Smokeless tobacco: Never  Substance and Sexual Activity   Alcohol use: Not on file    Comment: heavy use, quit in 2008   Drug use: Not Currently   Sexual activity: Not on file  Other Topics Concern   Not on file  Social History Narrative   Not on file   Social Drivers of Health   Financial Resource Strain: Not on file  Food Insecurity: No Food Insecurity (08/29/2023)   Hunger Vital Sign    Worried About Running Out of Food in the Last Year: Never true    Ran Out of Food in the Last Year: Never true  Transportation Needs: No Transportation Needs (08/29/2023)   PRAPARE - Administrator, Civil Service (Medical): No    Lack of Transportation (Non-Medical): No  Recent Concern: Transportation Needs - Unmet Transportation Needs (06/21/2023)   PRAPARE - Administrator, Civil Service (Medical): Yes    Lack of Transportation (Non-Medical): Yes  Physical Activity: Not on file  Stress: Not on file  Social Connections: Socially Isolated (06/21/2023)   Social Connection and Isolation Panel    Frequency of Communication with Friends and Family: More than three times a week    Frequency of Social Gatherings with Friends and Family: Once a week    Attends Religious Services: Never    Database administrator or Organizations: No    Attends Engineer, structural: Never    Marital Status: Divorced    Review of Systems: A 12 point ROS discussed and pertinent positives are indicated in the HPI above.  All other systems are negative.  Review of Systems  Constitutional:  Positive for activity change. Negative for fatigue, fever and unexpected weight change.  Respiratory:  Negative for cough and shortness of breath.   Cardiovascular:  Negative for chest pain.  Gastrointestinal:  Negative for abdominal pain.  Musculoskeletal:  Positive for gait problem.  Neurological:  Positive for weakness.  Psychiatric/Behavioral:  Negative for  behavioral problems and confusion.     Vital Signs: BP 121/84   Pulse 85   Temp 97.6 F (36.4 C) (Oral)   Resp 17   Ht 5' 8 (1.727 m)   Wt 115 lb (52.2 kg)   SpO2 96%   BMI 17.49 kg/m   Advance Care Plan: The advanced care plan/surrogate decision maker was discussed at the time of visit and documented in the medical record.    Physical Exam Vitals reviewed.  HENT:     Mouth/Throat:     Mouth: Mucous membranes are moist.   Cardiovascular:     Rate and Rhythm: Normal rate and regular rhythm.     Heart sounds: Normal heart sounds. No murmur heard. Pulmonary:     Effort: Pulmonary effort is normal.     Breath sounds:  Normal breath sounds. No wheezing.  Abdominal:     Palpations: Abdomen is soft.   Musculoskeletal:        General: Normal range of motion.     Comments: Uses walker/wheel chair   Skin:    General: Skin is warm.   Neurological:     Mental Status: He is alert and oriented to person, place, and time.   Psychiatric:        Behavior: Behavior normal.     Imaging: No results found.  Labs:  CBC: Recent Labs    06/22/23 0521 06/23/23 0432 06/24/23 0612 06/26/23 0456  WBC 7.7 6.1 11.5* 9.1  HGB 9.9* 9.0* 9.9* 10.2*  HCT 32.8* 30.3* 33.1* 33.2*  PLT 207 197 266 304    COAGS: No results for input(s): INR, APTT in the last 8760 hours.  BMP: Recent Labs    06/24/23 0612 06/25/23 1440 06/26/23 0456 06/27/23 0512  NA 135 137 135 132*  K 4.4 4.1 3.0* 3.5  CL 99 97* 94* 94*  CO2 29 33* 33* 31  GLUCOSE 240* 334* 218* 233*  BUN 25* 26* 21 21  CALCIUM 8.3* 8.4* 8.1* 8.0*  CREATININE 0.89 0.98 0.65 0.56*  GFRNONAA >60 >60 >60 >60    LIVER FUNCTION TESTS: Recent Labs    04/12/23 1815 06/22/23 0521 06/24/23 0612 06/26/23 0456  BILITOT 1.3* 0.6 0.4 0.6  AST 47* 13* 14* 22  ALT 18 13 16 21   ALKPHOS 85 52 61 60  PROT 7.7 5.0* 5.3* 5.4*  ALBUMIN  3.3* 2.1* 2.4* 2.4*    TUMOR MARKERS: No results for input(s): AFPTM, CEA,  CA199, CHROMGRNA in the last 8760 hours.  Assessment and Plan:  Scheduled for supra pubic catheter placement Risks and benefits discussed with the patient including bleeding, infection, damage to adjacent structures, and sepsis.  All of the patient's questions were answered, patient is agreeable to proceed. Consent signed and in chart.  Thank you for this interesting consult.  I greatly enjoyed meeting Dustin Duffy and look forward to participating in their care.  A copy of this report was sent to the requesting provider on this date.  Electronically Signed: Ellen Guppy, PA-C 09/13/2023, 8:56 AM   I spent a total of  30 Minutes   in face to face in clinical consultation, greater than 50% of which was counseling/coordinating care for supra pubic catheter placement

## 2023-09-15 ENCOUNTER — Other Ambulatory Visit: Payer: Self-pay

## 2023-09-15 DIAGNOSIS — Z794 Long term (current) use of insulin: Secondary | ICD-10-CM | POA: Diagnosis not present

## 2023-09-15 DIAGNOSIS — S42202D Unspecified fracture of upper end of left humerus, subsequent encounter for fracture with routine healing: Secondary | ICD-10-CM | POA: Diagnosis not present

## 2023-09-15 DIAGNOSIS — N182 Chronic kidney disease, stage 2 (mild): Secondary | ICD-10-CM | POA: Diagnosis not present

## 2023-09-15 DIAGNOSIS — I700 Atherosclerosis of aorta: Secondary | ICD-10-CM | POA: Diagnosis not present

## 2023-09-15 DIAGNOSIS — I129 Hypertensive chronic kidney disease with stage 1 through stage 4 chronic kidney disease, or unspecified chronic kidney disease: Secondary | ICD-10-CM | POA: Diagnosis not present

## 2023-09-15 DIAGNOSIS — E1122 Type 2 diabetes mellitus with diabetic chronic kidney disease: Secondary | ICD-10-CM | POA: Diagnosis not present

## 2023-09-16 ENCOUNTER — Encounter (HOSPITAL_COMMUNITY): Payer: Self-pay

## 2023-09-16 ENCOUNTER — Other Ambulatory Visit: Payer: Self-pay

## 2023-09-16 ENCOUNTER — Emergency Department (HOSPITAL_COMMUNITY)
Admission: EM | Admit: 2023-09-16 | Discharge: 2023-09-16 | Disposition: A | Discharge status: Home / Self Care | Attending: Emergency Medicine | Admitting: Emergency Medicine

## 2023-09-16 DIAGNOSIS — S0083XA Contusion of other part of head, initial encounter: Secondary | ICD-10-CM | POA: Insufficient documentation

## 2023-09-16 DIAGNOSIS — T83091A Other mechanical complication of indwelling urethral catheter, initial encounter: Secondary | ICD-10-CM | POA: Diagnosis not present

## 2023-09-16 DIAGNOSIS — R739 Hyperglycemia, unspecified: Secondary | ICD-10-CM | POA: Diagnosis not present

## 2023-09-16 DIAGNOSIS — H1131 Conjunctival hemorrhage, right eye: Secondary | ICD-10-CM | POA: Diagnosis not present

## 2023-09-16 DIAGNOSIS — Z7984 Long term (current) use of oral hypoglycemic drugs: Secondary | ICD-10-CM | POA: Insufficient documentation

## 2023-09-16 DIAGNOSIS — Y828 Other medical devices associated with adverse incidents: Secondary | ICD-10-CM | POA: Insufficient documentation

## 2023-09-16 DIAGNOSIS — W01198A Fall on same level from slipping, tripping and stumbling with subsequent striking against other object, initial encounter: Secondary | ICD-10-CM | POA: Diagnosis not present

## 2023-09-16 DIAGNOSIS — T83028A Displacement of other indwelling urethral catheter, initial encounter: Secondary | ICD-10-CM | POA: Diagnosis not present

## 2023-09-16 DIAGNOSIS — T839XXA Unspecified complication of genitourinary prosthetic device, implant and graft, initial encounter: Secondary | ICD-10-CM

## 2023-09-16 LAB — CBC WITH DIFFERENTIAL/PLATELET
Abs Immature Granulocytes: 0.07 10*3/uL (ref 0.00–0.07)
Basophils Absolute: 0.1 10*3/uL (ref 0.0–0.1)
Basophils Relative: 1 %
Eosinophils Absolute: 0.2 10*3/uL (ref 0.0–0.5)
Eosinophils Relative: 2 %
HCT: 40 % (ref 39.0–52.0)
Hemoglobin: 12.2 g/dL — ABNORMAL LOW (ref 13.0–17.0)
Immature Granulocytes: 1 %
Lymphocytes Relative: 13 %
Lymphs Abs: 1.4 10*3/uL (ref 0.7–4.0)
MCH: 25.3 pg — ABNORMAL LOW (ref 26.0–34.0)
MCHC: 30.5 g/dL (ref 30.0–36.0)
MCV: 83 fL (ref 80.0–100.0)
Monocytes Absolute: 0.9 10*3/uL (ref 0.1–1.0)
Monocytes Relative: 9 %
Neutro Abs: 8 10*3/uL — ABNORMAL HIGH (ref 1.7–7.7)
Neutrophils Relative %: 74 %
Platelets: 276 10*3/uL (ref 150–400)
RBC: 4.82 MIL/uL (ref 4.22–5.81)
RDW: 16.6 % — ABNORMAL HIGH (ref 11.5–15.5)
WBC: 10.7 10*3/uL — ABNORMAL HIGH (ref 4.0–10.5)
nRBC: 0 % (ref 0.0–0.2)

## 2023-09-16 LAB — BASIC METABOLIC PANEL WITH GFR
Anion gap: 9 (ref 5–15)
BUN: 38 mg/dL — ABNORMAL HIGH (ref 8–23)
CO2: 26 mmol/L (ref 22–32)
Calcium: 9.1 mg/dL (ref 8.9–10.3)
Chloride: 102 mmol/L (ref 98–111)
Creatinine, Ser: 1.06 mg/dL (ref 0.61–1.24)
GFR, Estimated: >60 mL/min (ref >60)
Glucose, Bld: 148 mg/dL — ABNORMAL HIGH (ref 70–99)
Potassium: 3.9 mmol/L (ref 3.5–5.1)
Sodium: 137 mmol/L (ref 135–145)

## 2023-09-16 LAB — URINALYSIS, ROUTINE W REFLEX MICROSCOPIC
Bilirubin Urine: NEGATIVE
Glucose, UA: >=500 mg/dL — AB
Ketones, ur: NEGATIVE mg/dL
Nitrite: NEGATIVE
Protein, ur: NEGATIVE mg/dL
Specific Gravity, Urine: 1.006 (ref 1.005–1.030)
WBC, UA: >50 WBC/hpf (ref 0–5)
pH: 5 (ref 5.0–8.0)

## 2023-09-16 NOTE — ED Notes (Addendum)
 500 mL of fluid irrigated through catheter. Pt started to urinate clear fluid through penis, pt saturated diaper. Urine sample sample obtained at the begin of irrigation urine yellow in color. No fluid from bladder present in bag. Suprapubic dressing wet small leaking from site.

## 2023-09-16 NOTE — ED Notes (Signed)
 Pt report he has been urinating through his penis since yesterday morning. Urine noted in pt diaper. Pt also concerned about a hematoma on right forehead from prior fall two weeks ago.

## 2023-09-16 NOTE — ED Triage Notes (Signed)
 PT BIBA from home for urinary retention.  Per EMS pt reports no/little output since last night in suprapubic catheter that was placed this past Wednesday.  Pt had chronic foley for 4 years prior.  Pain 3/10  V/S 130/80 94 HR 20RR 95% RA  289 CBG  98.1 Temp

## 2023-09-16 NOTE — ED Provider Notes (Signed)
 [place] EMERGENCY DEPARTMENT AT [facility] Provider Note   CSN: [id number] Arrival date & time: 09/16/23  1032     Patient presents with: Urinary Retention   Dustin Duffy is a 68 y.o. male.   68 year old male presenting with catheter problem.  Patient had suprapubic catheter placed at [facility] on Wednesday, he had chronic indwelling Foley for several years prior to this.  Patient was doing well but noticed yesterday that urine was not draining into his catheter bag, but seem to be leaking out of my penis into his depends and on his bed.  He also reports a fall earlier in the week prior to this procedure, he was sitting on the couch and leaning forward to reach something on the floor when he fell forward and struck his right forehead on the ground.  He denies loss of consciousness, is unsure if he is on any blood thinners aside from aspirin .  He has not been evaluated for this issue, denies changes in vision but reports I am legally blind.  He denies fevers, diarrhea, nausea/vomiting, hematuria.  Patient is unsure when he is to follow-up with urology for management of his suprapubic catheter.        Prior to Admission medications   Medication Sig Start Date End Date Taking? Authorizing Provider  acetaminophen  (TYLENOL ) 325 MG tablet Take 2 tablets (650 mg total) by mouth every 6 (six) hours as needed for mild pain (pain score 1-3) (or Fever >/= 101). 04/15/23   Barbarann Nest, MD  amitriptyline  (ELAVIL ) 50 MG tablet Take 50 mg by mouth at bedtime.    [provider]  aspirin  EC 81 MG tablet Take 81 mg by mouth in the morning. Swallow whole.    [provider]  cholecalciferol (VITAMIN D3) 25 MCG (1000 UNIT) tablet Take 1,000 Units by mouth every Monday, Wednesday, and Friday.    [provider]  fludrocortisone  (FLORINEF ) 0.1 MG tablet Take 1 tablet (0.1 mg total) by mouth daily. 06/09/22   Jillian Buttery, MD  gabapentin  (NEURONTIN ) 300 MG capsule  Take 900 mg by mouth at bedtime. 02/05/21   [provider]  ipratropium (ATROVENT ) 0.03 % nasal spray Place 2 sprays into both nostrils 2 (two) times daily as needed for rhinitis.    [provider]  JANUVIA 50 MG tablet Take 50 mg by mouth in the morning.    [provider]  midodrine  (PROAMATINE ) 5 MG tablet Take 1 tablet (5 mg total) by mouth 3 (three) times daily with meals. Patient taking differently: Take 5 mg by mouth See admin instructions. Take 5 mg by mouth in the morning, at 2 PM, and bedtime 06/09/22   Jillian Buttery, MD  MYRBETRIQ  50 MG TB24 tablet Take 50 mg by mouth in the morning.    [provider]  oxyCODONE  (ROXICODONE ) 5 MG immediate release tablet Take 1 tablet (5 mg total) by mouth every 4 (four) hours as needed for severe pain (pain score 7-10). 06/24/23   Gherghe, Costin M, MD  simvastatin  (ZOCOR ) 20 MG tablet Take 1 tablet (20 mg total) by mouth every evening. Do not take this medication until you finish Diflucan . Patient taking differently: Take 20 mg by mouth at bedtime. 05/02/22   Jadine Toribio SQUIBB, MD  Water For Irrigation, Sterile (STERILE WATER) Irrigate with as directed See admin instructions. Mix sterile water with white vinegar 4:1 and flush indwelling catheter nightly before bedtime    [provider]  XIGDUO XR 10-500  MG TB24 Take 1 tablet by mouth daily before breakfast.    [provider]    Allergies: Patient has no known allergies.    Review of Systems  Updated Vital Signs  Vitals:   09/16/23 1039 09/16/23 1402  BP: 137/88 137/82  Pulse: 94 92  Resp: 18 18  Temp: 98.3 F (36.8 C) 98.3 F (36.8 C)  SpO2: 99% 95%  ,  Physical Exam Vitals and nursing note reviewed.  Constitutional:      Comments: Cachectic   HENT:     Head:     Comments: Purple ecchymosis surrounding right eye, no blood in AC or subconjunctival hemorrhage, with ~2cm circular hematoma of right forehead above brow  Eyes:      Extraocular Movements: Extraocular movements intact.     Pupils: Pupils are equal, round, and reactive to light.    Cardiovascular:     Rate and Rhythm: Normal rate.  Pulmonary:     Effort: Pulmonary effort is normal.  Abdominal:     Palpations: Abdomen is soft.     Tenderness: There is abdominal tenderness (mild, generalized).     Comments: No guarding/rigidity. Suprapubic catheter in place, mild erythema around opening, overlying dressings in place. Small amount of clear yellow urine in catheter bag.   Musculoskeletal:     Comments: Moves all extremities spontaneously without difficulty   Neurological:     General: No focal deficit present.     Mental Status: He is alert and oriented to person, place, and time.     (all labs ordered are listed, but only abnormal results are displayed) Labs Reviewed  CBC WITH DIFFERENTIAL/PLATELET - Abnormal; Notable for the following components:      Result Value   WBC 10.7 (*)    Hemoglobin 12.2 (*)    MCH 25.3 (*)    RDW 16.6 (*)    Neutro Abs 8.0 (*)    All other components within normal limits  BASIC METABOLIC PANEL WITH GFR - Abnormal; Notable for the following components:   Glucose, Bld 148 (*)    BUN 38 (*)    All other components within normal limits  URINALYSIS, ROUTINE W REFLEX MICROSCOPIC - Abnormal; Notable for the following components:   APPearance CLOUDY (*)    Glucose, UA >=500 (*)    Hgb urine dipstick SMALL (*)    Leukocytes,Ua LARGE (*)    Bacteria, UA MANY (*)    All other components within normal limits    EKG: None  Radiology: No results found.   Procedures   Medications Ordered in the ED - No data to display                                  Medical Decision Making This patient presents to the ED for concern of catheter problem, this involves an extensive number of treatment options, and is a complaint that carries with it a high risk of complications and morbidity.  The differential diagnosis  includes catheter leak, catheter clog, other catheter malfunction, UTI.   Co morbidities that complicate the patient evaluation  Suprapubic catheter, history of BPH and urinary incontinence   Additional history obtained:  Additional history obtained from record review External records from outside source obtained and reviewed including recent IR procedure note   Lab Tests:  I Ordered, and personally interpreted labs.  The pertinent results include: CBC notable for mild leukocytosis of 10.7,  hemoglobin of 12.2 however this is improved from most recent baseline.  BMP with elevation in BUN.  Urinalysis notable for glucosuria, small RBCs, large leuks with many bacteria, will send for culture despite nitrite negative.   Cardiac Monitoring: / EKG:  The patient was maintained on a cardiac monitor.  I personally viewed and interpreted the cardiac monitored which showed an underlying rhythm of: NSR   Consultations Obtained:  I requested consultation with the urologist,  and discussed lab and imaging findings as well as pertinent plan - they recommend: spoke with Dr. Nieves, he recommends that I reach out to interventional radiology in the event that they would like to replace the patient's suprapubic catheter, he also recommends that a Foley catheter be placed if this cannot be done today. I spoke with Dr. Vanice with IR, he recommends that Foley catheter be placed in the emergency department today and outpatient follow-up with his urologist for further management of this condition.   Problem List / ED Course / Critical interventions / Medication management I have reviewed the patients home medicines and have made adjustments as needed   Social Determinants of Health:  Former tobacco use   Test / Admission - Considered:  Physical exam noted below as above, vitals are reassuring, patient is afebrile and does not have tachycardia, he is not demonstrating any signs of systemic illness.   Patient does have some bruising of his right eye, however this is from nearly 1 week ago, he is alert and oriented with no neurologic deficits, declines head CT today which I feel is reasonable.  I requested that nursing staff irrigate the suprapubic catheter to assess its function, per nursing staff: 500 mL of fluid irrigated through catheter. Pt started to urinate clear fluid through penis, pt saturated diaper. Urine sample sample obtained at the begin of irrigation urine yellow in color. No fluid from bladder present in bag. Suprapubic dressing wet small leaking from site.  Nursing staff was able to successfully insert a Foley catheter, 200 mL of urine was drained into the bag, this was taken for additional urinalysis, nitrite negative on initial urinalysis but will send for urine culture.  See above for urology and interventional radiology recommendations.  I spoke in depth with patient that Foley catheter needs to be left in place for the time being until he can be seen by his urologist, who will then decide next steps in his care.  Suprapubic catheter remains in place as well, no obvious infection around the site of this placement.  Patient voices understanding and is in agreement this plan.  Return precautions discussed.  He is appropriate for discharge at this time.  Staffed with Dr. Elnor    Amount and/or Complexity of Data Reviewed Labs: ordered.        Final diagnoses:  Problem with urinary catheter [facility])    ED Discharge Orders     None          Glendia Rocky SAILOR, [place] 09/16/23 1617    Elnor Savant A, DO 09/17/23 0715

## 2023-09-17 ENCOUNTER — Telehealth (HOSPITAL_BASED_OUTPATIENT_CLINIC_OR_DEPARTMENT_OTHER): Payer: Self-pay | Admitting: Emergency Medicine

## 2023-09-17 LAB — URINE CULTURE: Culture: 70000 — AB

## 2023-09-17 MED ORDER — CEFUROXIME AXETIL 250 MG PO TABS: 250.0000 mg | ORAL_TABLET | Freq: Two times a day (BID) | ORAL | 0 refills | Status: AC

## 2023-09-17 NOTE — Telephone Encounter (Signed)
 Sent abx for uti

## 2023-09-18 ENCOUNTER — Telehealth (HOSPITAL_BASED_OUTPATIENT_CLINIC_OR_DEPARTMENT_OTHER): Payer: Self-pay | Admitting: *Deleted

## 2023-09-18 NOTE — Patient Outreach (Signed)
 Complex Care Management   Visit Note  09/18/2023  Name:  Dustin Duffy MRN: [id number] DOB: [birth year]  Situation: Referral received for Complex Care Management related to Falls  I obtained verbal consent from Caregiver.  Brief visit completed with sister, Dustin Duffy  on the phone.    Background:   Past Medical History:  Diagnosis Date   BPH without urinary obstruction    Diabetes mellitus type 2 in nonobese (HCC)    Dyslipidemia    Essential hypertension    Renal impairment    Visual impairment     Assessment: Patient Reported Symptoms:  Cognitive Cognitive Status: Requires Assistance Decision Making      Neurological Neurological Review of Symptoms: Not assessed    HEENT HEENT Symptoms Reported: Not assessed      Cardiovascular Cardiovascular Symptoms Reported: Not assessed    Respiratory Respiratory Symptoms Reported: Not assesed    Endocrine Patient reports the following symptoms related to hypoglycemia or hyperglycemia : Not assessed    Gastrointestinal Gastrointestinal Symptoms Reported: Not assessed      Genitourinary Genitourinary Symptoms Reported: No symptoms reported Additional Genitourinary Details: Suprapubic catheter in place 09/13/23    Integumentary Integumentary Symptoms Reported: Not assessed    Musculoskeletal Musculoskelatal Symptoms Reviewed: Not assessed        Psychosocial Psychosocial Symptoms Reported: Not assessed            08/01/2023    3:19 PM  Depression screen PHQ 2/9  Decreased Interest 0  Down, Depressed, Hopeless 1  PHQ - 2 Score 1    There were no vitals filed for this visit.  Medications Reviewed Today   Medications were not reviewed in this encounter     Recommendation:   -Patient is doing well after suprapubic catheter placement.  -Patient and sister will be meeting with Civil engineer, contracting next week to discuss Hospice care.   Follow Up Plan:   Telephone follow-up in 1 week  Santana Stamp BSN,  CCM [place]  [facility] Population Health RN Care Manager Direct Dial: [phone]  Fax: [phone]

## 2023-09-18 NOTE — Progress Notes (Signed)
 ED Antimicrobial Stewardship Positive Culture Follow Up   Dustin Duffy is an 68 y.o. male who presented to [facility] on 09/16/2023 with a chief complaint of  Chief Complaint  Patient presents with   Urinary Retention    Recent Results (from the past 720 hours)  Urine Culture     Status: Abnormal   Collection Time: 09/16/23 11:12 AM   Specimen: Urine, Clean Catch  Result Value Ref Range Status   Specimen Description   Final    URINE, CLEAN CATCH Performed at [facility], [address], [place], [place] [address]    Special Requests   Final    NONE Performed at [facility], [address] [place], [place] [address]    Culture 70,000 COLONIES/mL YEAST (A)  Final   Report Status 09/17/2023 FINAL  Final    Pt presented with urinary retention from suprapubic catheter placement 6/18. Mild abdominal pain, mildly elevated WBC at 10.7. Denies fever, n/v. No urinary complaints. UA: WBC >50, bacteria and yeast present- has been colonized over past 2 years. Cefuroxime 250 mg 1BID x7d (stop date 6/29) called in for UTI. Likely colonization, no treatment indicated; no changes necessary.  ED Provider: Norleen Essex, PA   [name], [place] PharmD Candidate 09/18/2023, 11:17 AM Monday - Friday phone -  [phone] Saturday - Sunday phone - [phone]

## 2023-09-18 NOTE — Telephone Encounter (Signed)
 Post ED Visit - Positive Culture Follow-up  Culture report reviewed by antimicrobial stewardship pharmacist: [facility] [address], Pharm.D. []  Venetia Gully, Pharm.D., BCPS AQ-ID []  Garrel Crews, Pharm.D., BCPS []  Almarie Lunger, Pharm.D., BCPS []  [place], [address].D., BCPS, AAHIVP []  Rosaline Bihari, Pharm.D., BCPS, AAHIVP []  Vernell Meier, PharmD, BCPS []  Latanya Hint, PharmD, BCPS []  Donald Medley, PharmD, BCPS []  Rocky Bold, PharmD []  Dorothyann Alert, PharmD, BCPS []  Morene Babe, PharmD  [facility] [x]  Damien Quiet, PharmD []  Romona Bliss, PharmD []  Dolphus Roller, PharmD []  Veva Seip, Rph []  Vernell Daunt) Leonce, PharmD []  Eva Allis, PharmD []  Rosaline Millet, PharmD []  Iantha Batch, PharmD []  Arvin Gauss, PharmD []  Wanda Hasting, PharmD []  Ronal Rav, PharmD []  Rocky Slade, PharmD []  Bard Jeans, PharmD   Positive urine culture Treated with cefuroxime.  No output from SP catheter placed 6/18; + abd tenderness.  Foley placed in ED w/ urine return.  Likely colonization.  No treatment indicated; no further patient follow-up is required at this time, per John Robinson, PA-C.  Lorita Barnie Pereyra 09/18/2023, 12:11 PM

## 2023-09-18 NOTE — Patient Instructions (Signed)
 Visit Information  Thank you for taking time to visit with me today. Please don't hesitate to contact me if I can be of assistance to you before our next scheduled appointment.  Your next care management appointment is by telephone on Thursday, July 3rd at 2:45pm.   Please call the care guide team at [phone] if you need to cancel, schedule, or reschedule an appointment.   A reminder to ALL patients/family/friends, please call the [place]  National Suicide Prevention Lifeline: 620-767-1429 or TTY: (706)818-0052 TTY 318-657-8258) to talk to a trained counselor if you are experiencing a Mental Health or Behavioral Health Crisis or need someone to talk to.  Santana Stamp BSN, CCM [place]  [facility] Population Health RN Care Manager Direct Dial: [phone]  Fax: [phone]

## 2023-09-19 ENCOUNTER — Other Ambulatory Visit: Payer: Self-pay | Admitting: Licensed Clinical Social Worker

## 2023-09-19 DIAGNOSIS — Z794 Long term (current) use of insulin: Secondary | ICD-10-CM | POA: Diagnosis not present

## 2023-09-19 DIAGNOSIS — I129 Hypertensive chronic kidney disease with stage 1 through stage 4 chronic kidney disease, or unspecified chronic kidney disease: Secondary | ICD-10-CM | POA: Diagnosis not present

## 2023-09-19 DIAGNOSIS — N182 Chronic kidney disease, stage 2 (mild): Secondary | ICD-10-CM | POA: Diagnosis not present

## 2023-09-19 DIAGNOSIS — E1122 Type 2 diabetes mellitus with diabetic chronic kidney disease: Secondary | ICD-10-CM | POA: Diagnosis not present

## 2023-09-19 DIAGNOSIS — I700 Atherosclerosis of aorta: Secondary | ICD-10-CM | POA: Diagnosis not present

## 2023-09-19 DIAGNOSIS — S42202D Unspecified fracture of upper end of left humerus, subsequent encounter for fracture with routine healing: Secondary | ICD-10-CM | POA: Diagnosis not present

## 2023-09-19 NOTE — Patient Instructions (Signed)
 Visit Information  Thank you for taking time to visit with me today. Please don't hesitate to contact me if I can be of assistance to you before our next scheduled appointment.  Your next care management appointment is by telephone on 07/21 at 11 AM  Please call the care guide team at [phone] if you need to cancel, schedule, or reschedule an appointment.   Please call the Suicide and Crisis Lifeline: 988 call 1-800-273-TALK (toll free, 24 hour hotline) call 911 if you are experiencing a Mental Health or Behavioral Health Crisis or need someone to talk to.  Rolin Kerns, LCSW [place]  [facility], [facility] Clinical Social Worker Direct Dial: [phone]  Fax: [phone] Website: [email or web address] 1:40 PM

## 2023-09-19 NOTE — Patient Outreach (Signed)
 Complex Care Management   Visit Note  09/19/2023  Name:  Dustin Duffy MRN: [id number] DOB: 18-Jul-1955  Situation: Referral received for Complex Care Management related to Diabetes with Complications and Hx of Falls I obtained verbal consent from Caregiver.  Visit completed with [name] (friend)  on the phone  Background:   Past Medical History:  Diagnosis Date   BPH without urinary obstruction    Diabetes mellitus type 2 in nonobese (HCC)    Dyslipidemia    Essential hypertension    Renal impairment    Visual impairment     Assessment: Patient Reported Symptoms:  Cognitive Cognitive Status: Requires Assistance Decision Making Cognitive/Intellectual Conditions Management [RPT]: None reported or documented in medical history or problem list      Neurological Neurological Review of Symptoms: No symptoms reported    HEENT HEENT Symptoms Reported: No symptoms reported      Cardiovascular Cardiovascular Symptoms Reported: No symptoms reported    Respiratory Respiratory Symptoms Reported: No symptoms reported    Endocrine Patient reports the following symptoms related to hypoglycemia or hyperglycemia : Not assessed    Gastrointestinal Gastrointestinal Symptoms Reported: Not assessed      Genitourinary Genitourinary Symptoms Reported: Not assessed    Integumentary Integumentary Symptoms Reported: Not assessed    Musculoskeletal Musculoskelatal Symptoms Reviewed: Not assessed        Psychosocial Psychosocial Symptoms Reported: Other Other Psychosocial Conditions: Stress Management Additional Psychological Details: [name] reports she and family has assisted with strengthening support for patient in assisting with ADLs and housekeeping needs in home. Behavioral Health Conditions: Other Other Behavorial Health Conditions: Stress Behavioral Management Strategies: Support system, Coping strategies, Community resources Major Change/Loss/Stressor/Fears (CP): Medical condition,  self        08/01/2023    3:19 PM  Depression screen PHQ 2/9  Decreased Interest 0  Down, Depressed, Hopeless 1  PHQ - 2 Score 1    There were no vitals filed for this visit.  Medications Reviewed Today     Reviewed by Cortez Steelman D, LCSW (Social Worker) on 09/19/23 at 1332  Med List Status: <None>   Medication Order Taking? Sig Documenting Provider Last Dose Status Informant  acetaminophen  (TYLENOL ) 325 MG tablet [id number] No Take 2 tablets (650 mg total) by mouth every 6 (six) hours as needed for mild pain (pain score 1-3) (or Fever >/= 101). Barbarann Nest, MD Past Month Active Care Giver  amitriptyline  (ELAVIL ) 50 MG tablet [id number] No Take 50 mg by mouth at bedtime. [provider] 09/12/2023 Active Care Giver  aspirin  EC 81 MG tablet [id number] No Take 81 mg by mouth in the morning. Swallow whole. [provider] 09/05/2023 Active Care Giver  cefUROXime (CEFTIN) 250 MG tablet [id number]  Take 1 tablet (250 mg total) by mouth 2 (two) times daily with a meal for 7 days. Elnor Jayson LABOR, DO  Active   cholecalciferol (VITAMIN D3) 25 MCG (1000 UNIT) tablet [id number] No Take 1,000 Units by mouth every Monday, Wednesday, and Friday. [provider] Past Week Active Care Giver  fludrocortisone  (FLORINEF ) 0.1 MG tablet [id number] No Take 1 tablet (0.1 mg total) by mouth daily. Jillian Buttery, MD 09/12/2023 Active Care Giver  gabapentin  (NEURONTIN ) 300 MG capsule [id number] No Take 900 mg by mouth at bedtime. [provider] 09/12/2023 Active Care Giver           Med Note MARISA, NATHANEL SAILOR   Tue Jun 20, 2023  9:53 PM)    ipratropium (ATROVENT ) 0.03 %  nasal spray [id number] No Place 2 sprays into both nostrils 2 (two) times daily as needed for rhinitis. [provider] Past Month Active Care Giver           Med Note MARISA, NATHANEL SAILOR   Tue Jun 20, 2023  9:54 PM)    JANUVIA 50 MG tablet [id number] No Take 50 mg by mouth in the morning. [provider]  09/12/2023 Active Care Giver  midodrine  (PROAMATINE ) 5 MG tablet [id number] No Take 1 tablet (5 mg total) by mouth 3 (three) times daily with meals.  Patient taking differently: Take 5 mg by mouth See admin instructions. Take 5 mg by mouth in the morning, at 2 PM, and bedtime   Jillian Buttery, MD 09/12/2023 Active Care Giver  MYRBETRIQ  50 MG TB24 tablet [id number] No Take 50 mg by mouth in the morning. [provider] 09/12/2023 Active Care Giver  oxyCODONE  (ROXICODONE ) 5 MG immediate release tablet [id number] No Take 1 tablet (5 mg total) by mouth every 4 (four) hours as needed for severe pain (pain score 7-10). Gherghe, Costin M, MD Taking Active   simvastatin  (ZOCOR ) 20 MG tablet [id number] No Take 1 tablet (20 mg total) by mouth every evening. Do not take this medication until you finish Diflucan .  Patient taking differently: Take 20 mg by mouth at bedtime.   Jadine Toribio SQUIBB, MD 09/12/2023 Active Care Giver  Water For Irrigation, Sterile (STERILE WATER) [id number] No Irrigate with as directed See admin instructions. Mix sterile water with white vinegar 4:1 and flush indwelling catheter nightly before bedtime [provider] Taking Active Care Giver  XIGDUO XR 10-500 MG TB24 [id number] No Take 1 tablet by mouth daily before breakfast. [provider] 09/12/2023 Active Care Giver            Recommendation:   Continue Current Plan of Care  Follow Up Plan:   Telephone follow-up in 1 month  Rolin Kerns, [facility]  [facility], [facility] Clinical Social Worker Direct Dial: [phone]  Fax: [phone] Website: [email or web address] 1:39 PM

## 2023-09-21 DIAGNOSIS — I129 Hypertensive chronic kidney disease with stage 1 through stage 4 chronic kidney disease, or unspecified chronic kidney disease: Secondary | ICD-10-CM | POA: Diagnosis not present

## 2023-09-21 DIAGNOSIS — I700 Atherosclerosis of aorta: Secondary | ICD-10-CM | POA: Diagnosis not present

## 2023-09-21 DIAGNOSIS — Z794 Long term (current) use of insulin: Secondary | ICD-10-CM | POA: Diagnosis not present

## 2023-09-21 DIAGNOSIS — N182 Chronic kidney disease, stage 2 (mild): Secondary | ICD-10-CM | POA: Diagnosis not present

## 2023-09-21 DIAGNOSIS — S42202D Unspecified fracture of upper end of left humerus, subsequent encounter for fracture with routine healing: Secondary | ICD-10-CM | POA: Diagnosis not present

## 2023-09-21 DIAGNOSIS — E1122 Type 2 diabetes mellitus with diabetic chronic kidney disease: Secondary | ICD-10-CM | POA: Diagnosis not present

## 2023-09-25 ENCOUNTER — Telehealth (HOSPITAL_COMMUNITY): Payer: Self-pay

## 2023-09-25 NOTE — Telephone Encounter (Signed)
 Received call from [name] Daring to get pt scheduled for a SP tube change bc tube isn't working. Per the last note, pt is to f/u with urology. There is also a note from pt's ER visit where Dr. Vanice states for pt to f/u with urology for management options. I told [name] this but she is confused so I have reached out to urology and waiting for a call back. AB

## 2023-09-26 DIAGNOSIS — R338 Other retention of urine: Secondary | ICD-10-CM | POA: Diagnosis not present

## 2023-09-26 DIAGNOSIS — R310 Gross hematuria: Secondary | ICD-10-CM | POA: Diagnosis not present

## 2023-09-26 DIAGNOSIS — R8279 Other abnormal findings on microbiological examination of urine: Secondary | ICD-10-CM | POA: Diagnosis not present

## 2023-09-26 DIAGNOSIS — I129 Hypertensive chronic kidney disease with stage 1 through stage 4 chronic kidney disease, or unspecified chronic kidney disease: Secondary | ICD-10-CM | POA: Diagnosis not present

## 2023-09-26 DIAGNOSIS — N130 Hydronephrosis with ureteropelvic junction obstruction: Secondary | ICD-10-CM | POA: Diagnosis not present

## 2023-09-26 DIAGNOSIS — T83091A Other mechanical complication of indwelling urethral catheter, initial encounter: Secondary | ICD-10-CM | POA: Diagnosis not present

## 2023-09-26 DIAGNOSIS — S42202D Unspecified fracture of upper end of left humerus, subsequent encounter for fracture with routine healing: Secondary | ICD-10-CM | POA: Diagnosis not present

## 2023-09-26 DIAGNOSIS — N182 Chronic kidney disease, stage 2 (mild): Secondary | ICD-10-CM | POA: Diagnosis not present

## 2023-09-26 DIAGNOSIS — I700 Atherosclerosis of aorta: Secondary | ICD-10-CM | POA: Diagnosis not present

## 2023-09-26 DIAGNOSIS — E1122 Type 2 diabetes mellitus with diabetic chronic kidney disease: Secondary | ICD-10-CM | POA: Diagnosis not present

## 2023-09-26 DIAGNOSIS — Z794 Long term (current) use of insulin: Secondary | ICD-10-CM | POA: Diagnosis not present

## 2023-09-28 ENCOUNTER — Other Ambulatory Visit: Payer: Self-pay

## 2023-09-28 DIAGNOSIS — S42202D Unspecified fracture of upper end of left humerus, subsequent encounter for fracture with routine healing: Secondary | ICD-10-CM | POA: Diagnosis not present

## 2023-09-28 DIAGNOSIS — I700 Atherosclerosis of aorta: Secondary | ICD-10-CM | POA: Diagnosis not present

## 2023-09-28 DIAGNOSIS — I129 Hypertensive chronic kidney disease with stage 1 through stage 4 chronic kidney disease, or unspecified chronic kidney disease: Secondary | ICD-10-CM | POA: Diagnosis not present

## 2023-09-28 DIAGNOSIS — Z794 Long term (current) use of insulin: Secondary | ICD-10-CM | POA: Diagnosis not present

## 2023-09-28 DIAGNOSIS — N182 Chronic kidney disease, stage 2 (mild): Secondary | ICD-10-CM | POA: Diagnosis not present

## 2023-09-28 DIAGNOSIS — E1122 Type 2 diabetes mellitus with diabetic chronic kidney disease: Secondary | ICD-10-CM | POA: Diagnosis not present

## 2023-09-29 DIAGNOSIS — I129 Hypertensive chronic kidney disease with stage 1 through stage 4 chronic kidney disease, or unspecified chronic kidney disease: Secondary | ICD-10-CM | POA: Diagnosis not present

## 2023-09-29 DIAGNOSIS — N182 Chronic kidney disease, stage 2 (mild): Secondary | ICD-10-CM | POA: Diagnosis not present

## 2023-09-29 DIAGNOSIS — S42202D Unspecified fracture of upper end of left humerus, subsequent encounter for fracture with routine healing: Secondary | ICD-10-CM | POA: Diagnosis not present

## 2023-09-29 DIAGNOSIS — E1122 Type 2 diabetes mellitus with diabetic chronic kidney disease: Secondary | ICD-10-CM | POA: Diagnosis not present

## 2023-09-29 DIAGNOSIS — Z794 Long term (current) use of insulin: Secondary | ICD-10-CM | POA: Diagnosis not present

## 2023-09-29 DIAGNOSIS — I700 Atherosclerosis of aorta: Secondary | ICD-10-CM | POA: Diagnosis not present

## 2023-10-04 ENCOUNTER — Other Ambulatory Visit: Payer: Self-pay | Admitting: Urology

## 2023-10-04 ENCOUNTER — Other Ambulatory Visit (HOSPITAL_COMMUNITY): Payer: Self-pay | Admitting: *Deleted

## 2023-10-04 ENCOUNTER — Other Ambulatory Visit: Payer: Self-pay

## 2023-10-04 DIAGNOSIS — Z794 Long term (current) use of insulin: Secondary | ICD-10-CM | POA: Diagnosis not present

## 2023-10-04 DIAGNOSIS — N182 Chronic kidney disease, stage 2 (mild): Secondary | ICD-10-CM | POA: Diagnosis not present

## 2023-10-04 DIAGNOSIS — N3000 Acute cystitis without hematuria: Secondary | ICD-10-CM | POA: Diagnosis not present

## 2023-10-04 DIAGNOSIS — E1122 Type 2 diabetes mellitus with diabetic chronic kidney disease: Secondary | ICD-10-CM | POA: Diagnosis not present

## 2023-10-04 DIAGNOSIS — S42202D Unspecified fracture of upper end of left humerus, subsequent encounter for fracture with routine healing: Secondary | ICD-10-CM | POA: Diagnosis not present

## 2023-10-04 DIAGNOSIS — I129 Hypertensive chronic kidney disease with stage 1 through stage 4 chronic kidney disease, or unspecified chronic kidney disease: Secondary | ICD-10-CM | POA: Diagnosis not present

## 2023-10-04 DIAGNOSIS — I700 Atherosclerosis of aorta: Secondary | ICD-10-CM | POA: Diagnosis not present

## 2023-10-04 DIAGNOSIS — R338 Other retention of urine: Secondary | ICD-10-CM | POA: Diagnosis not present

## 2023-10-04 NOTE — Patient Outreach (Signed)
 Complex Care Management   Visit Note  10/04/2023  Name:  Dustin Duffy MRN: [id number] DOB: April 27, 1955  Situation: Referral received for Complex Care Management related to Falls  I obtained verbal consent from Patient.  Visit completed with Dustin Duffy  on the phone. Main concern today: he is taking care of suprapubic catheter, saw [facility] MD today, MD is planning to schedule bladder flush soon.    Background:   Past Medical History:  Diagnosis Date   BPH without urinary obstruction    Diabetes mellitus type 2 in nonobese (HCC)    Dyslipidemia    Essential hypertension    Renal impairment    Visual impairment     Assessment: Patient Reported Symptoms:  Cognitive Cognitive Status: Alert and oriented to person, place, and time, Requires Assistance Decision Making, Normal speech and language skills      Neurological Neurological Review of Symptoms: No symptoms reported    HEENT HEENT Symptoms Reported: No symptoms reported      Cardiovascular Cardiovascular Symptoms Reported: No symptoms reported    Respiratory Respiratory Symptoms Reported: No symptoms reported    Endocrine Endocrine Symptoms Reported:  (A1C 9.3% on 08/16/23, takes Januvia 50mg  once daily) Is patient diabetic?: Yes    Gastrointestinal Gastrointestinal Symptoms Reported: No symptoms reported      Genitourinary Genitourinary Symptoms Reported: No symptoms reported Additional Genitourinary Details: Suprapubic catheter in place, inserted 09/13/23, he saw [facility] today (10/04/23) with plans to flush out his bladder through outpatient, he is unsure of date, awaiting call for appointment.    Integumentary Integumentary Symptoms Reported: No symptoms reported    Musculoskeletal Musculoskelatal Symptoms Reviewed: Difficulty walking Additional Musculoskeletal Details: Using wheelchair, can stand and pivot.   Falls in the past year?: Yes Number of falls in past year: 2 or more Was there an injury  with Fall?: Yes Fall Risk Category Calculator: 3 Patient Fall Risk Level: High Fall Risk Patient at Risk for Falls Due to: History of fall(s)  Psychosocial Psychosocial Symptoms Reported: Not assessed            08/01/2023    3:19 PM  Depression screen PHQ 2/9  Decreased Interest 0  Down, Depressed, Hopeless 1  PHQ - 2 Score 1    There were no vitals filed for this visit.  Medications Reviewed Today   Medications were not reviewed in this encounter     Recommendation:   Specialty provider follow-up :[facility] 10/23/23  -Patient will continue to be mindful of movement, avoiding falls.  -Patient will contact [facility] for signs/symptoms of UTI, if catheter fails to drain, pain at catheter site insertion.   Follow Up Plan:   Telephone follow-up in 1 month  Santana Stamp BSN, CCM [place]  [facility] Population Health RN Care Manager Direct Dial: [phone]  Fax: [phone]

## 2023-10-04 NOTE — Patient Instructions (Signed)
 Visit Information  Thank you for taking time to visit with me today. Please don't hesitate to contact me if I can be of assistance to you before our next scheduled appointment.  Your next care management appointment is by telephone on Wednesday, August 6th 11:30am.    Please call the care guide team at [phone] if you need to cancel, schedule, or reschedule an appointment.   A reminder to ALL patients/family/friends, please call the [place]  National Suicide Prevention Lifeline: (208)736-0030 or TTY: 209-821-0084 TTY 318-058-8217) to talk to a trained counselor if you are experiencing a Mental Health or Behavioral Health Crisis or need someone to talk to.  Santana Stamp BSN, CCM [place]  [facility] Population Health RN Care Manager Direct Dial: [phone]  Fax: [phone]

## 2023-10-05 DIAGNOSIS — I2510 Atherosclerotic heart disease of native coronary artery without angina pectoris: Secondary | ICD-10-CM | POA: Diagnosis not present

## 2023-10-05 DIAGNOSIS — Z7982 Long term (current) use of aspirin: Secondary | ICD-10-CM | POA: Diagnosis not present

## 2023-10-05 DIAGNOSIS — N182 Chronic kidney disease, stage 2 (mild): Secondary | ICD-10-CM | POA: Diagnosis not present

## 2023-10-05 DIAGNOSIS — Z9181 History of falling: Secondary | ICD-10-CM | POA: Diagnosis not present

## 2023-10-05 DIAGNOSIS — Z7984 Long term (current) use of oral hypoglycemic drugs: Secondary | ICD-10-CM | POA: Diagnosis not present

## 2023-10-05 DIAGNOSIS — H548 Legal blindness, as defined in USA: Secondary | ICD-10-CM | POA: Diagnosis not present

## 2023-10-05 DIAGNOSIS — E1122 Type 2 diabetes mellitus with diabetic chronic kidney disease: Secondary | ICD-10-CM | POA: Diagnosis not present

## 2023-10-05 DIAGNOSIS — S42202D Unspecified fracture of upper end of left humerus, subsequent encounter for fracture with routine healing: Secondary | ICD-10-CM | POA: Diagnosis not present

## 2023-10-05 DIAGNOSIS — Z7952 Long term (current) use of systemic steroids: Secondary | ICD-10-CM | POA: Diagnosis not present

## 2023-10-05 DIAGNOSIS — D631 Anemia in chronic kidney disease: Secondary | ICD-10-CM | POA: Diagnosis not present

## 2023-10-05 DIAGNOSIS — I700 Atherosclerosis of aorta: Secondary | ICD-10-CM | POA: Diagnosis not present

## 2023-10-05 DIAGNOSIS — N401 Enlarged prostate with lower urinary tract symptoms: Secondary | ICD-10-CM | POA: Diagnosis not present

## 2023-10-05 DIAGNOSIS — Z993 Dependence on wheelchair: Secondary | ICD-10-CM | POA: Diagnosis not present

## 2023-10-05 DIAGNOSIS — N138 Other obstructive and reflux uropathy: Secondary | ICD-10-CM | POA: Diagnosis not present

## 2023-10-05 DIAGNOSIS — I129 Hypertensive chronic kidney disease with stage 1 through stage 4 chronic kidney disease, or unspecified chronic kidney disease: Secondary | ICD-10-CM | POA: Diagnosis not present

## 2023-10-05 DIAGNOSIS — F32A Depression, unspecified: Secondary | ICD-10-CM | POA: Diagnosis not present

## 2023-10-05 DIAGNOSIS — Z87440 Personal history of urinary (tract) infections: Secondary | ICD-10-CM | POA: Diagnosis not present

## 2023-10-05 DIAGNOSIS — Z794 Long term (current) use of insulin: Secondary | ICD-10-CM | POA: Diagnosis not present

## 2023-10-05 DIAGNOSIS — Z604 Social exclusion and rejection: Secondary | ICD-10-CM | POA: Diagnosis not present

## 2023-10-05 DIAGNOSIS — R296 Repeated falls: Secondary | ICD-10-CM | POA: Diagnosis not present

## 2023-10-05 DIAGNOSIS — J9811 Atelectasis: Secondary | ICD-10-CM | POA: Diagnosis not present

## 2023-10-06 DIAGNOSIS — E1122 Type 2 diabetes mellitus with diabetic chronic kidney disease: Secondary | ICD-10-CM | POA: Diagnosis not present

## 2023-10-06 DIAGNOSIS — I700 Atherosclerosis of aorta: Secondary | ICD-10-CM | POA: Diagnosis not present

## 2023-10-06 DIAGNOSIS — I129 Hypertensive chronic kidney disease with stage 1 through stage 4 chronic kidney disease, or unspecified chronic kidney disease: Secondary | ICD-10-CM | POA: Diagnosis not present

## 2023-10-06 DIAGNOSIS — S42202D Unspecified fracture of upper end of left humerus, subsequent encounter for fracture with routine healing: Secondary | ICD-10-CM | POA: Diagnosis not present

## 2023-10-06 DIAGNOSIS — Z794 Long term (current) use of insulin: Secondary | ICD-10-CM | POA: Diagnosis not present

## 2023-10-06 DIAGNOSIS — N182 Chronic kidney disease, stage 2 (mild): Secondary | ICD-10-CM | POA: Diagnosis not present

## 2023-10-09 DIAGNOSIS — Z794 Long term (current) use of insulin: Secondary | ICD-10-CM | POA: Diagnosis not present

## 2023-10-09 DIAGNOSIS — N182 Chronic kidney disease, stage 2 (mild): Secondary | ICD-10-CM | POA: Diagnosis not present

## 2023-10-09 DIAGNOSIS — E1122 Type 2 diabetes mellitus with diabetic chronic kidney disease: Secondary | ICD-10-CM | POA: Diagnosis not present

## 2023-10-09 DIAGNOSIS — I129 Hypertensive chronic kidney disease with stage 1 through stage 4 chronic kidney disease, or unspecified chronic kidney disease: Secondary | ICD-10-CM | POA: Diagnosis not present

## 2023-10-09 DIAGNOSIS — S42202D Unspecified fracture of upper end of left humerus, subsequent encounter for fracture with routine healing: Secondary | ICD-10-CM | POA: Diagnosis not present

## 2023-10-09 DIAGNOSIS — I700 Atherosclerosis of aorta: Secondary | ICD-10-CM | POA: Diagnosis not present

## 2023-10-10 DIAGNOSIS — N182 Chronic kidney disease, stage 2 (mild): Secondary | ICD-10-CM | POA: Diagnosis not present

## 2023-10-10 DIAGNOSIS — I129 Hypertensive chronic kidney disease with stage 1 through stage 4 chronic kidney disease, or unspecified chronic kidney disease: Secondary | ICD-10-CM | POA: Diagnosis not present

## 2023-10-10 DIAGNOSIS — Z794 Long term (current) use of insulin: Secondary | ICD-10-CM | POA: Diagnosis not present

## 2023-10-10 DIAGNOSIS — S42202D Unspecified fracture of upper end of left humerus, subsequent encounter for fracture with routine healing: Secondary | ICD-10-CM | POA: Diagnosis not present

## 2023-10-10 DIAGNOSIS — E1122 Type 2 diabetes mellitus with diabetic chronic kidney disease: Secondary | ICD-10-CM | POA: Diagnosis not present

## 2023-10-10 DIAGNOSIS — I700 Atherosclerosis of aorta: Secondary | ICD-10-CM | POA: Diagnosis not present

## 2023-10-15 ENCOUNTER — Emergency Department (HOSPITAL_COMMUNITY)
Admission: EM | Admit: 2023-10-15 | Discharge: 2023-10-15 | Disposition: A | Discharge status: Home / Self Care | Source: Other Acute Inpatient Hospital | Attending: Emergency Medicine | Admitting: Emergency Medicine

## 2023-10-15 ENCOUNTER — Other Ambulatory Visit: Payer: Self-pay

## 2023-10-15 DIAGNOSIS — R238 Other skin changes: Secondary | ICD-10-CM | POA: Insufficient documentation

## 2023-10-15 DIAGNOSIS — T83091A Other mechanical complication of indwelling urethral catheter, initial encounter: Secondary | ICD-10-CM | POA: Diagnosis not present

## 2023-10-15 DIAGNOSIS — Y732 Prosthetic and other implants, materials and accessory gastroenterology and urology devices associated with adverse incidents: Secondary | ICD-10-CM | POA: Diagnosis not present

## 2023-10-15 DIAGNOSIS — R000 Tachycardia, unspecified: Secondary | ICD-10-CM | POA: Diagnosis not present

## 2023-10-15 DIAGNOSIS — R030 Elevated blood-pressure reading, without diagnosis of hypertension: Secondary | ICD-10-CM | POA: Insufficient documentation

## 2023-10-15 DIAGNOSIS — R0689 Other abnormalities of breathing: Secondary | ICD-10-CM | POA: Diagnosis not present

## 2023-10-15 DIAGNOSIS — T83010A Breakdown (mechanical) of cystostomy catheter, initial encounter: Secondary | ICD-10-CM | POA: Diagnosis not present

## 2023-10-15 DIAGNOSIS — I10 Essential (primary) hypertension: Secondary | ICD-10-CM | POA: Diagnosis not present

## 2023-10-15 DIAGNOSIS — R300 Dysuria: Secondary | ICD-10-CM | POA: Diagnosis not present

## 2023-10-15 DIAGNOSIS — Z7982 Long term (current) use of aspirin: Secondary | ICD-10-CM | POA: Insufficient documentation

## 2023-10-15 MED ORDER — CLOTRIMAZOLE 1 % EX CREA: TOPICAL_CREAM | CUTANEOUS | 0 refills | Status: DC

## 2023-10-15 MED ORDER — CLOTRIMAZOLE 1 % EX CREA
TOPICAL_CREAM | Freq: Two times a day (BID) | CUTANEOUS | Status: DC
  Filled 2023-10-15: qty 15

## 2023-10-15 NOTE — ED Triage Notes (Signed)
 Pt brought from home for suprapubic catheter problem. Pt states he snagged his cath this morning and has been experiencing leakage all day. Pt complaining of full bladder sensation and discomfort.

## 2023-10-15 NOTE — Discharge Instructions (Addendum)
 It was our pleasure to provide your ER care today - we hope that you feel better.  Avoid tugging on, pulling on, or any tension being applied to your suprapubic urinary catheter.   Keep skin or penis/scrotum/perineal area very clean and dry.  Apply clotrimazole  cream as directed.   Follow up with your urologist in the next 1-2 weeks. Also follow up closely with your primary care doctor in the next 1-2 weeks, including for recheck of your blood pressure that is high today.  Return to ER right away if worse, if catheter not draining into bag, fevers, severe abdominal pain, or other concern.

## 2023-10-15 NOTE — ED Provider Notes (Signed)
 [place] EMERGENCY DEPARTMENT AT [facility] Provider Note   CSN: [id number] Arrival date & time: 10/15/23  1637     Patient presents with: suprapubic catheter problem   Dustin Duffy is a 68 y.o. male.   Pt c/o problems with suprapubic catheter draining. Indicates he was emptying bag this AM, and his thumb accidentally got stuck on tubing and pulled it - indicates it did not come all the way out, but now indicates that some urine is draining around tube but not into bag. Denies fever or chills. Has been eating/drinking. No nv.  No abd pain.   The history is provided by the patient and medical records.       Prior to Admission medications   Medication Sig Start Date End Date Taking? Authorizing Provider  acetaminophen  (TYLENOL ) 325 MG tablet Take 2 tablets (650 mg total) by mouth every 6 (six) hours as needed for mild pain (pain score 1-3) (or Fever >/= 101). 04/15/23   Barbarann Nest, MD  amitriptyline  (ELAVIL ) 50 MG tablet Take 50 mg by mouth at bedtime.    [provider]  aspirin  EC 81 MG tablet Take 81 mg by mouth in the morning. Swallow whole.    [provider]  cholecalciferol (VITAMIN D3) 25 MCG (1000 UNIT) tablet Take 1,000 Units by mouth every Monday, Wednesday, and Friday.    [provider]  fludrocortisone  (FLORINEF ) 0.1 MG tablet Take 1 tablet (0.1 mg total) by mouth daily. 06/09/22   Jillian Buttery, MD  gabapentin  (NEURONTIN ) 300 MG capsule Take 900 mg by mouth at bedtime. 02/05/21   [provider]  ipratropium (ATROVENT ) 0.03 % nasal spray Place 2 sprays into both nostrils 2 (two) times daily as needed for rhinitis.    [provider]  JANUVIA 50 MG tablet Take 50 mg by mouth in the morning.    [provider]  midodrine  (PROAMATINE ) 5 MG tablet Take 1 tablet (5 mg total) by mouth 3 (three) times daily with meals. Patient taking differently: Take 5 mg by mouth See admin instructions. Take 5 mg by  mouth in the morning, at 2 PM, and bedtime 06/09/22   Jillian Buttery, MD  MYRBETRIQ  50 MG TB24 tablet Take 50 mg by mouth in the morning.    [provider]  oxyCODONE  (ROXICODONE ) 5 MG immediate release tablet Take 1 tablet (5 mg total) by mouth every 4 (four) hours as needed for severe pain (pain score 7-10). 06/24/23   Gherghe, Costin M, MD  simvastatin  (ZOCOR ) 20 MG tablet Take 1 tablet (20 mg total) by mouth every evening. Do not take this medication until you finish Diflucan . Patient taking differently: Take 20 mg by mouth at bedtime. 05/02/22   Jadine Toribio SQUIBB, MD  Water For Irrigation, Sterile (STERILE WATER) Irrigate with as directed See admin instructions. Mix sterile water with white vinegar 4:1 and flush indwelling catheter nightly before bedtime    [provider]  XIGDUO XR 10-500 MG TB24 Take 1 tablet by mouth daily before breakfast.    [provider]    Allergies: Patient has no known allergies.    Review of Systems  Constitutional:  Negative for fever.  Respiratory:  Negative for shortness of breath.   Cardiovascular:  Negative for chest pain.  Gastrointestinal:  Negative for abdominal pain, nausea and vomiting.  Genitourinary:  Negative for dysuria, flank pain, scrotal swelling and testicular pain.  Musculoskeletal:  Negative for back pain.    Updated Vital  Signs BP (!) 155/109   Pulse 98   Temp 98.2 F (36.8 C) (Oral)   Resp 18   Ht 1.727 m (5' 8)   Wt 52 kg   SpO2 92%   BMI 17.43 kg/m   Physical Exam Vitals and nursing note reviewed.  Constitutional:      Appearance: Normal appearance. He is well-developed.  HENT:     Head: Atraumatic.     Nose: Nose normal.     Mouth/Throat:     Mouth: Mucous membranes are moist.  Eyes:     General: No scleral icterus.    Conjunctiva/sclera: Conjunctivae normal.  Neck:     Trachea: No tracheal deviation.  Cardiovascular:     Rate and Rhythm: Normal rate and regular rhythm.     Pulses:  Normal pulses.     Heart sounds: Normal heart sounds. No murmur heard.    No friction rub. No gallop.  Pulmonary:     Effort: Pulmonary effort is normal. No accessory muscle usage or respiratory distress.     Breath sounds: Normal breath sounds.  Abdominal:     General: Bowel sounds are normal. There is no distension.     Palpations: Abdomen is soft.     Tenderness: There is no abdominal tenderness. There is no guarding.     Comments: 18 french foley catheter/suprapubic tube - not actively draining, but some urine appears to be draining around tube.  Pt wearingwet adult diapers, mild irritation to skin about penis/scrotum.   Genitourinary:    Comments: No cva tenderness. Normal external gu exam, with exception that sitting in wet adult diaper, mild skin irritation, no cellulitis, mild yeast dermatitis.  Musculoskeletal:        General: No swelling.     Cervical back: Normal range of motion and neck supple. No rigidity.  Skin:    General: Skin is warm and dry.     Findings: No rash.  Neurological:     Mental Status: He is alert.     Comments: Alert, speech clear.   Psychiatric:        Mood and Affect: Mood normal.     (all labs ordered are listed, but only abnormal results are displayed) Labs Reviewed - No data to display  EKG: None  Radiology: No results found.   Procedures   Medications Ordered in the ED - No data to display                                  Medical Decision Making Problems Addressed: Elevated blood pressure reading: acute illness or injury Skin irritation: acute illness or injury Suprapubic catheter dysfunction, initial encounter [facility]): acute illness or injury with systemic symptoms    Details: Acute/recurrent  Amount and/or Complexity of Data Reviewed Independent Historian: EMS    Details: hx External Data Reviewed: notes.  Risk Prescription drug management.   Reviewed nursing notes and prior charts for additional history.  Prior ED visit  noted whereby suprapubic tube not working well then - reportedly had foley placed. Pt then followed up at urology office 7/9 and had penis/foley catheter removed, and suprapubic tube changed.   On exam, it appears pt inadvertently pulled catheter outwards a bit and balloon not fully inflated - catheter advanced, balloon reinflated, and yellow urine draining into new bag. Abdomen soft non tender.   Recheck pt, urine draining into bag, no urine draining around catheter. No  abdominal pain or tenderness.  Hr 80, rr 16.   Staff to affix suprapubic cath so that it is not under tension at skin.   Pericare, clotrimazole  cream applied.   Pt currently appears stable for ED d/c.  Rec close urology/pcp follow up.   Return precautions provided.          Final diagnoses:  None    ED Discharge Orders     None          Bernard Drivers, MD 10/15/23 1750

## 2023-10-16 ENCOUNTER — Other Ambulatory Visit: Payer: Self-pay | Admitting: Licensed Clinical Social Worker

## 2023-10-16 NOTE — Patient Outreach (Signed)
 Complex Care Management   Visit Note  10/16/2023  Name:  Dustin Duffy MRN: [id number] DOB: 1955-11-13  Situation: Referral received for Complex Care Management related to Level of Care I obtained verbal consent from Patient.  Visit completed with pt  on the phone  Background:   Past Medical History:  Diagnosis Date   BPH without urinary obstruction    Diabetes mellitus type 2 in nonobese (HCC)    Dyslipidemia    Essential hypertension    Renal impairment    Visual impairment     Assessment: Patient Reported Symptoms:  Cognitive Cognitive Status: Alert and oriented to person, place, and time      Neurological Neurological Review of Symptoms: Not assessed    HEENT HEENT Symptoms Reported: Not assessed      Cardiovascular Cardiovascular Symptoms Reported: Not assessed    Respiratory Respiratory Symptoms Reported: Not assesed    Endocrine Endocrine Symptoms Reported: Not assessed    Gastrointestinal Gastrointestinal Symptoms Reported: Not assessed      Genitourinary Genitourinary Symptoms Reported: No symptoms reported Additional Genitourinary Details: Patient reports he visited ED on 7/20 to have catheter adjusted. Denies any current concerns; however, has procedure scheduled for next week Genitourinary Management Strategies: Catheter, indwelling  Integumentary Integumentary Symptoms Reported: Wound Additional Integumentary Details: Patient reports his wound on right heel has healed; however, continues to treat wound on back Skin Management Strategies: Dressing changes  Musculoskeletal Musculoskelatal Symptoms Reviewed: Not assessed        Psychosocial Psychosocial Symptoms Reported: No symptoms reported Additional Psychological Details: Patient reports he receives strong support from family and aid agency Behavioral Management Strategies: Coping strategies, Support system Major Change/Loss/Stressor/Fears (CP): Medical condition, self        08/01/2023    3:19 PM   Depression screen PHQ 2/9  Decreased Interest 0  Down, Depressed, Hopeless 1  PHQ - 2 Score 1    There were no vitals filed for this visit.  Medications Reviewed Today     Reviewed by Freddie Dymek D, LCSW (Social Worker) on 10/16/23 at 1115  Med List Status: <None>   Medication Order Taking? Sig Documenting Provider Last Dose Status Informant  acetaminophen  (TYLENOL ) 325 MG tablet [id number] No Take 2 tablets (650 mg total) by mouth every 6 (six) hours as needed for mild pain (pain score 1-3) (or Fever >/= 101). Barbarann Nest, MD Past Month Active Care Giver  amitriptyline  (ELAVIL ) 50 MG tablet [id number] No Take 50 mg by mouth at bedtime. [provider] 09/12/2023 Active Care Giver  aspirin  EC 81 MG tablet [id number] No Take 81 mg by mouth in the morning. Swallow whole. [provider] 09/05/2023 Active Care Giver  cholecalciferol (VITAMIN D3) 25 MCG (1000 UNIT) tablet [id number] No Take 1,000 Units by mouth every Monday, Wednesday, and Friday. [provider] Past Week Active Care Giver  clotrimazole  (LOTRIMIN ) 1 % cream [id number]  Keep skin of perineal area very clean/dry. Apply thin coat of clotrimazole  cream to area 2x/day for one week. Steinl, Kevin, MD  Active   fludrocortisone  (FLORINEF ) 0.1 MG tablet [id number] No Take 1 tablet (0.1 mg total) by mouth daily. Jillian Buttery, MD 09/12/2023 Active Care Giver  gabapentin  (NEURONTIN ) 300 MG capsule [id number] No Take 900 mg by mouth at bedtime. [provider] 09/12/2023 Active Care Giver           Med Note MARISA, NATHANEL SAILOR   Tue Jun 20, 2023  9:53 PM)    ipratropium (ATROVENT ) 0.03 % nasal  spray [id number] No Place 2 sprays into both nostrils 2 (two) times daily as needed for rhinitis. [provider] Past Month Active Care Giver           Med Note MARISA, NATHANEL SAILOR   Tue Jun 20, 2023  9:54 PM)    JANUVIA 50 MG tablet [id number]  Take 50 mg by mouth in the morning. [provider]  Active  Care Giver  midodrine  (PROAMATINE ) 5 MG tablet [id number] No Take 1 tablet (5 mg total) by mouth 3 (three) times daily with meals.  Patient taking differently: Take 5 mg by mouth See admin instructions. Take 5 mg by mouth in the morning, at 2 PM, and bedtime   Jillian Buttery, MD 09/12/2023 Active Care Giver  MYRBETRIQ  50 MG TB24 tablet [id number] No Take 50 mg by mouth in the morning. [provider] 09/12/2023 Active Care Giver  oxyCODONE  (ROXICODONE ) 5 MG immediate release tablet [id number] No Take 1 tablet (5 mg total) by mouth every 4 (four) hours as needed for severe pain (pain score 7-10). Trixie Nilda HERO, MD Taking Active   simvastatin  (ZOCOR ) 20 MG tablet [id number] No Take 1 tablet (20 mg total) by mouth every evening. Do not take this medication until you finish Diflucan .  Patient taking differently: Take 20 mg by mouth at bedtime.   Jadine Toribio SQUIBB, MD 09/12/2023 Active Care Giver  Water For Irrigation, Sterile (STERILE WATER) [id number] No Irrigate with as directed See admin instructions. Mix sterile water with white vinegar 4:1 and flush indwelling catheter nightly before bedtime [provider] Taking Active Care Giver  XIGDUO XR 10-500 MG TB24 [id number] No Take 1 tablet by mouth daily before breakfast. [provider] 09/12/2023 Active Care Giver            Recommendation:   Continue Current Plan of Care  Follow Up Plan:   Closing From:  LCSW Complex Care Management  Rolin Kerns, LCSW [place]  [facility], [facility] Clinical Social Worker Direct Dial: [phone]  Fax: [phone] Website: [email or web address] 11:25 AM

## 2023-10-16 NOTE — Patient Instructions (Signed)
 Visit Information  Thank you for taking time to visit with me today. Please don't hesitate to contact me if I can be of assistance to you before our next scheduled appointment.   Closing From: LCSW-Complex Care Management  Please call the care guide team at [phone] if you need to cancel, schedule, or reschedule an appointment.   Please call the Suicide and Crisis Lifeline: 988 go to [facility] [address] [place] [phone]) call 911 if you are experiencing a Mental Health or Behavioral Health Crisis or need someone to talk to.  Rolin Kerns, LCSW   [facility], [facility] Clinical Social Worker Direct Dial: [phone]  Fax: [phone] Website: [email or web address] 11:24 AM

## 2023-10-17 DIAGNOSIS — I129 Hypertensive chronic kidney disease with stage 1 through stage 4 chronic kidney disease, or unspecified chronic kidney disease: Secondary | ICD-10-CM | POA: Diagnosis not present

## 2023-10-17 DIAGNOSIS — I700 Atherosclerosis of aorta: Secondary | ICD-10-CM | POA: Diagnosis not present

## 2023-10-17 DIAGNOSIS — E1122 Type 2 diabetes mellitus with diabetic chronic kidney disease: Secondary | ICD-10-CM | POA: Diagnosis not present

## 2023-10-17 DIAGNOSIS — Z794 Long term (current) use of insulin: Secondary | ICD-10-CM | POA: Diagnosis not present

## 2023-10-17 DIAGNOSIS — S42202D Unspecified fracture of upper end of left humerus, subsequent encounter for fracture with routine healing: Secondary | ICD-10-CM | POA: Diagnosis not present

## 2023-10-17 DIAGNOSIS — N182 Chronic kidney disease, stage 2 (mild): Secondary | ICD-10-CM | POA: Diagnosis not present

## 2023-10-18 NOTE — Patient Instructions (Addendum)
 SURGICAL WAITING ROOM VISITATION Patients having surgery or a procedure may have no more than 2 support people in the waiting area - these visitors may rotate in the visitor waiting room.   Due to an increase in RSV and influenza rates and associated hospitalizations, children ages 63 and under may not visit patients in [facility] hospitals. If the patient needs to stay at the hospital during part of their recovery, the visitor guidelines for inpatient rooms apply.  PRE-OP VISITATION  Pre-op nurse will coordinate an appropriate time for 1 support person to accompany the patient in pre-op.  This support person may not rotate.  This visitor will be contacted when the time is appropriate for the visitor to come back in the pre-op area.  Please refer to the [facility] website for the visitor guidelines for Inpatients (after your surgery is over and you are in a regular room).  You are not required to quarantine at this time prior to your surgery. However, you must do this: Hand Hygiene often Do NOT share personal items Notify your provider if you are in close contact with someone who has COVID or you develop fever 100.4 or greater, new onset of sneezing, cough, sore throat, shortness of breath or body aches.  If you test positive for Covid or have been in contact with anyone that has tested positive in the last 10 days please notify you surgeon.    Your procedure is scheduled on:  10/23/23  Report to [facility] Main Entrance: [place] entrance where the [employer] is available.   Report to admitting at: 1:00 PM  Call this number if you have any questions or problems the morning of surgery [phone]  FOLLOW ANY ADDITIONAL PRE OP INSTRUCTIONS YOU RECEIVED FROM YOUR SURGEON'S OFFICE!!!  Do not eat food after Midnight the night prior to your surgery/procedure.  After Midnight you may have the following liquids until : 12:00 PM DAY OF SURGERY  Clear Liquid Diet Water Black  Coffee (sugar ok, NO MILK/CREAM OR CREAMERS)  Tea (sugar ok, NO MILK/CREAM OR CREAMERS) regular and decaf                             Plain Jell-O  with no fruit (NO RED)                                           Fruit ices (not with fruit pulp, NO RED)                                     Popsicles (NO RED)                                                                  Juice: NO CITRUS JUICES: only apple, WHITE grape, WHITE cranberry Sports drinks like Gatorade or Powerade (NO RED)    Oral Hygiene is also important to reduce your risk of infection.        Remember - BRUSH YOUR TEETH THE MORNING OF SURGERY WITH YOUR  REGULAR TOOTHPASTE  Do NOT smoke after Midnight the night before surgery.  STOP TAKING all Vitamins, Herbs and supplements 1 week before your surgery.   Take ONLY these medicines the morning of surgery with A SIP OF WATER: fludrocortisone ,myrbetriq .                  How to Manage Your Diabetes Before and After Surgery  Why is it important to control my blood sugar before and after surgery? Improving blood sugar levels before and after surgery helps healing and can limit problems. A way of improving blood sugar control is eating a healthy diet by:  Eating less sugar and carbohydrates  Increasing activity/exercise  Talking with your doctor about reaching your blood sugar goals High blood sugars (greater than 180 mg/dL) can raise your risk of infections and slow your recovery, so you will need to focus on controlling your diabetes during the weeks before surgery. Make sure that the doctor who takes care of your diabetes knows about your planned surgery including the date and location.  How do I manage my blood sugar before surgery? Check your blood sugar at least 4 times a day, starting 2 days before surgery, to make sure that the level is not too high or low. Check your blood sugar the morning of your surgery when you wake up and every 2 hours until you get to the Short Stay  unit. If your blood sugar is less than 70 mg/dL, you will need to treat for low blood sugar: Do not take insulin . Treat a low blood sugar (less than 70 mg/dL) with  cup of clear juice (cranberry or apple), 4 glucose tablets, OR glucose gel. Recheck blood sugar in 15 minutes after treatment (to make sure it is greater than 70 mg/dL). If your blood sugar is not greater than 70 mg/dL on recheck, call [phone] for further instructions. Report your blood sugar to the short stay nurse when you get to Short Stay.  If you are admitted to the hospital after surgery: Your blood sugar will be checked by the staff and you will probably be given insulin  after surgery (instead of oral diabetes medicines) to make sure you have good blood sugar levels. The goal for blood sugar control after surgery is 80-180 mg/dL.   WHAT DO I DO ABOUT MY DIABETES MEDICATION?  HOLD Xigduo after: 10/19/23  THE MORNING OF SURGERY, take ONLY half of lantus insulin  dose ( 6 units). DO NOT take oral diabetes medicines (pills) the morning of surgery  You may not have any metal on your body including hair pins, jewelry, and body piercing  Do not wear lotions, powders, perfumes / cologne, or deodorant   Men may shave face and neck.  Contacts, Hearing Aids, dentures or bridgework may not be worn into surgery. DENTURES WILL BE REMOVED PRIOR TO SURGERY PLEASE DO NOT APPLY Poly grip OR ADHESIVES!!!  You may bring a small overnight bag with you on the day of surgery, only pack items that are not valuable. [facility] IS NOT RESPONSIBLE   FOR VALUABLES THAT ARE LOST OR STOLEN.   Patients discharged on the day of surgery will not be allowed to drive home.  Someone NEEDS to stay with you for the first 24 hours after anesthesia.  Do not bring your home medications to the hospital. The Pharmacy will dispense medications listed on your medication list to you during your admission in the Hospital.  Special Instructions: Bring a  copy of your  healthcare power of attorney and living will documents the day of surgery, if you wish to have them scanned into your [place] [facility] Records- EPIC  Please read over the following fact sheets you were given: IF YOU HAVE QUESTIONS ABOUT YOUR PRE-OP INSTRUCTIONS, PLEASE CALL [phone]   [facility] - Preparing for Surgery Before surgery, you can play an important role.  Because skin is not sterile, your skin needs to be as free of germs as possible.  You can reduce the number of germs on your skin by washing with CHG (chlorahexidine gluconate) soap before surgery.  CHG is an antiseptic cleaner which kills germs and bonds with the skin to continue killing germs even after washing. Please DO NOT use if you have an allergy to CHG or antibacterial soaps.  If your skin becomes reddened/irritated stop using the CHG and inform your nurse when you arrive at Short Stay. Do not shave (including legs and underarms) for at least 48 hours prior to the first CHG shower.  You may shave your face/neck.  Please follow these instructions carefully:  1.  Shower with CHG Soap the night before surgery and the  morning of surgery.  2.  If you choose to wash your hair, wash your hair first as usual with your normal  shampoo.  3.  After you shampoo, rinse your hair and body thoroughly to remove the shampoo.                             4.  Use CHG as you would any other liquid soap.  You can apply chg directly to the skin and wash.  Gently with a scrungie or clean washcloth.  5.  Apply the CHG Soap to your body ONLY FROM THE NECK DOWN.   Do not use on face/ open                           Wound or open sores. Avoid contact with eyes, ears mouth and genitals (private parts).                       Wash face,  Genitals (private parts) with your normal soap.             6.  Wash thoroughly, paying special attention to the area where your  surgery  will be performed.  7.  Thoroughly rinse your body with warm  water from the neck down.  8.  DO NOT shower/wash with your normal soap after using and rinsing off the CHG Soap.            9.  Pat yourself dry with a clean towel.            10.  Wear clean pajamas.            11.  Place clean sheets on your bed the night of your first shower and do not  sleep with pets.  ON THE DAY OF SURGERY : Do not apply any lotions/deodorants the morning of surgery.  Please wear clean clothes to the hospital/surgery center.     FAILURE TO FOLLOW THESE INSTRUCTIONS MAY RESULT IN THE CANCELLATION OF YOUR SURGERY  PATIENT SIGNATURE_________________________________  NURSE SIGNATURE__________________________________  ________________________________________________________________________

## 2023-10-19 ENCOUNTER — Encounter (HOSPITAL_COMMUNITY): Payer: Self-pay

## 2023-10-19 ENCOUNTER — Other Ambulatory Visit: Payer: Self-pay

## 2023-10-19 ENCOUNTER — Encounter (HOSPITAL_COMMUNITY)
Admission: RE | Admit: 2023-10-19 | Discharge: 2023-10-19 | Disposition: A | Discharge status: Home / Self Care | Source: Ambulatory Visit | Attending: Urology | Admitting: Urology

## 2023-10-19 VITALS — BP 122/77 | HR 102 | Temp 98.4°F | Ht 68.0 in | Wt 110.0 lb

## 2023-10-19 DIAGNOSIS — I10 Essential (primary) hypertension: Secondary | ICD-10-CM | POA: Diagnosis not present

## 2023-10-19 DIAGNOSIS — I129 Hypertensive chronic kidney disease with stage 1 through stage 4 chronic kidney disease, or unspecified chronic kidney disease: Secondary | ICD-10-CM | POA: Diagnosis not present

## 2023-10-19 DIAGNOSIS — Z01812 Encounter for preprocedural laboratory examination: Secondary | ICD-10-CM | POA: Insufficient documentation

## 2023-10-19 DIAGNOSIS — E119 Type 2 diabetes mellitus without complications: Secondary | ICD-10-CM | POA: Insufficient documentation

## 2023-10-19 DIAGNOSIS — I700 Atherosclerosis of aorta: Secondary | ICD-10-CM | POA: Diagnosis not present

## 2023-10-19 DIAGNOSIS — E1122 Type 2 diabetes mellitus with diabetic chronic kidney disease: Secondary | ICD-10-CM | POA: Diagnosis not present

## 2023-10-19 DIAGNOSIS — Z794 Long term (current) use of insulin: Secondary | ICD-10-CM | POA: Diagnosis not present

## 2023-10-19 DIAGNOSIS — N182 Chronic kidney disease, stage 2 (mild): Secondary | ICD-10-CM | POA: Diagnosis not present

## 2023-10-19 DIAGNOSIS — S42202D Unspecified fracture of upper end of left humerus, subsequent encounter for fracture with routine healing: Secondary | ICD-10-CM | POA: Diagnosis not present

## 2023-10-19 HISTORY — DX: Neuromuscular dysfunction of bladder, unspecified: N31.9

## 2023-10-19 HISTORY — DX: Pneumonia, unspecified organism: J18.9

## 2023-10-19 HISTORY — DX: Polyneuropathy, unspecified: G62.9

## 2023-10-19 HISTORY — DX: Depression, unspecified: F32.A

## 2023-10-19 LAB — HEMOGLOBIN A1C
Hgb A1c MFr Bld: 8.9 % — ABNORMAL HIGH (ref 4.8–5.6)
Mean Plasma Glucose: 208.73 mg/dL

## 2023-10-19 LAB — CBC
HCT: 39.4 % (ref 39.0–52.0)
Hemoglobin: 12.1 g/dL — ABNORMAL LOW (ref 13.0–17.0)
MCH: 24.7 pg — ABNORMAL LOW (ref 26.0–34.0)
MCHC: 30.7 g/dL (ref 30.0–36.0)
MCV: 80.4 fL (ref 80.0–100.0)
Platelets: 515 K/uL — ABNORMAL HIGH (ref 150–400)
RBC: 4.9 MIL/uL (ref 4.22–5.81)
RDW: 17.2 % — ABNORMAL HIGH (ref 11.5–15.5)
WBC: 14.4 K/uL — ABNORMAL HIGH (ref 4.0–10.5)
nRBC: 0 % (ref 0.0–0.2)

## 2023-10-19 LAB — BASIC METABOLIC PANEL WITH GFR
Anion gap: 13 (ref 5–15)
BUN: 27 mg/dL — ABNORMAL HIGH (ref 8–23)
CO2: 27 mmol/L (ref 22–32)
Calcium: 9.1 mg/dL (ref 8.9–10.3)
Chloride: 97 mmol/L — ABNORMAL LOW (ref 98–111)
Creatinine, Ser: 1.59 mg/dL — ABNORMAL HIGH (ref 0.61–1.24)
GFR, Estimated: 46 mL/min — ABNORMAL LOW (ref >60)
Glucose, Bld: 133 mg/dL — ABNORMAL HIGH (ref 70–99)
Potassium: 4.1 mmol/L (ref 3.5–5.1)
Sodium: 137 mmol/L (ref 135–145)

## 2023-10-19 LAB — GLUCOSE, CAPILLARY: Glucose-Capillary: 130 mg/dL — ABNORMAL HIGH (ref 70–99)

## 2023-10-19 NOTE — Progress Notes (Addendum)
 For Anesthesia: PCP - Gerome Brunet, DO  Cardiologist - N/A  Bowel Prep reminder:N/A  Chest x-ray - 06/25/23 EKG - 06/23/23 Stress Test -  ECHO - 02/12/21 Cardiac Cath -  Pacemaker/ICD device last checked: Pacemaker orders received: Device Rep notified:  Spinal Cord Stimulator:N/A  Sleep Study - N/A CPAP -   Fasting Blood Sugar - 100's Checks Blood Sugar : continuous libre 2 monitor Date and result of last Hgb A1c-8.0: 04/12/23  Last dose of GLP1 agonist- N/A GLP1 instructions:   Last dose of SGLT-2 inhibitors- Xigduo SGLT-2 instructions: To hold it after: 10/19/23  Blood Thinner Instructions:N/A Aspirin  Instructions: ON HOLD. Last Dose:  Activity level:    Unable to go up a flight of stairs due to weakness,pt. Uses wheel chair.    Anesthesia review: Hx: HTN,DIA. Pt. Has two wounds on his heels and a decubitus wound on the sacrum area, reported by the pt.He gets wound care at home and PT.  Patient denies shortness of breath, fever, cough and chest pain at PAT appointment   Patient verbalized understanding of instructions that were reviewed over the telephone.

## 2023-10-19 NOTE — Progress Notes (Signed)
Lab. Results A1C: 8.9 

## 2023-10-20 DIAGNOSIS — N401 Enlarged prostate with lower urinary tract symptoms: Secondary | ICD-10-CM | POA: Diagnosis not present

## 2023-10-20 DIAGNOSIS — H548 Legal blindness, as defined in USA: Secondary | ICD-10-CM | POA: Diagnosis not present

## 2023-10-20 DIAGNOSIS — E1122 Type 2 diabetes mellitus with diabetic chronic kidney disease: Secondary | ICD-10-CM | POA: Diagnosis not present

## 2023-10-20 DIAGNOSIS — R2689 Other abnormalities of gait and mobility: Secondary | ICD-10-CM | POA: Diagnosis not present

## 2023-10-20 DIAGNOSIS — H4720 Unspecified optic atrophy: Secondary | ICD-10-CM | POA: Diagnosis not present

## 2023-10-20 DIAGNOSIS — Z7189 Other specified counseling: Secondary | ICD-10-CM | POA: Diagnosis not present

## 2023-10-20 DIAGNOSIS — Z9359 Other cystostomy status: Secondary | ICD-10-CM | POA: Diagnosis not present

## 2023-10-20 DIAGNOSIS — B3741 Candidal cystitis and urethritis: Secondary | ICD-10-CM | POA: Diagnosis not present

## 2023-10-20 DIAGNOSIS — G5711 Meralgia paresthetica, right lower limb: Secondary | ICD-10-CM | POA: Diagnosis not present

## 2023-10-20 DIAGNOSIS — F02A18 Dementia in other diseases classified elsewhere, mild, with other behavioral disturbance: Secondary | ICD-10-CM | POA: Diagnosis not present

## 2023-10-20 DIAGNOSIS — E1142 Type 2 diabetes mellitus with diabetic polyneuropathy: Secondary | ICD-10-CM | POA: Diagnosis not present

## 2023-10-23 ENCOUNTER — Encounter (HOSPITAL_COMMUNITY): Payer: Self-pay | Admitting: Urology

## 2023-10-23 ENCOUNTER — Ambulatory Visit (HOSPITAL_COMMUNITY)
Admission: RE | Admit: 2023-10-23 | Discharge: 2023-10-23 | Disposition: A | Discharge status: Home / Self Care | Attending: Urology | Admitting: Urology

## 2023-10-23 ENCOUNTER — Other Ambulatory Visit: Payer: Self-pay

## 2023-10-23 ENCOUNTER — Ambulatory Visit (HOSPITAL_COMMUNITY): Payer: Self-pay | Admitting: Physician Assistant

## 2023-10-23 ENCOUNTER — Encounter (HOSPITAL_COMMUNITY)
Admission: RE | Disposition: A | Discharge status: Home / Self Care | Payer: Self-pay | Source: Home / Self Care | Attending: Urology

## 2023-10-23 ENCOUNTER — Ambulatory Visit (HOSPITAL_COMMUNITY): Payer: Self-pay | Admitting: Anesthesiology

## 2023-10-23 DIAGNOSIS — I10 Essential (primary) hypertension: Secondary | ICD-10-CM | POA: Diagnosis not present

## 2023-10-23 DIAGNOSIS — N323 Diverticulum of bladder: Secondary | ICD-10-CM | POA: Insufficient documentation

## 2023-10-23 DIAGNOSIS — R339 Retention of urine, unspecified: Secondary | ICD-10-CM | POA: Diagnosis not present

## 2023-10-23 DIAGNOSIS — E1140 Type 2 diabetes mellitus with diabetic neuropathy, unspecified: Secondary | ICD-10-CM | POA: Insufficient documentation

## 2023-10-23 DIAGNOSIS — E785 Hyperlipidemia, unspecified: Secondary | ICD-10-CM | POA: Diagnosis not present

## 2023-10-23 DIAGNOSIS — N401 Enlarged prostate with lower urinary tract symptoms: Secondary | ICD-10-CM | POA: Insufficient documentation

## 2023-10-23 DIAGNOSIS — T83018A Breakdown (mechanical) of other indwelling urethral catheter, initial encounter: Secondary | ICD-10-CM | POA: Diagnosis not present

## 2023-10-23 DIAGNOSIS — Z87891 Personal history of nicotine dependence: Secondary | ICD-10-CM | POA: Insufficient documentation

## 2023-10-23 DIAGNOSIS — I4510 Unspecified right bundle-branch block: Secondary | ICD-10-CM | POA: Diagnosis not present

## 2023-10-23 DIAGNOSIS — E119 Type 2 diabetes mellitus without complications: Secondary | ICD-10-CM | POA: Diagnosis not present

## 2023-10-23 DIAGNOSIS — Z794 Long term (current) use of insulin: Secondary | ICD-10-CM | POA: Diagnosis not present

## 2023-10-23 DIAGNOSIS — R338 Other retention of urine: Secondary | ICD-10-CM | POA: Insufficient documentation

## 2023-10-23 DIAGNOSIS — B3741 Candidal cystitis and urethritis: Secondary | ICD-10-CM | POA: Diagnosis not present

## 2023-10-23 DIAGNOSIS — E1165 Type 2 diabetes mellitus with hyperglycemia: Secondary | ICD-10-CM | POA: Diagnosis not present

## 2023-10-23 DIAGNOSIS — Z7984 Long term (current) use of oral hypoglycemic drugs: Secondary | ICD-10-CM | POA: Diagnosis not present

## 2023-10-23 DIAGNOSIS — B379 Candidiasis, unspecified: Secondary | ICD-10-CM | POA: Diagnosis not present

## 2023-10-23 DIAGNOSIS — N319 Neuromuscular dysfunction of bladder, unspecified: Secondary | ICD-10-CM | POA: Diagnosis not present

## 2023-10-23 DIAGNOSIS — Y738 Miscellaneous gastroenterology and urology devices associated with adverse incidents, not elsewhere classified: Secondary | ICD-10-CM | POA: Insufficient documentation

## 2023-10-23 DIAGNOSIS — T191XXA Foreign body in bladder, initial encounter: Secondary | ICD-10-CM | POA: Diagnosis not present

## 2023-10-23 DIAGNOSIS — Z960 Presence of urogenital implants: Secondary | ICD-10-CM | POA: Diagnosis not present

## 2023-10-23 DIAGNOSIS — Z79899 Other long term (current) drug therapy: Secondary | ICD-10-CM | POA: Insufficient documentation

## 2023-10-23 DIAGNOSIS — F32A Depression, unspecified: Secondary | ICD-10-CM | POA: Diagnosis not present

## 2023-10-23 HISTORY — PX: BLADDER INSTILLATION: SHX6893

## 2023-10-23 HISTORY — PX: CYSTOSCOPY: SHX5120

## 2023-10-23 LAB — GLUCOSE, CAPILLARY
Glucose-Capillary: 182 mg/dL — ABNORMAL HIGH (ref 70–99)
Glucose-Capillary: 83 mg/dL (ref 70–99)
Glucose-Capillary: 54 mg/dL — ABNORMAL LOW (ref 70–99)
Glucose-Capillary: 66 mg/dL — ABNORMAL LOW (ref 70–99)
Glucose-Capillary: 87 mg/dL (ref 70–99)

## 2023-10-23 SURGERY — CYSTOSCOPY
Anesthesia: General | Site: Pelvis

## 2023-10-23 MED ORDER — ACETAMINOPHEN 500 MG PO TABS
1000.0000 mg | ORAL_TABLET | Freq: Once | ORAL | Status: AC
  Administered 2023-10-23: 1000 mg via ORAL
  Filled 2023-10-23: qty 2

## 2023-10-23 MED ORDER — CEFAZOLIN SODIUM-DEXTROSE 2-3 GM-%(50ML) IV SOLR
INTRAVENOUS | Status: DC | PRN
  Administered 2023-10-23: 2 g via INTRAVENOUS

## 2023-10-23 MED ORDER — OXYCODONE HCL 5 MG/5ML PO SOLN: 5.0000 mg | Freq: Once | ORAL | Status: DC | PRN

## 2023-10-23 MED ORDER — LACTATED RINGERS IV SOLN: INTRAVENOUS | Status: DC

## 2023-10-23 MED ORDER — PROPOFOL 10 MG/ML IV BOLUS
INTRAVENOUS | Status: AC
  Filled 2023-10-23: qty 20

## 2023-10-23 MED ORDER — AMISULPRIDE (ANTIEMETIC) 5 MG/2ML IV SOLN: 10.0000 mg | Freq: Once | INTRAVENOUS | Status: DC | PRN

## 2023-10-23 MED ORDER — ORAL CARE MOUTH RINSE: 15.0000 mL | Freq: Once | OROMUCOSAL | Status: AC

## 2023-10-23 MED ORDER — FENTANYL CITRATE PF 50 MCG/ML IJ SOSY: 25.0000 ug | PREFILLED_SYRINGE | INTRAMUSCULAR | Status: DC | PRN

## 2023-10-23 MED ORDER — ONDANSETRON HCL 4 MG/2ML IJ SOLN
INTRAMUSCULAR | Status: AC
  Filled 2023-10-23: qty 2

## 2023-10-23 MED ORDER — FLUCONAZOLE 200 MG PO TABS: 200.0000 mg | ORAL_TABLET | Freq: Every day | ORAL | 0 refills | Status: DC

## 2023-10-23 MED ORDER — CHLORHEXIDINE GLUCONATE 0.12 % MT SOLN
15.0000 mL | Freq: Once | OROMUCOSAL | Status: AC
  Administered 2023-10-23: 15 mL via OROMUCOSAL

## 2023-10-23 MED ORDER — ONDANSETRON HCL 4 MG/2ML IJ SOLN
INTRAMUSCULAR | Status: DC | PRN
  Administered 2023-10-23: 4 mg via INTRAVENOUS

## 2023-10-23 MED ORDER — FLUCONAZOLE 200 MG PO TABS
200.0000 mg | ORAL_TABLET | Freq: Once | ORAL | Status: DC
  Filled 2023-10-23: qty 1

## 2023-10-23 MED ORDER — OXYCODONE HCL 5 MG PO TABS: 5.0000 mg | ORAL_TABLET | Freq: Once | ORAL | Status: DC | PRN

## 2023-10-23 MED ORDER — STERILE WATER FOR IRRIGATION IR SOLN
Status: DC | PRN
  Administered 2023-10-23: 3000 mL

## 2023-10-23 MED ORDER — SODIUM CHLORIDE 0.9 % IR SOLN
Freq: Once | Status: AC
  Filled 2023-10-23: qty 500

## 2023-10-23 MED ORDER — CEFAZOLIN SODIUM 1 G IJ SOLR
INTRAMUSCULAR | Status: AC
  Filled 2023-10-23: qty 20

## 2023-10-23 MED ORDER — LIDOCAINE HCL (PF) 2 % IJ SOLN
INTRAMUSCULAR | Status: AC
  Filled 2023-10-23: qty 5

## 2023-10-23 MED ORDER — INSULIN ASPART 100 UNIT/ML IJ SOLN
0.0000 [IU] | INTRAMUSCULAR | Status: DC | PRN
  Administered 2023-10-23: 2 [IU] via SUBCUTANEOUS
  Filled 2023-10-23: qty 1

## 2023-10-23 MED ORDER — PROPOFOL 500 MG/50ML IV EMUL
INTRAVENOUS | Status: DC | PRN
  Administered 2023-10-23: 100 mg via INTRAVENOUS

## 2023-10-23 MED ORDER — PHENYLEPHRINE 80 MCG/ML (10ML) SYRINGE FOR IV PUSH (FOR BLOOD PRESSURE SUPPORT)
PREFILLED_SYRINGE | INTRAVENOUS | Status: DC | PRN
  Administered 2023-10-23 (×6): 80 ug via INTRAVENOUS

## 2023-10-23 MED ORDER — SODIUM CHLORIDE 0.9 % IR SOLN
Status: DC | PRN
  Administered 2023-10-23: 6000 mL via INTRAVESICAL

## 2023-10-23 MED ORDER — LIDOCAINE HCL (PF) 2 % IJ SOLN
INTRAMUSCULAR | Status: DC | PRN
  Administered 2023-10-23: 60 mg via INTRADERMAL

## 2023-10-23 SURGICAL SUPPLY — 13 items
BAG URO CATCHER STRL LF (MISCELLANEOUS) ×2 IMPLANT
CLOTH BEACON ORANGE TIMEOUT ST (SAFETY) ×2 IMPLANT
GLOVE BIO SURGEON STRL SZ7.5 (GLOVE) ×2 IMPLANT
GOWN STRL REUS W/ TWL XL LVL3 (GOWN DISPOSABLE) ×2 IMPLANT
KIT TURNOVER KIT A (KITS) ×2 IMPLANT
MANIFOLD NEPTUNE II (INSTRUMENTS) ×2 IMPLANT
NDL ASPIRATION 22 (NEEDLE) ×2 IMPLANT
NDL SAFETY ECLIPSE 18X1.5 (NEEDLE) IMPLANT
NEEDLE ASPIRATION 22 (NEEDLE) ×2 IMPLANT
PACK CYSTO (CUSTOM PROCEDURE TRAY) ×2 IMPLANT
SYR CONTROL 10ML LL (SYRINGE) IMPLANT
TUBING CONNECTING 10 (TUBING) IMPLANT
WATER STERILE IRR 3000ML UROMA (IV SOLUTION) ×2 IMPLANT

## 2023-10-23 NOTE — Interval H&P Note (Signed)
 Seen and examined today. Consent signed. Proceed with cystoscopy with bladder wash and installation of amphotericin B

## 2023-10-23 NOTE — Anesthesia Preprocedure Evaluation (Addendum)
 Anesthesia Evaluation  Patient identified by MRN, date of birth, ID band Patient awake    Reviewed: Allergy & Precautions, NPO status , Patient's Chart, lab work & pertinent test results  History of Anesthesia Complications Negative for: history of anesthetic complications  Airway Mallampati: III  TM Distance: >3 FB Neck ROM: Full   Comment: Previous grade I view with MAC 4 Dental  (+) Dental Advisory Given, Missing 3 teeth on the top, 2 on the bottom:   Pulmonary neg shortness of breath, neg sleep apnea, neg COPD, neg recent URI, former smoker   Pulmonary exam normal breath sounds clear to auscultation       Cardiovascular hypertension (issues with hypotension now, takes midodrine  for SBP <90), (-) angina (-) Past MI, (-) Cardiac Stents and (-) CABG + dysrhythmias (RBBB)  Rhythm:Regular Rate:Normal  HLD  On midodrine   TTE 02/12/2021: IMPRESSIONS    1. Left ventricular ejection fraction, by estimation, is 65 to 70%. The  left ventricle has normal function. The left ventricle has no regional  wall motion abnormalities. Left ventricular diastolic parameters are  consistent with Grade I diastolic  dysfunction (impaired relaxation).   2. Right ventricular systolic function is normal. The right ventricular  size is normal. There is normal pulmonary artery systolic pressure.   3. The mitral valve is normal in structure. No evidence of mitral valve  regurgitation. No evidence of mitral stenosis.   4. The aortic valve is tricuspid. There is mild calcification of the  aortic valve. There is mild thickening of the aortic valve. Aortic valve  regurgitation is not visualized. Aortic valve sclerosis is present, with  no evidence of aortic valve stenosis.  Aortic valve mean gradient measures 5.0 mmHg. Aortic valve Vmax measures  1.38 m/s.   5. The inferior vena cava is dilated in size with >50% respiratory  variability, suggesting right  atrial pressure of 8 mmHg.     Neuro/Psych neg Seizures PSYCHIATRIC DISORDERS  Depression     Neuromuscular disease (neuropathy, neurogenic bladder)    GI/Hepatic negative GI ROS, Neg liver ROS,,,  Endo/Other  diabetes (Hgb A1c 8.9), Poorly Controlled, Type 2, Oral Hypoglycemic Agents, Insulin  Dependent    Renal/GU Renal disease   BPH    Musculoskeletal   Abdominal   Peds  Hematology  (+) Blood dyscrasia, anemia Lab Results      Component                Value               Date                      WBC                      14.4 (H)            10/19/2023                HGB                      12.1 (L)            10/19/2023                HCT                      39.4                10/19/2023  MCV                      80.4                10/19/2023                PLT                      515 (H)             10/19/2023              Anesthesia Other Findings Legally blind   Reproductive/Obstetrics                              Anesthesia Physical Anesthesia Plan  ASA: 3  Anesthesia Plan: General   Post-op Pain Management: Tylenol  PO (pre-op)*   Induction: Intravenous  PONV Risk Score and Plan: 2 and Ondansetron , Dexamethasone  and Treatment may vary due to age or medical condition  Airway Management Planned: LMA  Additional Equipment:   Intra-op Plan:   Post-operative Plan: Extubation in OR  Informed Consent: I have reviewed the patients History and Physical, chart, labs and discussed the procedure including the risks, benefits and alternatives for the proposed anesthesia with the patient or authorized representative who has indicated his/her understanding and acceptance.     Dental advisory given  Plan Discussed with: CRNA and Anesthesiologist  Anesthesia Plan Comments: (Risks of general anesthesia discussed including, but not limited to, sore throat, hoarse voice, chipped/damaged teeth, injury to vocal cords, nausea  and vomiting, allergic reactions, lung infection, heart attack, stroke, and death. All questions answered. )         Anesthesia Quick Evaluation

## 2023-10-23 NOTE — Transfer of Care (Signed)
 Immediate Anesthesia Transfer of Care Note  Patient: Dustin Duffy  Procedure(s) Performed: CYSTOSCOPY (Pelvis) INSTILLATION, BLADDER (Bladder)  Patient Location: PACU  Anesthesia Type:General  Level of Consciousness: oriented and drowsy  Airway & Oxygen  Therapy: Patient Spontanous Breathing and Patient connected to face mask oxygen   Post-op Assessment: Report given to RN and Post -op Vital signs reviewed and stable  Post vital signs: Reviewed and stable  Last Vitals:  Vitals Value Taken Time  BP 125/88 10/23/23 17:02  Temp    Pulse 80 10/23/23 17:05  Resp 17 10/23/23 17:05  SpO2 97 % 10/23/23 17:05  Vitals shown include unfiled device data.  Last Pain:  Vitals:   10/23/23 1342  TempSrc:   PainSc: 0-No pain      Patients Stated Pain Goal: 4 (10/23/23 1334)  Complications: No notable events documented.

## 2023-10-23 NOTE — Anesthesia Procedure Notes (Signed)
 Procedure Name: LMA Insertion Date/Time: 10/23/2023 4:36 PM  Performed by: Gladis Honey, CRNAPre-anesthesia Checklist: Patient identified, Emergency Drugs available, Suction available and Patient being monitored Patient Re-evaluated:Patient Re-evaluated prior to induction Oxygen  Delivery Method: Circle System Utilized Preoxygenation: Pre-oxygenation with 100% oxygen  Induction Type: IV induction Ventilation: Mask ventilation without difficulty LMA: LMA inserted LMA Size: 4.0 Number of attempts: 1 Airway Equipment and Method: Bite block Placement Confirmation: positive ETCO2 Tube secured with: Tape Dental Injury: Teeth and Oropharynx as per pre-operative assessment

## 2023-10-23 NOTE — Discharge Instructions (Addendum)
 Unplug your catheter and put it to drainage at 6:45 PM

## 2023-10-23 NOTE — H&P (Signed)
 CC/HPI: 04/28/2022: Patient with history of chronic urinary retention in the setting of areflexic/neuropathic bladder managed with chronic Foley catheter exchanged monthly here presents today as an add-on to my schedule for Foley catheter not draining. He states awakening yesterday with severe bladder and penile pain. Also complaining of leaking around catheter tubing. He has not noted any urine output since late yesterday evening or last night, he is unsure. He is not having gross hematuria, he denies fevers or chills. Patient no longer in rehab and is now back living at home.   His catheter was last exchanged on 1/4.   09/01/2022: Mr. Laduca is a 68 year old man who presents today with concerns of bladder pain. Currently, he is catheter dependent. He has a home health nurse who is also his neighbor who exchanges his catheters monthly. His last catheter exchange was 1 week ago. He endorses painful bladder spasms throughout the course of the day but these improved at night. He is not on any medication for bladder spasms. He is not having any fevers or chills. No blood in his catheter bag.   11/22/2022: 68 year old man who presents today with concerns of leakage around his catheter and painful bladder spasms throughout the course of the day. He is currently on Myrbetriq  but reports it has not been helping. He denies fevers, chills. He has not had any blood in his catheter bag.   12/09/2022  Patient elected against TURP in the past and instead wanted to continue Foley catheter. He however has been continuing to have what sounds like painful bladder spasms and leakage around the catheter despite 50 mg of Myrbetriq .   02/15/2023: Pt is s/p botox with Dr Carolee on 10/31. Continues to have painful bladder spasms, leaking around catheter tubing and intermittent periods of obstruction. He or his caregiver irrigate as needed and that usually is helpful. He tells me he has not seen any appreciable improvement since Botox.  His Myrbetriq  prescription has lapsed. He is struggling with hydration and constipation as well.   08/09/2023  Patient continues to have significant pain and discomfort from what he thinks is bladder spasms. Continues with a penile Foley catheter. Failed 200 units of Botox. This provided no relief.   09/26/2023: Patient presents today after having suprapubic catheter placed with IR on 6/18. It drained well for the first few days until 6/21. He then went to the ED and they placed a Foley catheter, this was also draining well until yesterday. He is uncomfortable and is leaking a large amount. He also has rash to his abdomen and thighs.   10/04/2023: Patient returns today for follow-up. He was seen last week for catheter occlusion and was found to have severe yeast infection. He is awaiting scheduling for cystoscopy and bladder washout with amphotericin B under anesthesia. In the meantime, his catheter has been draining well and he continues using miconazole cream with signficant improvement in his groin rash.     ALLERGIES: No Allergies    MEDICATIONS: Myrbetriq  25 MG Tablet Extended Release 24 Hour 1 tablet PO Daily  Simvastatin   Amitriptyline  HCl  Aspir 81  Atrovent  Hfa  Fluticasone Propionate  Gabapentin  300 MG Capsule capsule  Miconazole Nitrate 2 % Cream Apply a thin layer twice a day to affected area  Midodrine  Hcl  Nystatin 100000 UNIT/GM Powder Apply thin layer to inguinal regions 2-3 times daily  Vitamin D  Vitamin D3  Xigduo XR 10-500 MG Tablet Extended Release 24 Hour  Zinc Oxide 40 % Ointment apply  a thin layer three times per day to affected area     Notes: Needs new Rx for Mybetriq   GU PSH: Complex cystometrogram, w/ void pressure and urethral pressure profile studies, any technique - 2022 Complex Uroflow - 2022 Cystoscopy - 12/09/2022, 2023 Cystourethroscopy, W/Injection For Chemodenervation Of Bladder - 01/26/2023 Emg surf Electrd - 2022 Inject For cystogram -  2022 Intrabd voidng Press - 2022 Simple Change SP Tube - 09/26/2023     NON-GU PSH: Visit Complexity (formerly GPC1X) - 09/26/2023, 08/09/2023, 02/15/2023     GU PMH: Hydronephrosis - 09/26/2023 Other mechanical complication of indwelling urethral catheter, initial encounter - 09/26/2023, - 04/28/2022 Urinary Retention - 09/26/2023, - 08/09/2023, - 02/15/2023, - 12/09/2022, - 11/21/2022, - 09/01/2022, - 04/28/2022, - 2024, - 02/10/2022, - 12/20/2021, - 12/13/2021, - 10/22/2021, - 2023 (Stable), - 2023, - 2022, - 2022 Incontinence w/o Sensation - 08/09/2023, - 01/26/2023, - 12/09/2022 Pelvic/perineal pain - 08/09/2023 Abdominal Pain Unspec - 02/15/2023, - 11/21/2022 BPH w/LUTS - 12/09/2022, (Stable), - 2023, - 2019 Acute Cystitis/UTI - 04/28/2022 BPH w/o LUTS - 2023, Benign prostatic hypertrophy without lower urinary tract symptoms, - 2014 Elevated PSA - 2023, Elevated prostate specific antigen (PSA), - 2014 Encounter for Prostate Cancer screening - 2023 Incomplete bladder emptying - 2019 Urinary Frequency (Stable) - 2019, Increased urinary frequency, - 2014 Urinary Urgency - 2019 Weak Urinary Stream - 2019 Nocturia, Nocturia - 2014      PMH Notes:  1898-03-28 00:00:00 - Note: Normal Routine History And Physical Adult   NON-GU PMH: Personal history of other diseases of the circulatory system, History of hypertension - 2014 Personal history of other endocrine, nutritional and metabolic disease, History of diabetes mellitus - 2014 Diabetes Type 2 Inflammatory liver disease, unspecified    FAMILY HISTORY: Death In The Family Father - Father Diabetes - Father, Mother, Sister Prostate Cancer - Father   SOCIAL HISTORY: Marital Status: Divorced Preferred Language: English; Ethnicity: Not Hispanic Or Latino; Race: White Current Smoking Status: Patient does not smoke anymore.   Tobacco Use Assessment Completed: Used Tobacco in last 30 days? Has never drank.  Drinks 1 caffeinated drink per day. Patient's  occupation is/was retired.     Notes: Marital History - Single, Occupation:, Alcohol Use, Caffeine Use, Tobacco Use   REVIEW OF SYSTEMS:    GU Review Male:   Patient denies frequent urination, hard to postpone urination, burning/ pain with urination, get up at night to urinate, leakage of urine, stream starts and stops, trouble starting your stream, have to strain to urinate , erection problems, and penile pain.  Gastrointestinal (Upper):   Patient denies nausea, vomiting, and indigestion/ heartburn.  Gastrointestinal (Lower):   Patient denies diarrhea and constipation.  Constitutional:   Patient denies fever, night sweats, weight loss, and fatigue.  Skin:   Patient denies skin rash/ lesion and itching.  Eyes:   Patient denies blurred vision and double vision.  Ears/ Nose/ Throat:   Patient denies sore throat and sinus problems.  Hematologic/Lymphatic:   Patient denies swollen glands and easy bruising.  Cardiovascular:   Patient denies leg swelling and chest pains.  Respiratory:   Patient denies cough and shortness of breath.  Endocrine:   Patient denies excessive thirst.  Musculoskeletal:   Patient denies back pain and joint pain.  Neurological:   Patient denies dizziness and headaches.  Psychologic:   Patient denies depression and anxiety.   VITAL SIGNS:      10/04/2023 11:40 AM  BP 136/80 mmHg  Pulse 88 /  min  Temperature 98.0 F / 36.6 C   MULTI-SYSTEM PHYSICAL EXAMINATION:    Constitutional: Well-nourished. No physical deformities. Normally developed. Good grooming.  Neck: Neck symmetrical, not swollen. Normal tracheal position.  Respiratory: No labored breathing, no use of accessory muscles.   Cardiovascular: Normal temperature, normal extremity pulses, no swelling, no varicosities.  Skin: Groin erythema significantly improved from previous exam, some mild areas remain.   Neurologic / Psychiatric: Oriented to time, oriented to place, oriented to person. No depression, no anxiety,  no agitation.  Gastrointestinal: No mass, no tenderness, no rigidity, non obese abdomen. Sp tube in place draining clear yellow urine.   Musculoskeletal: Normal gait and station of head and neck.     Complexity of Data:  Source Of History:  Patient, Family/Caregiver, Medical Record Summary  Records Review:   Previous Doctor Records, Previous Patient Records  Urine Test Review:   Urine Culture  X-Ray Review: C.T. Abdomen/Pelvis: Reviewed Films. Reviewed Report. Discussed With Patient.     05/07/21 12/08/08 09/30/08 09/01/08  PSA  Total PSA 3.02 ng/mL 5.01  3.74  3.99   Free PSA  0.65     % Free PSA  13.0       PROCEDURES:          Visit Complexity - G2211    ASSESSMENT:      ICD-10 Details  1 GU:   Acute Cystitis/UTI - N30.00 Acute, Complicated Injury  2   Urinary Retention - R33.8 Chronic, Stable   PLAN:           Schedule Return Visit/Planned Activity: Next Available Appointment - Schedule Surgery          Document Letter(s):  Created for Patient: Clinical Summary         Notes:   Sp tube has been draining well with clear yellow urine. Groin rash has significantly improved, he continues with miconazole cream. Return precautions discussed. He is awaiting OR scheduling for cysto and bladder wash with amphotericin B.

## 2023-10-23 NOTE — Op Note (Signed)
 Operative Note  Preoperative diagnosis:  1.  Urinary retention with recurrent fungal infections  Postoperative diagnosis: 1.  Urinary retention 2.  Fungus ball  Procedure(s): 1.  Transurethral resection and evacuation of fungus ball--complicated 2.  Bladder fulguration of bleeders 3.  Simple suprapubic tube exchange  Surgeon: Sherwood Edison, MD  Assistants: None  Anesthesia: General  Complications: None immediate  EBL: Minimal  Specimens: 1.  Fungus ball  Drains/Catheters: 1.  18 French suprapubic tube catheter  Intraoperative findings: 1.  Normal anterior urethra 2.  Obstructing prostate with bilobar hypertrophy 3.  Bladder mucosa with numerous large bladder diverticuli.  Very diseased bladder.  There was some edema on the bladder wall but no obvious tumors.  He had no stones but he had a large about 5 cm phlegmon that was likely a fungus ball that required resection in order to extract it.  Some of the bladder was fulgurated from mild bleeding after instrumentation  Indication: 68 year old male with history of urinary retention managed with suprapubic tube has been having issues with catheter occlusion and yeast infection.  He presents for the previously mentioned operation.  Description of procedure:  The patient was identified and consent was obtained.  The patient was taken to the operating room and placed in the supine position.  The patient was placed under general anesthesia.  Perioperative antibiotics were administered.  The patient was placed in dorsal lithotomy.  Patient was prepped and draped in a standard sterile fashion and a timeout was performed.  I replaced the 18 French suprapubic tube and capped it with a catheter plug.  A 21 French rigid cystoscope was advanced into the urethra and into the bladder.  Complete cystoscopy was performed with findings noted above.  I attempted to evacuate the phlegmonous process in the bladder but the collection was too organized.   I exchanged for a resectoscope and advanced that with the visual obturator in place into the bladder.  I used a bipolar loop to resect the phlegmon/fungus ball to smaller pieces that was then able to be evacuated through the scope.  There were multiple other areas of debris within diverticuli that was evacuated.  I evacuated all debris and phlegmon.  There was a small amount of bleeding from the posterior bladder wall in a couple places that I fulgurated.  This was well away from the areas of expected ureteral orifice location which were not identified.  I drained the bladder and withdrew the scope.  I then instilled solution of normal saline and amphotericin B into the bladder through the suprapubic tube and capped the suprapubic tube.  This concluded the operation.  Patient tolerated the procedure well was stable postoperatively.  Plan: The amphotericin B solution will remain in his bladder for approximately 2 hours prior to disposal.  Patient will follow-up in about a month for reassessment.

## 2023-10-24 ENCOUNTER — Encounter (HOSPITAL_COMMUNITY): Payer: Self-pay | Admitting: Urology

## 2023-10-25 NOTE — Anesthesia Postprocedure Evaluation (Signed)
 Anesthesia Post Note  Patient: Dustin Duffy  Procedure(s) Performed: CYSTOSCOPY (Pelvis) INSTILLATION, BLADDER (Bladder)     Patient location during evaluation: PACU Anesthesia Type: General Level of consciousness: awake and alert Pain management: pain level controlled Vital Signs Assessment: post-procedure vital signs reviewed and stable Respiratory status: spontaneous breathing, nonlabored ventilation, respiratory function stable and patient connected to nasal cannula oxygen  Cardiovascular status: blood pressure returned to baseline and stable Postop Assessment: no apparent nausea or vomiting Anesthetic complications: no   No notable events documented.  Last Vitals:  Vitals:   10/23/23 1745 10/23/23 1748  BP: (!) 147/94 (!) 141/91  Pulse: 76 77  Resp: 16 (!) 22  Temp:  (!) 36.4 C  SpO2: 96% 96%    Last Pain:  Vitals:   10/23/23 1748  TempSrc: Oral  PainSc: 0-No pain                 Lillias Difrancesco S

## 2023-10-26 DIAGNOSIS — S42202D Unspecified fracture of upper end of left humerus, subsequent encounter for fracture with routine healing: Secondary | ICD-10-CM | POA: Diagnosis not present

## 2023-10-26 DIAGNOSIS — Z794 Long term (current) use of insulin: Secondary | ICD-10-CM | POA: Diagnosis not present

## 2023-10-26 DIAGNOSIS — I129 Hypertensive chronic kidney disease with stage 1 through stage 4 chronic kidney disease, or unspecified chronic kidney disease: Secondary | ICD-10-CM | POA: Diagnosis not present

## 2023-10-26 DIAGNOSIS — E1122 Type 2 diabetes mellitus with diabetic chronic kidney disease: Secondary | ICD-10-CM | POA: Diagnosis not present

## 2023-10-26 DIAGNOSIS — I700 Atherosclerosis of aorta: Secondary | ICD-10-CM | POA: Diagnosis not present

## 2023-10-26 DIAGNOSIS — N182 Chronic kidney disease, stage 2 (mild): Secondary | ICD-10-CM | POA: Diagnosis not present

## 2023-10-26 LAB — SURGICAL PATHOLOGY

## 2023-10-27 DIAGNOSIS — S42202D Unspecified fracture of upper end of left humerus, subsequent encounter for fracture with routine healing: Secondary | ICD-10-CM | POA: Diagnosis not present

## 2023-10-27 DIAGNOSIS — E1122 Type 2 diabetes mellitus with diabetic chronic kidney disease: Secondary | ICD-10-CM | POA: Diagnosis not present

## 2023-10-27 DIAGNOSIS — I129 Hypertensive chronic kidney disease with stage 1 through stage 4 chronic kidney disease, or unspecified chronic kidney disease: Secondary | ICD-10-CM | POA: Diagnosis not present

## 2023-10-27 DIAGNOSIS — I700 Atherosclerosis of aorta: Secondary | ICD-10-CM | POA: Diagnosis not present

## 2023-10-27 DIAGNOSIS — N182 Chronic kidney disease, stage 2 (mild): Secondary | ICD-10-CM | POA: Diagnosis not present

## 2023-10-27 DIAGNOSIS — Z794 Long term (current) use of insulin: Secondary | ICD-10-CM | POA: Diagnosis not present

## 2023-10-30 DIAGNOSIS — I700 Atherosclerosis of aorta: Secondary | ICD-10-CM | POA: Diagnosis not present

## 2023-10-30 DIAGNOSIS — S42202D Unspecified fracture of upper end of left humerus, subsequent encounter for fracture with routine healing: Secondary | ICD-10-CM | POA: Diagnosis not present

## 2023-10-30 DIAGNOSIS — E1122 Type 2 diabetes mellitus with diabetic chronic kidney disease: Secondary | ICD-10-CM | POA: Diagnosis not present

## 2023-10-30 DIAGNOSIS — N182 Chronic kidney disease, stage 2 (mild): Secondary | ICD-10-CM | POA: Diagnosis not present

## 2023-10-30 DIAGNOSIS — Z794 Long term (current) use of insulin: Secondary | ICD-10-CM | POA: Diagnosis not present

## 2023-10-30 DIAGNOSIS — I129 Hypertensive chronic kidney disease with stage 1 through stage 4 chronic kidney disease, or unspecified chronic kidney disease: Secondary | ICD-10-CM | POA: Diagnosis not present

## 2023-11-01 ENCOUNTER — Other Ambulatory Visit: Payer: Self-pay

## 2023-11-01 DIAGNOSIS — I700 Atherosclerosis of aorta: Secondary | ICD-10-CM | POA: Diagnosis not present

## 2023-11-01 DIAGNOSIS — E1122 Type 2 diabetes mellitus with diabetic chronic kidney disease: Secondary | ICD-10-CM | POA: Diagnosis not present

## 2023-11-01 DIAGNOSIS — N182 Chronic kidney disease, stage 2 (mild): Secondary | ICD-10-CM | POA: Diagnosis not present

## 2023-11-01 DIAGNOSIS — I129 Hypertensive chronic kidney disease with stage 1 through stage 4 chronic kidney disease, or unspecified chronic kidney disease: Secondary | ICD-10-CM | POA: Diagnosis not present

## 2023-11-01 DIAGNOSIS — S42202D Unspecified fracture of upper end of left humerus, subsequent encounter for fracture with routine healing: Secondary | ICD-10-CM | POA: Diagnosis not present

## 2023-11-01 DIAGNOSIS — Z794 Long term (current) use of insulin: Secondary | ICD-10-CM | POA: Diagnosis not present

## 2023-11-01 DIAGNOSIS — R338 Other retention of urine: Secondary | ICD-10-CM | POA: Diagnosis not present

## 2023-11-01 DIAGNOSIS — B3749 Other urogenital candidiasis: Secondary | ICD-10-CM | POA: Diagnosis not present

## 2023-11-01 NOTE — Patient Instructions (Signed)
 Visit Information  Thank you for taking time to visit with me today. Please don't hesitate to contact me if I can be of assistance to you before our next scheduled appointment.  Your next care management appointment is by telephone on Wednesday, September 9th at 11:30am.  Please call the care guide team at [phone] if you need to cancel, schedule, or reschedule an appointment.   A reminder to ALL patients/family/friends, please call the [place]  National Suicide Prevention Lifeline: 909 442 8884 or TTY: (260)273-9134 TTY (807)882-6390) to talk to a trained counselor if you are experiencing a Mental Health or Behavioral Health Crisis or need someone to talk to.  Santana Stamp BSN, CCM   VBC[employer] Population Health RN Care Manager Direct Dial: [phone]  Fax: [phone]

## 2023-11-01 NOTE — Patient Outreach (Addendum)
 Complex Care Management   Visit Note  11/01/2023  Name:  Dustin Duffy MRN: [id number] DOB: 1955-12-21  Situation: Referral received for Complex Care Management related to Falls and suprapubic catheter.  I obtained verbal consent from Caregiver, then with patient on separate call.  Visit completed with sister, Dustin Duffy and then with patient  on the phone.  Patient and sister deny urgent concerns. He is being seen by Urology for suprapubic catheter care, saw Urology today for follow up from 10/23/23 procedure for Transurethral resection and evacuation of fungus ball, suprapubic tube exchange.  Sister states she will be meeting with staff from [facility] for monthly in-home RN & SW support to provide continued care.   Background:   Past Medical History:  Diagnosis Date   BPH without urinary obstruction    Depression    Diabetes mellitus type 2 in nonobese (HCC)    Dyslipidemia    Essential hypertension    Neurogenic bladder    Neuropathy    Pneumonia    Renal impairment    Visual impairment     Assessment: Patient Reported Symptoms:  Cognitive Cognitive Status: No symptoms reported (Spoke to sister, Dustin Duffy (DPR, manages all of patient's care).)      Neurological Neurological Review of Symptoms: No symptoms reported    HEENT HEENT Symptoms Reported: Not assessed      Cardiovascular Cardiovascular Symptoms Reported: No symptoms reported    Respiratory Respiratory Symptoms Reported: No symptoms reported    Endocrine Endocrine Symptoms Reported: Not assessed    Gastrointestinal Gastrointestinal Symptoms Reported: No symptoms reported      Genitourinary Genitourinary Symptoms Reported: No symptoms reported Additional Genitourinary Details: Suprapubic catheter in place, had outpatient procedure 10/23/23 for Transurethral Resection and Evacuation of Fungus Ball, Suprapubic Tube Exchange, had f/u with Urology today, denies symptoms, states urologist was  pleased. Genitourinary Management Strategies:  (Suprapubic catheter, followed by Urology)  Integumentary Integumentary Symptoms Reported: No symptoms reported    Musculoskeletal Additional Musculoskeletal Details: Continues to use wheelchair, pivot and stand to transfer.        Psychosocial       Quality of Family Relationships: helpful, involved, supportive (Sister, Dustin Duffy, states PCP has placed an order for [facility] program - patient can receive services from SW, RN to do home visit monthly - she will be meeting for an evaluation soon.)      08/01/2023    3:19 PM  Depression screen PHQ 2/9  Decreased Interest 0  Down, Depressed, Hopeless 1  PHQ - 2 Score 1    There were no vitals filed for this visit.  Medications Reviewed Today   Medications were not reviewed in this encounter     Recommendation:   -Report falls, any new wounds to PCP.   -Report pain at bladder site, decreased urine from suprapubic catheter to Urologist.   Follow Up Plan:   Telephone follow-up in 1 month  Santana Stamp BSN, CCM [place]  VBC[facility] Population Health RN Care Manager Direct Dial: [phone]  Fax: [phone]

## 2023-11-04 DIAGNOSIS — S42202D Unspecified fracture of upper end of left humerus, subsequent encounter for fracture with routine healing: Secondary | ICD-10-CM | POA: Diagnosis not present

## 2023-11-04 DIAGNOSIS — I700 Atherosclerosis of aorta: Secondary | ICD-10-CM | POA: Diagnosis not present

## 2023-11-04 DIAGNOSIS — F32A Depression, unspecified: Secondary | ICD-10-CM | POA: Diagnosis not present

## 2023-11-04 DIAGNOSIS — N138 Other obstructive and reflux uropathy: Secondary | ICD-10-CM | POA: Diagnosis not present

## 2023-11-04 DIAGNOSIS — Z7982 Long term (current) use of aspirin: Secondary | ICD-10-CM | POA: Diagnosis not present

## 2023-11-04 DIAGNOSIS — Z87440 Personal history of urinary (tract) infections: Secondary | ICD-10-CM | POA: Diagnosis not present

## 2023-11-04 DIAGNOSIS — Z7952 Long term (current) use of systemic steroids: Secondary | ICD-10-CM | POA: Diagnosis not present

## 2023-11-04 DIAGNOSIS — L89153 Pressure ulcer of sacral region, stage 3: Secondary | ICD-10-CM | POA: Diagnosis not present

## 2023-11-04 DIAGNOSIS — L89890 Pressure ulcer of other site, unstageable: Secondary | ICD-10-CM | POA: Diagnosis not present

## 2023-11-04 DIAGNOSIS — J9811 Atelectasis: Secondary | ICD-10-CM | POA: Diagnosis not present

## 2023-11-04 DIAGNOSIS — I2510 Atherosclerotic heart disease of native coronary artery without angina pectoris: Secondary | ICD-10-CM | POA: Diagnosis not present

## 2023-11-04 DIAGNOSIS — N401 Enlarged prostate with lower urinary tract symptoms: Secondary | ICD-10-CM | POA: Diagnosis not present

## 2023-11-04 DIAGNOSIS — E1122 Type 2 diabetes mellitus with diabetic chronic kidney disease: Secondary | ICD-10-CM | POA: Diagnosis not present

## 2023-11-04 DIAGNOSIS — Z9181 History of falling: Secondary | ICD-10-CM | POA: Diagnosis not present

## 2023-11-04 DIAGNOSIS — H548 Legal blindness, as defined in USA: Secondary | ICD-10-CM | POA: Diagnosis not present

## 2023-11-04 DIAGNOSIS — Z604 Social exclusion and rejection: Secondary | ICD-10-CM | POA: Diagnosis not present

## 2023-11-04 DIAGNOSIS — N182 Chronic kidney disease, stage 2 (mild): Secondary | ICD-10-CM | POA: Diagnosis not present

## 2023-11-04 DIAGNOSIS — I129 Hypertensive chronic kidney disease with stage 1 through stage 4 chronic kidney disease, or unspecified chronic kidney disease: Secondary | ICD-10-CM | POA: Diagnosis not present

## 2023-11-04 DIAGNOSIS — R296 Repeated falls: Secondary | ICD-10-CM | POA: Diagnosis not present

## 2023-11-04 DIAGNOSIS — Z993 Dependence on wheelchair: Secondary | ICD-10-CM | POA: Diagnosis not present

## 2023-11-04 DIAGNOSIS — Z794 Long term (current) use of insulin: Secondary | ICD-10-CM | POA: Diagnosis not present

## 2023-11-04 DIAGNOSIS — D631 Anemia in chronic kidney disease: Secondary | ICD-10-CM | POA: Diagnosis not present

## 2023-11-04 DIAGNOSIS — Z7984 Long term (current) use of oral hypoglycemic drugs: Secondary | ICD-10-CM | POA: Diagnosis not present

## 2023-11-06 DIAGNOSIS — Z794 Long term (current) use of insulin: Secondary | ICD-10-CM | POA: Diagnosis not present

## 2023-11-06 DIAGNOSIS — E1122 Type 2 diabetes mellitus with diabetic chronic kidney disease: Secondary | ICD-10-CM | POA: Diagnosis not present

## 2023-11-06 DIAGNOSIS — I700 Atherosclerosis of aorta: Secondary | ICD-10-CM | POA: Diagnosis not present

## 2023-11-06 DIAGNOSIS — I129 Hypertensive chronic kidney disease with stage 1 through stage 4 chronic kidney disease, or unspecified chronic kidney disease: Secondary | ICD-10-CM | POA: Diagnosis not present

## 2023-11-06 DIAGNOSIS — N182 Chronic kidney disease, stage 2 (mild): Secondary | ICD-10-CM | POA: Diagnosis not present

## 2023-11-06 DIAGNOSIS — S42202D Unspecified fracture of upper end of left humerus, subsequent encounter for fracture with routine healing: Secondary | ICD-10-CM | POA: Diagnosis not present

## 2023-11-08 DIAGNOSIS — J189 Pneumonia, unspecified organism: Secondary | ICD-10-CM | POA: Diagnosis not present

## 2023-11-08 DIAGNOSIS — F02A18 Dementia in other diseases classified elsewhere, mild, with other behavioral disturbance: Secondary | ICD-10-CM | POA: Diagnosis not present

## 2023-11-08 DIAGNOSIS — R509 Fever, unspecified: Secondary | ICD-10-CM | POA: Diagnosis not present

## 2023-11-13 DIAGNOSIS — E1122 Type 2 diabetes mellitus with diabetic chronic kidney disease: Secondary | ICD-10-CM | POA: Diagnosis not present

## 2023-11-13 DIAGNOSIS — S42202D Unspecified fracture of upper end of left humerus, subsequent encounter for fracture with routine healing: Secondary | ICD-10-CM | POA: Diagnosis not present

## 2023-11-13 DIAGNOSIS — Z794 Long term (current) use of insulin: Secondary | ICD-10-CM | POA: Diagnosis not present

## 2023-11-13 DIAGNOSIS — I129 Hypertensive chronic kidney disease with stage 1 through stage 4 chronic kidney disease, or unspecified chronic kidney disease: Secondary | ICD-10-CM | POA: Diagnosis not present

## 2023-11-13 DIAGNOSIS — I700 Atherosclerosis of aorta: Secondary | ICD-10-CM | POA: Diagnosis not present

## 2023-11-13 DIAGNOSIS — N182 Chronic kidney disease, stage 2 (mild): Secondary | ICD-10-CM | POA: Diagnosis not present

## 2023-11-21 DIAGNOSIS — Z794 Long term (current) use of insulin: Secondary | ICD-10-CM | POA: Diagnosis not present

## 2023-11-21 DIAGNOSIS — E1122 Type 2 diabetes mellitus with diabetic chronic kidney disease: Secondary | ICD-10-CM | POA: Diagnosis not present

## 2023-11-21 DIAGNOSIS — I700 Atherosclerosis of aorta: Secondary | ICD-10-CM | POA: Diagnosis not present

## 2023-11-21 DIAGNOSIS — S42202D Unspecified fracture of upper end of left humerus, subsequent encounter for fracture with routine healing: Secondary | ICD-10-CM | POA: Diagnosis not present

## 2023-11-21 DIAGNOSIS — I129 Hypertensive chronic kidney disease with stage 1 through stage 4 chronic kidney disease, or unspecified chronic kidney disease: Secondary | ICD-10-CM | POA: Diagnosis not present

## 2023-11-21 DIAGNOSIS — N182 Chronic kidney disease, stage 2 (mild): Secondary | ICD-10-CM | POA: Diagnosis not present

## 2023-11-22 DIAGNOSIS — F02A18 Dementia in other diseases classified elsewhere, mild, with other behavioral disturbance: Secondary | ICD-10-CM | POA: Diagnosis not present

## 2023-11-24 DIAGNOSIS — E1122 Type 2 diabetes mellitus with diabetic chronic kidney disease: Secondary | ICD-10-CM | POA: Diagnosis not present

## 2023-11-24 DIAGNOSIS — Z794 Long term (current) use of insulin: Secondary | ICD-10-CM | POA: Diagnosis not present

## 2023-11-24 DIAGNOSIS — I700 Atherosclerosis of aorta: Secondary | ICD-10-CM | POA: Diagnosis not present

## 2023-11-24 DIAGNOSIS — S42202D Unspecified fracture of upper end of left humerus, subsequent encounter for fracture with routine healing: Secondary | ICD-10-CM | POA: Diagnosis not present

## 2023-11-24 DIAGNOSIS — N182 Chronic kidney disease, stage 2 (mild): Secondary | ICD-10-CM | POA: Diagnosis not present

## 2023-11-24 DIAGNOSIS — I129 Hypertensive chronic kidney disease with stage 1 through stage 4 chronic kidney disease, or unspecified chronic kidney disease: Secondary | ICD-10-CM | POA: Diagnosis not present

## 2023-11-27 DIAGNOSIS — N182 Chronic kidney disease, stage 2 (mild): Secondary | ICD-10-CM | POA: Diagnosis not present

## 2023-11-27 DIAGNOSIS — E1122 Type 2 diabetes mellitus with diabetic chronic kidney disease: Secondary | ICD-10-CM | POA: Diagnosis not present

## 2023-11-27 DIAGNOSIS — S42202D Unspecified fracture of upper end of left humerus, subsequent encounter for fracture with routine healing: Secondary | ICD-10-CM | POA: Diagnosis not present

## 2023-11-27 DIAGNOSIS — Z794 Long term (current) use of insulin: Secondary | ICD-10-CM | POA: Diagnosis not present

## 2023-11-27 DIAGNOSIS — I700 Atherosclerosis of aorta: Secondary | ICD-10-CM | POA: Diagnosis not present

## 2023-11-27 DIAGNOSIS — I129 Hypertensive chronic kidney disease with stage 1 through stage 4 chronic kidney disease, or unspecified chronic kidney disease: Secondary | ICD-10-CM | POA: Diagnosis not present

## 2023-11-29 ENCOUNTER — Other Ambulatory Visit: Payer: Self-pay

## 2023-11-29 DIAGNOSIS — I700 Atherosclerosis of aorta: Secondary | ICD-10-CM | POA: Diagnosis not present

## 2023-11-29 DIAGNOSIS — S42202D Unspecified fracture of upper end of left humerus, subsequent encounter for fracture with routine healing: Secondary | ICD-10-CM | POA: Diagnosis not present

## 2023-11-29 DIAGNOSIS — N182 Chronic kidney disease, stage 2 (mild): Secondary | ICD-10-CM | POA: Diagnosis not present

## 2023-11-29 DIAGNOSIS — Z794 Long term (current) use of insulin: Secondary | ICD-10-CM | POA: Diagnosis not present

## 2023-11-29 DIAGNOSIS — E1122 Type 2 diabetes mellitus with diabetic chronic kidney disease: Secondary | ICD-10-CM | POA: Diagnosis not present

## 2023-11-29 DIAGNOSIS — I129 Hypertensive chronic kidney disease with stage 1 through stage 4 chronic kidney disease, or unspecified chronic kidney disease: Secondary | ICD-10-CM | POA: Diagnosis not present

## 2023-11-29 NOTE — Patient Instructions (Signed)
 Visit Information  Thank you for taking time to visit with me today. Please don't hesitate to contact me if I can be of assistance to you before our next scheduled appointment.  Your next care management appointment is by telephone on Monday, September 22nd at 1:00pm with Olam Ku, RNCM.   Please call the care guide team at [phone] if you need to cancel, schedule, or reschedule an appointment.   A reminder to ALL patients/family/friends, please call the [place]  National Suicide Prevention Lifeline: 863-545-6411 or TTY: 660-319-6029 TTY (334)725-7938) to talk to a trained counselor if you are experiencing a Mental Health or Behavioral Health Crisis or need someone to talk to.  Santana Stamp BSN, CCM [place]  VBCI Population Health RN Care Manager Direct Dial: [phone]  Fax: [phone]

## 2023-11-29 NOTE — Patient Outreach (Signed)
 Complex Care Management   Visit Note  11/29/2023  Name:  CHANNIN AGUSTIN MRN: [id number] DOB: 04/14/1955  Situation: Referral received for Complex Care Management related to Frequent Falls. I obtained verbal consent from Patient.  Visit completed with Mr. Nyquist  on the phone.  Denies needs today.  States a new home agency will be sending aides in for personal care services.   Background:   Past Medical History:  Diagnosis Date   BPH without urinary obstruction    Depression    Diabetes mellitus type 2 in nonobese (HCC)    Dyslipidemia    Essential hypertension    Neurogenic bladder    Neuropathy    Pneumonia    Renal impairment    Visual impairment     Assessment: Patient Reported Symptoms:  Cognitive Cognitive Status: Normal speech and language skills, Struggling with memory recall, Requires Assistance Decision Making      Neurological Neurological Review of Symptoms: No symptoms reported    HEENT HEENT Symptoms Reported: No symptoms reported      Cardiovascular Cardiovascular Symptoms Reported: No symptoms reported    Respiratory Respiratory Symptoms Reported: No symptoms reported    Endocrine Endocrine Symptoms Reported: No symptoms reported Endocrine Self-Management Outcome: 3 (uncertain)  Gastrointestinal Gastrointestinal Symptoms Reported: No symptoms reported      Genitourinary Genitourinary Symptoms Reported: No symptoms reported    Integumentary Integumentary Symptoms Reported: No symptoms reported    Musculoskeletal Additional Musculoskeletal Details: Continues to use wheelchair, pivot and stand to transfer Musculoskeletal Self-Management Outcome: 4 (good)      Psychosocial Psychosocial Symptoms Reported: No symptoms reported Additional Psychological Details: Patient continues to receive strong support from family, has a new agency to provide aide service, he isn't sure on today's call how often aide will be coming in.          11/29/2023    PHQ2-9  Depression Screening   Little interest or pleasure in doing things    Feeling down, depressed, or hopeless    PHQ-2 - Total Score    Trouble falling or staying asleep, or sleeping too much    Feeling tired or having little energy    Poor appetite or overeating     Feeling bad about yourself - or that you are a failure or have let yourself or your family down    Trouble concentrating on things, such as reading the newspaper or watching television    Moving or speaking so slowly that other people could have noticed.  Or the opposite - being so fidgety or restless that you have been moving around a lot more than usual    Thoughts that you would be better off dead, or hurting yourself in some way    PHQ2-9 Total Score    If you checked off any problems, how difficult have these problems made it for you to do your work, take care of things at home, or get along with other people    Depression Interventions/Treatment      There were no vitals filed for this visit.  Medications Reviewed Today   Medications were not reviewed in this encounter     Recommendation:   Continue Current Plan of Care Patient will continue to work with new agency that is providing in-home aide services.   Follow Up Plan:   Telephone follow-up two months.  Santana Stamp BSN, CCM [place]  VBCI Population Health RN Care Manager Direct Dial: [phone]  Fax: [phone]

## 2023-11-30 DIAGNOSIS — Z794 Long term (current) use of insulin: Secondary | ICD-10-CM | POA: Diagnosis not present

## 2023-11-30 DIAGNOSIS — I700 Atherosclerosis of aorta: Secondary | ICD-10-CM | POA: Diagnosis not present

## 2023-11-30 DIAGNOSIS — N182 Chronic kidney disease, stage 2 (mild): Secondary | ICD-10-CM | POA: Diagnosis not present

## 2023-11-30 DIAGNOSIS — E1122 Type 2 diabetes mellitus with diabetic chronic kidney disease: Secondary | ICD-10-CM | POA: Diagnosis not present

## 2023-11-30 DIAGNOSIS — I129 Hypertensive chronic kidney disease with stage 1 through stage 4 chronic kidney disease, or unspecified chronic kidney disease: Secondary | ICD-10-CM | POA: Diagnosis not present

## 2023-11-30 DIAGNOSIS — S42202D Unspecified fracture of upper end of left humerus, subsequent encounter for fracture with routine healing: Secondary | ICD-10-CM | POA: Diagnosis not present

## 2023-12-04 DIAGNOSIS — Z993 Dependence on wheelchair: Secondary | ICD-10-CM | POA: Diagnosis not present

## 2023-12-04 DIAGNOSIS — S42202D Unspecified fracture of upper end of left humerus, subsequent encounter for fracture with routine healing: Secondary | ICD-10-CM | POA: Diagnosis not present

## 2023-12-04 DIAGNOSIS — Z794 Long term (current) use of insulin: Secondary | ICD-10-CM | POA: Diagnosis not present

## 2023-12-04 DIAGNOSIS — Z7984 Long term (current) use of oral hypoglycemic drugs: Secondary | ICD-10-CM | POA: Diagnosis not present

## 2023-12-04 DIAGNOSIS — Z7982 Long term (current) use of aspirin: Secondary | ICD-10-CM | POA: Diagnosis not present

## 2023-12-04 DIAGNOSIS — Z9181 History of falling: Secondary | ICD-10-CM | POA: Diagnosis not present

## 2023-12-04 DIAGNOSIS — I700 Atherosclerosis of aorta: Secondary | ICD-10-CM | POA: Diagnosis not present

## 2023-12-04 DIAGNOSIS — R296 Repeated falls: Secondary | ICD-10-CM | POA: Diagnosis not present

## 2023-12-04 DIAGNOSIS — N401 Enlarged prostate with lower urinary tract symptoms: Secondary | ICD-10-CM | POA: Diagnosis not present

## 2023-12-04 DIAGNOSIS — J9811 Atelectasis: Secondary | ICD-10-CM | POA: Diagnosis not present

## 2023-12-04 DIAGNOSIS — Z87440 Personal history of urinary (tract) infections: Secondary | ICD-10-CM | POA: Diagnosis not present

## 2023-12-04 DIAGNOSIS — L89153 Pressure ulcer of sacral region, stage 3: Secondary | ICD-10-CM | POA: Diagnosis not present

## 2023-12-04 DIAGNOSIS — H548 Legal blindness, as defined in USA: Secondary | ICD-10-CM | POA: Diagnosis not present

## 2023-12-04 DIAGNOSIS — N182 Chronic kidney disease, stage 2 (mild): Secondary | ICD-10-CM | POA: Diagnosis not present

## 2023-12-04 DIAGNOSIS — D631 Anemia in chronic kidney disease: Secondary | ICD-10-CM | POA: Diagnosis not present

## 2023-12-04 DIAGNOSIS — I2510 Atherosclerotic heart disease of native coronary artery without angina pectoris: Secondary | ICD-10-CM | POA: Diagnosis not present

## 2023-12-04 DIAGNOSIS — Z604 Social exclusion and rejection: Secondary | ICD-10-CM | POA: Diagnosis not present

## 2023-12-04 DIAGNOSIS — F32A Depression, unspecified: Secondary | ICD-10-CM | POA: Diagnosis not present

## 2023-12-04 DIAGNOSIS — Z7952 Long term (current) use of systemic steroids: Secondary | ICD-10-CM | POA: Diagnosis not present

## 2023-12-04 DIAGNOSIS — L89890 Pressure ulcer of other site, unstageable: Secondary | ICD-10-CM | POA: Diagnosis not present

## 2023-12-04 DIAGNOSIS — I129 Hypertensive chronic kidney disease with stage 1 through stage 4 chronic kidney disease, or unspecified chronic kidney disease: Secondary | ICD-10-CM | POA: Diagnosis not present

## 2023-12-04 DIAGNOSIS — E1122 Type 2 diabetes mellitus with diabetic chronic kidney disease: Secondary | ICD-10-CM | POA: Diagnosis not present

## 2023-12-04 DIAGNOSIS — N138 Other obstructive and reflux uropathy: Secondary | ICD-10-CM | POA: Diagnosis not present

## 2023-12-05 DIAGNOSIS — Z794 Long term (current) use of insulin: Secondary | ICD-10-CM | POA: Diagnosis not present

## 2023-12-05 DIAGNOSIS — N182 Chronic kidney disease, stage 2 (mild): Secondary | ICD-10-CM | POA: Diagnosis not present

## 2023-12-05 DIAGNOSIS — I129 Hypertensive chronic kidney disease with stage 1 through stage 4 chronic kidney disease, or unspecified chronic kidney disease: Secondary | ICD-10-CM | POA: Diagnosis not present

## 2023-12-05 DIAGNOSIS — I700 Atherosclerosis of aorta: Secondary | ICD-10-CM | POA: Diagnosis not present

## 2023-12-05 DIAGNOSIS — S42202D Unspecified fracture of upper end of left humerus, subsequent encounter for fracture with routine healing: Secondary | ICD-10-CM | POA: Diagnosis not present

## 2023-12-05 DIAGNOSIS — E1122 Type 2 diabetes mellitus with diabetic chronic kidney disease: Secondary | ICD-10-CM | POA: Diagnosis not present

## 2023-12-11 DIAGNOSIS — Z794 Long term (current) use of insulin: Secondary | ICD-10-CM | POA: Diagnosis not present

## 2023-12-11 DIAGNOSIS — I700 Atherosclerosis of aorta: Secondary | ICD-10-CM | POA: Diagnosis not present

## 2023-12-11 DIAGNOSIS — I129 Hypertensive chronic kidney disease with stage 1 through stage 4 chronic kidney disease, or unspecified chronic kidney disease: Secondary | ICD-10-CM | POA: Diagnosis not present

## 2023-12-11 DIAGNOSIS — N182 Chronic kidney disease, stage 2 (mild): Secondary | ICD-10-CM | POA: Diagnosis not present

## 2023-12-11 DIAGNOSIS — E1122 Type 2 diabetes mellitus with diabetic chronic kidney disease: Secondary | ICD-10-CM | POA: Diagnosis not present

## 2023-12-11 DIAGNOSIS — S42202D Unspecified fracture of upper end of left humerus, subsequent encounter for fracture with routine healing: Secondary | ICD-10-CM | POA: Diagnosis not present

## 2023-12-12 DIAGNOSIS — E1122 Type 2 diabetes mellitus with diabetic chronic kidney disease: Secondary | ICD-10-CM | POA: Diagnosis not present

## 2023-12-12 DIAGNOSIS — Z794 Long term (current) use of insulin: Secondary | ICD-10-CM | POA: Diagnosis not present

## 2023-12-12 DIAGNOSIS — N182 Chronic kidney disease, stage 2 (mild): Secondary | ICD-10-CM | POA: Diagnosis not present

## 2023-12-12 DIAGNOSIS — S42202D Unspecified fracture of upper end of left humerus, subsequent encounter for fracture with routine healing: Secondary | ICD-10-CM | POA: Diagnosis not present

## 2023-12-12 DIAGNOSIS — I700 Atherosclerosis of aorta: Secondary | ICD-10-CM | POA: Diagnosis not present

## 2023-12-12 DIAGNOSIS — I129 Hypertensive chronic kidney disease with stage 1 through stage 4 chronic kidney disease, or unspecified chronic kidney disease: Secondary | ICD-10-CM | POA: Diagnosis not present

## 2023-12-13 DIAGNOSIS — F02A18 Dementia in other diseases classified elsewhere, mild, with other behavioral disturbance: Secondary | ICD-10-CM | POA: Diagnosis not present

## 2023-12-18 ENCOUNTER — Other Ambulatory Visit: Payer: Self-pay | Admitting: *Deleted

## 2023-12-18 DIAGNOSIS — E1122 Type 2 diabetes mellitus with diabetic chronic kidney disease: Secondary | ICD-10-CM | POA: Diagnosis not present

## 2023-12-18 DIAGNOSIS — I129 Hypertensive chronic kidney disease with stage 1 through stage 4 chronic kidney disease, or unspecified chronic kidney disease: Secondary | ICD-10-CM | POA: Diagnosis not present

## 2023-12-18 DIAGNOSIS — I700 Atherosclerosis of aorta: Secondary | ICD-10-CM | POA: Diagnosis not present

## 2023-12-18 DIAGNOSIS — S42202D Unspecified fracture of upper end of left humerus, subsequent encounter for fracture with routine healing: Secondary | ICD-10-CM | POA: Diagnosis not present

## 2023-12-18 DIAGNOSIS — Z794 Long term (current) use of insulin: Secondary | ICD-10-CM | POA: Diagnosis not present

## 2023-12-18 DIAGNOSIS — N182 Chronic kidney disease, stage 2 (mild): Secondary | ICD-10-CM | POA: Diagnosis not present

## 2023-12-18 NOTE — Patient Outreach (Signed)
 Complex Care Management   Visit Note  12/18/2023  Name:  Dustin Duffy MRN: [id number] DOB: [birth year]  Situation: Referral received for Complex Care Management related to Falls I obtained verbal consent from POA sister Dustin Duffy.  Visit completed with POA sister Dustin Duffy on the phone  Background:   Past Medical History:  Diagnosis Date   BPH without urinary obstruction    Depression    Diabetes mellitus type 2 in nonobese [facility])    Dyslipidemia    Essential hypertension    Neurogenic bladder    Neuropathy    Pneumonia    Renal impairment    Visual impairment     Assessment: Sister Dustin Duffy [facility]) provided permission to call Dustin Duffy (close friend and caregiver) who fills patient's pillbox. RNCM call however only able to leave a message requesting a call back. Will further inquire and obtain an update on patient's medication list on the call back. No further needs presented at this time. Patient Reported Symptoms:  Cognitive Cognitive Status: Normal speech and language skills, Struggling with memory recall, Requires Assistance Decision Making Cognitive/Intellectual Conditions Management [RPT]: None reported or documented in medical history or problem list   Health Maintenance Behaviors: Annual physical exam Healing Pattern: Unsure Health Facilitated by: Healthy diet, Rest  Neurological Neurological Review of Symptoms: No symptoms reported (Legally blind) Neurological Management Strategies: Coping strategies, Routine screening  HEENT HEENT Symptoms Reported: No symptoms reported HEENT Management Strategies: Routine screening    Cardiovascular Cardiovascular Symptoms Reported: No symptoms reported Cardiovascular Management Strategies: Routine screening  Respiratory Respiratory Symptoms Reported: No symptoms reported Respiratory Management Strategies: Routine screening  Endocrine Endocrine Symptoms Reported: No symptoms reported Is patient diabetic?: Yes Is patient checking blood  sugars at home?: Yes List most recent blood sugar readings, include date and time of day: Libre system unable to provide immediate reads however checks regulaly  with parameters/alert with abnormal readings Endocrine Self-Management Outcome: 4 (good)  Gastrointestinal Gastrointestinal Symptoms Reported: No symptoms reported Gastrointestinal Management Strategies: Fluid modification    Genitourinary Genitourinary Symptoms Reported: No symptoms reported Other Genitourinary Symptoms: Dustin Duffy (sister) reports pt has a suprapubic catheter in place with regular visits to the urologist. Genitourinary Management Strategies: Catheter, indwelling Wyman (sister) reports pt has a suprapubic catheter in place with regular visits to the urologist.) Indwelling Catheter Inserted: 10/23/23 (Outpt procedure 10/23/2023 Transuethral Resection and Evacuation)  Integumentary Additional Integumentary Details: Ulcers on bilateral feet (healed) lower back with [facility] making home visits. Sister Dustin Duffy reports areas are healing. Skin Management Strategies: Dressing changes Skin Self-Management Outcome: 4 (good)  Musculoskeletal Musculoskelatal Symptoms Reviewed: Weakness Additional Musculoskeletal Details: Uses wheelchair and able to pivot with stands to transfer independently with supervision nearby. Musculoskeletal Management Strategies: Medical device, Routine screening, Coping strategies Musculoskeletal Self-Management Outcome: 4 (good) Falls in the past year?: Yes (Recent falls a few weeks ago with no injuries or ED visits) Number of falls in past year: 2 or more Was there an injury with Fall?: No Fall Risk Category Calculator: 2 Patient Fall Risk Level: Moderate Fall Risk Patient at Risk for Falls Due to: History of fall(s) Fall risk Follow up: Falls prevention discussed (Reiterated to his sister to educate pt on use of his DME. RNCM stress the importance of fall prevention.)  Psychosocial   Behavioral  Management Strategies: Coping strategies, Support system        There were no vitals filed for this visit.  Medications Reviewed Today   Medications were not reviewed in this encounter  Recommendation:   PCP Follow-up Continue Current Plan of Care  Follow Up Plan:   Telephone follow up appointment date/time:  01/15/2024 @ 1:30 pm   Olam Ku, RN, BSN [place]  [facility], [facility] Health RN Care Manager Direct Dial: [phone]  Fax: [phone]

## 2023-12-18 NOTE — Patient Instructions (Signed)
 Visit Information  Thank you for taking time to visit with me today. Please don't hesitate to contact me if I can be of assistance to you before our next scheduled appointment.  Your next care management appointment is by telephone on 01/15/2024 at 1:30 pm  Please call the care guide team at [phone] if you need to cancel, schedule, or reschedule an appointment.   Please call the Suicide and Crisis Lifeline: 988 call the [place]  National Suicide Prevention Lifeline: (321)813-0050 or TTY: 9383430166 TTY 639-123-7004) to talk to a trained counselor call 1-800-273-TALK (toll free, 24 hour hotline) if you are experiencing a Mental Health or Behavioral Health Crisis or need someone to talk to.   Olam Ku, RN, BSN [place]  [facility], [facility] Health RN Care Manager Direct Dial: [phone]  Fax: [phone]

## 2023-12-19 DIAGNOSIS — S42202D Unspecified fracture of upper end of left humerus, subsequent encounter for fracture with routine healing: Secondary | ICD-10-CM | POA: Diagnosis not present

## 2023-12-19 DIAGNOSIS — I700 Atherosclerosis of aorta: Secondary | ICD-10-CM | POA: Diagnosis not present

## 2023-12-19 DIAGNOSIS — Z794 Long term (current) use of insulin: Secondary | ICD-10-CM | POA: Diagnosis not present

## 2023-12-19 DIAGNOSIS — E1122 Type 2 diabetes mellitus with diabetic chronic kidney disease: Secondary | ICD-10-CM | POA: Diagnosis not present

## 2023-12-19 DIAGNOSIS — I129 Hypertensive chronic kidney disease with stage 1 through stage 4 chronic kidney disease, or unspecified chronic kidney disease: Secondary | ICD-10-CM | POA: Diagnosis not present

## 2023-12-19 DIAGNOSIS — N182 Chronic kidney disease, stage 2 (mild): Secondary | ICD-10-CM | POA: Diagnosis not present

## 2023-12-25 DIAGNOSIS — E1122 Type 2 diabetes mellitus with diabetic chronic kidney disease: Secondary | ICD-10-CM | POA: Diagnosis not present

## 2023-12-25 DIAGNOSIS — S42202D Unspecified fracture of upper end of left humerus, subsequent encounter for fracture with routine healing: Secondary | ICD-10-CM | POA: Diagnosis not present

## 2023-12-25 DIAGNOSIS — N182 Chronic kidney disease, stage 2 (mild): Secondary | ICD-10-CM | POA: Diagnosis not present

## 2023-12-25 DIAGNOSIS — I129 Hypertensive chronic kidney disease with stage 1 through stage 4 chronic kidney disease, or unspecified chronic kidney disease: Secondary | ICD-10-CM | POA: Diagnosis not present

## 2023-12-25 DIAGNOSIS — Z794 Long term (current) use of insulin: Secondary | ICD-10-CM | POA: Diagnosis not present

## 2023-12-25 DIAGNOSIS — I700 Atherosclerosis of aorta: Secondary | ICD-10-CM | POA: Diagnosis not present

## 2023-12-28 DIAGNOSIS — I129 Hypertensive chronic kidney disease with stage 1 through stage 4 chronic kidney disease, or unspecified chronic kidney disease: Secondary | ICD-10-CM | POA: Diagnosis not present

## 2023-12-28 DIAGNOSIS — E1122 Type 2 diabetes mellitus with diabetic chronic kidney disease: Secondary | ICD-10-CM | POA: Diagnosis not present

## 2023-12-28 DIAGNOSIS — Z794 Long term (current) use of insulin: Secondary | ICD-10-CM | POA: Diagnosis not present

## 2023-12-28 DIAGNOSIS — N182 Chronic kidney disease, stage 2 (mild): Secondary | ICD-10-CM | POA: Diagnosis not present

## 2023-12-28 DIAGNOSIS — S42202D Unspecified fracture of upper end of left humerus, subsequent encounter for fracture with routine healing: Secondary | ICD-10-CM | POA: Diagnosis not present

## 2023-12-28 DIAGNOSIS — I700 Atherosclerosis of aorta: Secondary | ICD-10-CM | POA: Diagnosis not present

## 2023-12-31 DIAGNOSIS — I129 Hypertensive chronic kidney disease with stage 1 through stage 4 chronic kidney disease, or unspecified chronic kidney disease: Secondary | ICD-10-CM | POA: Diagnosis not present

## 2023-12-31 DIAGNOSIS — E1122 Type 2 diabetes mellitus with diabetic chronic kidney disease: Secondary | ICD-10-CM | POA: Diagnosis not present

## 2023-12-31 DIAGNOSIS — S42202D Unspecified fracture of upper end of left humerus, subsequent encounter for fracture with routine healing: Secondary | ICD-10-CM | POA: Diagnosis not present

## 2023-12-31 DIAGNOSIS — I700 Atherosclerosis of aorta: Secondary | ICD-10-CM | POA: Diagnosis not present

## 2023-12-31 DIAGNOSIS — Z794 Long term (current) use of insulin: Secondary | ICD-10-CM | POA: Diagnosis not present

## 2023-12-31 DIAGNOSIS — N182 Chronic kidney disease, stage 2 (mild): Secondary | ICD-10-CM | POA: Diagnosis not present

## 2024-01-01 DIAGNOSIS — N182 Chronic kidney disease, stage 2 (mild): Secondary | ICD-10-CM | POA: Diagnosis not present

## 2024-01-01 DIAGNOSIS — I129 Hypertensive chronic kidney disease with stage 1 through stage 4 chronic kidney disease, or unspecified chronic kidney disease: Secondary | ICD-10-CM | POA: Diagnosis not present

## 2024-01-01 DIAGNOSIS — S42202D Unspecified fracture of upper end of left humerus, subsequent encounter for fracture with routine healing: Secondary | ICD-10-CM | POA: Diagnosis not present

## 2024-01-01 DIAGNOSIS — Z794 Long term (current) use of insulin: Secondary | ICD-10-CM | POA: Diagnosis not present

## 2024-01-01 DIAGNOSIS — E1122 Type 2 diabetes mellitus with diabetic chronic kidney disease: Secondary | ICD-10-CM | POA: Diagnosis not present

## 2024-01-01 DIAGNOSIS — I700 Atherosclerosis of aorta: Secondary | ICD-10-CM | POA: Diagnosis not present

## 2024-01-02 DIAGNOSIS — E1122 Type 2 diabetes mellitus with diabetic chronic kidney disease: Secondary | ICD-10-CM | POA: Diagnosis not present

## 2024-01-02 DIAGNOSIS — I129 Hypertensive chronic kidney disease with stage 1 through stage 4 chronic kidney disease, or unspecified chronic kidney disease: Secondary | ICD-10-CM | POA: Diagnosis not present

## 2024-01-02 DIAGNOSIS — S42202D Unspecified fracture of upper end of left humerus, subsequent encounter for fracture with routine healing: Secondary | ICD-10-CM | POA: Diagnosis not present

## 2024-01-02 DIAGNOSIS — Z794 Long term (current) use of insulin: Secondary | ICD-10-CM | POA: Diagnosis not present

## 2024-01-02 DIAGNOSIS — N182 Chronic kidney disease, stage 2 (mild): Secondary | ICD-10-CM | POA: Diagnosis not present

## 2024-01-02 DIAGNOSIS — I700 Atherosclerosis of aorta: Secondary | ICD-10-CM | POA: Diagnosis not present

## 2024-01-03 DIAGNOSIS — N401 Enlarged prostate with lower urinary tract symptoms: Secondary | ICD-10-CM | POA: Diagnosis not present

## 2024-01-03 DIAGNOSIS — F32A Depression, unspecified: Secondary | ICD-10-CM | POA: Diagnosis not present

## 2024-01-03 DIAGNOSIS — E1122 Type 2 diabetes mellitus with diabetic chronic kidney disease: Secondary | ICD-10-CM | POA: Diagnosis not present

## 2024-01-03 DIAGNOSIS — Z7985 Long-term (current) use of injectable non-insulin antidiabetic drugs: Secondary | ICD-10-CM | POA: Diagnosis not present

## 2024-01-03 DIAGNOSIS — H548 Legal blindness, as defined in USA: Secondary | ICD-10-CM | POA: Diagnosis not present

## 2024-01-03 DIAGNOSIS — N182 Chronic kidney disease, stage 2 (mild): Secondary | ICD-10-CM | POA: Diagnosis not present

## 2024-01-03 DIAGNOSIS — F0393 Unspecified dementia, unspecified severity, with mood disturbance: Secondary | ICD-10-CM | POA: Diagnosis not present

## 2024-01-03 DIAGNOSIS — R296 Repeated falls: Secondary | ICD-10-CM | POA: Diagnosis not present

## 2024-01-03 DIAGNOSIS — I129 Hypertensive chronic kidney disease with stage 1 through stage 4 chronic kidney disease, or unspecified chronic kidney disease: Secondary | ICD-10-CM | POA: Diagnosis not present

## 2024-01-03 DIAGNOSIS — Z87440 Personal history of urinary (tract) infections: Secondary | ICD-10-CM | POA: Diagnosis not present

## 2024-01-03 DIAGNOSIS — Z7984 Long term (current) use of oral hypoglycemic drugs: Secondary | ICD-10-CM | POA: Diagnosis not present

## 2024-01-03 DIAGNOSIS — S42202D Unspecified fracture of upper end of left humerus, subsequent encounter for fracture with routine healing: Secondary | ICD-10-CM | POA: Diagnosis not present

## 2024-01-03 DIAGNOSIS — Z604 Social exclusion and rejection: Secondary | ICD-10-CM | POA: Diagnosis not present

## 2024-01-03 DIAGNOSIS — I2510 Atherosclerotic heart disease of native coronary artery without angina pectoris: Secondary | ICD-10-CM | POA: Diagnosis not present

## 2024-01-03 DIAGNOSIS — E1142 Type 2 diabetes mellitus with diabetic polyneuropathy: Secondary | ICD-10-CM | POA: Diagnosis not present

## 2024-01-03 DIAGNOSIS — Z9181 History of falling: Secondary | ICD-10-CM | POA: Diagnosis not present

## 2024-01-03 DIAGNOSIS — L89153 Pressure ulcer of sacral region, stage 3: Secondary | ICD-10-CM | POA: Diagnosis not present

## 2024-01-03 DIAGNOSIS — I700 Atherosclerosis of aorta: Secondary | ICD-10-CM | POA: Diagnosis not present

## 2024-01-03 DIAGNOSIS — N138 Other obstructive and reflux uropathy: Secondary | ICD-10-CM | POA: Diagnosis not present

## 2024-01-03 DIAGNOSIS — Z466 Encounter for fitting and adjustment of urinary device: Secondary | ICD-10-CM | POA: Diagnosis not present

## 2024-01-03 DIAGNOSIS — Z7982 Long term (current) use of aspirin: Secondary | ICD-10-CM | POA: Diagnosis not present

## 2024-01-03 DIAGNOSIS — D631 Anemia in chronic kidney disease: Secondary | ICD-10-CM | POA: Diagnosis not present

## 2024-01-08 DIAGNOSIS — L89153 Pressure ulcer of sacral region, stage 3: Secondary | ICD-10-CM | POA: Diagnosis not present

## 2024-01-08 DIAGNOSIS — I700 Atherosclerosis of aorta: Secondary | ICD-10-CM | POA: Diagnosis not present

## 2024-01-08 DIAGNOSIS — E1142 Type 2 diabetes mellitus with diabetic polyneuropathy: Secondary | ICD-10-CM | POA: Diagnosis not present

## 2024-01-08 DIAGNOSIS — F0393 Unspecified dementia, unspecified severity, with mood disturbance: Secondary | ICD-10-CM | POA: Diagnosis not present

## 2024-01-08 DIAGNOSIS — I129 Hypertensive chronic kidney disease with stage 1 through stage 4 chronic kidney disease, or unspecified chronic kidney disease: Secondary | ICD-10-CM | POA: Diagnosis not present

## 2024-01-08 DIAGNOSIS — E1122 Type 2 diabetes mellitus with diabetic chronic kidney disease: Secondary | ICD-10-CM | POA: Diagnosis not present

## 2024-01-15 ENCOUNTER — Other Ambulatory Visit: Payer: Self-pay | Admitting: *Deleted

## 2024-01-15 DIAGNOSIS — E1142 Type 2 diabetes mellitus with diabetic polyneuropathy: Secondary | ICD-10-CM | POA: Diagnosis not present

## 2024-01-15 DIAGNOSIS — L89153 Pressure ulcer of sacral region, stage 3: Secondary | ICD-10-CM | POA: Diagnosis not present

## 2024-01-15 DIAGNOSIS — F0393 Unspecified dementia, unspecified severity, with mood disturbance: Secondary | ICD-10-CM | POA: Diagnosis not present

## 2024-01-15 DIAGNOSIS — E1122 Type 2 diabetes mellitus with diabetic chronic kidney disease: Secondary | ICD-10-CM | POA: Diagnosis not present

## 2024-01-15 DIAGNOSIS — I129 Hypertensive chronic kidney disease with stage 1 through stage 4 chronic kidney disease, or unspecified chronic kidney disease: Secondary | ICD-10-CM | POA: Diagnosis not present

## 2024-01-15 DIAGNOSIS — I700 Atherosclerosis of aorta: Secondary | ICD-10-CM | POA: Diagnosis not present

## 2024-01-15 NOTE — Patient Outreach (Signed)
 Complex Care Management   Visit Note  01/15/2024  Name:  Dustin Duffy MRN: [id number] DOB: [birth year]  Situation: Referral received for Complex Care Management related to Falls I obtained verbal consent from Patient.  Visit completed with Patient  on the phone  Background:   Past Medical History:  Diagnosis Date   BPH without urinary obstruction    Depression    Diabetes mellitus type 2 in nonobese (HCC)    Dyslipidemia    Essential hypertension    Neurogenic bladder    Neuropathy    Pneumonia    Renal impairment    Visual impairment     Assessment: RNCM discussed last A1C at 8.9 (10/19/2023)and current CBG at 118-148 with the fasting today at 138. Pt remains asymptomatic and opt to decline managing his diabetes at this time. RNCM able to intervene with possible risk related to not managing his diabetes and encouraged pt to continue monitoring and contact his provider with any worsening symptoms (receptive). Pt has verified he takes all his medications but requested RNCM contact [name] (caregiver) to verify all his medications as pt is legally blind and has a prefilled medication boxes. RNCM only able to leave a voice message to [name] as permitted requesting a call back.   Patient Reported Symptoms:  Cognitive Cognitive Status: Able to follow simple commands, Alert and oriented to person, place, and time, Normal speech and language skills Cognitive/Intellectual Conditions Management [RPT]: None reported or documented in medical history or problem list   Health Maintenance Behaviors: Annual physical exam Health Facilitated by: Healthy diet, Rest  Neurological Neurological Review of Symptoms: No symptoms reported (Legally blind)    HEENT HEENT Symptoms Reported: No symptoms reported (history legally blind-patient reports 2200/2700 on his readings) HEENT Management Strategies: Routine screening Vision problem(s)  Cardiovascular Cardiovascular Symptoms Reported: No symptoms  reported Does patient have uncontrolled Hypertension?: No Cardiovascular Management Strategies: Routine screening  Respiratory Respiratory Symptoms Reported: No symptoms reported Additional Respiratory Details: Pending FLU vaccine on next office visit in about one week Respiratory Management Strategies: Routine screening  Endocrine Endocrine Symptoms Reported: No symptoms reported Is patient diabetic?: Yes Is patient checking blood sugars at home?: Yes List most recent blood sugar readings, include date and time of day: Patient reports Herlene most recent read fasting this morning at 138 ranging from 118-148 with parameter/alert with abnormal readings. Endocrine Self-Management Outcome: 4 (good)  Gastrointestinal Gastrointestinal Symptoms Reported: No symptoms reported      Genitourinary Genitourinary Symptoms Reported: Other (Suprapubic catheter in place) Other Genitourinary Symptoms: Issues with suprapubic catheter in place with RN visits from [facility] weekly. Issues where unable to void this morning with a changing catheter as RN from the agency will revisit to troubleshoot for a urine flow. Genitourinary Management Strategies: Catheter, indwelling  Integumentary Integumentary Symptoms Reported: No symptoms reported Additional Integumentary Details: Ongoing healing of wounds to bilatral feet and left back/hip with dressings managed by [facility] Skin Management Strategies: Dressing changes  Musculoskeletal Musculoskelatal Symptoms Reviewed: Back pain (history of back and leg pain -taking prescribed medication.) Musculoskeletal Management Strategies: Coping strategies, Routine screening, Medication therapy Musculoskeletal Self-Management Outcome: 4 (good)      Psychosocial Psychosocial Symptoms Reported: No symptoms reported Behavioral Management Strategies: Coping strategies, Support system   Quality of Family Relationships: helpful, involved    01/15/2024    PHQ2-9 Depression  Screening   Little interest or pleasure in doing things Not at all  Feeling down, depressed, or hopeless Not at all  PHQ-2 - Total  Score 0  Trouble falling or staying asleep, or sleeping too much    Feeling tired or having little energy    Poor appetite or overeating     Feeling bad about yourself - or that you are a failure or have let yourself or your family down    Trouble concentrating on things, such as reading the newspaper or watching television    Moving or speaking so slowly that other people could have noticed.  Or the opposite - being so fidgety or restless that you have been moving around a lot more than usual    Thoughts that you would be better off dead, or hurting yourself in some way    PHQ2-9 Total Score    If you checked off any problems, how difficult have these problems made it for you to do your work, take care of things at home, or get along with other people    Depression Interventions/Treatment      There were no vitals filed for this visit.  Medications Reviewed Today   Medications were not reviewed in this encounter     Recommendation:   PCP Follow-up Continue Current Plan of Care  Follow Up Plan:   Telephone follow up appointment date/time:  02/13/2024 @ 1:30 pm   Olam Ku, RN, BSN [place]  [facility], [facility] Health RN Care Manager Direct Dial: [phone]  Fax: [phone]

## 2024-01-15 NOTE — Patient Instructions (Signed)
 Visit Information  Thank you for taking time to visit with me today. Please don't hesitate to contact me if I can be of assistance to you before our next scheduled appointment.  Your next care management appointment is by telephone on 02/13/2024 at 1:30 PM  Please call the care guide team at [phone] if you need to cancel, schedule, or reschedule an appointment.   Please call the Suicide and Crisis Lifeline: 988 call the [place]  National Suicide Prevention Lifeline: 3476223008 or TTY: 630-175-5108 TTY (305) 499-7704) to talk to a trained counselor call 1-800-273-TALK (toll free, 24 hour hotline) if you are experiencing a Mental Health or Behavioral Health Crisis or need someone to talk to.   Olam Ku, RN, BSN [place]  [facility], [facility] Health RN Care Manager Direct Dial: [phone]  Fax: [phone]

## 2024-01-16 DIAGNOSIS — F02A18 Dementia in other diseases classified elsewhere, mild, with other behavioral disturbance: Secondary | ICD-10-CM | POA: Diagnosis not present

## 2024-01-17 ENCOUNTER — Telehealth: Payer: Self-pay | Admitting: *Deleted

## 2024-01-17 DIAGNOSIS — E785 Hyperlipidemia, unspecified: Secondary | ICD-10-CM | POA: Diagnosis not present

## 2024-01-17 DIAGNOSIS — G5711 Meralgia paresthetica, right lower limb: Secondary | ICD-10-CM | POA: Diagnosis not present

## 2024-01-17 DIAGNOSIS — H4720 Unspecified optic atrophy: Secondary | ICD-10-CM | POA: Diagnosis not present

## 2024-01-17 DIAGNOSIS — Z0000 Encounter for general adult medical examination without abnormal findings: Secondary | ICD-10-CM | POA: Diagnosis not present

## 2024-01-17 DIAGNOSIS — R0989 Other specified symptoms and signs involving the circulatory and respiratory systems: Secondary | ICD-10-CM | POA: Diagnosis not present

## 2024-01-17 DIAGNOSIS — E1122 Type 2 diabetes mellitus with diabetic chronic kidney disease: Secondary | ICD-10-CM | POA: Diagnosis not present

## 2024-01-17 DIAGNOSIS — E1142 Type 2 diabetes mellitus with diabetic polyneuropathy: Secondary | ICD-10-CM | POA: Diagnosis not present

## 2024-01-17 DIAGNOSIS — Z23 Encounter for immunization: Secondary | ICD-10-CM | POA: Diagnosis not present

## 2024-01-17 DIAGNOSIS — I129 Hypertensive chronic kidney disease with stage 1 through stage 4 chronic kidney disease, or unspecified chronic kidney disease: Secondary | ICD-10-CM | POA: Diagnosis not present

## 2024-01-17 DIAGNOSIS — N1831 Chronic kidney disease, stage 3a: Secondary | ICD-10-CM | POA: Diagnosis not present

## 2024-01-17 DIAGNOSIS — Z79899 Other long term (current) drug therapy: Secondary | ICD-10-CM | POA: Diagnosis not present

## 2024-01-17 NOTE — Patient Outreach (Deleted)
 Complex Care Management   Visit Note  01/17/2024  Name:  Dustin Duffy MRN: [id number] DOB: 1956/02/06  RNCM received a incoming call from caregiver [name] Daring (patient provided permission on previous call to speak with this individual).  This caregiver is responsible for pt's medications and provided all medication pt continue to be adherent with at this time.   Medication list updated accordingly.   Olam Ku, RN, BSN [place]  [facility], [facility] Health RN Care Manager Direct Dial: [phone]  Fax: [phone]

## 2024-01-17 NOTE — Patient Outreach (Signed)
 Complex Care Management   Visit Note  01/17/2024  Name:  Dustin Duffy MRN: [id number] DOB: 10-22-1955  RNCM received a incoming call from caregiver [name] Daring (patient provided permission on previous call to speak with this individual).  This caregiver is responsible for pt's medications and provided all medication pt continue to be adherent with at this time.   Medication list updated accordingly. RNCM attempted outreach to caregiver due to discrepancies with noted medications. Unsuccessful with this call and left a HIPAA message requested a call back to confirm. Will update medication further with confirmation.   Olam Ku, RN, BSN [place]  [facility], [facility] Health RN Care Manager Direct Dial: [phone]  Fax: [phone]

## 2024-01-18 ENCOUNTER — Inpatient Hospital Stay (HOSPITAL_COMMUNITY)

## 2024-01-18 ENCOUNTER — Other Ambulatory Visit: Payer: Self-pay

## 2024-01-18 ENCOUNTER — Inpatient Hospital Stay (HOSPITAL_COMMUNITY)
Admission: EM | Admit: 2024-01-18 | Discharge: 2024-01-21 | DRG: 698 | Disposition: A | Discharge status: Home Health | Attending: Internal Medicine | Admitting: Internal Medicine

## 2024-01-18 ENCOUNTER — Emergency Department (HOSPITAL_COMMUNITY)

## 2024-01-18 ENCOUNTER — Encounter (HOSPITAL_COMMUNITY): Payer: Self-pay | Admitting: Pulmonary Disease

## 2024-01-18 DIAGNOSIS — F0393 Unspecified dementia, unspecified severity, with mood disturbance: Secondary | ICD-10-CM | POA: Diagnosis present

## 2024-01-18 DIAGNOSIS — N179 Acute kidney failure, unspecified: Secondary | ICD-10-CM | POA: Diagnosis not present

## 2024-01-18 DIAGNOSIS — I9589 Other hypotension: Secondary | ICD-10-CM | POA: Diagnosis present

## 2024-01-18 DIAGNOSIS — F430 Acute stress reaction: Secondary | ICD-10-CM | POA: Diagnosis present

## 2024-01-18 DIAGNOSIS — L89220 Pressure ulcer of left hip, unstageable: Secondary | ICD-10-CM | POA: Diagnosis present

## 2024-01-18 DIAGNOSIS — Z1152 Encounter for screening for COVID-19: Secondary | ICD-10-CM | POA: Diagnosis not present

## 2024-01-18 DIAGNOSIS — L89156 Pressure-induced deep tissue damage of sacral region: Secondary | ICD-10-CM | POA: Diagnosis present

## 2024-01-18 DIAGNOSIS — E785 Hyperlipidemia, unspecified: Secondary | ICD-10-CM | POA: Diagnosis present

## 2024-01-18 DIAGNOSIS — Y846 Urinary catheterization as the cause of abnormal reaction of the patient, or of later complication, without mention of misadventure at the time of the procedure: Secondary | ICD-10-CM | POA: Diagnosis present

## 2024-01-18 DIAGNOSIS — R000 Tachycardia, unspecified: Secondary | ICD-10-CM | POA: Diagnosis not present

## 2024-01-18 DIAGNOSIS — R739 Hyperglycemia, unspecified: Secondary | ICD-10-CM

## 2024-01-18 DIAGNOSIS — E1165 Type 2 diabetes mellitus with hyperglycemia: Secondary | ICD-10-CM | POA: Diagnosis present

## 2024-01-18 DIAGNOSIS — F32A Depression, unspecified: Secondary | ICD-10-CM | POA: Diagnosis present

## 2024-01-18 DIAGNOSIS — Z7982 Long term (current) use of aspirin: Secondary | ICD-10-CM

## 2024-01-18 DIAGNOSIS — Z9359 Other cystostomy status: Secondary | ICD-10-CM

## 2024-01-18 DIAGNOSIS — R6521 Severe sepsis with septic shock: Secondary | ICD-10-CM | POA: Diagnosis present

## 2024-01-18 DIAGNOSIS — F0394 Unspecified dementia, unspecified severity, with anxiety: Secondary | ICD-10-CM | POA: Diagnosis present

## 2024-01-18 DIAGNOSIS — R531 Weakness: Secondary | ICD-10-CM | POA: Diagnosis not present

## 2024-01-18 DIAGNOSIS — I129 Hypertensive chronic kidney disease with stage 1 through stage 4 chronic kidney disease, or unspecified chronic kidney disease: Secondary | ICD-10-CM | POA: Diagnosis present

## 2024-01-18 DIAGNOSIS — J9621 Acute and chronic respiratory failure with hypoxia: Secondary | ICD-10-CM | POA: Diagnosis present

## 2024-01-18 DIAGNOSIS — R509 Fever, unspecified: Secondary | ICD-10-CM | POA: Diagnosis not present

## 2024-01-18 DIAGNOSIS — A419 Sepsis, unspecified organism: Principal | ICD-10-CM | POA: Diagnosis present

## 2024-01-18 DIAGNOSIS — N390 Urinary tract infection, site not specified: Secondary | ICD-10-CM | POA: Diagnosis present

## 2024-01-18 DIAGNOSIS — J9601 Acute respiratory failure with hypoxia: Secondary | ICD-10-CM

## 2024-01-18 DIAGNOSIS — H547 Unspecified visual loss: Secondary | ICD-10-CM

## 2024-01-18 DIAGNOSIS — Z66 Do not resuscitate: Secondary | ICD-10-CM | POA: Diagnosis present

## 2024-01-18 DIAGNOSIS — I959 Hypotension, unspecified: Secondary | ICD-10-CM | POA: Diagnosis present

## 2024-01-18 DIAGNOSIS — E119 Type 2 diabetes mellitus without complications: Secondary | ICD-10-CM

## 2024-01-18 DIAGNOSIS — T83518A Infection and inflammatory reaction due to other urinary catheter, initial encounter: Secondary | ICD-10-CM | POA: Diagnosis present

## 2024-01-18 DIAGNOSIS — E2740 Unspecified adrenocortical insufficiency: Secondary | ICD-10-CM | POA: Diagnosis present

## 2024-01-18 DIAGNOSIS — G8929 Other chronic pain: Secondary | ICD-10-CM | POA: Diagnosis present

## 2024-01-18 DIAGNOSIS — F419 Anxiety disorder, unspecified: Secondary | ICD-10-CM | POA: Diagnosis not present

## 2024-01-18 DIAGNOSIS — Z7984 Long term (current) use of oral hypoglycemic drugs: Secondary | ICD-10-CM

## 2024-01-18 DIAGNOSIS — F331 Major depressive disorder, recurrent, moderate: Secondary | ICD-10-CM | POA: Diagnosis present

## 2024-01-18 DIAGNOSIS — N1831 Chronic kidney disease, stage 3a: Secondary | ICD-10-CM | POA: Diagnosis present

## 2024-01-18 DIAGNOSIS — Z7952 Long term (current) use of systemic steroids: Secondary | ICD-10-CM

## 2024-01-18 DIAGNOSIS — Z87440 Personal history of urinary (tract) infections: Secondary | ICD-10-CM

## 2024-01-18 DIAGNOSIS — H548 Legal blindness, as defined in USA: Secondary | ICD-10-CM | POA: Diagnosis present

## 2024-01-18 DIAGNOSIS — Z87891 Personal history of nicotine dependence: Secondary | ICD-10-CM

## 2024-01-18 DIAGNOSIS — N319 Neuromuscular dysfunction of bladder, unspecified: Secondary | ICD-10-CM | POA: Diagnosis present

## 2024-01-18 DIAGNOSIS — E271 Primary adrenocortical insufficiency: Secondary | ICD-10-CM | POA: Diagnosis not present

## 2024-01-18 DIAGNOSIS — M79604 Pain in right leg: Secondary | ICD-10-CM | POA: Diagnosis present

## 2024-01-18 DIAGNOSIS — Z794 Long term (current) use of insulin: Secondary | ICD-10-CM

## 2024-01-18 DIAGNOSIS — J189 Pneumonia, unspecified organism: Secondary | ICD-10-CM | POA: Diagnosis present

## 2024-01-18 DIAGNOSIS — N400 Enlarged prostate without lower urinary tract symptoms: Secondary | ICD-10-CM | POA: Diagnosis present

## 2024-01-18 DIAGNOSIS — F411 Generalized anxiety disorder: Secondary | ICD-10-CM | POA: Diagnosis not present

## 2024-01-18 DIAGNOSIS — E1122 Type 2 diabetes mellitus with diabetic chronic kidney disease: Secondary | ICD-10-CM | POA: Diagnosis present

## 2024-01-18 DIAGNOSIS — E1142 Type 2 diabetes mellitus with diabetic polyneuropathy: Secondary | ICD-10-CM | POA: Diagnosis present

## 2024-01-18 DIAGNOSIS — Z79899 Other long term (current) drug therapy: Secondary | ICD-10-CM

## 2024-01-18 LAB — CBC WITH DIFFERENTIAL/PLATELET
Abs Immature Granulocytes: 0.04 K/uL (ref 0.00–0.07)
Basophils Absolute: 0.1 K/uL (ref 0.0–0.1)
Basophils Relative: 1 %
Eosinophils Absolute: 0 K/uL (ref 0.0–0.5)
Eosinophils Relative: 0 %
HCT: 44.5 % (ref 39.0–52.0)
Hemoglobin: 13.6 g/dL (ref 13.0–17.0)
Immature Granulocytes: 0 %
Lymphocytes Relative: 4 %
Lymphs Abs: 0.4 K/uL — ABNORMAL LOW (ref 0.7–4.0)
MCH: 25 pg — ABNORMAL LOW (ref 26.0–34.0)
MCHC: 30.6 g/dL (ref 30.0–36.0)
MCV: 81.8 fL (ref 80.0–100.0)
Monocytes Absolute: 0.7 K/uL (ref 0.1–1.0)
Monocytes Relative: 7 %
Neutro Abs: 8.1 K/uL — ABNORMAL HIGH (ref 1.7–7.7)
Neutrophils Relative %: 88 %
Platelets: 209 K/uL (ref 150–400)
RBC: 5.44 MIL/uL (ref 4.22–5.81)
RDW: 15.6 % — ABNORMAL HIGH (ref 11.5–15.5)
WBC: 9.2 K/uL (ref 4.0–10.5)
nRBC: 0 % (ref 0.0–0.2)

## 2024-01-18 LAB — I-STAT CG4 LACTIC ACID, ED
Lactic Acid, Venous: 2.5 mmol/L (ref 0.5–1.9)
Lactic Acid, Venous: 2.4 mmol/L (ref 0.5–1.9)

## 2024-01-18 LAB — URINALYSIS, W/ REFLEX TO CULTURE (INFECTION SUSPECTED)
Bilirubin Urine: NEGATIVE
Glucose, UA: 100 mg/dL — AB
Ketones, ur: NEGATIVE mg/dL
Nitrite: POSITIVE — AB
Protein, ur: 30 mg/dL — AB
Specific Gravity, Urine: 1.02 (ref 1.005–1.030)
Squamous Epithelial / HPF: NONE SEEN /HPF (ref 0–5)
pH: 5 (ref 5.0–8.0)

## 2024-01-18 LAB — GLUCOSE, CAPILLARY
Glucose-Capillary: 197 mg/dL — ABNORMAL HIGH (ref 70–99)
Glucose-Capillary: 329 mg/dL — ABNORMAL HIGH (ref 70–99)

## 2024-01-18 LAB — CBC
HCT: 32.4 % — ABNORMAL LOW (ref 39.0–52.0)
Hemoglobin: 10 g/dL — ABNORMAL LOW (ref 13.0–17.0)
MCH: 25.1 pg — ABNORMAL LOW (ref 26.0–34.0)
MCHC: 30.9 g/dL (ref 30.0–36.0)
MCV: 81.4 fL (ref 80.0–100.0)
Platelets: 184 K/uL (ref 150–400)
RBC: 3.98 MIL/uL — ABNORMAL LOW (ref 4.22–5.81)
RDW: 15.4 % (ref 11.5–15.5)
WBC: 16.8 K/uL — ABNORMAL HIGH (ref 4.0–10.5)
nRBC: 0 % (ref 0.0–0.2)

## 2024-01-18 LAB — COMPREHENSIVE METABOLIC PANEL WITH GFR
ALT: 15 U/L (ref 0–44)
AST: 24 U/L (ref 15–41)
Albumin: 2.9 g/dL — ABNORMAL LOW (ref 3.5–5.0)
Alkaline Phosphatase: 93 U/L (ref 38–126)
Anion gap: 9 (ref 5–15)
BUN: 30 mg/dL — ABNORMAL HIGH (ref 8–23)
CO2: 25 mmol/L (ref 22–32)
Calcium: 9 mg/dL (ref 8.9–10.3)
Chloride: 101 mmol/L (ref 98–111)
Creatinine, Ser: 1.57 mg/dL — ABNORMAL HIGH (ref 0.61–1.24)
GFR, Estimated: 47 mL/min — ABNORMAL LOW (ref >60)
Glucose, Bld: 164 mg/dL — ABNORMAL HIGH (ref 70–99)
Potassium: 4.3 mmol/L (ref 3.5–5.1)
Sodium: 136 mmol/L (ref 135–145)
Total Bilirubin: 0.5 mg/dL (ref 0.0–1.2)
Total Protein: 5.7 g/dL — ABNORMAL LOW (ref 6.5–8.1)

## 2024-01-18 LAB — PROTIME-INR
INR: 1.1 (ref 0.8–1.2)
Prothrombin Time: 14.5 s (ref 11.4–15.2)

## 2024-01-18 LAB — LACTIC ACID, PLASMA
Lactic Acid, Venous: 1.7 mmol/L (ref 0.5–1.9)
Lactic Acid, Venous: 2.7 mmol/L (ref 0.5–1.9)

## 2024-01-18 LAB — RESP PANEL BY RT-PCR (RSV, FLU A&B, COVID)  RVPGX2
Influenza A by PCR: NEGATIVE
Influenza B by PCR: NEGATIVE
Resp Syncytial Virus by PCR: NEGATIVE
SARS Coronavirus 2 by RT PCR: NEGATIVE

## 2024-01-18 LAB — CORTISOL: Cortisol, Plasma: 11.7 ug/dL

## 2024-01-18 LAB — CREATININE, SERUM
Creatinine, Ser: 1.56 mg/dL — ABNORMAL HIGH (ref 0.61–1.24)
GFR, Estimated: 47 mL/min — ABNORMAL LOW (ref >60)

## 2024-01-18 LAB — MRSA NEXT GEN BY PCR, NASAL: MRSA by PCR Next Gen: DETECTED — AB

## 2024-01-18 LAB — STREP PNEUMONIAE URINARY ANTIGEN: Strep Pneumo Urinary Antigen: NEGATIVE

## 2024-01-18 MED ORDER — ACETAMINOPHEN 325 MG PO TABS: 650.0000 mg | ORAL_TABLET | ORAL | Status: DC | PRN

## 2024-01-18 MED ORDER — LACTATED RINGERS IV BOLUS (SEPSIS)
1000.0000 mL | Freq: Once | INTRAVENOUS | Status: AC
  Administered 2024-01-18: 1000 mL via INTRAVENOUS

## 2024-01-18 MED ORDER — ASPIRIN 81 MG PO TBEC
81.0000 mg | DELAYED_RELEASE_TABLET | Freq: Every morning | ORAL | Status: DC
  Administered 2024-01-19 – 2024-01-21 (×3): 81 mg via ORAL
  Filled 2024-01-18 (×3): qty 1

## 2024-01-18 MED ORDER — LACTATED RINGERS IV SOLN: INTRAVENOUS | Status: AC

## 2024-01-18 MED ORDER — COLLAGENASE 250 UNIT/GM EX OINT
TOPICAL_OINTMENT | Freq: Every day | CUTANEOUS | Status: DC
  Administered 2024-01-20: 1 via TOPICAL
  Filled 2024-01-18 (×2): qty 30

## 2024-01-18 MED ORDER — OXYCODONE HCL 5 MG PO TABS
5.0000 mg | ORAL_TABLET | ORAL | Status: DC | PRN
Start: 2024-01-18 — End: 2024-01-21
  Administered 2024-01-18 – 2024-01-20 (×5): 5 mg via ORAL
  Filled 2024-01-18 (×6): qty 1

## 2024-01-18 MED ORDER — SODIUM CHLORIDE 0.9 % IV SOLN
500.0000 mg | INTRAVENOUS | Status: AC
  Administered 2024-01-18 – 2024-01-20 (×3): 500 mg via INTRAVENOUS
  Filled 2024-01-18 (×3): qty 5

## 2024-01-18 MED ORDER — MIDODRINE HCL 5 MG PO TABS
10.0000 mg | ORAL_TABLET | Freq: Three times a day (TID) | ORAL | Status: DC
Start: 2024-01-18 — End: 2024-01-19
  Administered 2024-01-18 – 2024-01-19 (×2): 10 mg via ORAL
  Filled 2024-01-18 (×2): qty 2

## 2024-01-18 MED ORDER — PANTOPRAZOLE SODIUM 40 MG PO TBEC
40.0000 mg | DELAYED_RELEASE_TABLET | Freq: Every day | ORAL | Status: DC
  Administered 2024-01-18 – 2024-01-21 (×4): 40 mg via ORAL
  Filled 2024-01-18 (×4): qty 1

## 2024-01-18 MED ORDER — INSULIN ASPART 100 UNIT/ML IJ SOLN
0.0000 [IU] | INTRAMUSCULAR | Status: DC
  Administered 2024-01-18: 7 [IU] via SUBCUTANEOUS
  Administered 2024-01-18 – 2024-01-19 (×2): 2 [IU] via SUBCUTANEOUS
  Administered 2024-01-19 (×2): 3 [IU] via SUBCUTANEOUS
  Filled 2024-01-18: qty 0.09

## 2024-01-18 MED ORDER — VANCOMYCIN HCL IN DEXTROSE 1-5 GM/200ML-% IV SOLN
1000.0000 mg | Freq: Once | INTRAVENOUS | Status: AC
  Administered 2024-01-18: 1000 mg via INTRAVENOUS
  Filled 2024-01-18: qty 200

## 2024-01-18 MED ORDER — METRONIDAZOLE 500 MG/100ML IV SOLN
500.0000 mg | Freq: Once | INTRAVENOUS | Status: AC
  Administered 2024-01-18: 500 mg via INTRAVENOUS
  Filled 2024-01-18: qty 100

## 2024-01-18 MED ORDER — POLYETHYLENE GLYCOL 3350 17 G PO PACK: 17.0000 g | PACK | Freq: Every day | ORAL | Status: DC | PRN

## 2024-01-18 MED ORDER — DONEPEZIL HCL 5 MG PO TABS
5.0000 mg | ORAL_TABLET | Freq: Every day | ORAL | Status: DC
  Administered 2024-01-19 – 2024-01-21 (×3): 5 mg via ORAL
  Filled 2024-01-18 (×3): qty 1

## 2024-01-18 MED ORDER — HYDROCORTISONE SOD SUC (PF) 100 MG IJ SOLR
100.0000 mg | Freq: Three times a day (TID) | INTRAMUSCULAR | Status: DC
  Administered 2024-01-18 – 2024-01-19 (×3): 100 mg via INTRAVENOUS
  Filled 2024-01-18 (×3): qty 2

## 2024-01-18 MED ORDER — SODIUM CHLORIDE 0.9 % IV SOLN
2.0000 g | INTRAVENOUS | Status: AC
  Administered 2024-01-18 – 2024-01-20 (×3): 2 g via INTRAVENOUS
  Filled 2024-01-18 (×3): qty 20

## 2024-01-18 MED ORDER — DOCUSATE SODIUM 100 MG PO CAPS: 100.0000 mg | ORAL_CAPSULE | Freq: Two times a day (BID) | ORAL | Status: DC | PRN

## 2024-01-18 MED ORDER — GABAPENTIN 300 MG PO CAPS
300.0000 mg | ORAL_CAPSULE | Freq: Every day | ORAL | Status: DC
  Administered 2024-01-18 – 2024-01-20 (×3): 300 mg via ORAL
  Filled 2024-01-18 (×3): qty 1

## 2024-01-18 MED ORDER — AMITRIPTYLINE HCL 25 MG PO TABS
50.0000 mg | ORAL_TABLET | Freq: Every day | ORAL | Status: DC
  Administered 2024-01-18 – 2024-01-20 (×3): 50 mg via ORAL
  Filled 2024-01-18 (×3): qty 2

## 2024-01-18 MED ORDER — HEPARIN SODIUM (PORCINE) 5000 UNIT/ML IJ SOLN
5000.0000 [IU] | Freq: Three times a day (TID) | INTRAMUSCULAR | Status: DC
  Administered 2024-01-18 – 2024-01-21 (×10): 5000 [IU] via SUBCUTANEOUS
  Filled 2024-01-18 (×10): qty 1

## 2024-01-18 MED ORDER — CHLORHEXIDINE GLUCONATE CLOTH 2 % EX PADS
6.0000 | MEDICATED_PAD | Freq: Every day | CUTANEOUS | Status: DC
  Administered 2024-01-18 – 2024-01-21 (×4): 6 via TOPICAL

## 2024-01-18 MED ORDER — NOREPINEPHRINE 4 MG/250ML-% IV SOLN
0.0000 ug/min | INTRAVENOUS | Status: DC
  Administered 2024-01-18 (×2): 2 ug/min via INTRAVENOUS
  Filled 2024-01-18: qty 250

## 2024-01-18 MED ORDER — FLUDROCORTISONE ACETATE 0.1 MG PO TABS
0.1000 mg | ORAL_TABLET | Freq: Every day | ORAL | Status: DC
Start: 2024-01-18 — End: 2024-01-20
  Administered 2024-01-18 – 2024-01-20 (×3): 0.1 mg via ORAL
  Filled 2024-01-18 (×3): qty 1

## 2024-01-18 MED ORDER — SODIUM CHLORIDE 0.9 % IV SOLN
100.0000 mg | INTRAVENOUS | Status: DC
  Administered 2024-01-18: 100 mg via INTRAVENOUS
  Filled 2024-01-18: qty 5

## 2024-01-18 MED ORDER — SODIUM CHLORIDE 0.9 % IV SOLN
2.0000 g | Freq: Once | INTRAVENOUS | Status: AC
  Administered 2024-01-18: 2 g via INTRAVENOUS
  Filled 2024-01-18: qty 12.5

## 2024-01-18 MED ORDER — LACTATED RINGERS IV BOLUS (SEPSIS)
500.0000 mL | Freq: Once | INTRAVENOUS | Status: AC
  Administered 2024-01-18: 500 mL via INTRAVENOUS

## 2024-01-18 MED ORDER — SIMVASTATIN 20 MG PO TABS
20.0000 mg | ORAL_TABLET | Freq: Every day | ORAL | Status: DC
  Administered 2024-01-18 – 2024-01-20 (×3): 20 mg via ORAL
  Filled 2024-01-18 (×3): qty 1

## 2024-01-18 MED ORDER — LACTATED RINGERS IV BOLUS
1000.0000 mL | Freq: Once | INTRAVENOUS | Status: AC
  Administered 2024-01-18: 1000 mL via INTRAVENOUS

## 2024-01-18 MED ORDER — MIRABEGRON ER 25 MG PO TB24
50.0000 mg | ORAL_TABLET | Freq: Every morning | ORAL | Status: DC
Start: 2024-01-19 — End: 2024-01-21
  Administered 2024-01-19 – 2024-01-21 (×3): 50 mg via ORAL
  Filled 2024-01-18 (×3): qty 2

## 2024-01-18 MED ORDER — ACETAMINOPHEN 325 MG PO TABS
650.0000 mg | ORAL_TABLET | Freq: Once | ORAL | Status: AC
  Administered 2024-01-18: 650 mg via ORAL
  Filled 2024-01-18: qty 2

## 2024-01-18 NOTE — Progress Notes (Signed)
 PT refused Flutter device- states he has several at home and may have someone bring here. Encouraged deep breathing and coughing.

## 2024-01-18 NOTE — Consult Note (Signed)
 WOC Nurse Consult Note: Reason for Consult: sacrum and hip wounds POA  Wound type: 1. Unstageable Pressure Injury L trochanter 40% dark 40% tan necrotic 20% red moist  2.  Deep Tissue Pressure Injury sacrum purple maroon discoloration  Pressure Injury POA: Yes Measurement: see nursing flowsheet  Wound bed: as above  Drainage (amount, consistency, odor) see nursing flowsheet  Periwound: erythema  Dressing procedure/placement/frequency:  Cleanse L hip wound with Vashe, do not rinse and allow to air dry. Apply 1/4 thick layer of Santyl to wound bed, top with saline moist gauze, dry gauze and silicone foam or ABD pad and tape whichever is preferred.  Cleanse sacrum with soap and water , apply Xeroform gauze Soila [phone]) to purple maroon discoloration daily and secure with silicone foam.   POC discussed with bedside nurse. WOC team will not follow. Re-consult if further needs arise.   Thank you,    Powell Bar MSN, RN-BC, [employer]

## 2024-01-18 NOTE — H&P (Signed)
 NAME:  Dustin Duffy, MRN:  [id number], DOB:  [birth year], LOS: 0 ADMISSION DATE:  01/18/2024, CONSULTATION DATE:  10/23 REFERRING MD:  Dr. Dasie, CHIEF COMPLAINT:  septic shock   History of Present Illness:  Patient is a 68 yo M w/ pertinent PMH chronic urinary retention w/ urinary obstruction and suprapubic catheter in place, chronic hypotension, dmt2, ckd 2, hld presents to [facility] on 10/23 septic shock.  Patient w/ hx of chronic urinary retention w/ recurrent fungal infections and uti. Patient presents to [facility] ED on 10/23 w/ weakness and URI symptoms. On arrival soft bp and febrile 102.56F. Sats 86% placed on supplemental o2. Cultures obtained and started on broad abx. UA w/ positive nitrite, moderate leukocytes, many bacteria. CXR mild opacification over medial right base. Covid/flu/rsv negative. LA 2.5. Despite iv fluids remained hypotensive requiring levo. Pccm consulted for admission.  Pertinent  Medical History   Past Medical History:  Diagnosis Date   BPH without urinary obstruction    Depression    Diabetes mellitus type 2 in nonobese (HCC)    Dyslipidemia    Essential hypertension    Neurogenic bladder    Neuropathy    Pneumonia    Renal impairment    Visual impairment      Significant Hospital Events: Including procedures, antibiotic start and stop dates in addition to other pertinent events   10/23 admit w/ septic shock 2/2 pna and uti on levo  Interim History / Subjective:  See above  Objective    Blood pressure 92/65, pulse 86, temperature 98.1 F (36.7 C), temperature source Oral, resp. rate (!) 23, SpO2 97%.        Intake/Output Summary (Last 24 hours) at 01/18/2024 1344 Last data filed at 01/18/2024 1325 Gross per 24 hour  Intake 202.51 ml  Output --  Net 202.51 ml   There were no vitals filed for this visit.  Examination: General:   ill appearing male in NAD HEENT: MM pink/dry; [place] in place Neuro: Aox3; MAE CV: s1s2, RRR, no m/r/g PULM:  dim clear  BS bilaterally; [place] 6L GI: soft, bsx4 active  Extremities: warm/dry, no edema  Skin: no rashes or lesions    Resolved problem list   Assessment and Plan   Septic shock 2/2 uti and pna Chronic hypotension?: on midodrine  and florinef  Plan: -cont levo for map goal >65 -fluids -trend la -check cortisol; start stress dose -resume home florinef  and midodrine  -follow bcx2 and uc; send mrsa pcr, rvp, urine strep/legionella, exp sputum -rocephin zen[place]ia -micafungin given hx of fungal uti  Acute respiratory failure w/ hypoxia Possible pna Plan: -[place] for sats >92% -pulm toilet -pt/ot -treat for pna as above  DMT2 -A1c 3 months ago 8.9 Plan: -ssi and cbg monitoring  CKD 2 Chronic urinary retention w/ suprapubic catheter in place Plan: -iv fluids -Trend BMP / urinary output -Replace electrolytes as indicated -Avoid nephrotoxic agents, ensure adequate renal perfusion  HLD Plan: -statin  Depression Dementia? Plan: -resume home elavil  and aricept  Peripheral neuropathy Plan: -hold home gabapentin  for now  GOC Plan: -patient states does not want compressions, shock, or intubation and would like to be DNR/DNI. Updated sister [name] over phone.    Labs   CBC: Recent Labs  Lab 01/18/24 1043  WBC 9.2  NEUTROABS 8.1*  HGB 13.6  HCT 44.5  MCV 81.8  PLT 209    Basic Metabolic Panel: Recent Labs  Lab 01/18/24 1232  NA 136  K 4.3  CL 101  CO2 25  GLUCOSE 164*  BUN 30*  CREATININE 1.57*  CALCIUM 9.0   GFR: CrCl cannot be calculated (Unknown ideal weight.). Recent Labs  Lab 01/18/24 1043 01/18/24 1100  WBC 9.2  --   LATICACIDVEN  --  2.5*    Liver Function Tests: Recent Labs  Lab 01/18/24 1232  AST 24  ALT 15  ALKPHOS 93  BILITOT 0.5  PROT 5.7*  ALBUMIN  2.9*   No results for input(s): LIPASE, AMYLASE in the last 168 hours. No results for input(s): AMMONIA in the last 168 hours.  ABG No results found for: PHART, PCO2ART,  PO2ART, HCO3, TCO2, ACIDBASEDEF, O2SAT   Coagulation Profile: Recent Labs  Lab 01/18/24 1043  INR 1.1    Cardiac Enzymes: No results for input(s): CKTOTAL, CKMB, CKMBINDEX, TROPONINI in the last 168 hours.  HbA1C: Hgb A1c MFr Bld  Date/Time Value Ref Range Status  10/19/2023 09:45 AM 8.9 (H) 4.8 - 5.6 % Final    Comment:    (NOTE) Diagnosis of Diabetes The following HbA1c ranges recommended by the American Diabetes Association (ADA) may be used as an aid in the diagnosis of diabetes mellitus.  Hemoglobin             Suggested A1C NGSP%              Diagnosis  <5.7                   Non Diabetic  5.7-6.4                Pre-Diabetic  >6.4                   Diabetic  <7.0                   Glycemic control for                       adults with diabetes.    04/12/2023 06:15 PM 8.0 (H) 4.8 - 5.6 % Final    Comment:    (NOTE) Pre diabetes:          5.7%-6.4%  Diabetes:              >6.4%  Glycemic control for   <7.0% adults with diabetes     CBG: No results for input(s): GLUCAP in the last 168 hours.  Review of Systems:   Review of Systems  Constitutional:  Negative for fever.  Respiratory:  Positive for cough, sputum production and shortness of breath.   Cardiovascular:  Negative for chest pain.  Gastrointestinal:  Negative for abdominal pain, nausea and vomiting.     Past Medical History:  He,  has a past medical history of BPH without urinary obstruction, Depression, Diabetes mellitus type 2 in nonobese (HCC), Dyslipidemia, Essential hypertension, Neurogenic bladder, Neuropathy, Pneumonia, Renal impairment, and Visual impairment.   Surgical History:   Past Surgical History:  Procedure Laterality Date   BLADDER INSTILLATION N/A 10/23/2023   Procedure: INSTILLATION, BLADDER;  Surgeon: Carolee Sherwood JONETTA DOUGLAS, MD;  Location: [facility];  Service: Urology;  Laterality: N/A;  BLADDER WASH WITH  AMPHOTERICIN B    CYSTOSCOPY N/A 10/23/2023    Procedure: CYSTOSCOPY;  Surgeon: Carolee Sherwood JONETTA DOUGLAS, MD;  Location: [facility];  Service: Urology;  Laterality: N/A;   IR CYSTOSTOMY TUBE PLACEMENT/BLADDER ASPIRATION  09/13/2023   ORIF HUMERUS FRACTURE Left 06/22/2023   Procedure: OPEN REDUCTION INTERNAL FIXATION (ORIF) PROXIMAL HUMERUS FRACTURE;  Surgeon: Josefina,  Fonda, MD;  Location: [facility];  Service: Orthopedics;  Laterality: Left;     Social History:   reports that he quit smoking about 13 years ago. His smoking use included cigarettes. He started smoking about 48 years ago. He has a 35 pack-year smoking history. He has never been exposed to tobacco smoke. He has never used smokeless tobacco. He reports that he does not currently use alcohol. He reports that he does not currently use drugs.   Family History:  His family history is not on file.   Allergies No Known Allergies   Home Medications  Prior to Admission medications   Medication Sig Start Date End Date Taking? Authorizing Provider  acetaminophen  (TYLENOL ) 325 MG tablet Take 2 tablets (650 mg total) by mouth every 6 (six) hours as needed for mild pain (pain score 1-3) (or Fever >/= 101). 04/15/23   Barbarann Nest, MD  amitriptyline  (ELAVIL ) 50 MG tablet Take 50 mg by mouth at bedtime.    [provider]  aspirin  EC 81 MG tablet Take 81 mg by mouth in the morning. Swallow whole.    [provider]  cholecalciferol (VITAMIN D3) 25 MCG (1000 UNIT) tablet Take 1,000 Units by mouth every Monday, Wednesday, and Friday.    [provider]  clotrimazole  (LOTRIMIN ) 1 % cream Keep skin of perineal area very clean/dry. Apply thin coat of clotrimazole  cream to area 2x/day for one week. 10/15/23   Bernard Drivers, MD  docusate sodium  (COLACE) 100 MG capsule Take 100 mg by mouth 2 (two) times daily.    [provider]  donepezil (ARICEPT) 5 MG tablet Take 5 mg by mouth daily.    [provider]  fluconazole  (DIFLUCAN ) 200 MG tablet Take 1 tablet (200 mg  total) by mouth daily. 10/23/23   Carolee Sherwood JONETTA DOUGLAS, MD  fludrocortisone  (FLORINEF ) 0.1 MG tablet Take 1 tablet (0.1 mg total) by mouth daily. 06/09/22   Jillian Buttery, MD  gabapentin  (NEURONTIN ) 300 MG capsule Take 900 mg by mouth at bedtime. 02/05/21   [provider]  ipratropium (ATROVENT ) 0.03 % nasal spray Place 2 sprays into both nostrils 2 (two) times daily as needed for rhinitis.    [provider]  JANUVIA 50 MG tablet Take 50 mg by mouth in the morning.    [provider]  LANTUS 100 UNIT/ML injection Inject 12 Units into the skin in the morning. Patient taking differently: Inject 12 Units into the skin in the morning. 08/18/23   [provider]  metFORMIN (GLUCOPHAGE-XR) 500 MG 24 hr tablet Take 500 mg by mouth daily.    [provider]  midodrine  (PROAMATINE ) 5 MG tablet Take 1 tablet (5 mg total) by mouth 3 (three) times daily with meals. 06/09/22   Jillian Buttery, MD  MYRBETRIQ  50 MG TB24 tablet Take 50 mg by mouth in the morning.    [provider]  oxyCODONE  (ROXICODONE ) 5 MG immediate release tablet Take 1 tablet (5 mg total) by mouth every 4 (four) hours as needed for severe pain (pain score 7-10). 06/24/23   Gherghe, Costin M, MD  polyethylene glycol (MIRALAX  / GLYCOLAX ) 17 g packet Take 17 g by mouth daily. Patient not taking: Reported on 01/17/2024    [provider]  simvastatin  (ZOCOR ) 20 MG tablet Take 1 tablet (20 mg total) by mouth every evening. Do not take this medication until you finish Diflucan . Patient taking differently: Take 20 mg by mouth at bedtime. 05/02/22   Jadine,  Toribio SQUIBB, MD  Water  For Irrigation, Sterile (STERILE WATER ) Irrigate with as directed See admin instructions. Mix sterile water  with white vinegar 4:1 and flush indwelling catheter nightly before bedtime    [provider]  XIGDUO XR 10-500 MG TB24 Take 1 tablet by mouth daily before breakfast.    [provider]      Critical care time: 45 minutes     JD Emilio RIGGERS [facility] 01/18/2024, 1:44 PM  Please see [email or web address] for pager details.  From 7A-7P if no response, please call [phone]. After hours, please call ELink [phone].

## 2024-01-18 NOTE — ED Provider Notes (Signed)
 [place] EMERGENCY DEPARTMENT AT [facility] Provider Note   CSN: [id number] Arrival date & time: 01/18/24  1031     Patient presents with: Weakness   Dustin Duffy is a 68 y.o. male.   68 year old male presents via EMS due to weakness that began today.  Patient states that he has had URI symptoms as well as history of UTIs in the past.  Does have a suprapubic catheter.  Denies any vomiting or diarrhea.  No flank pain.  No abdominal discomfort.  No chest discomfort.  No ear pain or sore throat.  Called EMS and patient was found to be febrile to 102.1.  No medications given.  Was also found to be hypoxic with pulse oximetry of 86%.  Patient placed on supplemental oxygen  and transported here       Prior to Admission medications   Medication Sig Start Date End Date Taking? Authorizing Provider  acetaminophen  (TYLENOL ) 325 MG tablet Take 2 tablets (650 mg total) by mouth every 6 (six) hours as needed for mild pain (pain score 1-3) (or Fever >/= 101). 04/15/23   Barbarann Nest, MD  amitriptyline  (ELAVIL ) 50 MG tablet Take 50 mg by mouth at bedtime.    [provider]  aspirin  EC 81 MG tablet Take 81 mg by mouth in the morning. Swallow whole.    [provider]  cholecalciferol (VITAMIN D3) 25 MCG (1000 UNIT) tablet Take 1,000 Units by mouth every Monday, Wednesday, and Friday.    [provider]  clotrimazole  (LOTRIMIN ) 1 % cream Keep skin of perineal area very clean/dry. Apply thin coat of clotrimazole  cream to area 2x/day for one week. 10/15/23   Bernard Drivers, MD  Continuous Glucose Sensor (FREESTYLE LIBRE 2 SENSOR) MISC Inject 1 Device into the skin continuous.    [provider]  docusate sodium  (COLACE) 100 MG capsule Take 100 mg by mouth 2 (two) times daily.    [provider]  fluconazole  (DIFLUCAN ) 200 MG tablet Take 1 tablet (200 mg total) by mouth daily. 10/23/23   Carolee Sherwood JONETTA DOUGLAS, MD  fludrocortisone  (FLORINEF ) 0.1 MG  tablet Take 1 tablet (0.1 mg total) by mouth daily. 06/09/22   Jillian Buttery, MD  gabapentin  (NEURONTIN ) 300 MG capsule Take 900 mg by mouth at bedtime. 02/05/21   [provider]  ipratropium (ATROVENT ) 0.03 % nasal spray Place 2 sprays into both nostrils 2 (two) times daily as needed for rhinitis.    [provider]  JANUVIA 50 MG tablet Take 50 mg by mouth in the morning.    [provider]  LANTUS 100 UNIT/ML injection Inject 12 Units into the skin in the morning. Patient taking differently: Inject 12 Units into the skin in the morning. 08/18/23   [provider]  midodrine  (PROAMATINE ) 5 MG tablet Take 1 tablet (5 mg total) by mouth 3 (three) times daily with meals. 06/09/22   Jillian Buttery, MD  MYRBETRIQ  50 MG TB24 tablet Take 50 mg by mouth in the morning.    [provider]  oxyCODONE  (ROXICODONE ) 5 MG immediate release tablet Take 1 tablet (5 mg total) by mouth every 4 (four) hours as needed for severe pain (pain score 7-10). 06/24/23   Gherghe, Costin M, MD  polyethylene glycol (MIRALAX  / GLYCOLAX ) 17 g packet Take 17 g by mouth daily. Patient not taking: Reported on 01/17/2024    [provider]  simvastatin  (ZOCOR ) 20 MG tablet Take 1 tablet (20 mg total) by mouth  every evening. Do not take this medication until you finish Diflucan . Patient taking differently: Take 20 mg by mouth at bedtime. 05/02/22   Jadine Toribio SQUIBB, MD  Water  For Irrigation, Sterile (STERILE WATER ) Irrigate with as directed See admin instructions. Mix sterile water  with white vinegar 4:1 and flush indwelling catheter nightly before bedtime    [provider]  XIGDUO XR 10-500 MG TB24 Take 1 tablet by mouth daily before breakfast.    [provider]    Allergies: Patient has no known allergies.    Review of Systems  All other systems reviewed and are negative.   Updated Vital Signs BP 108/72 (BP Location: Left Arm)   Pulse (!) 126   Temp  99.6 F (37.6 C) (Oral)   Resp (!) 28   SpO2 90%   Physical Exam Vitals and nursing note reviewed.  Constitutional:      General: He is not in acute distress.    Appearance: Normal appearance. He is well-developed. He is not toxic-appearing.  HENT:     Head: Normocephalic and atraumatic.  Eyes:     General: Lids are normal.     Conjunctiva/sclera: Conjunctivae normal.     Pupils: Pupils are equal, round, and reactive to light.  Neck:     Thyroid : No thyroid  mass.     Trachea: No tracheal deviation.  Cardiovascular:     Rate and Rhythm: Regular rhythm. Tachycardia present.     Heart sounds: Normal heart sounds. No murmur heard.    No gallop.  Pulmonary:     Effort: Pulmonary effort is normal. Tachypnea present. No respiratory distress.     Breath sounds: No stridor. Wheezing present. No decreased breath sounds, rhonchi or rales.  Abdominal:     General: There is no distension.     Palpations: Abdomen is soft.     Tenderness: There is no abdominal tenderness. There is no rebound.  Musculoskeletal:        General: No tenderness. Normal range of motion.     Cervical back: Normal range of motion and neck supple.  Skin:    General: Skin is warm and dry.     Findings: No abrasion or rash.  Neurological:     Mental Status: He is alert and oriented to person, place, and time. Mental status is at baseline.     GCS: GCS eye subscore is 4. GCS verbal subscore is 5. GCS motor subscore is 6.     Cranial Nerves: No cranial nerve deficit.     Sensory: No sensory deficit.     Motor: Motor function is intact.  Psychiatric:        Attention and Perception: Attention normal.        Speech: Speech normal.        Behavior: Behavior normal.     (all labs ordered are listed, but only abnormal results are displayed) Labs Reviewed  RESP PANEL BY RT-PCR (RSV, FLU A&B, COVID)  RVPGX2  CULTURE, BLOOD (ROUTINE X 2)  CULTURE, BLOOD (ROUTINE X 2)  COMPREHENSIVE METABOLIC PANEL WITH GFR  CBC  WITH DIFFERENTIAL/PLATELET  PROTIME-INR  URINALYSIS, W/ REFLEX TO CULTURE (INFECTION SUSPECTED)  I-STAT CG4 LACTIC ACID, ED    EKG: EKG Interpretation Date/Time:  Thursday January 18 2024 10:45:34 EDT Ventricular Rate:  123 PR Interval:  157 QRS Duration:  129 QT Interval:  322 QTC Calculation: 461 R Axis:   121  Text Interpretation: Sinus tachycardia Ventricular premature complex Right bundle branch block No  significant change since last tracing Confirmed by Dasie Faden ([id number]) on 01/18/2024 11:56:18 AM  Radiology: No results found.   Procedures   Medications Ordered in the ED  lactated ringers  infusion (has no administration in time range)  lactated ringers  bolus 1,000 mL (has no administration in time range)    And  lactated ringers  bolus 500 mL (has no administration in time range)  ceFEPIme (MAXIPIME) 2 g in sodium chloride  0.9 % 100 mL IVPB (has no administration in time range)  metroNIDAZOLE (FLAGYL) IVPB 500 mg (has no administration in time range)  vancomycin (VANCOCIN) IVPB 1000 mg/200 mL premix (has no administration in time range)  acetaminophen  (TYLENOL ) tablet 650 mg (has no administration in time range)                                    Medical Decision Making Amount and/or Complexity of Data Reviewed Labs: ordered. Radiology: ordered.  Risk OTC drugs. Prescription drug management.   Patient is EKG shows sinus tachycardia.  Chest x-ray consistent with likely pneumonia.  He still requires oxygen  at this time at 6 L.  He is awake alert and mentating appropriately.  Temperature was 99.6 orally.  Given Tylenol .  CBC reassuring with normal white count however he does have elevated lactate 2.5.  He has sepsis protocol started on arrival.  Urinalysis does show 11-20 white cells positive nitrite positive leukocyte esterase however patient does have a chronic indwelling suprapubic catheter.  Culture sent.  Given IV fluid bolus 20/kg.  Also started on empiric  antibiotics.  Unfortunately blood pressure continued to trend downwards and he was started on Levophed for pressor support.  Will consult critical care for admission  CRITICAL CARE Performed by: Faden ONEIDA Dasie Total critical care time: 55 minutes Critical care time was exclusive of separately billable procedures and treating other patients. Critical care was necessary to treat or prevent imminent or life-threatening deterioration. Critical care was time spent personally by me on the following activities: development of treatment plan with patient and/or surrogate as well as nursing, discussions with consultants, evaluation of patient's response to treatment, examination of patient, obtaining history from patient or surrogate, ordering and performing treatments and interventions, ordering and review of laboratory studies, ordering and review of radiographic studies, pulse oximetry and re-evaluation of patient's condition.      Final diagnoses:  None    ED Discharge Orders     None          Dasie Faden, MD 01/18/24 1316

## 2024-01-18 NOTE — Sepsis Progress Note (Signed)
 Elink monitoring for the code sepsis protocol.

## 2024-01-18 NOTE — ED Notes (Signed)
 Pt sats 88-90% 4L [place], spoke with respiratory, added humidified solution and increased 02 to 6L. Will continue to monitor.

## 2024-01-18 NOTE — Progress Notes (Signed)
 eLink Physician-Brief Progress Note Patient Name: Dustin Duffy DOB: 1956-03-18 MRN: [id number]   Date of Service  01/18/2024  HPI/Events of Note  Chronic lower extremity pain on oxycodone  and gabapentin  at home  eICU Interventions  Resume gabapentin , resume home oxycodone      Intervention Category Intermediate Interventions: Pain - evaluation and management  Branden Vine 01/18/2024, 11:20 PM

## 2024-01-18 NOTE — ED Triage Notes (Signed)
 Pt BIBA from home for weakness that started today.  Per EMS pt was well yesterday afternoon.  Pt reports having difficulty getting into wheelchair earlier today.  Denies fever, sick contacts.  Chronic foley

## 2024-01-19 LAB — CBC
HCT: 32.3 % — ABNORMAL LOW (ref 39.0–52.0)
Hemoglobin: 10.3 g/dL — ABNORMAL LOW (ref 13.0–17.0)
MCH: 26.2 pg (ref 26.0–34.0)
MCHC: 31.9 g/dL (ref 30.0–36.0)
MCV: 82.2 fL (ref 80.0–100.0)
Platelets: 176 K/uL (ref 150–400)
RBC: 3.93 MIL/uL — ABNORMAL LOW (ref 4.22–5.81)
RDW: 15.6 % — ABNORMAL HIGH (ref 11.5–15.5)
WBC: 15 K/uL — ABNORMAL HIGH (ref 4.0–10.5)
nRBC: 0 % (ref 0.0–0.2)

## 2024-01-19 LAB — GLUCOSE, CAPILLARY
Glucose-Capillary: 195 mg/dL — ABNORMAL HIGH (ref 70–99)
Glucose-Capillary: 218 mg/dL — ABNORMAL HIGH (ref 70–99)
Glucose-Capillary: 228 mg/dL — ABNORMAL HIGH (ref 70–99)
Glucose-Capillary: 229 mg/dL — ABNORMAL HIGH (ref 70–99)
Glucose-Capillary: 242 mg/dL — ABNORMAL HIGH (ref 70–99)
Glucose-Capillary: 368 mg/dL — ABNORMAL HIGH (ref 70–99)

## 2024-01-19 LAB — RESPIRATORY PANEL BY PCR

## 2024-01-19 LAB — BASIC METABOLIC PANEL WITH GFR
Anion gap: 9 (ref 5–15)
BUN: 27 mg/dL — ABNORMAL HIGH (ref 8–23)
CO2: 26 mmol/L (ref 22–32)
Calcium: 8.6 mg/dL — ABNORMAL LOW (ref 8.9–10.3)
Chloride: 101 mmol/L (ref 98–111)
Creatinine, Ser: 1.16 mg/dL (ref 0.61–1.24)
GFR, Estimated: >60 mL/min (ref >60)
Glucose, Bld: 215 mg/dL — ABNORMAL HIGH (ref 70–99)
Potassium: 4.5 mmol/L (ref 3.5–5.1)
Sodium: 135 mmol/L (ref 135–145)

## 2024-01-19 MED ORDER — INSULIN ASPART 100 UNIT/ML IJ SOLN
0.0000 [IU] | Freq: Three times a day (TID) | INTRAMUSCULAR | Status: DC
  Administered 2024-01-19: 5 [IU] via SUBCUTANEOUS
  Administered 2024-01-19: 15 [IU] via SUBCUTANEOUS
  Administered 2024-01-20: 11 [IU] via SUBCUTANEOUS
  Administered 2024-01-20 (×2): 8 [IU] via SUBCUTANEOUS
  Administered 2024-01-21: 5 [IU] via SUBCUTANEOUS
  Administered 2024-01-21: 2 [IU] via SUBCUTANEOUS

## 2024-01-19 MED ORDER — FLUCONAZOLE 200 MG PO TABS
200.0000 mg | ORAL_TABLET | Freq: Every day | ORAL | Status: DC
  Administered 2024-01-19 – 2024-01-20 (×2): 200 mg via ORAL
  Filled 2024-01-19: qty 1
  Filled 2024-01-19: qty 2

## 2024-01-19 MED ORDER — HYDROCORTISONE SOD SUC (PF) 100 MG IJ SOLR
100.0000 mg | Freq: Two times a day (BID) | INTRAMUSCULAR | Status: AC
  Administered 2024-01-19 – 2024-01-20 (×3): 100 mg via INTRAVENOUS
  Filled 2024-01-19 (×4): qty 2

## 2024-01-19 MED ORDER — ONDANSETRON HCL 4 MG/2ML IJ SOLN
4.0000 mg | Freq: Four times a day (QID) | INTRAMUSCULAR | Status: DC | PRN
  Administered 2024-01-19: 4 mg via INTRAVENOUS
  Filled 2024-01-19 (×2): qty 2

## 2024-01-19 MED ORDER — MIDODRINE HCL 5 MG PO TABS
5.0000 mg | ORAL_TABLET | Freq: Three times a day (TID) | ORAL | Status: DC
  Administered 2024-01-20 – 2024-01-21 (×3): 5 mg via ORAL
  Filled 2024-01-19 (×3): qty 1

## 2024-01-19 MED ORDER — INSULIN ASPART 100 UNIT/ML IJ SOLN
0.0000 [IU] | Freq: Every day | INTRAMUSCULAR | Status: DC
  Administered 2024-01-19: 2 [IU] via SUBCUTANEOUS

## 2024-01-19 MED ORDER — INSULIN GLARGINE-YFGN 100 UNIT/ML ~~LOC~~ SOLN: 8.0000 [IU] | Freq: Every day | SUBCUTANEOUS | Status: DC

## 2024-01-19 NOTE — Progress Notes (Signed)
 Physical Therapy Treatment Patient Details Name: Dustin Duffy MRN: [id number] DOB: 12-Apr-1955 Today's Date: 01/19/2024   History of Present Illness Pt is a 68 y.o. male admitted with weakness,  fever, hypoxia, sepsis, pneumonia. L hip pressure injuryPMH: UTI, suprapubic catheter, AKI ,for DM Type II, HTN, legally blind, neuropathy and chronic renal insufficiency.    PT Comments  Patient provided blue Tb and instructed in resistive U/LE while seated in recliner. Patient reports that he will  be performing exercises as he does daily at home. Recommend HHPT to continue.    If plan is discharge home, recommend the following: A little help with walking and/or transfers;Assist for transportation;Help with stairs or ramp for entrance   Can travel by private vehicle        Equipment Recommendations  None recommended by PT    Recommendations for Other Services       Precautions / Restrictions Precautions Precautions: Fall Precaution/Restrictions Comments: suprpubic catheter, trying to wean from O2 Restrictions Weight Bearing Restrictions Per Provider Order: No     Mobility  Bed Mobility           General bed mobility comments: in recliner    Transfers  Ambulation/Gait                   Stairs             Wheelchair Mobility     Tilt Bed    Modified Rankin (Stroke Patients Only)       Balance Overall balance assessment: History of Falls, Needs assistance Sitting-balance support: No upper extremity supported, Feet supported Sitting balance-Leahy Scale: Good     Standing balance support: Single extremity supported Standing balance-Leahy Scale: Poor                              Communication Communication Communication: No apparent difficulties  Cognition Arousal: Alert Behavior During Therapy: WFL for tasks assessed/performed   PT - Cognitive impairments: No apparent impairments                         Following  commands: Intact      Cueing    Exercises General Exercises - Lower Extremity Long Arc Quad: AROM, Both, 10 reps, Seated Hip Flexion/Marching: AROM, 10 reps, Seated Other Exercises Other Exercises: blue TB LE exercise instruction, for ABD, HIP/knee extension while seated. Also able to stabilize TB on foot and perform UE resistive exercises for elb flex, shoulder flex and ABD    General Comments        Pertinent Vitals/Pain Pain Assessment Pain Assessment: No/denies pain    Home Living Family/patient expects to be discharged to:: Private residence Living Arrangements: Alone Available Help at Discharge: Family;Friend(s) Type of Home: House Home Access: Stairs to enter Entrance Stairs-Rails: Right;Left;Can reach both Entrance Stairs-Number of Steps: 3+1   Home Layout: One level Home Equipment: Wheelchair - Forensic psychologist (2 wheels)      Prior Function            PT Goals (current goals can now be found in the care plan section) Acute Rehab PT Goals Patient Stated Goal: go home PT Goal Formulation: With patient Time For Goal Achievement: 02/02/24 Potential to Achieve Goals: Good Progress towards PT goals: Progressing toward goals    Frequency    Min 3X/week      PT Plan      Co-evaluation  AM-PAC PT 6 Clicks Mobility   Outcome Measure  Help needed turning from your back to your side while in a flat bed without using bedrails?: None Help needed moving from lying on your back to sitting on the side of a flat bed without using bedrails?: None Help needed moving to and from a bed to a chair (including a wheelchair)?: A Little Help needed standing up from a chair using your arms (e.g., wheelchair or bedside chair)?: A Little Help needed to walk in hospital room?: A Lot Help needed climbing 3-5 steps with a railing? : Total 6 Click Score: 17    End of Session Equipment Utilized During Treatment: Gait belt Activity Tolerance:  Patient tolerated treatment well Patient left: in chair;with call bell/phone within reach;with chair alarm set Nurse Communication: Mobility status PT Visit Diagnosis: Unsteadiness on feet (R26.81);Muscle weakness (generalized) (M62.81)     Time: 8854-8844 PT Time Calculation (min) (ACUTE ONLY): 10 min  Charges:    $Therapeutic Exercise: 8-22 mins PT General Charges $$ ACUTE PT VISIT: 1 Visit                     Darice Potters PT Acute Rehabilitation Services Office [phone]    Potters Darice Norris 01/19/2024, 12:39 PM

## 2024-01-19 NOTE — Progress Notes (Addendum)
 NAME:  Dustin Duffy, MRN:  [id number], DOB:  1955-10-30, LOS: 1 ADMISSION DATE:  01/18/2024, CONSULTATION DATE:  10/23 REFERRING MD:  Dr. Dasie, CHIEF COMPLAINT:  septic shock   History of Present Illness:  Patient is a 68 yo M w/ pertinent PMH chronic urinary retention w/ urinary obstruction and suprapubic catheter in place, chronic hypotension, dmt2, ckd 2, hld presents to [facility] on 10/23 septic shock.  Patient w/ hx of chronic urinary retention w/ recurrent fungal infections and uti. Patient presents to [facility] ED on 10/23 w/ weakness and URI symptoms. On arrival soft bp and febrile 102.16F. Sats 86% placed on supplemental o2. Cultures obtained and started on broad abx. UA w/ positive nitrite, moderate leukocytes, many bacteria. CXR mild opacification over medial right base. Covid/flu/rsv negative. LA 2.5. Despite iv fluids remained hypotensive requiring levo. Pccm consulted for admission.  Pertinent  Medical History   Past Medical History:  Diagnosis Date   BPH without urinary obstruction    Depression    Diabetes mellitus type 2 in nonobese (HCC)    Dyslipidemia    Essential hypertension    Neurogenic bladder    Neuropathy    Pneumonia    Renal impairment    Visual impairment      Significant Hospital Events: Including procedures, antibiotic start and stop dates in addition to other pertinent events   10/23 admit w/ septic shock 2/2 pna and uti on levo; weaned off levo overnight 10/24 transfer out of icu  Interim History / Subjective:  Off levo last night On 4L [place]  Objective    Blood pressure 95/62, pulse 82, temperature (!) 96.8 F (36 C), temperature source Axillary, resp. rate 19, height 5' 8 (1.727 m), weight 64.4 kg, SpO2 93%.        Intake/Output Summary (Last 24 hours) at 01/19/2024 0711 Last data filed at 01/19/2024 9476 Gross per 24 hour  Intake 4374.82 ml  Output 1300 ml  Net 3074.82 ml   Filed Weights   01/18/24 1550 01/19/24 0500  Weight: 61.2 kg 64.4  kg    Examination: General:   NAD HEENT: MM pink/dry; [place] in place Neuro: Aox3; MAE CV: s1s2, RRR, no m/r/g PULM:  dim clear BS bilaterally; [place] 4L GI: soft, bsx4 active  Extremities: warm/dry, no edema  Skin: no rashes or lesions    Resolved problem list   Assessment and Plan   Septic shock 2/2 uti and pna Chronic hypotension?: on midodrine  and florinef  Plan: -off levo overnight; map goal >65 -po fluids -trend la -cortisol wnl and off pressors; wean stress dose steroids -cont home florinef  and midodrine  -follow bcx2 and uc; f/u urine legionella and exp sputum -rocephin /azithro -micafungin given hx of fungal uti  Acute respiratory failure w/ hypoxia Possible pna Plan: -[place] for sats >92% -pulm toilet -pt/ot -treat for pna as above  DMT2 -A1c 3 months ago 8.9 Plan: -increase ssi and cbg monitoring  CKD 2 Chronic urinary retention w/ suprapubic catheter in place Plan: -Trend BMP / urinary output -Replace electrolytes as indicated -Avoid nephrotoxic agents, ensure adequate renal perfusion  HLD Plan: -statin  Depression Dementia? Plan: -resume home elavil  and aricept  Peripheral neuropathy Plan: -cont home gabapentin  at lower dose  GOC Plan: -patient states does not want compressions, shock, or intubation and would like to be DNR/DNI. Updated sister [name] over phone on 10/23      Critical care time: NA     JD Emilio RIGGERS [facility] 01/19/2024, 7:11  AM  Please see [email or web address] for pager details.  From 7A-7P if no response, please call [phone]. After hours, please call ELink [phone].

## 2024-01-19 NOTE — Evaluation (Signed)
 Physical Therapy Evaluation Patient Details Name: Dustin Duffy MRN: [id number] DOB: 1955-10-15 Today's Date: 01/19/2024  History of Present Illness  Pt is a 68 y.o. male admitted with weakness,  fever, hypoxia, sepsis, pneumonia. L hip pressure injuryPMH: UTI, suprapubic catheter, AKI ,for DM Type II, HTN, legally blind, neuropathy and chronic renal insufficiency.  Clinical Impression  Pt admitted with above diagnosis.  Pt currently with functional limitations due to the deficits listed below (see PT Problem List). Pt will benefit from acute skilled PT to increase their independence and safety with mobility to allow discharge.     The patient is eager to get OOB and start his HEP. Patient demonstrated  what he performs at home x 60 reps with LE's while seated.   Patient reports having HHPT onboard.  Patient  reports modified independent from manual Wc, using LE's for propulsion. Patient reports that he has 2 rails in his hall that he ambulates when someone assists and pushes WC behind. The patient should progress to DC home.  SPo2 on RA with mobility dropped top 87%, replaced back on 3 L, back to 94%. BP 140/98      If plan is discharge home, recommend the following: A little help with walking and/or transfers;Assist for transportation;Help with stairs or ramp for entrance   Can travel by private vehicle        Equipment Recommendations None recommended by PT  Recommendations for Other Services       Functional Status Assessment Patient has had a recent decline in their functional status and demonstrates the ability to make significant improvements in function in a reasonable and predictable amount of time.     Precautions / Restrictions Precautions Precautions: Fall Precaution/Restrictions Comments: suprpubic catheter, trying to wean from O2 Restrictions Weight Bearing Restrictions Per Provider Order: No      Mobility  Bed Mobility Overal bed mobility: Modified  Independent                  Transfers Overall transfer level: Needs assistance   Transfers: Sit to/from Stand, Bed to chair/wheelchair/BSC       Squat pivot transfers: Supervision     General transfer comment: reaches for  armrest and able  to partially stand and turn to sit into recliner.    Ambulation/Gait                  Stairs            Wheelchair Mobility     Tilt Bed    Modified Rankin (Stroke Patients Only)       Balance Overall balance assessment: History of Falls, Needs assistance Sitting-balance support: No upper extremity supported, Feet supported Sitting balance-Leahy Scale: Good     Standing balance support: Single extremity supported Standing balance-Leahy Scale: Poor                               Pertinent Vitals/Pain Pain Assessment Pain Assessment: No/denies pain    Home Living Family/patient expects to be discharged to:: Private residence Living Arrangements: Alone Available Help at Discharge: Family;Friend(s) Type of Home: House Home Access: Stairs to enter Entrance Stairs-Rails: Right;Left;Can reach both Entrance Stairs-Number of Steps: 3+1   Home Layout: One level Home Equipment: Wheelchair - Forensic psychologist (2 wheels)      Prior Function               Mobility Comments: walks at rail  when someone is in home w/ him, independnet for trnafers to/from Wc to all surfaces in home ADLs Comments: neice  gets groceries, cleans, has Garment/textile technologist who helps with meds and  transportation, does not use sove/oven     Extremity/Trunk Assessment        Lower Extremity Assessment Lower Extremity Assessment: Generalized weakness (neuropathty)    Cervical / Trunk Assessment Cervical / Trunk Assessment: Kyphotic  Communication   Communication Communication: No apparent difficulties    Cognition Arousal: Alert Behavior During Therapy: WFL for tasks assessed/performed   PT - Cognitive  impairments: No apparent impairments                         Following commands: Intact       Cueing       General Comments      Exercises General Exercises - Lower Extremity Long Arc Quad: AROM, Both, 10 reps, Seated Hip Flexion/Marching: AROM, 10 reps, Seated   Assessment/Plan    PT Assessment Patient needs continued PT services  PT Problem List Decreased strength;Decreased activity tolerance;Decreased knowledge of use of DME;Cardiopulmonary status limiting activity       PT Treatment Interventions DME instruction    PT Goals (Current goals can be found in the Care Plan section)  Acute Rehab PT Goals Patient Stated Goal: go home PT Goal Formulation: With patient Time For Goal Achievement: 02/02/24 Potential to Achieve Goals: Good    Frequency Min 3X/week     Co-evaluation               AM-PAC PT 6 Clicks Mobility  Outcome Measure Help needed turning from your back to your side while in a flat bed without using bedrails?: None Help needed moving from lying on your back to sitting on the side of a flat bed without using bedrails?: None Help needed moving to and from a bed to a chair (including a wheelchair)?: A Little Help needed standing up from a chair using your arms (e.g., wheelchair or bedside chair)?: A Little Help needed to walk in hospital room?: Total Help needed climbing 3-5 steps with a railing? : Total 6 Click Score: 16    End of Session Equipment Utilized During Treatment: Gait belt Activity Tolerance: Patient tolerated treatment well Patient left: in chair;with call bell/phone within reach;with chair alarm set Nurse Communication: Mobility status PT Visit Diagnosis: Unsteadiness on feet (R26.81);Muscle weakness (generalized) (M62.81)    Time: 8986-8954 PT Time Calculation (min) (ACUTE ONLY): 32 min   Charges:   PT Evaluation $PT Eval Low Complexity: 1 Low PT Treatments $Therapeutic Activity: 8-22 mins PT General  Charges $$ ACUTE PT VISIT: 1 Visit         Darice Potters PT Acute Rehabilitation Services Office [phone]   Potters Darice Norris 01/19/2024, 12:22 PM

## 2024-01-19 NOTE — Evaluation (Signed)
 Occupational Therapy Evaluation Patient Details Name: Dustin Duffy MRN: [id number] DOB: [birth year] Today's Date: 01/19/2024   History of Present Illness   Pt is a 68 y.o. male admitted with weakness,  fever, hypoxia, sepsis, pneumonia. L hip pressure injuryPMH: UTI, suprapubic catheter, AKI ,for DM Type II, HTN, legally blind, neuropathy and chronic renal insufficiency.     Clinical Impressions Pt presents with decline in function and safety with ADLs and ADL mobility with impaired balance and endurance; hx of vision impaired. PTA pt lives alone and reports that he was Ind with ADLs/selfcare, home mgt, meal prep and uses a w/c and RW, friends provide transportation. Pt currently requires CGA/Sup for LB ADLs and mobility HHA, directional cues for set up due to visual impairments. OT will follow acutely to maximize level of function and safety     If plan is discharge home, recommend the following:   A little help with bathing/dressing/bathroom;A little help with walking and/or transfers;Assistance with cooking/housework;Assist for transportation;Help with stairs or ramp for entrance     Functional Status Assessment   Patient has had a recent decline in their functional status and demonstrates the ability to make significant improvements in function in a reasonable and predictable amount of time.     Equipment Recommendations   Tub/shower seat     Recommendations for Other Services         Precautions/Restrictions   Precautions Precautions: Fall Precaution/Restrictions Comments: suprpubic catheter, trying to wean from O2 Restrictions Weight Bearing Restrictions Per Provider Order: No     Mobility Bed Mobility               General bed mobility comments: in recliner    Transfers Overall transfer level: Needs assistance Equipment used: 1 person hand held assist Transfers: Sit to/from Stand, Bed to chair/wheelchair/BSC     Squat pivot transfers:  Supervision              Balance Overall balance assessment: History of Falls, Needs assistance Sitting-balance support: No upper extremity supported, Feet supported Sitting balance-Leahy Scale: Good     Standing balance support: Single extremity supported Standing balance-Leahy Scale: Poor                             ADL either performed or assessed with clinical judgement   ADL Overall ADL's : Needs assistance/impaired Eating/Feeding: Set up;Independent;Sitting   Grooming: Wash/dry hands;Wash/dry face;Brushing hair;Contact guard assist;Standing   Upper Body Bathing: Set up   Lower Body Bathing: Contact guard assist   Upper Body Dressing : Set up   Lower Body Dressing: Contact guard assist   Toilet Transfer: Contact guard assist;Supervision/safety;Ambulation   Toileting- Clothing Manipulation and Hygiene: Supervision/safety;Sit to/from stand       Functional mobility during ADLs: Contact guard assist;Supervision/safety;Cueing for safety       Vision Ability to See in Adequate Light: 2 Moderately impaired Patient Visual Report: No change from baseline       Perception         Praxis         Pertinent Vitals/Pain Pain Assessment Pain Assessment: No/denies pain     Extremity/Trunk Assessment Upper Extremity Assessment Upper Extremity Assessment: Generalized weakness   Lower Extremity Assessment Lower Extremity Assessment: LLE deficits/detail   Cervical / Trunk Assessment Cervical / Trunk Assessment: Kyphotic   Communication Communication Communication: No apparent difficulties   Cognition Arousal: Alert Behavior During Therapy: [facility] for tasks assessed/performed  Cueing  General Comments          Exercises     Shoulder Instructions      Home Living Family/patient expects to be discharged to:: Private residence Living Arrangements: Alone Available Help at  Discharge: Family;Friend(s) Type of Home: House Home Access: Stairs to enter [employer] of Steps: 3+1 Entrance Stairs-Rails: Right;Left;Can reach both Home Layout: One level     Bathroom Shower/Tub: Producer, television/film/video: Handicapped height Bathroom Accessibility: Yes   Home Equipment: Wheelchair - Forensic psychologist (2 wheels)   Additional Comments: reported having a Quad cane at home      Prior Functioning/Environment Prior Level of Function : Independent/Modified Independent;History of Falls (last six months)             Mobility Comments: walks at rail when someone is in home w/ him, independnet for tranfers to/from W/c ADLs Comments: neice gets groceries, cleans, has Garment/textile technologist who helps with meds and  transportation, does not use oven    OT Problem List: Decreased strength;Decreased activity tolerance;Impaired balance (sitting and/or standing);Impaired vision/perception;Decreased knowledge of use of DME or AE   OT Treatment/Interventions: Self-care/ADL training;Therapeutic exercise;Patient/family education;Balance training;Therapeutic activities;DME and/or AE instruction      OT Goals(Current goals can be found in the care plan section)   Acute Rehab OT Goals Patient Stated Goal: go home OT Goal Formulation: With patient Time For Goal Achievement: 02/02/24 Potential to Achieve Goals: Good ADL Goals Pt Will Perform Grooming: with supervision;with set-up;standing Pt Will Perform Lower Body Bathing: with supervision;with set-up;sit to/from stand Pt Will Perform Lower Body Dressing: with supervision;with set-up;sit to/from stand Pt Will Transfer to Toilet: with supervision;with modified independence;ambulating Pt Will Perform Toileting - Clothing Manipulation and hygiene: with supervision;with modified independence;sitting/lateral leans;sit to/from stand Pt Will Perform Tub/Shower Transfer: with supervision;ambulating;shower seat;grab bars    OT Frequency:  Min 2X/week    Co-evaluation              AM-PAC OT 6 Clicks Daily Activity     Outcome Measure Help from another person eating meals?: A Little Help from another person taking care of personal grooming?: A Little Help from another person toileting, which includes using toliet, bedpan, or urinal?: A Little Help from another person bathing (including washing, rinsing, drying)?: A Little Help from another person to put on and taking off regular upper body clothing?: A Little Help from another person to put on and taking off regular lower body clothing?: A Little 6 Click Score: 18   End of Session Equipment Utilized During Treatment: Gait belt Nurse Communication: Mobility status  Activity Tolerance: Patient tolerated treatment well Patient left: in chair;with call bell/phone within reach;with nursing/sitter in room  OT Visit Diagnosis: Unsteadiness on feet (R26.81);Muscle weakness (generalized) (M62.81)                Time: 1152-1209 OT Time Calculation (min): 17 min Charges:  OT General Charges $OT Visit: 1 Visit OT Evaluation $OT Eval Low Complexity: 1 Low    Jacques Karna Loose 01/19/2024, 1:50 PM

## 2024-01-20 DIAGNOSIS — F430 Acute stress reaction: Secondary | ICD-10-CM

## 2024-01-20 DIAGNOSIS — R6521 Severe sepsis with septic shock: Secondary | ICD-10-CM

## 2024-01-20 DIAGNOSIS — A419 Sepsis, unspecified organism: Secondary | ICD-10-CM

## 2024-01-20 DIAGNOSIS — F331 Major depressive disorder, recurrent, moderate: Secondary | ICD-10-CM | POA: Diagnosis present

## 2024-01-20 DIAGNOSIS — F419 Anxiety disorder, unspecified: Secondary | ICD-10-CM

## 2024-01-20 LAB — BASIC METABOLIC PANEL WITH GFR
Anion gap: 7 (ref 5–15)
BUN: 29 mg/dL — ABNORMAL HIGH (ref 8–23)
CO2: 27 mmol/L (ref 22–32)
Calcium: 8.4 mg/dL — ABNORMAL LOW (ref 8.9–10.3)
Chloride: 98 mmol/L (ref 98–111)
Creatinine, Ser: 1.32 mg/dL — ABNORMAL HIGH (ref 0.61–1.24)
GFR, Estimated: 58 mL/min — ABNORMAL LOW (ref >60)
Glucose, Bld: 312 mg/dL — ABNORMAL HIGH (ref 70–99)
Potassium: 4.5 mmol/L (ref 3.5–5.1)
Sodium: 132 mmol/L — ABNORMAL LOW (ref 135–145)

## 2024-01-20 LAB — GLUCOSE, CAPILLARY
Glucose-Capillary: 270 mg/dL — ABNORMAL HIGH (ref 70–99)
Glucose-Capillary: 309 mg/dL — ABNORMAL HIGH (ref 70–99)
Glucose-Capillary: 278 mg/dL — ABNORMAL HIGH (ref 70–99)
Glucose-Capillary: 166 mg/dL — ABNORMAL HIGH (ref 70–99)

## 2024-01-20 LAB — CBC WITH DIFFERENTIAL/PLATELET
Abs Immature Granulocytes: 0.08 K/uL — ABNORMAL HIGH (ref 0.00–0.07)
Basophils Absolute: 0 K/uL (ref 0.0–0.1)
Basophils Relative: 0 %
Eosinophils Absolute: 0 K/uL (ref 0.0–0.5)
Eosinophils Relative: 0 %
HCT: 30.1 % — ABNORMAL LOW (ref 39.0–52.0)
Hemoglobin: 9.5 g/dL — ABNORMAL LOW (ref 13.0–17.0)
Immature Granulocytes: 1 %
Lymphocytes Relative: 19 %
Lymphs Abs: 2.1 K/uL (ref 0.7–4.0)
MCH: 26.2 pg (ref 26.0–34.0)
MCHC: 31.6 g/dL (ref 30.0–36.0)
MCV: 82.9 fL (ref 80.0–100.0)
Monocytes Absolute: 0.7 K/uL (ref 0.1–1.0)
Monocytes Relative: 6 %
Neutro Abs: 8.5 K/uL — ABNORMAL HIGH (ref 1.7–7.7)
Neutrophils Relative %: 74 %
Platelets: 191 K/uL (ref 150–400)
RBC: 3.63 MIL/uL — ABNORMAL LOW (ref 4.22–5.81)
RDW: 15.4 % (ref 11.5–15.5)
WBC: 11.4 K/uL — ABNORMAL HIGH (ref 4.0–10.5)
nRBC: 0 % (ref 0.0–0.2)

## 2024-01-20 LAB — URINE CULTURE: Culture: >=100000 — AB

## 2024-01-20 LAB — LACTIC ACID, PLASMA: Lactic Acid, Venous: 0.9 mmol/L (ref 0.5–1.9)

## 2024-01-20 MED ORDER — AMOXICILLIN-POT CLAVULANATE 875-125 MG PO TABS
1.0000 | ORAL_TABLET | Freq: Two times a day (BID) | ORAL | Status: DC
  Filled 2024-01-20: qty 1

## 2024-01-20 MED ORDER — DULOXETINE HCL 20 MG PO CPEP
20.0000 mg | ORAL_CAPSULE | Freq: Every day | ORAL | Status: DC
  Administered 2024-01-20 – 2024-01-21 (×2): 20 mg via ORAL
  Filled 2024-01-20 (×2): qty 1

## 2024-01-20 MED ORDER — INSULIN GLARGINE-YFGN 100 UNIT/ML ~~LOC~~ SOLN
10.0000 [IU] | Freq: Every day | SUBCUTANEOUS | Status: DC
  Administered 2024-01-20 – 2024-01-21 (×2): 10 [IU] via SUBCUTANEOUS
  Filled 2024-01-20 (×2): qty 0.1

## 2024-01-20 MED ORDER — HYDROXYZINE HCL 10 MG PO TABS: 10.0000 mg | ORAL_TABLET | Freq: Three times a day (TID) | ORAL | Status: DC | PRN

## 2024-01-20 NOTE — Progress Notes (Signed)
 Progress Note   Patient: Dustin Duffy FMW:[id number] DOB: 05-19-1955 DOA: 01/18/2024     2 DOS: the patient was seen and examined on 01/20/2024   Brief hospital course: 68yo with h/o chronic urinary retention with suprapubic catheter, DM, chronic hypotension on midodrine , and recurrent urinary yeast infection who presented on 10/23 with generalized weakness.  He was found to have septic shock and started on vasopressors, presumed lung and urinary sources.  He was started on Ceftriaxone , azithromycin, and micafungin.  He was given stress dosed steroids for adrenal insufficiency and weaned off pressors.   He has acute respiratory failure associated with his PNA and remains on supplemental [place] O2.  Assessment and Plan:  Septic shock with acute hypoxic respiratory failure 2/2 uti and pna Presented without fever but with tachycardia, tachypnea, hypoxia, and shock physiology Infectious source appears to be concomitant PNA as well as Klebsiella oxytoca UTI Required transient pressors and stress dosed steroids in the setting of baseline hypotension   Blood cultures NTD Sepsis physiology has improved Will complete Azithromycin today Transition Ceftriaxone  to Augmentin starting tomorrow (10/26) Possible dc as early as tomorrow if he continues to improve 86% on RA on presentation, has been on supplemental O2, will attempt to wean off O2  Chronic hypotension On midodrine , no longer on florinef  Weaned off pressors Treated with stress dosed steroids Resume midodrine  with hold parameters   DM2 A1c 8.9, poor control Hold Januvia, metformin Continue Lantus Cover with moderate-scale SSI Carb modified diet    Stage 3a CKD with chronic urinary retention/suprapubic catheter Creatinine appears to be at/near baseline Avoid nephrotoxic agents, ensure adequate renal perfusion Needs suprapubic catheter exchange if not already done during this hospitalization (ordered) Hold Januvia as inpatient    HLD Continue sinvastatin   Depression/Dementia Continue amitriptyline  and aricept +[place] screening Psychiatry consulted   Peripheral neuropathy Continue gabapentin  at lower dose  Pressure injury Wound 01/18/24 1609 Pressure Injury Hip Left Unstageable - Full thickness tissue loss in which the base of the injury is covered by slough (yellow, tan, gray, green or brown) and/or eschar (tan, brown or black) in the wound bed. (Active)     Wound 01/18/24 1610 Pressure Injury Sacrum Mid;Upper Deep Tissue Pressure Injury - Purple or maroon localized area of discolored intact skin or blood-filled blister due to damage of underlying soft tissue from pressure and/or shear. (Active)    Ambulatory dysfunction Chronic Primarily uses a wheelchair at home, although he is able to get up by himself and walk short distances when needed PT/OT consulted Lives alone but has family support, thinks he will be able to return home  DNR Physician discussed code status with the patient/family on 10/23 and they are in agreement that the patient would not desire resuscitation and would prefer to die a natural death should that situation arise. Patient will need a gold out of facility DNR form at the time of discharge        Consultants: PCCM PT OT TOC team  Procedures: None  Antibiotics: Azithromycin 10/23- Cefepime x 1 Ceftriaxone  10/23- Metronidazole x 1 Vancomycin x 1  30 Day Unplanned Readmission Risk Score    Flowsheet Row ED to Hosp-Admission (Current) from 01/18/2024 in [facility]  30 Day Unplanned Readmission Risk Score (%) 23.05 Filed at 01/20/2024 0800    This score is the patient's risk of an unplanned readmission within 30 days of being discharged (0 -100%). The score is based on dignosis, age, lab data,  medications, orders, and past utilization.   Low:  0-14.9   Medium: 15-21.9   High: 22-29.9   Extreme: 30 and above            Subjective: Feeling better, eager to go home when possible.  Not on home O2.   Objective: Vitals:   01/20/24 0639 01/20/24 1240  BP: (!) 140/87 (!) 160/91  Pulse: 74 81  Resp: 18 20  Temp: 97.7 F (36.5 C) 97.7 F (36.5 C)  SpO2: 94% 98%    Intake/Output Summary (Last 24 hours) at 01/20/2024 1612 Last data filed at 01/20/2024 1552 Gross per 24 hour  Intake 727.35 ml  Output 2200 ml  Net -1472.65 ml   Filed Weights   01/18/24 1550 01/19/24 0500 01/20/24 0520  Weight: 61.2 kg 64.4 kg 66.7 kg    Exam:  General:  Appears calm and comfortable and is in NAD Eyes:  normal lids, iris but he is legally blind ENT:  grossly normal hearing, lips & tongue, mmm Cardiovascular:  RRR. No LE edema.  Respiratory:   CTA bilaterally with no wheezes/rales/rhonchi.  Normal respiratory effort. Abdomen:  soft, NT, ND Skin:  no rash or induration seen on limited exam Musculoskeletal:  baseline generalized weakness, no bony abnormality Psychiatric:  blunted mood and affect, speech fluent and appropriate, AOx3 Neurologic:  CN 2-12 grossly intact, moves all extremities in coordinated fashion  Data Reviewed: I have reviewed the patient's lab results since admission.  Pertinent labs for today include:   Na++ 132, not clinically significant Glucose 312 BUN 29/Creatinine 1.32/GFR 58 WBC 11.4 Hgb 9.5 Blood cultures NTD Urine culture + Klebsiella oxytoca    Family Communication: None present  Mobility: PT/OT Consulted and are recommending - Home Health Pt10/24/2025 1236    Code Status: Limited: Do not attempt resuscitation (DNR) -DNR-LIMITED -Do Not Intubate/DNI     Disposition: Status is: Inpatient Remains inpatient appropriate because: ongoing management     Time spent: 50 minutes  Unresulted Labs (From admission, onward)     Start     Ordered   01/21/24 0500  CBC with Differential/Platelet  Tomorrow morning,   R       Question:  Specimen collection method  Answer:   Lab=Lab collect   01/20/24 1612   01/21/24 0500  Basic metabolic panel with GFR  Tomorrow morning,   R       Question:  Specimen collection method  Answer:  Lab=Lab collect   01/20/24 1612   01/18/24 1515  Legionella Pneumophila Serogp 1 Ur Ag  Once,   R        01/18/24 1515             Author: Delon Herald, MD 01/20/2024 4:12 PM  For on call review [email or web address].

## 2024-01-20 NOTE — Plan of Care (Signed)

## 2024-01-20 NOTE — Plan of Care (Signed)
  Problem: Education: Goal: Ability to describe self-care measures that may prevent or decrease complications (Diabetes Survival Skills Education) will improve Outcome: Progressing   Problem: Fluid Volume: Goal: Ability to maintain a balanced intake and output will improve Outcome: Progressing   Problem: Metabolic: Goal: Ability to maintain appropriate glucose levels will improve Outcome: Progressing   Problem: Nutritional: Goal: Maintenance of adequate nutrition will improve Outcome: Progressing   Problem: Education: Goal: Knowledge of General Education information will improve Description: Including pain rating scale, medication(s)/side effects and non-pharmacologic comfort measures Outcome: Progressing   Problem: Clinical Measurements: Goal: Ability to maintain clinical measurements within normal limits will improve Outcome: Progressing Goal: Diagnostic test results will improve Outcome: Progressing   Problem: Elimination: Goal: Will not experience complications related to urinary retention Outcome: Progressing   Problem: Pain Managment: Goal: General experience of comfort will improve and/or be controlled Outcome: Progressing

## 2024-01-20 NOTE — Consult Note (Signed)
 [facility] Psychiatric Consult Initial  Patient Name: .Dustin Duffy  MRN: [id number]  DOB: [birth year]  Consult Order details:  Orders (From admission, onward)     Start     Ordered   01/20/24 0823  IP CONSULT TO PSYCHIATRY       Ordering Provider: Barbarann Nest, MD  Provider:  (Not yet assigned)  Question Answer Comment  Location [facility]   Reason for Consult? positive Columbia screening test      01/20/24 9177             Mode of Visit: In person    Psychiatry Consult Evaluation  Service Date: January 20, 2024 LOS:  LOS: 2 days  Chief Complaint I had a rough night.   Primary Psychiatric Diagnoses  Anxiety in acute stress reaction  Assessment  Dustin Duffy is a 68 y.o. male admitted: Medicallyfor 01/18/2024 10:32 AM with a history chronic urinary retention w/ urinary obstruction and suprapubic catheter in place, chronic hypotension, diabetes type 2, CKD, who presented in septic shock, consult placed for a positive score on the Columbia.  His current presentation of worry at times with some confusion when he awoke last night and not knowing  is most consistent with anxiety in acute stress reaction.  Current outpatient psychotropic medications include amitriptyline  and historically he has had a positive response to these medications. He was been compliant with medications prior to admission as evidenced by self-report and collateral information. On initial examination, patient was upset about last night when he was confused and did not know where he was and a staff person was rude to him.  Anxiety at times, denied depresssion. Please see plan below for detailed recommendations.   Diagnoses:  Active Hospital problems: Septic shock, anxiety in acute stress reaction    Plan   ## Psychiatric Medication Recommendations:  -Continue amitriptyline  50 mg at bedtime -added hydroxyzine 10 mg TID PRN -started Cymbalta 20 mg daily with his  permission  ## Medical Decision Making Capacity: Not specifically addressed in this encounter  ## Further Work-up:  -- most recent EKG on 01/18/2024 had QtC of 461 -- Pertinent labwork reviewed earlier this admission includes: CBC and diff, u/a, EKG, chem panel  ## Disposition:-- There are no psychiatric contraindications to discharge at this time  ## Behavioral / Environmental: -Delirium Precautions: Delirium Interventions for Nursing and Staff: - RN to open blinds every AM. - To Bedside: Glasses, hearing aide, and pt's own shoes. Make available to patients. when possible and encourage use. - Encourage po fluids when appropriate, keep fluids within reach. - OOB to chair with meals. - Passive ROM exercises to all extremities with AM & PM care. - RN to assess orientation to person, time and place QAM and PRN. - Recommend extended visitation hours with familiar family/friends as feasible. - Staff to minimize disturbances at night. Turn off television when pt asleep or when not in use.   ## Safety and Observation Level:  - Based on my clinical evaluation, I estimate the patient to be at minimal risk of self harm in the current setting. - At this time, we recommend  routine. This decision is based on my review of the chart including patient's history and current presentation, interview of the patient, mental status examination, and consideration of suicide risk including evaluating suicidal ideation, plan, intent, suicidal or self-harm behaviors, risk factors, and protective factors. This judgment is based on our ability to directly address suicide risk, implement suicide prevention  strategies, and develop a safety plan while the patient is in the clinical setting. Please contact our team if there is a concern that risk level has changed.  CSSR Risk Category:C-SSRS RISK CATEGORY: Low Risk  Suicide Risk Assessment: Patient has following modifiable risk factors for suicide: pain, medical illness (ie new  dx of cancer), which we are addressing by warm packs to his right leg along with his scheduled pain medication. Patient has following non-modifiable or demographic risk factors for suicide: male gender and separation or divorce Patient has the following protective factors against suicide: Supportive family, Supportive friends, Frustration tolerance, no history of suicide attempts, and no history of NSSIB  Thank you for this consult request. Recommendations have been communicated to the primary team.  We will sign off at this time.   Sharlot Becker, NP       History of Present Illness  Relevant Aspects of [facility] Course:  Admitted on 01/18/2024 with a history chronic urinary retention w/ urinary obstruction and suprapubic catheter in place, chronic hypotension, diabetes type 2, CKD, who presented in septic shock, consult placed for a positive score on the Columbia.  Patient Report:  The client engaged easily in conversation and was upset that he awakened in the middle of the night last night and did not know where he was.  He accidentally set off the bed alarm and a staff person came into his room and was rather rude to him which upset him.  We discussed this and his rights to complain and he stated he had.  He was able to describe what happened prior to admission when he was not feeling well and his niece called 911.    Denied depression before hospitalization and now.  No suicidal ideations to which he reponded with, Hell no, I don't want to go to hell.  His collateral also stated he was religious with no suicide attempts in the past or psychiatric admission.  Per his chart and his report, no psychiatric issues, past or present.  He scored positive on the Columbia, however, which he did admit he briefly thinks at times it would be easier if was not living, no plans or intent ever.  Dustin Duffy stated he has owned guns since 1979 with no threats to himself or others ever.  He has multiple health  issues from blindness to urinary issues with a permanent catheter.  His niece comes to his home daily to clean, help, shop, and take him places.  His sister and friend of many years, [name], are supportive also.  He does have some worry for awhile and then just forget about it, no panic attacks.  His sleep is ok prior to admission with his right leg pain interfering at times which he has pain medication and amitriptyline  for.  He did state he takes legal gummies at times to help him relax in the evening and for pain, 2%.  No hallucinations, other substance use, paranoia, homicidal ideations, or delusions.  On assessment he was alert and oriented, communicated his needs without issues.  He stated heating pads help his leg pain from an injury in 1993 and this provider alerted a nurse who provided him with some warm packs that he found helpful.    His friend of more than 40 years, [name], was a engineer, civil (consulting) and did feel Cymbalta would benefit him for his chronic pain and anxiety.  Discussed this with the client who was agreeable to start it as he feels he does go  through a lot at times, like this sepsis incident.    Psych ROS:  Depression: denies Anxiety:  at times, stressors Mania (lifetime and current): none Psychosis: (lifetime and current): none  Collateral information:  Contacted sister and friend with his permission, no answers, 01/20/2024, will continue to try.  His friend, [name], called back and has no safety concerns.  She did report he gets frustrated at times and will make a statement that he wishes he was not here in the moment.  Her primary concern was with his falls and worried he will get hurt severely one day.  Review of Systems  Constitutional:  Positive for malaise/fatigue.  HENT: Negative.    Eyes:        Legally blind  Respiratory: Negative.    Cardiovascular: Negative.   Gastrointestinal: Negative.   Genitourinary: Negative.   Musculoskeletal:        Chronic right leg pain   Skin: Negative.   Neurological: Negative.   Endo/Heme/Allergies: Negative.   Psychiatric/Behavioral:  The patient is nervous/anxious.      Psychiatric and Social History  Psychiatric History:  Information collected from patient and chart.  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   Substance History Uses legal gummies for pain and anxiety at times  Exam Findings  Physical Exam: completed by MD, reviewed. Vital Signs:  Temp:  [97.3 F (36.3 C)-99.1 F (37.3 C)] 97.7 F (36.5 C) (10/25 0639) Pulse Rate:  [74-102] 74 (10/25 0639) Resp:  [18-26] 18 (10/25 0639) BP: (119-169)/(64-103) 140/87 (10/25 0639) SpO2:  [92 %-100 %] 94 % (10/25 0639) Weight:  [66.7 kg] 66.7 kg (10/25 0520) Blood pressure (!) 140/87, pulse 74, temperature 97.7 F (36.5 C), temperature source Oral, resp. rate 18, height 5' 8 (1.727 m), weight 66.7 kg, SpO2 94%. Body mass index is 22.36 kg/m.  Physical Exam  Mental Status Exam: General Appearance: Casual  Orientation:  Full (Time, Place, and Person)  Memory:  Immediate;   Good Recent;   Good Remote;   Good  Concentration:  Concentration: Good and Attention Span: Good  Recall:  Good  Attention  Good  Eye Contact:  Good  Speech:  Clear and Coherent  Language:  Good  Volume:  Normal  Mood: anxious at times  Affect:  Non-Congruent  Thought Process:  Coherent  Thought Content:  Logical  Suicidal Thoughts:  No  Homicidal Thoughts:  No  Judgement:  Good  Insight:  Good  Psychomotor Activity:  Decreased  Akathisia:  No  Fund of Knowledge:  Good      Assets:  Housing Leisure Time Resilience Social Support   Cognition:  WNL  ADL's:  Impaired  AIMS (if indicated):        Other History   These have been pulled in through the EMR, reviewed, and updated if appropriate.  Family History:  The patient's family history is not on file.  Medical History: Past Medical History:  Diagnosis Date   BPH without urinary obstruction    Depression    Diabetes mellitus type 2 in nonobese (HCC)    Dyslipidemia    Essential hypertension    Neurogenic bladder    Neuropathy    Pneumonia  Renal impairment    Visual impairment     Surgical History: Past Surgical History:  Procedure Laterality Date   BLADDER INSTILLATION N/A 10/23/2023   Procedure: INSTILLATION, BLADDER;  Surgeon: Carolee Sherwood JONETTA DOUGLAS, MD;  Location: [facility];  Service: Urology;  Laterality: N/A;  BLADDER WASH WITH  AMPHOTERICIN B    CYSTOSCOPY N/A 10/23/2023   Procedure: CYSTOSCOPY;  Surgeon: Carolee Sherwood JONETTA DOUGLAS, MD;  Location: [facility];  Service: Urology;  Laterality: N/A;   IR CYSTOSTOMY TUBE PLACEMENT/BLADDER ASPIRATION  09/13/2023   ORIF HUMERUS FRACTURE Left 06/22/2023   Procedure: OPEN REDUCTION INTERNAL FIXATION (ORIF) PROXIMAL HUMERUS FRACTURE;  Surgeon: Josefina Chew, MD;  Location: [facility];  Service: Orthopedics;  Laterality: Left;     Medications:   Current Facility-Administered Medications:    acetaminophen  (TYLENOL ) tablet 650 mg, 650 mg, Oral, Q4H PRN, Payne, John D, PA-C   amitriptyline  (ELAVIL ) tablet 50 mg, 50 mg, Oral, QHS, Payne, John D, PA-C, 50 mg at 01/19/24 2121   aspirin  EC tablet 81 mg, 81 mg, Oral, q AM, Payne, John D, PA-C, 81 mg at 01/20/24 0859   azithromycin (ZITHROMAX) 500 mg in sodium chloride  0.9 % 250 mL IVPB, 500 mg, Intravenous, Q24H, Payne, John D, PA-C, Stopped at 01/19/24 1801   cefTRIAXone  (ROCEPHIN ) 2 g in sodium chloride  0.9 % 100 mL IVPB, 2 g, Intravenous, Q24H, Payne, John D, PA-C, Stopped at 01/19/24 2015   Chlorhexidine  Gluconate Cloth 2 % PADS 6 each, 6 each, Topical, Daily, Payne, John D, PA-C,  6 each at 01/19/24 1212   collagenase (SANTYL) ointment, , Topical, Daily, Hunsucker, Donnice SAUNDERS, MD, Given at 01/19/24 1224   docusate sodium  (COLACE) capsule 100 mg, 100 mg, Oral, BID PRN, Payne, John D, PA-C   donepezil (ARICEPT) tablet 5 mg, 5 mg, Oral, Daily, Payne, John D, PA-C, 5 mg at 01/20/24 9141   fluconazole  (DIFLUCAN ) tablet 200 mg, 200 mg, Oral, Daily, Payne, John D, PA-C, 200 mg at 01/20/24 9141   fludrocortisone  (FLORINEF ) tablet 0.1 mg, 0.1 mg, Oral, Daily, Payne, John D, PA-C, 0.1 mg at 01/20/24 9141   gabapentin  (NEURONTIN ) capsule 300 mg, 300 mg, Oral, QHS, Paliwal, Aditya, MD, 300 mg at 01/19/24 2121   heparin  injection 5,000 Units, 5,000 Units, Subcutaneous, Q8H, Payne, John D, PA-C, 5,000 Units at 01/20/24 9394   hydrocortisone sodium succinate (SOLU-CORTEF) 100 MG injection 100 mg, 100 mg, Intravenous, Q12H, Payne, John D, PA-C, 100 mg at 01/20/24 9140   insulin  aspart (novoLOG ) injection 0-15 Units, 0-15 Units, Subcutaneous, TID WC, Payne, John D, PA-C, 8 Units at 01/20/24 9140   insulin  aspart (novoLOG ) injection 0-5 Units, 0-5 Units, Subcutaneous, QHS, Payne, John D, PA-C, 2 Units at 01/19/24 2228   midodrine  (PROAMATINE ) tablet 5 mg, 5 mg, Oral, TID WC, Payne, John D, PA-C, 5 mg at 01/20/24 9141   mirabegron  ER (MYRBETRIQ ) tablet 50 mg, 50 mg, Oral, q AM, Payne, John D, PA-C, 50 mg at 01/20/24 9140   ondansetron  (ZOFRAN ) injection 4 mg, 4 mg, Intravenous, Q6H PRN, Payne, John D, PA-C, 4 mg at 01/19/24 1747   oxyCODONE  (Oxy IR/ROXICODONE ) immediate release tablet 5 mg, 5 mg, Oral, Q4H PRN, Paliwal, Aditya, MD, 5 mg at 01/20/24 0010   pantoprazole (PROTONIX) EC tablet 40 mg, 40 mg, Oral, Daily, Payne, John D, PA-C, 40 mg at 01/20/24 9141   polyethylene glycol (MIRALAX  / GLYCOLAX ) packet 17 g, 17 g, Oral, Daily PRN, Payne, John D, PA-C   simvastatin  (ZOCOR ) tablet 20 mg, 20  mg, Oral, QHS, Payne, John D, PA-C, 20 mg at 01/19/24 2121  Allergies: No Known Allergies  Sharlot Becker, NP

## 2024-01-20 NOTE — Hospital Course (Signed)
 68yo with h/o chronic urinary retention with suprapubic catheter, DM, chronic hypotension on midodrine , and recurrent urinary yeast infection who presented on 10/23 with generalized weakness.  He was found to have septic shock and started on vasopressors, presumed lung and urinary sources.  He was started on Ceftriaxone , azithromycin, and micafungin.  He was given stress dosed steroids for adrenal insufficiency and weaned off pressors.   He has acute respiratory failure associated with his PNA and remains on supplemental [place] O2.

## 2024-01-21 ENCOUNTER — Other Ambulatory Visit (HOSPITAL_COMMUNITY): Payer: Self-pay

## 2024-01-21 DIAGNOSIS — N1831 Chronic kidney disease, stage 3a: Secondary | ICD-10-CM | POA: Diagnosis present

## 2024-01-21 DIAGNOSIS — I959 Hypotension, unspecified: Secondary | ICD-10-CM | POA: Diagnosis present

## 2024-01-21 DIAGNOSIS — R6521 Severe sepsis with septic shock: Secondary | ICD-10-CM | POA: Diagnosis not present

## 2024-01-21 DIAGNOSIS — J9621 Acute and chronic respiratory failure with hypoxia: Secondary | ICD-10-CM | POA: Diagnosis present

## 2024-01-21 DIAGNOSIS — A419 Sepsis, unspecified organism: Secondary | ICD-10-CM | POA: Diagnosis not present

## 2024-01-21 LAB — CBC WITH DIFFERENTIAL/PLATELET
Abs Immature Granulocytes: 0.19 K/uL — ABNORMAL HIGH (ref 0.00–0.07)
Basophils Absolute: 0.1 K/uL (ref 0.0–0.1)
Basophils Relative: 1 %
Eosinophils Absolute: 0.1 K/uL (ref 0.0–0.5)
Eosinophils Relative: 1 %
HCT: 31 % — ABNORMAL LOW (ref 39.0–52.0)
Hemoglobin: 9.2 g/dL — ABNORMAL LOW (ref 13.0–17.0)
Immature Granulocytes: 2 %
Lymphocytes Relative: 28 %
Lymphs Abs: 2.5 K/uL (ref 0.7–4.0)
MCH: 25.3 pg — ABNORMAL LOW (ref 26.0–34.0)
MCHC: 29.7 g/dL — ABNORMAL LOW (ref 30.0–36.0)
MCV: 85.4 fL (ref 80.0–100.0)
Monocytes Absolute: 0.6 K/uL (ref 0.1–1.0)
Monocytes Relative: 7 %
Neutro Abs: 5.6 K/uL (ref 1.7–7.7)
Neutrophils Relative %: 61 %
Platelets: 182 K/uL (ref 150–400)
RBC: 3.63 MIL/uL — ABNORMAL LOW (ref 4.22–5.81)
RDW: 15.5 % (ref 11.5–15.5)
WBC: 9.1 K/uL (ref 4.0–10.5)
nRBC: 0 % (ref 0.0–0.2)

## 2024-01-21 LAB — BASIC METABOLIC PANEL WITH GFR
Anion gap: 7 (ref 5–15)
BUN: 22 mg/dL (ref 8–23)
CO2: 28 mmol/L (ref 22–32)
Calcium: 8.3 mg/dL — ABNORMAL LOW (ref 8.9–10.3)
Chloride: 97 mmol/L — ABNORMAL LOW (ref 98–111)
Creatinine, Ser: 1.08 mg/dL (ref 0.61–1.24)
GFR, Estimated: >60 mL/min (ref >60)
Glucose, Bld: 240 mg/dL — ABNORMAL HIGH (ref 70–99)
Potassium: 4.2 mmol/L (ref 3.5–5.1)
Sodium: 132 mmol/L — ABNORMAL LOW (ref 135–145)

## 2024-01-21 LAB — LEGIONELLA PNEUMOPHILA SEROGP 1 UR AG: L. pneumophila Serogp 1 Ur Ag: NEGATIVE

## 2024-01-21 LAB — GLUCOSE, CAPILLARY
Glucose-Capillary: 219 mg/dL — ABNORMAL HIGH (ref 70–99)
Glucose-Capillary: 150 mg/dL — ABNORMAL HIGH (ref 70–99)

## 2024-01-21 MED ORDER — HYDROXYZINE HCL 10 MG PO TABS
10.0000 mg | ORAL_TABLET | Freq: Three times a day (TID) | ORAL | 0 refills | Status: AC | PRN
  Filled 2024-01-21: qty 30, 10d supply, fill #0

## 2024-01-21 MED ORDER — AMOXICILLIN-POT CLAVULANATE 875-125 MG PO TABS
1.0000 | ORAL_TABLET | Freq: Two times a day (BID) | ORAL | 0 refills | Status: AC
  Filled 2024-01-21: qty 8, 4d supply, fill #0

## 2024-01-21 MED ORDER — DULOXETINE HCL 20 MG PO CPEP
20.0000 mg | ORAL_CAPSULE | Freq: Every day | ORAL | 0 refills | Status: AC
  Filled 2024-01-21: qty 30, 30d supply, fill #0

## 2024-01-21 NOTE — Progress Notes (Signed)
 01/20/2024  1358 Received from Pharmacy: Hydroxyzine 10mg  Duloxetine 20mg  Amoxicillin-clavulanate 875-125mg   Meds given to patient

## 2024-01-21 NOTE — Progress Notes (Addendum)
 SATURATION QUALIFICATIONS: (This note is used to comply with regulatory documentation for home oxygen )  Patient Saturations on Room Air at Rest = 88%  Patient Saturations on Room Air while Ambulating = 86%  Patient Saturations on 1 Liters of oxygen  while Resting = 92%  Please briefly explain why patient needs home oxygen :

## 2024-01-21 NOTE — TOC Initial Note (Signed)
 Transition of Care [facility]) - Initial/Assessment Note    Patient Details  Name: Dustin Duffy MRN: [id number] Date of Birth: 11/30/1955  Transition of Care [facility]) CM/SW Contact:    Sonda Manuella Quill, RN Phone Number: 01/21/2024, 1:38 PM  Clinical Narrative:                 IP CM for d/c planning; orders received for [facility] services, and home oxygen ; spoke w/ pt in room; pt said he lives at home; he plans to return at d/c; pt identified POC sister Dustin Duffy ([phone]); family will provide transportation; verified insurance/PCP; he denied SDOH risks; pt has HHPT/OT/RN/Aide and SW w/ [facility]; pt said he would like to con't services w/ agency; he agreed to receive home oxygen ; requested his sister Dustin Duffy be contacted for name of agency for oxygen ; he said if she does not remember, he does not have agency preference; spoke w/ his sister and she cannot remember agency's name; referral given to Jermaine at [place]; he said agency can provide service, and travel tank will be delivered to pt's room; pt notified; contact info agencies placed in follow up provider section of d/c instructions; no IP CM needs.  Expected Discharge Plan: Home w Home Health Services Barriers to Discharge: No Barriers Identified   Patient Goals and CMS Choice Patient states their goals for this hospitalization and ongoing recovery are:: home CMS Medicare.gov Compare Post Acute Care list provided to:: Patient Choice offered to / list presented to : Patient      Expected Discharge Plan and Services   Discharge Planning Services: CM Consult Post Acute Care Choice: Durable Medical Equipment, Home Health Living arrangements for the past 2 months: Single Family Home Expected Discharge Date: 01/21/24               DME Arranged: Oxygen  DME Agency: [employer] Date DME Agency Contacted: 01/21/24 Time DME Agency Contacted: [phone] Representative spoke with at DME Agency: [place] HH Arranged: RN, PT, OT,  Nurse's Aide, Social Work [facility] Agency: [facility] ([facility]) Date HH Agency Contacted: 01/21/24 Time HH Agency Contacted: [phone] Representative spoke with at [facility] Agency: [place]  Prior Living Arrangements/Services Living arrangements for the past 2 months: Single Family Home Lives with:: Self Patient language and need for interpreter reviewed:: Yes Do you feel safe going back to the place where you live?: Yes      Need for Family Participation in Patient Care: Yes (Comment) Care giver support system in place?: Yes (comment) Current home services: DME, Homehealth aide, Home OT, Home PT, Home RN, Other (comment) (walker, wheelchair; HHPT/OT/Aide/RN, SW w/ [facility]) Criminal Activity/Legal Involvement Pertinent to Current Situation/Hospitalization: No - Comment as needed  Activities of Daily Living   ADL Screening (condition at time of admission) Independently performs ADLs?: Yes (appropriate for developmental age) Is the patient deaf or have difficulty hearing?: No Does the patient have difficulty seeing, even when wearing glasses/contacts?: Yes Does the patient have difficulty concentrating, remembering, or making decisions?: No  Permission Sought/Granted Permission sought to share information with : Case Manager Permission granted to share information with : Yes, Verbal Permission Granted  Share Information with NAME: Case Manager     Permission granted to share info w Relationship: Dustin Duffy (sister) [phone]     Emotional Assessment Appearance:: Appears stated age Attitude/Demeanor/Rapport: Gracious Affect (typically observed): Accepting Orientation: : Oriented to Self, Oriented to Place, Oriented to  Time, Oriented to Situation Alcohol / Substance Use: Illicit Drugs Psych Involvement: No (  comment)  Admission diagnosis:  Septic shock (HCC) [A41.9, R65.21] Sepsis, due to unspecified organism, unspecified whether acute organ dysfunction present [facility])  [A41.9] Patient Active Problem List   Diagnosis Date Noted   Acute on chronic respiratory failure with hypoxia (HCC) 01/21/2024   Hypotension 01/21/2024   Chronic kidney disease, stage 3a (HCC) 01/21/2024   Anxiety in acute stress reaction 01/20/2024   Septic shock (HCC) 01/18/2024   UTI (urinary tract infection) 06/21/2023   Acute UTI 06/20/2023   Sepsis secondary to UTI (HCC) 06/20/2023   Complicated UTI (urinary tract infection) 04/14/2023   Falls frequently 06/06/2022   Acute kidney injury 04/29/2022   BPH with urinary obstruction 04/29/2022   Suprapubic catheter (HCC) 04/29/2022   Frequent falls 04/29/2022   Multiple fractures of ribs, left side, initial encounter for closed fracture 04/29/2022   Sacral fracture, closed (HCC) 04/29/2022   Closed wedge compression fracture of T12 vertebra (HCC) 04/29/2022   Diabetes mellitus type 2 in nonobese (HCC) 02/09/2021   Dyslipidemia 02/09/2021   Essential hypertension 02/09/2021   Renal impairment 02/09/2021   Non-traumatic rhabdomyolysis 02/09/2021   Visual impairment 02/09/2021   PCP:  Gerome Brunet, DO Pharmacy:   [facility] [phone] [address],  - [address] AT [facility] [address] [place] [address] Phone: [phone] Fax: [phone]  [place] [facility] [address] [place] [address] Phone: [phone] Fax: [phone]     Social Drivers of Health (SDOH) Social History: SDOH Screenings   Food Insecurity: No Food Insecurity (01/21/2024)  Housing: Low Risk  (01/21/2024)  Transportation Needs: No Transportation Needs (01/21/2024)  Utilities: Not At Risk (01/21/2024)  Recent Concern: Utilities - At Risk (12/18/2023)  Depression (PHQ2-9): Low Risk  (01/15/2024)  Social Connections: Socially Isolated (01/18/2024)  Tobacco Use: Medium Risk (01/18/2024)   SDOH Interventions: Food Insecurity Interventions: Intervention Not Indicated, Inpatient  TOC Housing Interventions: Intervention Not Indicated, Inpatient TOC Transportation Interventions: Intervention Not Indicated, Inpatient TOC Utilities Interventions: Intervention Not Indicated, Inpatient TOC   Readmission Risk Interventions    01/21/2024    1:28 PM 05/02/2022   10:09 AM  Readmission Risk Prevention Plan  Transportation Screening Complete Complete  PCP or Specialist Appt within 5-7 Days Complete Complete  Home Care Screening Complete Complete  Medication Review (RN CM) Complete Complete

## 2024-01-21 NOTE — Plan of Care (Signed)
  Problem: Education: Goal: Ability to describe self-care measures that may prevent or decrease complications (Diabetes Survival Skills Education) will improve Outcome: Progressing   Problem: Coping: Goal: Ability to adjust to condition or change in health will improve Outcome: Progressing   Problem: Fluid Volume: Goal: Ability to maintain a balanced intake and output will improve Outcome: Progressing   Problem: Metabolic: Goal: Ability to maintain appropriate glucose levels will improve Outcome: Progressing   Problem: Nutritional: Goal: Maintenance of adequate nutrition will improve Outcome: Progressing   Problem: Education: Goal: Knowledge of General Education information will improve Description: Including pain rating scale, medication(s)/side effects and non-pharmacologic comfort measures Outcome: Progressing   Problem: Clinical Measurements: Goal: Ability to maintain clinical measurements within normal limits will improve Outcome: Progressing

## 2024-01-21 NOTE — Progress Notes (Signed)
 Physical Therapy Treatment Patient Details Name: Dustin Duffy MRN: [id number] DOB: 1955/09/18 Today's Date: 01/21/2024   History of Present Illness Pt is a 68 y.o. male admitted with weakness,  fever, hypoxia, sepsis, pneumonia. L hip pressure injuryPMH: UTI, suprapubic catheter, AKI ,for DM Type II, HTN, legally blind, neuropathy and chronic renal insufficiency.    PT Comments  Pt tolerated session well today. Very pleasant and eagar to get better and get home ( he is hoping home with no O2)  . Upon entering pt was on 1 liter in bed at rest at 94%. Removed O2 for session to see how pt tolerated RA. Pt states that during the day he uses his WC  mostly , but stands in kitchen and at sink to do things, but always has WC behind him in case he needs to sit or losses his balance backwards.   When folks visit , he may walk in his hallways using rails and 2nd person follows behind him with WC.  Pt is active with HHPT and I recommend he continue with HHPT .   Walked with RW today with WC followed behind him with CGA. Tolerated well, on RA and when returned pt was t 86% O2 sats, but recovered to 95% after 1 minute of pursed lip breathing. Educated pt to continue with these exercises ever hour.      If plan is discharge home, recommend the following: A little help with walking and/or transfers;Assist for transportation;Help with stairs or ramp for entrance   Can travel by private vehicle        Equipment Recommendations  None recommended by PT    Recommendations for Other Services       Precautions / Restrictions Precautions Precautions: Fall Precaution/Restrictions Comments: suprpubic catheter, trying to wean from O2 Restrictions Weight Bearing Restrictions Per Provider Order: No     Mobility  Bed Mobility                    Transfers Overall transfer level: Needs assistance   Transfers: Sit to/from Stand, Bed to chair/wheelchair/BSC (squat pivot only with supervision from  bed to WC and WC to bed.) Sit to Stand: Supervision     Squat pivot transfers: Supervision     General transfer comment: reaches for  armrest and able  to partially stand and turn to sit into small WC ( like at home)    Ambulation/Gait Ambulation/Gait assistance: Supervision Gait Distance (Feet): 100 Feet Assistive device: Rolling walker (2 wheels) Gait Pattern/deviations: Step-through pattern       General Gait Details: tolerated waking well. some weakness noted with increased distance , pt states his legs get weka after a while.   Stairs             [employer] mobility: Yes Wheelchair propulsion: Right upper extremity, Both upper extremities Distance: propelled himslef in room and into the hallway with B LEs and UEs. knows all safety wtih locking breakes etc.   Tilt Bed    Modified Rankin (Stroke Patients Only)       Balance Overall balance assessment: History of Falls, Needs assistance Sitting-balance support: No upper extremity supported, Feet supported Sitting balance-Leahy Scale: Good     Standing balance support: During functional activity, Bilateral upper extremity supported Standing balance-Leahy Scale: Good  Communication Communication Communication: No apparent difficulties Factors Affecting Communication: Hearing impaired  Cognition Arousal: Alert Behavior During Therapy: WFL for tasks assessed/performed   PT - Cognitive impairments: No apparent impairments                                Cueing    Exercises Other Exercises Other Exercises: pt showed PT what exercises he does at home: marching in place seated, hip ADD squeezing a pillow between his legs, knee extesion ( Long arc quad) Other Exercises: PT educated pt on pursed lip breathing exercises 5 times especially after moving or walking and when sitting and lying stilll as well. For 0 2  recovery    General Comments        Pertinent Vitals/Pain Pain Assessment Pain Assessment: No/denies pain    Home Living                          Prior Function            PT Goals (current goals can now be found in the care plan section) Acute Rehab PT Goals Patient Stated Goal: go home PT Goal Formulation: With patient Time For Goal Achievement: 02/02/24 Potential to Achieve Goals: Good Progress towards PT goals: Progressing toward goals    Frequency    Min 3X/week      PT Plan      Co-evaluation              AM-PAC PT 6 Clicks Mobility   Outcome Measure  Help needed turning from your back to your side while in a flat bed without using bedrails?: None Help needed moving from lying on your back to sitting on the side of a flat bed without using bedrails?: None Help needed moving to and from a bed to a chair (including a wheelchair)?: A Little Help needed standing up from a chair using your arms (e.g., wheelchair or bedside chair)?: A Little Help needed to walk in hospital room?: A Little Help needed climbing 3-5 steps with a railing? : A Little 6 Click Score: 20    End of Session Equipment Utilized During Treatment: Gait belt Activity Tolerance: Patient tolerated treatment well Patient left: in bed;with call bell/phone within reach Nurse Communication: Mobility status PT Visit Diagnosis: Unsteadiness on feet (R26.81);Muscle weakness (generalized) (M62.81)     Time: 8869-8794 PT Time Calculation (min) (ACUTE ONLY): 35 min  Charges:    $Gait Training: 23-37 mins PT General Charges $$ ACUTE PT VISIT: 1 Visit                     Bostyn Bogie, PT, MPT Acute Rehabilitation Services Office: [phone] If a weekend: secure chat groups: [facility] PT, [facility] OT, [facility] SLP 01/21/2024    Charlayne Vultaggio 01/21/2024, 12:59 PM

## 2024-01-21 NOTE — Discharge Summary (Signed)
 Physician Discharge Summary   Patient: Dustin Duffy MRN: [id number] DOB: [birth year]  Admit date:     01/18/2024  Discharge date: 01/21/24  Discharge Physician: Delon Herald   PCP: Gerome Brunet, DO   Recommendations at discharge:   Complete antibiotics - Augmentin for 4 more days You are being discharged with home physical therapy Home oxygen  is recommended and has been ordered Cymbalta ordered with hydroxyzine as needed for anxiety, as per psychiatry Follow up with Dr. Gerome in 1-2 weeks  Discharge Diagnoses: Principal Problem:   Septic shock (HCC) Active Problems:   Diabetes mellitus type 2 in nonobese (HCC)   Dyslipidemia   Visual impairment   Suprapubic catheter (HCC)   Sepsis secondary to UTI (HCC)   Anxiety in acute stress reaction   Acute on chronic respiratory failure with hypoxia (HCC)   Hypotension   Chronic kidney disease, stage 3a [facility])    Hospital Course: 68yo with h/o chronic urinary retention with suprapubic catheter, DM, chronic hypotension on midodrine , and recurrent urinary yeast infection who presented on 10/23 with generalized weakness.  He was found to have septic shock and started on vasopressors, presumed lung and urinary sources.  He was started on Ceftriaxone , azithromycin, and micafungin.  He was given stress dosed steroids for adrenal insufficiency and weaned off pressors.   He has acute respiratory failure associated with his PNA and remains on supplemental [place] O2.  Assessment and Plan:  Septic shock with acute on chronic hypoxic respiratory failure 2/2 uti and pna Presented without fever but with tachycardia, tachypnea, hypoxia, and shock physiology Infectious source appears to be concomitant PNA as well as Klebsiella oxytoca UTI Required transient pressors and stress dosed steroids in the setting of baseline hypotension   Blood cultures NTD Sepsis physiology has improved Will complete Azithromycin today Transition Ceftriaxone  to Augmentin  starting tomorrow (10/26) 86% on RA on presentation, has been on supplemental O2, will attempt to wean off O2; he reports being previously discharged with O2 but he had the company come and get it after a week or two; he again qualifies and this will be ordered but he may decide to decline   Chronic hypotension On midodrine , no longer on florinef  Weaned off pressors Treated with stress dosed steroids Resume midodrine  with hold parameters   DM2 A1c 8.9, poor control Resume Januvia, metformin Continue Lantus Cover with moderate-scale SSI Carb modified diet    Stage 3a CKD with chronic urinary retention/suprapubic catheter Creatinine appears to be at/near baseline Avoid nephrotoxic agents, ensure adequate renal perfusion Needs suprapubic catheter exchange prior to dc Resume Januvia   HLD Continue sinvastatin   Depression/Dementia Continue amitriptyline  and aricept +[place] screening Psychiatry consulted and cleared patient Cymbalta added with prn hydroxyzine for anxiety   Peripheral neuropathy Continue gabapentin  at lower dose   Pressure injury, POA Wound 01/18/24 1609 Pressure Injury Hip Left Unstageable - Full thickness tissue loss in which the base of the injury is covered by slough (yellow, tan, gray, green or brown) and/or eschar (tan, brown or black) in the wound bed. (Active)     Wound 01/18/24 1610 Pressure Injury Sacrum Mid;Upper Deep Tissue Pressure Injury - Purple or maroon localized area of discolored intact skin or blood-filled blister due to damage of underlying soft tissue from pressure and/or shear. (Active)    Ambulatory dysfunction Chronic Primarily uses a wheelchair at home, although he is able to get up by himself and walk short distances when needed PT/OT consulted Lives alone but has family support,  thinks he will be able to return home PT consulted   DNR Physician discussed code status with the patient/family on 10/23 and they are in agreement that  the patient would not desire resuscitation and would prefer to die a natural death should that situation arise.          Consultants: PCCM PT OT TOC team   Procedures: None   Antibiotics: Azithromycin 10/23- Cefepime x 1 Ceftriaxone  10/23- Metronidazole x 1 Vancomycin x 1  Pain control - [place]  Controlled Substance Reporting System database was reviewed. and patient was instructed, not to drive, operate heavy machinery, perform activities at heights, swimming or participation in water  activities or provide baby-sitting services while on Pain, Sleep and Anxiety Medications; until their outpatient Physician has advised to do so again. Also recommended to not to take more than prescribed Pain, Sleep and Anxiety Medications.   Disposition: Home Diet recommendation:  Carb modified diet DISCHARGE MEDICATION: Allergies as of 01/21/2024   No Known Allergies      Medication List     STOP taking these medications    fludrocortisone  0.1 MG tablet Commonly known as: FLORINEF        TAKE these medications    amitriptyline  50 MG tablet Commonly known as: ELAVIL  Take 50 mg by mouth at bedtime.   amoxicillin-clavulanate 875-125 MG tablet Commonly known as: AUGMENTIN Take 1 tablet by mouth every 12 (twelve) hours for 4 days.   aspirin  EC 81 MG tablet Take 81 mg by mouth in the morning. Swallow whole.   cholecalciferol 25 MCG (1000 UNIT) tablet Commonly known as: VITAMIN D3 Take 1,000 Units by mouth every Monday, Wednesday, and Friday.   donepezil 5 MG tablet Commonly known as: ARICEPT Take 5 mg by mouth daily.   DULoxetine 20 MG capsule Commonly known as: CYMBALTA Take 1 capsule (20 mg total) by mouth daily. Start taking on: January 22, 2024   gabapentin  300 MG capsule Commonly known as: NEURONTIN  Take 300-900 mg by mouth See admin instructions. Take 300 mg by mouth in the morning and at noon. Take 900 mg by mouth at bedtime.   hydrOXYzine 10 MG  tablet Commonly known as: ATARAX Take 1 tablet (10 mg total) by mouth 3 (three) times daily as needed for anxiety.   ipratropium 0.03 % nasal spray Commonly known as: ATROVENT  Place 2 sprays into both nostrils 2 (two) times daily as needed for rhinitis.   Januvia 50 MG tablet Generic drug: sitaGLIPtin Take 50 mg by mouth in the morning.   Lantus 100 UNIT/ML injection Generic drug: insulin  glargine Inject 12 Units into the skin in the morning. What changed: how much to take   metFORMIN 500 MG 24 hr tablet Commonly known as: GLUCOPHAGE-XR Take 500 mg by mouth daily.   midodrine  5 MG tablet Commonly known as: PROAMATINE  Take 1 tablet (5 mg total) by mouth 3 (three) times daily with meals. What changed:  when to take this reasons to take this   Myrbetriq  50 MG Tb24 tablet Generic drug: mirabegron  ER Take 50 mg by mouth in the morning.   oxyCODONE  5 MG immediate release tablet Commonly known as: Roxicodone  Take 1 tablet (5 mg total) by mouth every 4 (four) hours as needed for severe pain (pain score 7-10). What changed: when to take this   sennosides-docusate sodium  8.6-50 MG tablet Commonly known as: SENOKOT-S Take 1 tablet by mouth daily.   simvastatin  20 MG tablet Commonly known as: ZOCOR  Take 1 tablet (20 mg total)  by mouth every evening. Do not take this medication until you finish Diflucan . What changed:  when to take this additional instructions               Durable Medical Equipment  (From admission, onward)           Start     Ordered   01/21/24 1248  DME Oxygen   Once       Question Answer Comment  Length of Need Lifetime   Mode or (Route) Nasal cannula   Liters per Minute 2   Frequency Continuous (stationary and portable oxygen  unit needed)   Oxygen  conserving device Yes   Oxygen  delivery system: Gas      01/21/24 1249              Discharge Care Instructions  (From admission, onward)           Start     Ordered    01/21/24 0000  Discharge wound care:       Comments: Cleanse L hip wound with Vashe, do not rinse and allow to air dry. Apply 1/4 thick layer of Santyl to wound bed, top with saline moist gauze, dry gauze and silicone foam or ABD pad and tape whichever is preferred.   Cleanse sacrum with soap and water , apply Xeroform gauze ([id number] [id number]) to purple maroon discoloration daily and secure with silicone foam.   01/21/24 1249            Follow-up Information     Gerome Brunet, DO Follow up in 1 week(s).   Specialty: Family Medicine Contact information: [address] [place] [address] [place] [address] [phone]                Discharge Exam:    Subjective: Feeling ok, wants to go home today.  Agrees with suprapubic cath exchange prior to dc.  Does not want home O2 but may be willing to consider this.   Objective: Vitals:   01/21/24 1228 01/21/24 1249  BP: (!) 140/83   Pulse: 78   Resp: 20   Temp: 98.3 F (36.8 C)   SpO2: 94% 94%    Intake/Output Summary (Last 24 hours) at 01/21/2024 1252 Last data filed at 01/21/2024 0900 Gross per 24 hour  Intake 480 ml  Output 1900 ml  Net -1420 ml   Filed Weights   01/19/24 0500 01/20/24 0520 01/21/24 0600  Weight: 64.4 kg 66.7 kg 66.4 kg    Exam:  General:  Appears calm and comfortable and is in NAD, frail Eyes:  normal lids, iris but he is legally blind ENT:  grossly normal hearing, lips & tongue, mmm Cardiovascular:  RRR. No LE edema.  Respiratory:   CTA bilaterally with no wheezes/rales/rhonchi.  Normal respiratory effort. Abdomen:  soft, NT, ND; + suprapubic catheter in place Skin:  no rash or induration seen on limited exam Musculoskeletal:  baseline generalized weakness, no bony abnormality Psychiatric:  blunted mood and affect, speech fluent and appropriate, AOx3 Neurologic:  CN 2-12 grossly intact, moves all extremities in coordinated fashion  Data Reviewed: I have reviewed the patient's lab results  since admission.  Pertinent labs for today include:  Na++ 132, stable Glucose 240 WBC 9.1 Hgb 9.2 RVP negative Blood cultures NTD    Condition at discharge: improving  The results of significant diagnostics from this hospitalization (including imaging, microbiology, ancillary and laboratory) are listed below for reference.   Imaging Studies: US  RENAL Result Date: 01/18/2024 CLINICAL DATA:  Acute renal insufficiency. EXAM: RENAL / URINARY TRACT ULTRASOUND COMPLETE COMPARISON:  Ultrasound dated 04/29/2022. FINDINGS: Right Kidney: Renal measurements: 10.2 x 4.7 x 3.8 cm = volume: 92 mL. Mild increased echogenicity. No hydronephrosis or shadowing stone. Left Kidney: Renal measurements: 9.7 x 5.1 x 5.3 cm = volume: 136 mL. Mild increased echogenicity. No hydronephrosis or shadowing stone. Bladder: Appears normal for degree of bladder distention. A suprapubic catheter with balloon in the bladder noted. Other: None. IMPRESSION: Mildly echogenic kidneys may represent medical renal disease. No hydronephrosis or shadowing stone. Electronically Signed   By: Vanetta Chou M.D.   On: 01/18/2024 20:56   DG Chest Port 1 View Result Date: 01/18/2024 CLINICAL DATA:  Questionable sepsis. EXAM: PORTABLE CHEST 1 VIEW COMPARISON:  06/25/2023, 06/06/2022 FINDINGS: Lungs are adequately inflated. Mild opacification over the medial right base which may be due to vascular crowding although could not exclude atelectasis or infection. Remainder the lungs are clear. Cardiomediastinal silhouette and remainder of the exam is unchanged. IMPRESSION: Mild opacification over the medial right base which may be due to vascular crowding, although atelectasis or infection is possible. Consider PA and lateral chest x-ray for better evaluation. Electronically Signed   By: Toribio Agreste M.D.   On: 01/18/2024 11:15    Microbiology: Results for orders placed or performed during the hospital encounter of 01/18/24  Blood Culture  (routine x 2)     Status: None (Preliminary result)   Collection Time: 01/18/24 10:43 AM   Specimen: BLOOD  Result Value Ref Range Status   Specimen Description   Final    BLOOD RIGHT ANTECUBITAL Performed at [facility], [address], [place], [place] [address]    Special Requests   Final    BOTTLES DRAWN AEROBIC AND ANAEROBIC Blood Culture adequate volume Performed at [facility], [address] [place], [place] [address]    Culture   Final    NO GROWTH 3 DAYS Performed at [facility], [address], [place], [place] [address]    Report Status PENDING  Incomplete  Blood Culture (routine x 2)     Status: None (Preliminary result)   Collection Time: 01/18/24 10:48 AM   Specimen: BLOOD  Result Value Ref Range Status   Specimen Description   Final    BLOOD SITE NOT SPECIFIED Performed at [facility], [address], [place], [place] [address]    Special Requests   Final    BOTTLES DRAWN AEROBIC AND ANAEROBIC Blood Culture adequate volume Performed at [facility], [address] [place], [place] [address]    Culture   Final    NO GROWTH 3 DAYS Performed at [facility], [address] [place], [place] [address]    Report Status PENDING  Incomplete  Urine Culture     Status: Abnormal   Collection Time: 01/18/24 10:48 AM   Specimen: Urine, Random  Result Value Ref Range Status   Specimen Description   Final    URINE, RANDOM Performed at [facility], [address], [place], [place] [address]    Special Requests   Final    NONE Reflexed from [id number] Performed at [facility], [address] [place], [place] [address]    Culture >=100,000 COLONIES/mL KLEBSIELLA OXYTOCA (A)  Final   Report Status 01/20/2024 FINAL  Final   Organism ID, Bacteria KLEBSIELLA OXYTOCA (A)  Final      Susceptibility  Klebsiella oxytoca - MIC*    AMPICILLIN RESISTANT Resistant      CEFEPIME <=0.12 SENSITIVE Sensitive     ERTAPENEM <=0.12 SENSITIVE Sensitive     CEFTRIAXONE  <=0.25 SENSITIVE Sensitive     CIPROFLOXACIN <=0.06 SENSITIVE Sensitive     GENTAMICIN <=1 SENSITIVE Sensitive     NITROFURANTOIN <=16 SENSITIVE Sensitive     TRIMETH/SULFA >=320 RESISTANT Resistant     AMPICILLIN/SULBACTAM 4 SENSITIVE Sensitive     PIP/TAZO Value in next row Sensitive      <=4 SENSITIVEThis is a modified FDA-approved test that has been validated and its performance characteristics determined by the reporting laboratory.  This laboratory is certified under the Clinical Laboratory Improvement Amendments CLIA as qualified to perform high complexity clinical laboratory testing.    MEROPENEM Value in next row Sensitive      <=4 SENSITIVEThis is a modified FDA-approved test that has been validated and its performance characteristics determined by the reporting laboratory.  This laboratory is certified under the Clinical Laboratory Improvement Amendments CLIA as qualified to perform high complexity clinical laboratory testing.    * >=100,000 COLONIES/mL KLEBSIELLA OXYTOCA  Resp panel by RT-PCR (RSV, Flu A&B, Covid) Anterior Nasal Swab     Status: None   Collection Time: 01/18/24 11:54 AM   Specimen: Anterior Nasal Swab  Result Value Ref Range Status   SARS Coronavirus 2 by RT PCR NEGATIVE NEGATIVE Final    Comment: (NOTE) SARS-CoV-2 target nucleic acids are NOT DETECTED.  The SARS-CoV-2 RNA is generally detectable in upper respiratory specimens during the acute phase of infection. The lowest concentration of SARS-CoV-2 viral copies this assay can detect is 138 copies/mL. A negative result does not preclude SARS-Cov-2 infection and should not be used as the sole basis for treatment or other patient management decisions. A negative result may occur with  improper specimen collection/handling, submission of specimen other than nasopharyngeal swab, presence of viral mutation(s) within  the areas targeted by this assay, and inadequate number of viral copies(<138 copies/mL). A negative result must be combined with clinical observations, patient history, and epidemiological information. The expected result is Negative.  Fact Sheet for Patients:  bloggercourse.com  Fact Sheet for Healthcare Providers:  seriousbroker.it  This test is no t yet approved or cleared by the [place]  FDA and  has been authorized for detection and/or diagnosis of SARS-CoV-2 by FDA under an Emergency Use Authorization (EUA). This EUA will remain  in effect (meaning this test can be used) for the duration of the COVID-19 declaration under Section 564(b)(1) of the Act, 21 U.S.C.section 360bbb-3(b)(1), unless the authorization is terminated  or revoked sooner.       Influenza A by PCR NEGATIVE NEGATIVE Final   Influenza B by PCR NEGATIVE NEGATIVE Final    Comment: (NOTE) The Xpert Xpress SARS-CoV-2/FLU/RSV plus assay is intended as an aid in the diagnosis of influenza from Nasopharyngeal swab specimens and should not be used as a sole basis for treatment. Nasal washings and aspirates are unacceptable for Xpert Xpress SARS-CoV-2/FLU/RSV testing.  Fact Sheet for Patients: bloggercourse.com  Fact Sheet for Healthcare Providers: seriousbroker.it  This test is not yet approved or cleared by the [place]  FDA and has been authorized for detection and/or diagnosis of SARS-CoV-2 by FDA under an Emergency Use Authorization (EUA). This EUA will remain in effect (meaning this test can be used) for the duration of the COVID-19 declaration under Section 564(b)(1) of the Act, 21 U.S.C. section 360bbb-3(b)(1), unless the authorization is terminated or  revoked.     Resp Syncytial Virus by PCR NEGATIVE NEGATIVE Final    Comment: (NOTE) Fact Sheet for  Patients: bloggercourse.com  Fact Sheet for Healthcare Providers: seriousbroker.it  This test is not yet approved or cleared by the [place]  FDA and has been authorized for detection and/or diagnosis of SARS-CoV-2 by FDA under an Emergency Use Authorization (EUA). This EUA will remain in effect (meaning this test can be used) for the duration of the COVID-19 declaration under Section 564(b)(1) of the Act, 21 U.S.C. section 360bbb-3(b)(1), unless the authorization is terminated or revoked.  Performed at [facility], [address] [place], [place] [address]   MRSA Next Gen by PCR, Nasal     Status: Abnormal   Collection Time: 01/18/24  4:31 PM   Specimen: Nasal Mucosa; Nasal Swab  Result Value Ref Range Status   MRSA by PCR Next Gen DETECTED (A) NOT DETECTED Final    Comment: (NOTE) The GeneXpert MRSA Assay (FDA approved for NASAL specimens only), is one component of a comprehensive MRSA colonization surveillance program. It is not intended to diagnose MRSA infection nor to guide or monitor treatment for MRSA infections. Test performance is not FDA approved in patients less than 9 years old. Performed at [facility], [address], [place], [place] [address]   Respiratory (~20 pathogens) panel by PCR     Status: None   Collection Time: 01/18/24  5:53 PM   Specimen: Urine, Catheterized; Respiratory  Result Value Ref Range Status   Adenovirus NOT DETECTED NOT DETECTED Final   Coronavirus 229E NOT DETECTED NOT DETECTED Final    Comment: (NOTE) The Coronavirus on the Respiratory Panel, DOES NOT test for the novel  Coronavirus (2019 nCoV)    Coronavirus HKU1 NOT DETECTED NOT DETECTED Final   Coronavirus NL63 NOT DETECTED NOT DETECTED Final   Coronavirus OC43 NOT DETECTED NOT DETECTED Final   Metapneumovirus NOT DETECTED NOT DETECTED Final   Rhinovirus / Enterovirus NOT DETECTED NOT  DETECTED Final   Influenza A NOT DETECTED NOT DETECTED Final   Influenza B NOT DETECTED NOT DETECTED Final   Parainfluenza Virus 1 NOT DETECTED NOT DETECTED Final   Parainfluenza Virus 2 NOT DETECTED NOT DETECTED Final   Parainfluenza Virus 3 NOT DETECTED NOT DETECTED Final   Parainfluenza Virus 4 NOT DETECTED NOT DETECTED Final   Respiratory Syncytial Virus NOT DETECTED NOT DETECTED Final   Bordetella pertussis NOT DETECTED NOT DETECTED Final   Bordetella Parapertussis NOT DETECTED NOT DETECTED Final   Chlamydophila pneumoniae NOT DETECTED NOT DETECTED Final   Mycoplasma pneumoniae NOT DETECTED NOT DETECTED Final    Comment: Performed at [facility], [address] [place], [place] [address]    Labs: CBC: Recent Labs  Lab 01/18/24 1043 01/18/24 1503 01/19/24 0302 01/20/24 0924 01/21/24 0604  WBC 9.2 16.8* 15.0* 11.4* 9.1  NEUTROABS 8.1*  --   --  8.5* 5.6  HGB 13.6 10.0* 10.3* 9.5* 9.2*  HCT 44.5 32.4* 32.3* 30.1* 31.0*  MCV 81.8 81.4 82.2 82.9 85.4  PLT 209 184 176 191 182   Basic Metabolic Panel: Recent Labs  Lab 01/18/24 1232 01/18/24 1503 01/19/24 0302 01/20/24 0924 01/21/24 0604  NA 136  --  135 132* 132*  K 4.3  --  4.5 4.5 4.2  CL 101  --  101 98 97*  CO2 25  --  26 27 28   GLUCOSE 164*  --  215* 312* 240*  BUN 30*  --  27* 29* 22  CREATININE 1.57* 1.56* 1.16 1.32* 1.08  CALCIUM 9.0  --  8.6* 8.4* 8.3*   Liver Function Tests: Recent Labs  Lab 01/18/24 1232  AST 24  ALT 15  ALKPHOS 93  BILITOT 0.5  PROT 5.7*  ALBUMIN  2.9*   CBG: Recent Labs  Lab 01/20/24 1238 01/20/24 1648 01/20/24 2104 01/21/24 0736 01/21/24 1122  GLUCAP 309* 278* 166* 219* 150*    Discharge time spent: greater than 30 minutes.  Signed: Delon Herald, MD [facility] 01/21/2024

## 2024-01-21 NOTE — Discharge Instructions (Signed)
 [facility] [address]  [phone] Closed ? Opens 8?AM [facility] Mental [facility] clinic [address]  [phone] Closed ? Opens 8?AM Mon  [facility] clinic [address] [place]  [phone]

## 2024-01-22 ENCOUNTER — Telehealth: Payer: Self-pay

## 2024-01-22 DIAGNOSIS — E1142 Type 2 diabetes mellitus with diabetic polyneuropathy: Secondary | ICD-10-CM | POA: Diagnosis not present

## 2024-01-22 DIAGNOSIS — I129 Hypertensive chronic kidney disease with stage 1 through stage 4 chronic kidney disease, or unspecified chronic kidney disease: Secondary | ICD-10-CM | POA: Diagnosis not present

## 2024-01-22 DIAGNOSIS — I700 Atherosclerosis of aorta: Secondary | ICD-10-CM | POA: Diagnosis not present

## 2024-01-22 DIAGNOSIS — F0393 Unspecified dementia, unspecified severity, with mood disturbance: Secondary | ICD-10-CM | POA: Diagnosis not present

## 2024-01-22 DIAGNOSIS — L89153 Pressure ulcer of sacral region, stage 3: Secondary | ICD-10-CM | POA: Diagnosis not present

## 2024-01-22 DIAGNOSIS — E1122 Type 2 diabetes mellitus with diabetic chronic kidney disease: Secondary | ICD-10-CM | POA: Diagnosis not present

## 2024-01-22 NOTE — Transitions of Care (Post Inpatient/ED Visit) (Signed)
   01/22/2024  Name: Dustin Duffy MRN: [id number] DOB: Nov 14, 1955  Today's TOC FU Call Status: Today's TOC FU Call Status:: Unsuccessful Call (1st Attempt) Unsuccessful Call (1st Attempt) Date: 01/22/24  Attempted to reach the patient regarding the most recent Inpatient/ED visit.  Follow Up Plan: Additional outreach attempts will be made to reach the patient to complete the Transitions of Care (Post Inpatient/ED visit) call.   Emon Miggins J. Catalena Stanhope RN, MSN [facility], [facility] RN Care Manager Direct Dial: [phone]  Fax: [phone] Website: [email or web address]

## 2024-01-23 ENCOUNTER — Telehealth: Payer: Self-pay

## 2024-01-23 DIAGNOSIS — E1122 Type 2 diabetes mellitus with diabetic chronic kidney disease: Secondary | ICD-10-CM | POA: Diagnosis not present

## 2024-01-23 DIAGNOSIS — I129 Hypertensive chronic kidney disease with stage 1 through stage 4 chronic kidney disease, or unspecified chronic kidney disease: Secondary | ICD-10-CM | POA: Diagnosis not present

## 2024-01-23 DIAGNOSIS — L89153 Pressure ulcer of sacral region, stage 3: Secondary | ICD-10-CM | POA: Diagnosis not present

## 2024-01-23 DIAGNOSIS — E1142 Type 2 diabetes mellitus with diabetic polyneuropathy: Secondary | ICD-10-CM | POA: Diagnosis not present

## 2024-01-23 DIAGNOSIS — I700 Atherosclerosis of aorta: Secondary | ICD-10-CM | POA: Diagnosis not present

## 2024-01-23 DIAGNOSIS — F0393 Unspecified dementia, unspecified severity, with mood disturbance: Secondary | ICD-10-CM | POA: Diagnosis not present

## 2024-01-23 LAB — CULTURE, BLOOD (ROUTINE X 2)
Culture: NO GROWTH
Culture: NO GROWTH
Special Requests: ADEQUATE
Special Requests: ADEQUATE

## 2024-01-23 NOTE — Transitions of Care (Post Inpatient/ED Visit) (Signed)
   01/23/2024  Name: Dustin Duffy MRN: [id number] DOB: 07/15/1955  Today's TOC FU Call Status: Today's TOC FU Call Status:: Unsuccessful Call (2nd Attempt) Unsuccessful Call (2nd Attempt) Date: 01/23/24  Attempted to reach the patient regarding the most recent Inpatient/ED visit.  Follow Up Plan: Additional outreach attempts will be made to reach the patient to complete the Transitions of Care (Post Inpatient/ED visit) call.   Aliene Tamura J. Dierra Riesgo RN, MSN [facility], [facility] RN Care Manager Direct Dial: [phone]  Fax: [phone] Website: [email or web address]

## 2024-01-24 ENCOUNTER — Telehealth: Payer: Self-pay

## 2024-01-24 NOTE — Transitions of Care (Post Inpatient/ED Visit) (Addendum)
   01/24/2024  Name: RAWSON MINIX MRN: [id number] DOB: 08-Dec-1955  Today's TOC FU Call Status: Today's TOC FU Call Status:: Unsuccessful Call (3rd Attempt) Unsuccessful Call (3rd Attempt) Date: 01/24/24  Attempted to reach the patient regarding the most recent Inpatient/ED visit.  Follow Up Plan: No further outreach attempts will be made at this time. We have been unable to contact the patient.  Keeshia Sanderlin J. Kamali Sakata RN, MSN [facility], [facility] Health RN Care Manager Direct Dial: [phone]  Fax: [phone] Website: [email or web address]

## 2024-01-27 DIAGNOSIS — E1142 Type 2 diabetes mellitus with diabetic polyneuropathy: Secondary | ICD-10-CM | POA: Diagnosis not present

## 2024-01-27 DIAGNOSIS — F0393 Unspecified dementia, unspecified severity, with mood disturbance: Secondary | ICD-10-CM | POA: Diagnosis not present

## 2024-01-27 DIAGNOSIS — L89153 Pressure ulcer of sacral region, stage 3: Secondary | ICD-10-CM | POA: Diagnosis not present

## 2024-01-27 DIAGNOSIS — I700 Atherosclerosis of aorta: Secondary | ICD-10-CM | POA: Diagnosis not present

## 2024-01-27 DIAGNOSIS — E1122 Type 2 diabetes mellitus with diabetic chronic kidney disease: Secondary | ICD-10-CM | POA: Diagnosis not present

## 2024-01-27 DIAGNOSIS — I129 Hypertensive chronic kidney disease with stage 1 through stage 4 chronic kidney disease, or unspecified chronic kidney disease: Secondary | ICD-10-CM | POA: Diagnosis not present

## 2024-01-29 DIAGNOSIS — E1122 Type 2 diabetes mellitus with diabetic chronic kidney disease: Secondary | ICD-10-CM | POA: Diagnosis not present

## 2024-01-29 DIAGNOSIS — F0393 Unspecified dementia, unspecified severity, with mood disturbance: Secondary | ICD-10-CM | POA: Diagnosis not present

## 2024-01-29 DIAGNOSIS — I129 Hypertensive chronic kidney disease with stage 1 through stage 4 chronic kidney disease, or unspecified chronic kidney disease: Secondary | ICD-10-CM | POA: Diagnosis not present

## 2024-01-29 DIAGNOSIS — L89153 Pressure ulcer of sacral region, stage 3: Secondary | ICD-10-CM | POA: Diagnosis not present

## 2024-01-29 DIAGNOSIS — E1142 Type 2 diabetes mellitus with diabetic polyneuropathy: Secondary | ICD-10-CM | POA: Diagnosis not present

## 2024-01-29 DIAGNOSIS — I700 Atherosclerosis of aorta: Secondary | ICD-10-CM | POA: Diagnosis not present

## 2024-01-30 DIAGNOSIS — Z9359 Other cystostomy status: Secondary | ICD-10-CM | POA: Diagnosis not present

## 2024-01-30 DIAGNOSIS — I10 Essential (primary) hypertension: Secondary | ICD-10-CM | POA: Diagnosis not present

## 2024-01-30 DIAGNOSIS — B961 Klebsiella pneumoniae [K. pneumoniae] as the cause of diseases classified elsewhere: Secondary | ICD-10-CM | POA: Diagnosis not present

## 2024-01-30 DIAGNOSIS — Z09 Encounter for follow-up examination after completed treatment for conditions other than malignant neoplasm: Secondary | ICD-10-CM | POA: Diagnosis not present

## 2024-01-30 DIAGNOSIS — H4720 Unspecified optic atrophy: Secondary | ICD-10-CM | POA: Diagnosis not present

## 2024-02-02 DIAGNOSIS — F0393 Unspecified dementia, unspecified severity, with mood disturbance: Secondary | ICD-10-CM | POA: Diagnosis not present

## 2024-02-02 DIAGNOSIS — J189 Pneumonia, unspecified organism: Secondary | ICD-10-CM | POA: Diagnosis not present

## 2024-02-02 DIAGNOSIS — F411 Generalized anxiety disorder: Secondary | ICD-10-CM | POA: Diagnosis not present

## 2024-02-02 DIAGNOSIS — R338 Other retention of urine: Secondary | ICD-10-CM | POA: Diagnosis not present

## 2024-02-02 DIAGNOSIS — F32A Depression, unspecified: Secondary | ICD-10-CM | POA: Diagnosis not present

## 2024-02-02 DIAGNOSIS — E1142 Type 2 diabetes mellitus with diabetic polyneuropathy: Secondary | ICD-10-CM | POA: Diagnosis not present

## 2024-02-02 DIAGNOSIS — N390 Urinary tract infection, site not specified: Secondary | ICD-10-CM | POA: Diagnosis not present

## 2024-02-02 DIAGNOSIS — Z435 Encounter for attention to cystostomy: Secondary | ICD-10-CM | POA: Diagnosis not present

## 2024-02-02 DIAGNOSIS — R296 Repeated falls: Secondary | ICD-10-CM | POA: Diagnosis not present

## 2024-02-02 DIAGNOSIS — I959 Hypotension, unspecified: Secondary | ICD-10-CM | POA: Diagnosis not present

## 2024-02-02 DIAGNOSIS — F0394 Unspecified dementia, unspecified severity, with anxiety: Secondary | ICD-10-CM | POA: Diagnosis not present

## 2024-02-02 DIAGNOSIS — L89890 Pressure ulcer of other site, unstageable: Secondary | ICD-10-CM | POA: Diagnosis not present

## 2024-02-02 DIAGNOSIS — E1122 Type 2 diabetes mellitus with diabetic chronic kidney disease: Secondary | ICD-10-CM | POA: Diagnosis not present

## 2024-02-02 DIAGNOSIS — D631 Anemia in chronic kidney disease: Secondary | ICD-10-CM | POA: Diagnosis not present

## 2024-02-02 DIAGNOSIS — N138 Other obstructive and reflux uropathy: Secondary | ICD-10-CM | POA: Diagnosis not present

## 2024-02-02 DIAGNOSIS — I129 Hypertensive chronic kidney disease with stage 1 through stage 4 chronic kidney disease, or unspecified chronic kidney disease: Secondary | ICD-10-CM | POA: Diagnosis not present

## 2024-02-02 DIAGNOSIS — H548 Legal blindness, as defined in USA: Secondary | ICD-10-CM | POA: Diagnosis not present

## 2024-02-02 DIAGNOSIS — J9621 Acute and chronic respiratory failure with hypoxia: Secondary | ICD-10-CM | POA: Diagnosis not present

## 2024-02-02 DIAGNOSIS — A419 Sepsis, unspecified organism: Secondary | ICD-10-CM | POA: Diagnosis not present

## 2024-02-02 DIAGNOSIS — I700 Atherosclerosis of aorta: Secondary | ICD-10-CM | POA: Diagnosis not present

## 2024-02-02 DIAGNOSIS — I2510 Atherosclerotic heart disease of native coronary artery without angina pectoris: Secondary | ICD-10-CM | POA: Diagnosis not present

## 2024-02-02 DIAGNOSIS — S42202D Unspecified fracture of upper end of left humerus, subsequent encounter for fracture with routine healing: Secondary | ICD-10-CM | POA: Diagnosis not present

## 2024-02-02 DIAGNOSIS — N1831 Chronic kidney disease, stage 3a: Secondary | ICD-10-CM | POA: Diagnosis not present

## 2024-02-02 DIAGNOSIS — N401 Enlarged prostate with lower urinary tract symptoms: Secondary | ICD-10-CM | POA: Diagnosis not present

## 2024-02-02 DIAGNOSIS — L89153 Pressure ulcer of sacral region, stage 3: Secondary | ICD-10-CM | POA: Diagnosis not present

## 2024-02-05 DIAGNOSIS — L89153 Pressure ulcer of sacral region, stage 3: Secondary | ICD-10-CM | POA: Diagnosis not present

## 2024-02-05 DIAGNOSIS — N390 Urinary tract infection, site not specified: Secondary | ICD-10-CM | POA: Diagnosis not present

## 2024-02-05 DIAGNOSIS — A419 Sepsis, unspecified organism: Secondary | ICD-10-CM | POA: Diagnosis not present

## 2024-02-05 DIAGNOSIS — E1142 Type 2 diabetes mellitus with diabetic polyneuropathy: Secondary | ICD-10-CM | POA: Diagnosis not present

## 2024-02-05 DIAGNOSIS — I700 Atherosclerosis of aorta: Secondary | ICD-10-CM | POA: Diagnosis not present

## 2024-02-05 DIAGNOSIS — I129 Hypertensive chronic kidney disease with stage 1 through stage 4 chronic kidney disease, or unspecified chronic kidney disease: Secondary | ICD-10-CM | POA: Diagnosis not present

## 2024-02-12 DIAGNOSIS — I700 Atherosclerosis of aorta: Secondary | ICD-10-CM | POA: Diagnosis not present

## 2024-02-12 DIAGNOSIS — N390 Urinary tract infection, site not specified: Secondary | ICD-10-CM | POA: Diagnosis not present

## 2024-02-12 DIAGNOSIS — A419 Sepsis, unspecified organism: Secondary | ICD-10-CM | POA: Diagnosis not present

## 2024-02-12 DIAGNOSIS — I129 Hypertensive chronic kidney disease with stage 1 through stage 4 chronic kidney disease, or unspecified chronic kidney disease: Secondary | ICD-10-CM | POA: Diagnosis not present

## 2024-02-12 DIAGNOSIS — E1142 Type 2 diabetes mellitus with diabetic polyneuropathy: Secondary | ICD-10-CM | POA: Diagnosis not present

## 2024-02-12 DIAGNOSIS — L89153 Pressure ulcer of sacral region, stage 3: Secondary | ICD-10-CM | POA: Diagnosis not present

## 2024-02-13 ENCOUNTER — Other Ambulatory Visit: Payer: Self-pay | Admitting: *Deleted

## 2024-02-13 DIAGNOSIS — L89153 Pressure ulcer of sacral region, stage 3: Secondary | ICD-10-CM | POA: Diagnosis not present

## 2024-02-13 DIAGNOSIS — A419 Sepsis, unspecified organism: Secondary | ICD-10-CM | POA: Diagnosis not present

## 2024-02-13 DIAGNOSIS — I700 Atherosclerosis of aorta: Secondary | ICD-10-CM | POA: Diagnosis not present

## 2024-02-13 DIAGNOSIS — N390 Urinary tract infection, site not specified: Secondary | ICD-10-CM | POA: Diagnosis not present

## 2024-02-13 DIAGNOSIS — E1142 Type 2 diabetes mellitus with diabetic polyneuropathy: Secondary | ICD-10-CM | POA: Diagnosis not present

## 2024-02-13 DIAGNOSIS — I129 Hypertensive chronic kidney disease with stage 1 through stage 4 chronic kidney disease, or unspecified chronic kidney disease: Secondary | ICD-10-CM | POA: Diagnosis not present

## 2024-02-13 NOTE — Patient Outreach (Addendum)
 Complex Care Management   Visit Note  02/13/2024  Name:  Dustin Duffy MRN: [id number] DOB: 1955/05/27  Situation: Referral received for Complex Care Management related to Falls/post Sepsis. I obtained verbal consent from Patient.  Visit completed with Patient  on the phone  Background:   Past Medical History:  Diagnosis Date   BPH without urinary obstruction    Depression    Diabetes mellitus type 2 in nonobese [facility])    Dyslipidemia    Essential hypertension    Neurogenic bladder    Neuropathy    Pneumonia    Renal impairment    Visual impairment     Assessment: Recently discharged from the hospital for Septic shock-discharged home with [facility] with PT/OT/RN/Aide/SW. Pt continue to receive weekly wound care for left hip and suprapubic catheter. From procedure in July 2025 Transurethral Resection and Evacuation. Will add to his MyChart for review DASH diet and Sepsis information. Patient Reported Symptoms:  Cognitive Cognitive Status: Able to follow simple commands, Alert and oriented to person, place, and time, Normal speech and language skills   Health Maintenance Behaviors: Annual physical exam  Neurological Neurological Review of Symptoms: No symptoms reported Neurological Management Strategies: Coping strategies, Routine screening  HEENT HEENT Symptoms Reported: No symptoms reported (Pt reports legally blind with 10 % or less in total vision) HEENT Management Strategies: Routine screening Vision problem(s)  Cardiovascular Cardiovascular Symptoms Reported: No symptoms reported Does patient have uncontrolled Hypertension?: No, Yes Is patient checking Blood Pressure at home?: Yes Patient's Recent BP reading at home: Report his blood pressures was 140/80 on his home monitor. Pt remains asymptomatic. Educated on DASH and sme form exercise. Cardiovascular Management Strategies: Routine screening, Coping strategies, Medical device, Medication therapy  Respiratory  Respiratory Symptoms Reported: No symptoms reported Additional Respiratory Details: Reports he received his FLU and Covid vaccination at his provider's office Respiratory Management Strategies: Routine screening Respiratory Self-Management Outcome: 4 (good)  Endocrine Endocrine Symptoms Reported: No symptoms reported Is patient diabetic?: Yes Is patient checking blood sugars at home?: Yes List most recent blood sugar readings, include date and time of day: Reports Libre reading this morning 127 fasting and he remains asymptomatic at this time. Endocrine Self-Management Outcome: 4 (good)  Gastrointestinal Gastrointestinal Symptoms Reported: No symptoms reported Gastrointestinal Management Strategies: Medication therapy    Genitourinary Genitourinary Symptoms Reported: Other Other Genitourinary Symptoms: Suprapubic changes every month with [facility] ([facility]) PT/OT/RN/Aide/Social Worker to continue managing his ongoing care Genitourinary Management Strategies: Catheter, indwelling, Medication therapy, Medical device, Adequate rest Indwelling Catheter Inserted: 10/23/23 (Outpt procedure 10/23/2023 Transuethral Resection and Evacuation) Genitourinary Self-Management Outcome: 4 (good)  Integumentary Integumentary Symptoms Reported: Wound Additional Integumentary Details: Pt reports left hip pressure wound with ongoing dressing changes provided [facility] RN Skin Management Strategies: Routine screening, Coping strategies, Dressing changes Skin Self-Management Outcome: 4 (good)  Musculoskeletal Musculoskelatal Symptoms Reviewed: Muscle pain Additional Musculoskeletal Details: Pt reports he taken Gabapentin  with good relief. Musculoskeletal Management Strategies: Medication therapy, Routine screening, Coping strategies Musculoskeletal Self-Management Outcome: 4 (good)      Psychosocial Psychosocial Symptoms Reported: No symptoms reported Behavioral Management Strategies: Coping strategies, Support  system Behavioral Health Self-Management Outcome: 4 (good) Major Change/Loss/Stressor/Fears (CP): Medical condition, self Quality of Family Relationships: helpful, involved, supportive Do you feel physically threatened by others?: No    There were no vitals filed for this visit. Pain Scale: 0-10 Pain Score: 3  Pain Type: Chronic pain Pain Location: Leg Pain Orientation: Right Pain Descriptors / Indicators: Aching Pain Onset: On-going  Patients Stated Pain Goal: 0 Pain Intervention(s): Medication (See eMAR), Repositioned, Elevated extremity, Hot/Cold interventions Multiple Pain Sites: No  Medications Reviewed Today   Medications were not reviewed in this encounter     Recommendation:   PCP Follow-up Continue Current Plan of Care  Follow Up Plan:   Telephone follow up appointment date/time:  03/14/2024 @ 1100 am   Olam Ku, RN, BSN   [facility], [facility] RN Care Manager Direct Dial: [phone]  Fax: [phone]

## 2024-02-13 NOTE — Patient Instructions (Signed)
 Visit Information  Thank you for taking time to visit with me today. Please don't hesitate to contact me if I can be of assistance to you before our next scheduled appointment.  Your next care management appointment is by telephone on 03/14/2024 at 1100 am  Please call the care guide team at [phone] if you need to cancel, schedule, or reschedule an appointment.   Please call the Suicide and Crisis Lifeline: 988 call the [place]  National Suicide Prevention Lifeline: 4404652034 or TTY: (714) 677-2533 TTY (952)577-8100) to talk to a trained counselor call 1-800-273-TALK (toll free, 24 hour hotline) if you are experiencing a Mental Health or Behavioral Health Crisis or need someone to talk to.  Olam Ku, RN, BSN [place]  [facility], [facility] RN Care Manager Direct Dial: [phone]  Fax: [phone]

## 2024-02-14 DIAGNOSIS — F02A18 Dementia in other diseases classified elsewhere, mild, with other behavioral disturbance: Secondary | ICD-10-CM | POA: Diagnosis not present

## 2024-02-15 ENCOUNTER — Telehealth: Payer: Self-pay | Admitting: *Deleted

## 2024-02-15 NOTE — Patient Outreach (Addendum)
 Complex Care Management   Visit Note  02/15/2024  Name:  Dustin Duffy MRN: [id number] DOB: 07-Jan-1956  Situation: RNCM received a call back from with permission for the pt from his caregiver friend Quentin Daring) who organizes his daily medications. She call report today concerning pt's ongoing prescribed medications and pt's level of adherence.   All medications verified noted for the recent assessment completed for this month. Also reported pt's next follow up appointment with a provider the following day.   Olam Ku, RN, BSN [place]  [facility], [facility] RN Care Manager Direct Dial: [phone]  Fax: [phone]

## 2024-02-19 DIAGNOSIS — N390 Urinary tract infection, site not specified: Secondary | ICD-10-CM | POA: Diagnosis not present

## 2024-02-19 DIAGNOSIS — A419 Sepsis, unspecified organism: Secondary | ICD-10-CM | POA: Diagnosis not present

## 2024-02-19 DIAGNOSIS — I700 Atherosclerosis of aorta: Secondary | ICD-10-CM | POA: Diagnosis not present

## 2024-02-19 DIAGNOSIS — L89153 Pressure ulcer of sacral region, stage 3: Secondary | ICD-10-CM | POA: Diagnosis not present

## 2024-02-19 DIAGNOSIS — I129 Hypertensive chronic kidney disease with stage 1 through stage 4 chronic kidney disease, or unspecified chronic kidney disease: Secondary | ICD-10-CM | POA: Diagnosis not present

## 2024-02-19 DIAGNOSIS — E1142 Type 2 diabetes mellitus with diabetic polyneuropathy: Secondary | ICD-10-CM | POA: Diagnosis not present

## 2024-02-20 DIAGNOSIS — I129 Hypertensive chronic kidney disease with stage 1 through stage 4 chronic kidney disease, or unspecified chronic kidney disease: Secondary | ICD-10-CM | POA: Diagnosis not present

## 2024-02-20 DIAGNOSIS — N390 Urinary tract infection, site not specified: Secondary | ICD-10-CM | POA: Diagnosis not present

## 2024-02-20 DIAGNOSIS — I700 Atherosclerosis of aorta: Secondary | ICD-10-CM | POA: Diagnosis not present

## 2024-02-20 DIAGNOSIS — E1142 Type 2 diabetes mellitus with diabetic polyneuropathy: Secondary | ICD-10-CM | POA: Diagnosis not present

## 2024-02-20 DIAGNOSIS — A419 Sepsis, unspecified organism: Secondary | ICD-10-CM | POA: Diagnosis not present

## 2024-02-20 DIAGNOSIS — L89153 Pressure ulcer of sacral region, stage 3: Secondary | ICD-10-CM | POA: Diagnosis not present

## 2024-02-27 DIAGNOSIS — N390 Urinary tract infection, site not specified: Secondary | ICD-10-CM | POA: Diagnosis not present

## 2024-02-27 DIAGNOSIS — I700 Atherosclerosis of aorta: Secondary | ICD-10-CM | POA: Diagnosis not present

## 2024-02-27 DIAGNOSIS — L89153 Pressure ulcer of sacral region, stage 3: Secondary | ICD-10-CM | POA: Diagnosis not present

## 2024-02-27 DIAGNOSIS — I129 Hypertensive chronic kidney disease with stage 1 through stage 4 chronic kidney disease, or unspecified chronic kidney disease: Secondary | ICD-10-CM | POA: Diagnosis not present

## 2024-02-27 DIAGNOSIS — E1142 Type 2 diabetes mellitus with diabetic polyneuropathy: Secondary | ICD-10-CM | POA: Diagnosis not present

## 2024-02-27 DIAGNOSIS — A419 Sepsis, unspecified organism: Secondary | ICD-10-CM | POA: Diagnosis not present

## 2024-03-14 ENCOUNTER — Other Ambulatory Visit: Payer: Self-pay | Admitting: *Deleted

## 2024-03-14 ENCOUNTER — Telehealth: Payer: Self-pay | Admitting: *Deleted

## 2024-03-14 NOTE — Patient Outreach (Signed)
 Complex Care Management   Visit Note  03/14/2024  Name:  Dustin Duffy MRN: [id number] DOB: 1955-05-09  Situation: RNCM received a call back from [name] Daring (caregiver) reciting and confirm medications as pt has a pill box she continues to refill regularly. RNCM update medication adherence and review.   RNCM spoke [name] who indicated pt needs further education on diabetic dietary habits and choices. [name] indicated pt has visiting provider Brandon Jochum who continues to visit. She will reach out to this provider with her request. [name] has confirmed pt is feeling and doing a lot better and has gained some weight while recovering from his recent illness.  No other request or inquires at this time as RNCM will follow up next month as scheduled with this pt.  Olam Ku, RN, BSN [place]  [facility], [facility] RN Care Manager Direct Dial: [phone]  Fax: [phone]

## 2024-03-14 NOTE — Patient Instructions (Signed)
 Visit Information  Thank you for taking time to visit with me today. Please don't hesitate to contact me if I can be of assistance to you before our next scheduled appointment.  Your next care management appointment is by telephone on 04/12/2024 at 11:00 AM  Please call the care guide team at [phone] if you need to cancel, schedule, or reschedule an appointment.   Please call the Suicide and Crisis Lifeline: 988 call the [place]  National Suicide Prevention Lifeline: (256) 561-5894 or TTY: (315)772-3960 TTY (253)295-6035) to talk to a trained counselor call 1-800-273-TALK (toll free, 24 hour hotline) if you are experiencing a Mental Health or Behavioral Health Crisis or need someone to talk to.   Olam Ku, RN, BSN [place]  [facility], [facility] Health RN Care Manager Direct Dial: [phone]  Fax: [phone]

## 2024-03-14 NOTE — Patient Outreach (Addendum)
 Complex Care Management   Visit Note  03/14/2024  Name:  Dustin Duffy MRN: [id number] DOB: 1955-09-12  Situation: Referral received for Complex Care Management related to Fall/Sepsis I obtained verbal consent from Patient.  Visit completed with Patient  on the phone  Background:   Past Medical History:  Diagnosis Date   BPH without urinary obstruction    Depression    Diabetes mellitus type 2 in nonobese [facility])    Dyslipidemia    Essential hypertension    Neurogenic bladder    Neuropathy    Pneumonia    Renal impairment    Visual impairment     Assessment: RNCM called [name] Daring  [phone] friend of family who manages who fills pill box via pt's medication. Will review the list of medications and obtain an update on pt's adherence with all his medications. GAPS of Care were reviewed. Patient Reported Symptoms:  Cognitive Cognitive Status: Able to follow simple commands, Alert and oriented to person, place, and time, Normal speech and language skills   Health Maintenance Behaviors: Annual physical exam Healing Pattern: Slow Health Facilitated by: Healthy diet, Rest  Neurological Neurological Review of Symptoms: No symptoms reported Neurological Management Strategies: Coping strategies, Routine screening Neurological Self-Management Outcome: 4 (good)  HEENT HEENT Symptoms Reported: No symptoms reported (Pt reports legally blind with 10 % or less in total vision) HEENT Management Strategies: Medication therapy, Routine screening HEENT Self-Management Outcome: 4 (good)    Cardiovascular Cardiovascular Symptoms Reported: No symptoms reported Does patient have uncontrolled Hypertension?: Yes Is patient checking Blood Pressure at home?: Yes Patient's Recent BP reading at home: Pt reports his last reading 12/17 readings was 143/80 HR 63 and pt remains asymptomatic. Education DASH and along with some form of exercises with [facility] recommendations Cardiovascular Management  Strategies: Coping strategies, Medical device, Medication therapy, Routine screening Cardiovascular Self-Management Outcome: 4 (good)  Respiratory Respiratory Symptoms Reported: No symptoms reported Respiratory Management Strategies: Routine screening Respiratory Self-Management Outcome: 4 (good)  Endocrine Endocrine Symptoms Reported: No symptoms reported Is patient diabetic?: Yes Is patient checking blood sugars at home?: Yes List most recent blood sugar readings, include date and time of day: Reports Libre reading this morning 152 fasting reporting heavy meal the evening before. Retake during this call reports a read of 262 after breakfast and remains asymptomatic at this time. Reports his range in 64-190 glucose. Endocrine Self-Management Outcome: 4 (good)  Gastrointestinal Gastrointestinal Symptoms Reported: No symptoms reported Gastrointestinal Management Strategies: Medication therapy Gastrointestinal Self-Management Outcome: 4 (good)    Genitourinary Genitourinary Symptoms Reported: Other (suprapubic catheter remains in place) Other Genitourinary Symptoms: Suprapubic changes every month with [facility] ([facility]) Additional Genitourinary Details: Suprapubic catheter in place, had outpatient procedure 10/23/23 for Transurethral Resection and Evacuation of Fungus Ball with a Suprapubic Tube Exchange. Continues to be monitored by the urologist. Genitourinary Management Strategies: Medication therapy, Coping strategies, Adequate rest, Medical device Indwelling Catheter Inserted: 10/23/23 Genitourinary Self-Management Outcome: 4 (good)  Integumentary Integumentary Symptoms Reported: Wound Additional Integumentary Details: Pt reports left hip pressure wound with ongoing dressing changes provided [facility] RN Skin Management Strategies: Routine screening, Adequate rest, Coping strategies, Dressing changes, Medication therapy Skin Self-Management Outcome: 4 (good)  Musculoskeletal  Musculoskelatal Symptoms Reviewed: Muscle pain Additional Musculoskeletal Details: Pt reports left hip pressure wound with ongoing dressing changes provided [facility] RN Musculoskeletal Management Strategies: Adequate rest, Coping strategies, Medication therapy, Routine screening Musculoskeletal Self-Management Outcome: 4 (good) Musculoskeletal Comment: No falls reported since the last assessment encounter.   Patient at Risk for  Falls Due to: History of fall(s) Fall risk Follow up: Falls prevention discussed (use of assistive devices when needed)  Psychosocial Psychosocial Symptoms Reported: No symptoms reported Behavioral Management Strategies: Coping strategies, Support system (Has assistance from his niece and [name] Daring friend of the family)        There were no vitals filed for this visit. Pain Scale: 0-10 Pain Score: 3  Pain Type: Chronic pain Pain Location: Leg Pain Orientation: Right Pain Descriptors / Indicators: Aching Pain Onset: On-going Patients Stated Pain Goal: 0 Pain Intervention(s): Medication (See eMAR), Repositioned, Rest Multiple Pain Sites: No  Medications Reviewed Today   Medications were not reviewed in this encounter     Recommendation:   PCP Follow-up Continue Current Plan of Care  Follow Up Plan:   Telephone follow up appointment date/time:  04/12/2024 11:00 AM   Olam Ku, RN, BSN [place]  [facility], [facility] Health RN Care Manager Direct Dial: [phone]  Fax: [phone]

## 2024-04-12 ENCOUNTER — Telehealth: Payer: Self-pay

## 2024-04-12 ENCOUNTER — Other Ambulatory Visit: Payer: Self-pay

## 2024-04-12 NOTE — Patient Outreach (Signed)
 Received call from [name] Daring who has read visit note from earlier today with Mr. Grabel. She wanted to update this RNCM on patients current state. She reports his early dementia is causing some personality changes and that he may not 100% truthful with this RNCM. He has continued to have falls, he still has wounds on his feet with [facility] RN coming to change dressings and he is eating a lot of food including foods he knows are not healthy for him. She only buys healthy items but he will have others go buy sweets and other junk foods. She also reports the [facility] agency has him on a Palliative program. [name] assured me that his doctors are aware of his situation. Thanked her for this update and will be cognizant of this during future calls. [name] has welcomed me to call her with any questions or concerns regarding Mr. Crewe and I encouraged her to call me any time as well if I can be of assistance.   Elida Pulse, RNCM Case Manager [facility], Population Health Direct Dial: [phone]

## 2024-04-12 NOTE — Patient Instructions (Signed)
 Visit Information  Thank you for taking time to visit with me today. Please don't hesitate to contact me if I can be of assistance to you before our next scheduled appointment.  Your next care management appointment is by telephone on 05/09/2024 at 2:00 pm   Please call the care guide team at [phone] if you need to cancel, schedule, or reschedule an appointment.   Please call the [place]  National Suicide Prevention Lifeline: (972) 180-7341 or TTY: 915-704-6808 TTY 303-571-3341) to talk to a trained counselor if you are experiencing a Mental Health or Behavioral Health Crisis or need someone to talk to.  Elida Pulse, RNCM Case Manager [facility], Population Health Direct Dial: [phone]

## 2024-04-12 NOTE — Patient Outreach (Signed)
 Complex Care Management   Visit Note  04/12/2024  Name:  Dustin Duffy MRN: [id number] DOB: [birth year]  Situation: Referral received for Complex Care Management related to Falls/Sepsis I obtained verbal consent from Patient.  Visit completed with Dustin Duffy  on the phone  Background:   Past Medical History:  Diagnosis Date   BPH without urinary obstruction    Depression    Diabetes mellitus type 2 in nonobese [facility])    Dyslipidemia    Essential hypertension    Neurogenic bladder    Neuropathy    Pneumonia    Renal impairment    Visual impairment     Assessment: Patient Reported Symptoms:  Cognitive Cognitive Status: No symptoms reported, Alert and oriented to person, place, and time, Insightful and able to interpret abstract concepts, Normal speech and language skills Cognitive/Intellectual Conditions Management [RPT]: None reported or documented in medical history or problem list   Health Maintenance Behaviors: Annual physical exam, Exercise Health Facilitated by: Rest  Neurological Neurological Review of Symptoms: No symptoms reported Neurological Management Strategies: Exercise, Adequate rest, Routine screening Neurological Self-Management Outcome: 4 (good)  HEENT HEENT Symptoms Reported: Not assessed      Cardiovascular Cardiovascular Symptoms Reported: No symptoms reported Does patient have uncontrolled Hypertension?: Yes Is patient checking Blood Pressure at home?: Yes Cardiovascular Management Strategies: Medical device, Exercise Cardiovascular Self-Management Outcome: 4 (good) Cardiovascular Comment: Patient has increased exercise regimine and feels he is getting stronger  Respiratory Respiratory Symptoms Reported: No symptoms reported Respiratory Management Strategies: Adequate rest, Exercise, Routine screening Respiratory Self-Management Outcome: 4 (good)  Endocrine Endocrine Symptoms Reported: No symptoms reported Is patient diabetic?: Yes Is patient checking  blood sugars at home?: Yes List most recent blood sugar readings, include date and time of day: Did not give numbers today, just said they have been high because he eats what he wants. he is increasing his exercise and is open to receiving some Diabetic diet education literature via MyChart Endocrine Self-Management Outcome: 3 (uncertain)  Gastrointestinal Gastrointestinal Symptoms Reported: No symptoms reported Gastrointestinal Management Strategies: Medication therapy, Exercise Gastrointestinal Self-Management Outcome: 4 (good)    Genitourinary Genitourinary Symptoms Reported: Other Other Genitourinary Symptoms: Suprapubic cath changed by home health RN monthly. Genitourinary Management Strategies: Medical device, Adequate rest, Coping strategies, Medication therapy Genitourinary Self-Management Outcome: 4 (good)  Integumentary Integumentary Symptoms Reported: No symptoms reported Additional Integumentary Details: Reports all wounds are healed and wound RN is no longer coming todue to no dressings needed. Skin Management Strategies: Routine screening, Exercise, Adequate rest, Coping strategies Skin Self-Management Outcome: 4 (good)  Musculoskeletal Musculoskelatal Symptoms Reviewed: Not assessed        Psychosocial Psychosocial Symptoms Reported: No symptoms reported          04/12/2024    PHQ2-9 Depression Screening   Little interest or pleasure in doing things    Feeling down, depressed, or hopeless    PHQ-2 - Total Score    Trouble falling or staying asleep, or sleeping too much    Feeling tired or having little energy    Poor appetite or overeating     Feeling bad about yourself - or that you are a failure or have let yourself or your family down    Trouble concentrating on things, such as reading the newspaper or watching television    Moving or speaking so slowly that other people could have noticed.  Or the opposite - being so fidgety or restless that you have been moving  around a lot more  than usual    Thoughts that you would be better off dead, or hurting yourself in some way    PHQ2-9 Total Score    If you checked off any problems, how difficult have these problems made it for you to do your work, take care of things at home, or get along with other people    Depression Interventions/Treatment      There were no vitals filed for this visit. Pain Scale: 0-10 Pain Score: 2  Pain Type: Chronic pain Pain Location: Leg Pain Orientation: Right Pain Descriptors / Indicators: Aching Pain Onset: On-going Pain Intervention(s): Medication (See eMAR), Rest, Other (Comment) (is increasing leg exercises and feeling stronger) Multiple Pain Sites: No  Medications Reviewed Today   Medications were not reviewed in this encounter     Recommendation:   Continue Current Plan of Care Review Written material and videos sent via MyChart: Diabetes: Health Eating for Adults, Diabetes: Carbohydrate Counting, VIDEO: Diabetes Mellitus and Nutrition, and Understanding your Risk for Falls  Follow Up Plan:   Telephone follow up appointment date/time:  05/09/2024 @ 2:00 pm  Elida Pulse, RNCM Case Manager [facility], Population Health Direct Dial: [phone]

## 2024-05-09 ENCOUNTER — Telehealth
